# Patient Record
Sex: Male | Born: 1960
Health system: Southern US, Community
[De-identification: ages and names within clinical notes are randomized; demographics above are authoritative.]

## PROBLEM LIST (undated history)

## (undated) DIAGNOSIS — R011 Cardiac murmur, unspecified: Secondary | ICD-10-CM

## (undated) DIAGNOSIS — I10 Essential (primary) hypertension: Secondary | ICD-10-CM

## (undated) DIAGNOSIS — M1990 Unspecified osteoarthritis, unspecified site: Secondary | ICD-10-CM

## (undated) DIAGNOSIS — I4510 Unspecified right bundle-branch block: Secondary | ICD-10-CM

## (undated) DIAGNOSIS — N200 Calculus of kidney: Secondary | ICD-10-CM

## (undated) DIAGNOSIS — I219 Acute myocardial infarction, unspecified: Secondary | ICD-10-CM

## (undated) DIAGNOSIS — Z8719 Personal history of other diseases of the digestive system: Secondary | ICD-10-CM

## (undated) DIAGNOSIS — IMO0002 Reserved for concepts with insufficient information to code with codable children: Secondary | ICD-10-CM

## (undated) DIAGNOSIS — D352 Benign neoplasm of pituitary gland: Secondary | ICD-10-CM

## (undated) DIAGNOSIS — J45909 Unspecified asthma, uncomplicated: Secondary | ICD-10-CM

## (undated) DIAGNOSIS — R41841 Cognitive communication deficit: Secondary | ICD-10-CM

## (undated) DIAGNOSIS — K644 Residual hemorrhoidal skin tags: Secondary | ICD-10-CM

## (undated) DIAGNOSIS — K219 Gastro-esophageal reflux disease without esophagitis: Secondary | ICD-10-CM

## (undated) DIAGNOSIS — G43909 Migraine, unspecified, not intractable, without status migrainosus: Secondary | ICD-10-CM

## (undated) DIAGNOSIS — E785 Hyperlipidemia, unspecified: Secondary | ICD-10-CM

## (undated) DIAGNOSIS — D497 Neoplasm of unspecified behavior of endocrine glands and other parts of nervous system: Secondary | ICD-10-CM

## (undated) DIAGNOSIS — D369 Benign neoplasm, unspecified site: Secondary | ICD-10-CM

## (undated) DIAGNOSIS — T7840XA Allergy, unspecified, initial encounter: Secondary | ICD-10-CM

## (undated) DIAGNOSIS — K449 Diaphragmatic hernia without obstruction or gangrene: Secondary | ICD-10-CM

## (undated) DIAGNOSIS — I2510 Atherosclerotic heart disease of native coronary artery without angina pectoris: Secondary | ICD-10-CM

## (undated) DIAGNOSIS — K529 Noninfective gastroenteritis and colitis, unspecified: Secondary | ICD-10-CM

## (undated) DIAGNOSIS — K5790 Diverticulosis of intestine, part unspecified, without perforation or abscess without bleeding: Secondary | ICD-10-CM

## (undated) HISTORY — DX: Benign neoplasm of pituitary gland: D35.2

## (undated) HISTORY — DX: Acute myocardial infarction, unspecified: I21.9

## (undated) HISTORY — DX: Unspecified right bundle-branch block: I45.10

## (undated) HISTORY — PX: HAMMER TOE SURGERY: SHX385

## (undated) HISTORY — PX: HERNIA REPAIR: SHX51

## (undated) HISTORY — PX: OTHER SURGICAL HISTORY: SHX169

## (undated) HISTORY — DX: Personal history of other diseases of the digestive system: Z87.19

## (undated) HISTORY — DX: Diverticulosis of intestine, part unspecified, without perforation or abscess without bleeding: K57.90

## (undated) HISTORY — DX: Reserved for concepts with insufficient information to code with codable children: IMO0002

## (undated) HISTORY — DX: Gastro-esophageal reflux disease without esophagitis: K21.9

## (undated) HISTORY — DX: Diaphragmatic hernia without obstruction or gangrene: K44.9

## (undated) HISTORY — DX: Atherosclerotic heart disease of native coronary artery without angina pectoris: I25.10

## (undated) HISTORY — DX: Essential (primary) hypertension: I10

## (undated) HISTORY — PX: UPPER GASTROINTESTINAL ENDOSCOPY: SHX188

## (undated) HISTORY — DX: Unspecified asthma, uncomplicated: J45.909

## (undated) HISTORY — DX: Noninfective gastroenteritis and colitis, unspecified: K52.9

## (undated) HISTORY — DX: Migraine, unspecified, not intractable, without status migrainosus: G43.909

## (undated) HISTORY — DX: Calculus of kidney: N20.0

## (undated) HISTORY — DX: Hyperlipidemia, unspecified: E78.5

## (undated) HISTORY — DX: Benign neoplasm, unspecified site: D36.9

## (undated) HISTORY — DX: Cardiac murmur, unspecified: R01.1

## (undated) HISTORY — DX: Unspecified osteoarthritis, unspecified site: M19.90

## (undated) HISTORY — DX: Cognitive communication deficit: R41.841

## (undated) HISTORY — PX: TOOTH EXTRACTION: SUR596

## (undated) HISTORY — PX: SIGMOIDOSCOPY: SUR1295

## (undated) HISTORY — DX: Allergy, unspecified, initial encounter: T78.40XA

## (undated) HISTORY — PX: COLONOSCOPY: SHX174

## (undated) HISTORY — DX: Residual hemorrhoidal skin tags: K64.4

## (undated) HISTORY — DX: Neoplasm of unspecified behavior of endocrine glands and other parts of nervous system: D49.7

---

## 1998-11-05 ENCOUNTER — Emergency Department (HOSPITAL_COMMUNITY): Admission: EM | Admit: 1998-11-05 | Discharge: 1998-11-05 | Payer: Self-pay | Admitting: Emergency Medicine

## 1998-11-05 ENCOUNTER — Encounter: Payer: Self-pay | Admitting: Emergency Medicine

## 1999-11-09 ENCOUNTER — Emergency Department (HOSPITAL_COMMUNITY): Admission: EM | Admit: 1999-11-09 | Discharge: 1999-11-09 | Payer: Self-pay | Admitting: Emergency Medicine

## 2001-07-21 ENCOUNTER — Emergency Department (HOSPITAL_COMMUNITY): Admission: EM | Admit: 2001-07-21 | Discharge: 2001-07-21 | Payer: Self-pay | Admitting: Emergency Medicine

## 2001-07-21 ENCOUNTER — Encounter: Payer: Self-pay | Admitting: Emergency Medicine

## 2001-12-05 ENCOUNTER — Encounter: Payer: Self-pay | Admitting: Urology

## 2001-12-05 ENCOUNTER — Encounter: Admission: RE | Admit: 2001-12-05 | Discharge: 2001-12-05 | Payer: Self-pay | Admitting: Urology

## 2002-07-09 ENCOUNTER — Inpatient Hospital Stay (HOSPITAL_COMMUNITY): Admission: AD | Admit: 2002-07-09 | Discharge: 2002-07-10 | Payer: Self-pay | Admitting: *Deleted

## 2002-07-09 ENCOUNTER — Encounter: Payer: Self-pay | Admitting: *Deleted

## 2003-02-28 ENCOUNTER — Encounter: Payer: Self-pay | Admitting: Family Medicine

## 2003-02-28 ENCOUNTER — Encounter: Admission: RE | Admit: 2003-02-28 | Discharge: 2003-02-28 | Payer: Self-pay | Admitting: Family Medicine

## 2003-12-06 ENCOUNTER — Inpatient Hospital Stay (HOSPITAL_COMMUNITY): Admission: EM | Admit: 2003-12-06 | Discharge: 2003-12-07 | Payer: Self-pay | Admitting: Emergency Medicine

## 2004-06-18 ENCOUNTER — Encounter: Admission: RE | Admit: 2004-06-18 | Discharge: 2004-06-18 | Payer: Self-pay | Admitting: Family Medicine

## 2004-06-25 ENCOUNTER — Encounter: Admission: RE | Admit: 2004-06-25 | Discharge: 2004-06-25 | Payer: Self-pay | Admitting: Family Medicine

## 2004-11-15 ENCOUNTER — Encounter: Admission: RE | Admit: 2004-11-15 | Discharge: 2004-11-15 | Payer: Self-pay | Admitting: Family Medicine

## 2005-01-26 ENCOUNTER — Ambulatory Visit (HOSPITAL_COMMUNITY): Admission: RE | Admit: 2005-01-26 | Discharge: 2005-01-26 | Payer: Self-pay | Admitting: Endocrinology

## 2007-01-13 ENCOUNTER — Encounter: Admission: RE | Admit: 2007-01-13 | Discharge: 2007-01-13 | Payer: Self-pay | Admitting: Endocrinology

## 2007-10-17 ENCOUNTER — Observation Stay (HOSPITAL_COMMUNITY): Admission: EM | Admit: 2007-10-17 | Discharge: 2007-10-18 | Payer: Self-pay | Admitting: Emergency Medicine

## 2011-03-03 ENCOUNTER — Emergency Department (HOSPITAL_COMMUNITY): Payer: BC Managed Care – PPO

## 2011-03-03 ENCOUNTER — Observation Stay (HOSPITAL_COMMUNITY)
Admission: EM | Admit: 2011-03-03 | Discharge: 2011-03-05 | DRG: 813 | Disposition: A | Discharge status: Home / Self Care | Payer: BC Managed Care – PPO | Attending: Family Medicine | Admitting: Family Medicine

## 2011-03-03 DIAGNOSIS — K5289 Other specified noninfective gastroenteritis and colitis: Secondary | ICD-10-CM | POA: Insufficient documentation

## 2011-03-03 DIAGNOSIS — K633 Ulcer of intestine: Secondary | ICD-10-CM | POA: Insufficient documentation

## 2011-03-03 DIAGNOSIS — R109 Unspecified abdominal pain: Secondary | ICD-10-CM | POA: Insufficient documentation

## 2011-03-03 DIAGNOSIS — R197 Diarrhea, unspecified: Secondary | ICD-10-CM

## 2011-03-03 DIAGNOSIS — K2980 Duodenitis without bleeding: Principal | ICD-10-CM | POA: Insufficient documentation

## 2011-03-03 DIAGNOSIS — K922 Gastrointestinal hemorrhage, unspecified: Secondary | ICD-10-CM

## 2011-03-03 DIAGNOSIS — R51 Headache: Secondary | ICD-10-CM

## 2011-03-03 DIAGNOSIS — K449 Diaphragmatic hernia without obstruction or gangrene: Secondary | ICD-10-CM | POA: Insufficient documentation

## 2011-03-03 DIAGNOSIS — G43909 Migraine, unspecified, not intractable, without status migrainosus: Secondary | ICD-10-CM | POA: Insufficient documentation

## 2011-03-03 DIAGNOSIS — K649 Unspecified hemorrhoids: Secondary | ICD-10-CM | POA: Insufficient documentation

## 2011-03-03 DIAGNOSIS — K921 Melena: Secondary | ICD-10-CM | POA: Insufficient documentation

## 2011-03-03 DIAGNOSIS — Z01812 Encounter for preprocedural laboratory examination: Secondary | ICD-10-CM | POA: Insufficient documentation

## 2011-03-03 LAB — TYPE AND SCREEN: Antibody Screen: NEGATIVE

## 2011-03-03 LAB — ABO/RH: ABO/RH(D): A POS

## 2011-03-03 LAB — PROTIME-INR: Prothrombin Time: 12.9 seconds (ref 11.6–15.2)

## 2011-03-03 LAB — COMPREHENSIVE METABOLIC PANEL
Alkaline Phosphatase: 57 U/L (ref 39–117)
CO2: 27 mEq/L (ref 19–32)
Calcium: 9.3 mg/dL (ref 8.4–10.5)
GFR calc Af Amer: >60 mL/min (ref >60)
GFR calc non Af Amer: >60 mL/min (ref >60)
Sodium: 134 mEq/L — ABNORMAL LOW (ref 135–145)
Total Bilirubin: 0.3 mg/dL (ref 0.3–1.2)
Total Protein: 6.6 g/dL (ref 6.0–8.3)

## 2011-03-03 LAB — DIFFERENTIAL
Basophils Relative: 1 % (ref 0–1)
Monocytes Absolute: 1.2 10*3/uL — ABNORMAL HIGH (ref 0.1–1.0)
Monocytes Relative: 16 % — ABNORMAL HIGH (ref 3–12)
Neutro Abs: 4.7 10*3/uL (ref 1.7–7.7)

## 2011-03-03 LAB — CBC
Hemoglobin: 15.6 g/dL (ref 13.0–17.0)
MCH: 32.8 pg (ref 26.0–34.0)
MCHC: 36.3 g/dL — ABNORMAL HIGH (ref 30.0–36.0)

## 2011-03-03 MED ORDER — IOHEXOL 300 MG/ML  SOLN
100.0000 mL | Freq: Once | INTRAMUSCULAR | Status: AC | PRN
  Administered 2011-03-03: 100 mL via INTRAVENOUS

## 2011-03-04 LAB — CBC
MCHC: 34.5 g/dL (ref 30.0–36.0)
Platelets: 200 10*3/uL (ref 150–400)
RDW: 12.9 % (ref 11.5–15.5)
WBC: 6.3 10*3/uL (ref 4.0–10.5)

## 2011-03-04 LAB — URINALYSIS, ROUTINE W REFLEX MICROSCOPIC
Bilirubin Urine: NEGATIVE
Glucose, UA: NEGATIVE mg/dL
Nitrite: NEGATIVE
Specific Gravity, Urine: 1.02 (ref 1.005–1.030)
pH: 7 (ref 5.0–8.0)

## 2011-03-04 LAB — HEMOCCULT GUIAC POC 1CARD (OFFICE)
Fecal Occult Bld: POSITIVE
Fecal Occult Bld: POSITIVE

## 2011-03-04 LAB — COMPREHENSIVE METABOLIC PANEL
ALT: 34 U/L (ref 0–53)
Albumin: 2.9 g/dL — ABNORMAL LOW (ref 3.5–5.2)
Calcium: 9 mg/dL (ref 8.4–10.5)
GFR calc Af Amer: >60 mL/min (ref >60)
Glucose, Bld: 100 mg/dL — ABNORMAL HIGH (ref 70–99)
Sodium: 135 mEq/L (ref 135–145)
Total Protein: 6.2 g/dL (ref 6.0–8.3)

## 2011-03-04 LAB — SEDIMENTATION RATE: Sed Rate: 23 mm/hr — ABNORMAL HIGH (ref 0–16)

## 2011-03-04 LAB — CLOSTRIDIUM DIFFICILE BY PCR: Toxigenic C. Difficile by PCR: NEGATIVE

## 2011-03-05 ENCOUNTER — Other Ambulatory Visit: Payer: Self-pay | Admitting: Gastroenterology

## 2011-03-05 HISTORY — PX: FLEXIBLE SIGMOIDOSCOPY: SHX1649

## 2011-03-05 HISTORY — PX: OTHER SURGICAL HISTORY: SHX169

## 2011-03-05 LAB — BASIC METABOLIC PANEL
Chloride: 101 mEq/L (ref 96–112)
Creatinine, Ser: 0.68 mg/dL (ref 0.4–1.5)
GFR calc Af Amer: >60 mL/min (ref >60)
GFR calc non Af Amer: >60 mL/min (ref >60)
Potassium: 3.7 mEq/L (ref 3.5–5.1)

## 2011-03-05 LAB — CBC
HCT: 41 % (ref 39.0–52.0)
MCH: 30.9 pg (ref 26.0–34.0)
MCHC: 34.1 g/dL (ref 30.0–36.0)
MCV: 90.5 fL (ref 78.0–100.0)
Platelets: 215 10*3/uL (ref 150–400)
RBC: 4.43 MIL/uL (ref 4.22–5.81)
RDW: 12.6 % (ref 11.5–15.5)
WBC: 8.2 10*3/uL (ref 4.0–10.5)

## 2011-03-05 LAB — POCT I-STAT, CHEM 8
BUN: 9 mg/dL (ref 6–23)
Creatinine, Ser: 0.9 mg/dL (ref 0.4–1.5)
Hemoglobin: 15.6 g/dL (ref 13.0–17.0)
Potassium: 3.7 mEq/L (ref 3.5–5.1)
Sodium: 135 mEq/L (ref 135–145)

## 2011-03-07 LAB — STOOL CULTURE

## 2011-03-16 NOTE — H&P (Signed)
NAME:  Michael Pearson, Michael Pearson NO.:  [id number]   MEDICAL RECORD NO.:  [id number]          PATIENT TYPE:  EMS   LOCATION:  MAJO                         FACILITY:  [facility]   PHYSICIAN:  Hollice Espy, M.D.DATE OF BIRTH:  November 13, 1960   DATE OF ADMISSION:  10/17/2007  DATE OF DISCHARGE:                              HISTORY & PHYSICAL   PRIMARY CARE PHYSICIAN:  Joycelyn Rua, M.D.   CHIEF COMPLAINT:  Headache and right arm weakness.   HISTORY OF PRESENT ILLNESS:  Patient is a 50 year old white male with a  past medical history of obesity and hypertension who states he has been  in previously good health, taking his medications.  Blood pressure well  controlled.  Starting yesterday, he had an episode of a headache as well  as some chest pain.  The chest pain was located mid sternal, described  as sharp and stabbing.  He had some associated right arm numbness and  mild weakness.  He said after a few minutes, it went away, so he did not  come in and have it treated.  Today he woke up this morning, had more  chest pain, sharp and stabbing, and continued to have this right arm  numbness and weakness.  The chest pain stabbing went away, but his arm  pain persisted.  He has also noted intermittent headaches.  The  headaches have been described specifically, as he gets them starting on  the right side of his head, traveling to the front.  He can feel them  mostly in the frontal area.  One time he had an episode of blood coming  from his right ear.  He has also, according to his brother and the  patient, whenever he gets headaches, the sclerae of his eyes turn cherry  red.  In addition, he notes no other neurological symptoms, no other  extremity weakness or numbness.  He denies any visual changes at this  time.  No dysphagia.  No palpitations, no shortness of breath, wheeze,  cough, no abdominal pain, no hematuria, dysuria, constipation, or  diarrhea.  No other focal extremity  numbness, weakness, or pain.  Review  of systems otherwise negative.   Patient had a CT scan of his head, EKG, and lab work done, all within  normal limits.   Blood pressure on admission was 174/82 and has remained stable with his  diastolic actually dropping down to 69.   Patient's past medical history includes hypertension.  He had a negative  cardiac cath done two years ago.  He has had a full, extensive headache  workup, and he said he had an exposure, which required hospitalization  to chemical battery acid and has had headaches ever since, and this was  several years ago.   MEDICATIONS:  1. Atenolol/chlorthalidone 1.5 pills p.o. daily.  2. Aspirin p.o. daily.   He has no known drug allergies.   SOCIAL HISTORY:  No tobacco, alcohol, or drug use.   FAMILY HISTORY:  Noncontributory.   PHYSICAL EXAMINATION:  VITALS ON ADMISSION:  Temp 98.1, heart rate 58,  blood pressure 124/82, now down to 111/69, respirations 22, O2 sat 97%  on 2 liters.  GENERAL:  He is alert and oriented in no apparent distress.  HEENT:  Normocephalic and atraumatic.  His mucous membranes are slightly  dry.  He has no carotid bruits.  His cranial nerves II-XII are intact;  however, he has difficulty shrugging his shoulders, which appears to be  more subjective than anything else.  It is more bilateral.  HEART:  Regular rate and rhythm.  S1 and S2.  LUNGS:  Clear to auscultation bilaterally.  ABDOMEN:  Soft, nontender, nondistended.  Positive bowel sounds.  EXTREMITIES:  No clubbing, cyanosis or edema.  NEUROLOGIC/MUSCULOSKELETAL:  He has no evidence of any cerebellar  dysfunction.  His coordination is excellent and symmetric.  He has  bilateral upgoing toes but appears to be the whole foot rather than just  specifically the toes, in terms of Babinski's sign.  His sensation is  fully intact on both upper and lower extremities.  In terms of grip, he  is gripping a 5/5 on the left.  It appears to be a  -4/5 but again, more  subjective than anything else.  When asked to point, in terms of flexion  and extension, again, he has significant weakness on the right-hand  side; however, this is only in exam strength testing.  When I asked him  to point to where his headache is, he is easily able to without any type  of effort, point to using his right hand.  His lower extremities are  fully symmetric, intact, and equal.   LAB WORK:  CT scan of the head and chest x-ray are completely normal.   Sodium 141, potassium 2.9, chloride 102, bicarb 28, BUN 14, creatinine  0.8, glucose 94.  CBC is pending.  UA negative.  Cardiac markers are  normal.   EKG:  Normal.   ASSESSMENT:  1. Left arm weakness.  2. Chest pain.  3. Headache.  4. Hypertension.  5. Hypokalemia.   PLAN:  1. This exam is completely inconsistent with a TIA or CVA, given that      all of his complaints are on the right-hand side.  He has      completely normal blood pressure.  He has no signs of any permanent      dysfunction.  I suspect that he is either having a migraine with      aura or this may be a conversion disorder.  We will plan and treat      with IV fluids, Tylenol for pain control, observation, and      discharge in the morning.  I do not see any benefit from getting a      CT scan, as again, he is having no consistent neurological      dysfunction.  2. Chest pain:  He had a negative cardiac catheterization two years      ago and normal EKG and enzymes.  No reason to suspect he has any      acute disease.  3. Hypertension:  Currently, his blood pressure is excellent on      admission.  Discontinue outpatient medication.  4. Hypokalemia:  Will replace.      Hollice Espy, M.D.  Electronically Signed    SKK/MEDQ  D:  10/17/2007  T:  10/17/2007  Job:  [id number]   cc:   Joycelyn Rua, M.D.

## 2011-03-17 NOTE — Discharge Summary (Signed)
NAME:  Michael Pearson, Michael Pearson NO.:  [id number]  MEDICAL RECORD NO.:  [id number]           PATIENT TYPE:  E  LOCATION:  [facility]                         FACILITY:  [facility]  PHYSICIAN:  Nestor Ramp, MD        DATE OF BIRTH:  [birth year]  DATE OF ADMISSION:  03/03/2011 DATE OF DISCHARGE:  03/03/2011                              DISCHARGE SUMMARY   PRIMARY CARE PROVIDER:  [facility].  REASON FOR HOSPITALIZATION:  Diarrhea, Hemoccult positive stools and evidence of pancolitis on CT of abdomen and pelvis.  DISCHARGE DIAGNOSES: 1. Mild duodenitis. 2. Colitis with scattered ulcerations. 3. Migraine headache.  CONSULTS:  Jordan Hawks. Elnoria Howard, MD, gastroenterologist with [facility].  PROCEDURES: 1. EGD on Mar 05, 2011 showed a mild hiatal hernia and mild duodenitis. 2. Flexible sigmoidoscopy on Mar 05, 2011 showed colitis with scattered     ulcerations and median hemorrhoids. 3. CT of abdomen and pelvis showed pancolitis in the pattern that is     typical of pseudomembranous colitis, although ulcerative colitis     and other infectious colitis may also have a similar appearance.  PERTINENT LABS:  Hemoglobin on admission 15.6, on day #1 14.2 and on day #2 14.0, white blood count on admission 7.3.  Hemoccult positive. Creatinine 0.98 on admission and 0.61 on day #1.  C. diff PCR negative.  BRIEF HOSPITAL COURSE:  This is a 50 year old male with a history of atypical migraines without aura and medication overdose headaches and status post hiatal hernia repair as a child who presents with diarrhea, Hemoccult positive stools, headache, and a CT showing pancolitis. 1. Diarrhea, Hemoccult positive stools were likely secondary to     duodenitis and colitis with ulcerations as shown on EGD and     sigmoidoscopy.  The patient presented with his presenting symptoms,     which he has had for the past several days.  On speaking with his     primary care provider at  [facility], the patient had come     in with similar episode of diarrhea about a month ago that resolved     spontaneously with conservative management.  In March 2011     colonoscopy done by Dr. Elnoria Howard at [facility] was     abnormal for diverticulosis, sessile polyps, and medium     hemorrhoids.  Dr. Elnoria Howard was asked to consult on the patient and he     performed an EGD and flexible sigmoidoscopy on Mar 05, 2011, which     was significant for mild duodenitis and colitis with scattered     ulcerations.  For the duodenitis, the patient was put on high-dose     proton pump inhibitors and was recommended to have H.  pylori     testing as an outpatient.  Regarding the colitis with ulcerations,     biopsies were obtained and the patient was asked to follow up on     these biopsies as an outpatient with Dr. Elnoria Howard.  During his     hospitalization, the patient was  also put on a 5-day course of     prednisone for possible inflammatory colitis.  The patient's     symptoms of diarrhea improved during his hospitalization.  The    patient was initially made n.p.o. and then started on the clear     diet and his diet was advanced as tolerated.  At the time of     discharge, the patient was tolerating a regular diet.  The patient     was initially put on ciprofloxacin and Flagyl due to concern for     bacterial gastroenteritis.  The Flagyl was discontinued when the C.     diff PCR came back negative.  The ciprofloxacin was also     discontinued following the results of the patient's scope studies. 2. Headaches.  The patient was complaining of worsening headache on     admission.  The patient has been worked up extensively for     headaches in the past.  Speaking with his PCP, the patient sees a     physician at [facility] who was diagnosed the     patient with atypical migraines without aura and medication     overdose headaches.  These are unclear what the  patient takes as an     outpatient for his headaches.  The patient is a poor historian and     may have baseline mental retardation and may have high-functioning     mental retardation.  In the hospital, the headaches were alleviated     with p.r.n. Vicodin, which the patient was only requiring about     once daily.  According to his PCP, the patient was initially taking     topiramate and Flexeril for his headaches, but the patient denies     taking anything at home for his headaches besides occasional Advil     and Goody's Powder.  The patient's headache at the time of     discharge was minimal.  MEDICATIONS:  New medications: 1. Acetaminophen 650 mg p.o. b.i.d. p.r.n. headaches. 2. Protonix 40 mg p.o. b.i.d. 3. Prednisone taper to be continued for four more days, two tablets x2     days, one tablet x2 days, and discontinue. 4. Topiramate 100 mg p.o. daily. 5. Multivitamins.  DISCONTINUED MEDICATIONS:  Aspirin 81 mg p.o. daily.  DISCHARGE INSTRUCTIONS:  The patient was asked to follow up with his PCP at [facility] in 2-3 weeks.  The patient was asked to make an appointment.  The patient was also asked to follow up with Dr. Elnoria Howard at [facility] who will call the patient to arrange follow up with him.  The patient was also asked to discontinue his aspirin and discontinued NSAIDs for the treatment of headaches in light of his duodenitis.  The patient was asked to resume a normal diet..  The patient was discharged home in stable medical condition.    ______________________________ Michael Mann, MD   ______________________________ Nestor Ramp, MD    AO/MEDQ  D:  03/06/2011  T:  03/07/2011  Job:  [id number]  Electronically Signed by Michael Mann MD on 03/16/2011 09:06:46 PM Electronically Signed by Michael Levy MD on 03/17/2011 04:36:00 PM

## 2011-03-19 NOTE — H&P (Signed)
NAME:  Michael Pearson, Michael Pearson                             ACCOUNT NO.:  [id number]   MEDICAL RECORD NO.:  [id number]                   PATIENT TYPE:  INP   LOCATION:  6524                                 FACILITY:  [facility]   PHYSICIAN:  Jackie Plum, M.D.             DATE OF BIRTH:  1961/10/05   DATE OF ADMISSION:  12/06/2003  DATE OF DISCHARGE:                                HISTORY & PHYSICAL   CHIEF COMPLAINT:  Severe headaches.   HISTORY OF PRESENT ILLNESS:  The patient is a 50 year old Caucasian  gentleman with a history of hypertension and dyslipidemia, who presented to  the ED last evening with severe headache while watching TV.  Headache was  graded 10/10 and was said to the be the worst of his life.  It was  associated with nausea and vomiting x2.  He has had similar headaches which  have been more recurrent over the last 6 months.  He had apparently been  diagnosed with migraine headaches.  Headaches were mainly in his right  frontal and parietal and temporal areas.  They were said to be sharp and  associated with some form of mild photophobia with flashing of his eyes and  clutching involving his right upper face.  According to the family, per ED  P.A. who saw the patient, the patient's right eye was deviated at the time  of the headache and his gait was very abnormal at the time, he could not  even walk and this was even so at the time of ED evaluation by the P.A.  No  history of fever, chills, muscle aches, sore throat or nasal congestion or  sinus congestion, cough, sputum production or shortness of breath, abdominal  pain, diarrhea, dysuria or frequency of micturition.  He had associated  chest pain for about 15 minutes which was mainly in his sternal area,  nonradiating, sharp, graded about moderate in intensity and was resolved at  time of ED evaluation.   PAST MEDICAL HISTORY:  Past medical history is notable for:  1. Dyslipidemia.  2. Coronary artery disease, status post  cardiac cath by Dr. Meade Maw of     cardiology in September 2003 which noted for luminal irregularities with     worst disease identified as a 30% ostial lesion in the right coronary     artery.  Preserved left ventricular function.  EF of 65%.  3. He also has a history of gastroesophageal reflux disease.  4. Hypertension.  5. Diagnosed with migraine headaches.  6. He has kidney stones.   MEDICATIONS:  Specific medicines are unclear but apparently he takes  Prilosec, an antihypertensive pill and a pill for diabetes as well as other  medication for his headaches.   ALLERGIES:  He has been intolerant to ANESTHESIA previously.   FAMILY HISTORY:  Family history is notable for history of heart disease and  anemia, no history  of cerebral aneurysm or migraines.   SOCIAL HISTORY:  He does not smoke cigarettes nor drink alcohol.   REVIEW OF SYSTEMS:  Significant positives and negative are as in HPI, but  otherwise unremarkable.   PHYSICAL EXAMINATION:  VITAL SIGNS:  BP was 158/68, temperature of 98.0  degrees Fahrenheit, pulse of 78, respiratory rate of 22 and O2 saturation of  97% on oxygen 3 L per minute by nasal cannula.  GENERAL EXAMINATION:  He was not in acute cardiopulmonary distress, in fact,  his headaches have come down from 10/10 to about 3-4/10 at time of my  evaluation.  CNS:  Alert and oriented x3, no acute focal deficits.  Pupils were equal,  round and reactive to light.  Extraocular movements were intact.  Power and  sensory were within normal limits.  His gait was normal on my exam (gait was  felt to be abnormal during initial ED evaluation).  (Initial ED evaluation  with abnormal Romberg test.)  No obvious focal deficits were appreciated by  me.  HEENT:  Normocephalic, atraumatic.  Oropharynx was moist without any  exudation or erythema.  NECK:  Neck was supple, no JVD.  LUNGS:  Lungs were clear to auscultation.  CARDIAC:  Exam revealed a regular rate and rhythm  without any gallops or  murmur.  ABDOMEN:  Abdomen was obese, nontender.  Bowel sounds were present.  SKIN:  Skin was dry and warm without any rashes.   LABORATORY WORK:  Chest x-ray was negative for any acute infiltrates.   CT of the head:  Official result pending, preliminary report was negative  for any acute changes.   Twelve-lead EKG compared to EKG of September 2003 notable for lateral T wave  inversions with right bundle branch block.   Blood work:  WBC of 6.6, CBC was essentially within normal limits.  Sodium  was 136, potassium 3.0, chloride 132.  BMET essentially within normal limits  except for potassium 3.0 and glucose of 121.  CK-MB was within normal  limits.  Troponin I was less than 0.05 x3.  Myoglobin was within normal  limits x3.  CSF of tube 1 was colorless, clear, WBCs of 2 per cubic  millimeter, RBC of 1 per cubic millimeter, segmented neutrophils too few to  count,  glucose of 77, total protein of 67.  Tube 4 was colorless, clear,  WBC of 1, RBC of 0.   IMPRESSION:  1. Complex migraine.  Cannot rule out transient ischemic attack/stroke     involving the posterior circulation.  2. Transient chest pain.   PLAN OF CARE:  The patient will be admitted for observation until pain  controlled and antiemetics.  We will obtain MRI of the brain to rule out any  posterior circulation abnormalities.  The patient may have a complex  migraine or vascular headaches which have improved.  The patient may also  benefit from an outpatient stress test if indicated.  The patient's son is  to bring medications this morning to be clarified per name of medicines as  well as dosages.                                               Jackie Plum, M.D.    GO/MEDQ  D:  12/06/2003  T:  12/06/2003  Job:  [id number]   cc:   Joycelyn Rua, M.D.  [address] [place]  [place] [address]  Fax: [phone]

## 2011-03-19 NOTE — Discharge Summary (Signed)
NAME:  Michael Pearson                             ACCOUNT NO.:  [id number]   MEDICAL RECORD NO.:  [id number]                   PATIENT TYPE:  INP   LOCATION:  6524                                 FACILITY:  [facility]   PHYSICIAN:  Sherin Quarry, MD                   DATE OF BIRTH:  Mar 30, 1961   DATE OF ADMISSION:  12/05/2003  DATE OF DISCHARGE:                                 DISCHARGE SUMMARY   HISTORY:  Michael Pearson is a 50 year old man with a past history of recurrent  headaches and mild hypertension who was initially admitted on February 3,  with complaints of severe headache.  In the emergency room, he underwent  extensive testing which included a CT scan of the brain and lumbar puncture  which was remarkable only for a total protein of 67 which is slightly  elevated.  No cells were seen.  Gram stain was negative and cultures have  been negative.   LABORATORY DATA:  Laboratory studies obtained in the emergency room revealed  potassium of 3.0, but these were otherwise unremarkable.  Because of concern  about the persistence of the headache, it was felt prudent to admit the  patient to the hospital.   PHYSICAL EXAMINATION:  As performed by Dr. Julio Sicks at the time of  admission was remarkable for a normal neurological examination.   Subsequently, the patient underwent an MRI scan of the brain which was  entirely normal.  The headache gradually resolved.  By February 5, he  reported that he was feeling essentially well.  It was therefore felt  reasonable to discharge him.   DISCHARGE DIAGNOSIS:  Presumed migraine headache with extensive negative  workup.   DISCHARGE MEDICATIONS:  The patient was instructed to continue his usual  medicines which consist of:  1. Prevacid 30 mg daily.  2. Lipitor 10 mg daily.  3. Aspirin 81 mg daily.  4. Multivitamins.  5. Hydrochlorothiazide 25 mg daily.  6. In addition, he will take K-Dur 20 mEq daily.   FOLLOWUP:  I advised him to return to see  Dr. Izola Price in followup in 10-14  days.   DISCHARGE CONDITION:  Good.   RECOMMENDATIONS:  I advised him that he should be reassured that the  extensive evaluation that has been performed was not diagnostic, although  the explanation for his headaches was not found with certainty.                                                Sherin Quarry, MD    SY/MEDQ  D:  12/07/2003  T:  12/08/2003  Job:  [id number]   cc:   Joycelyn Rua, M.D.  [address] [place]  [place] [address]  Fax:  [phone] 

## 2011-03-19 NOTE — Cardiovascular Report (Signed)
NAME:  Michael Pearson, Michael Pearson                             ACCOUNT NO.:  [id number]   MEDICAL RECORD NO.:  [id number]                   PATIENT TYPE:  INP   LOCATION:  3714                                 FACILITY:  [facility]   PHYSICIAN:  Helen A. Fraser Din, M.D.              DATE OF BIRTH:  1961/01/23   DATE OF PROCEDURE:  07/10/2002  DATE OF DISCHARGE:                              CARDIAC CATHETERIZATION   INDICATIONS FOR PROCEDURE:  Reproducible chest pain with nausea and vomiting  on stress Cardiolite.  Cardiolite portion revealing decreased uptake in the  inferior basilar region. The rest pictures were not performed.   DESCRIPTION OF PROCEDURE:  After obtaining written informed consent, the  patient was brought to the cardiac catheterization laboratory in the  postabsorptive state. Preoperative sedation was achieved using IV Versed.  The right groin was prepped and draped in the usual sterile fashion. Local  anesthesia was achieved using 1% Xylocaine. A 6 French hemostasis sheath was  placed into the right femoral artery using the modified Seldinger technique.  Selective coronary angiography was performed using JL4, JR4 Judkins  catheter. Multiple views were obtained.  All catheter exchanges were made  over a guide wire.   FINDINGS:  The aortic pressure is 114/73, LV pressure is 114.  No gradient  noted on pullback.  Single plane ventriculogram revealed normal wall motion.  There was no mitral regurgitation noted. The estimated ejection fraction is  65%.   CORONARY ANGIOGRAPHY:  Left main coronary artery:  The left main coronary artery bifurcates into  the left anterior descending and circumflex vessels. There is no disease in  the left main coronary artery or its branches.   Left anterior descending:  The left anterior descending gives rise to a  small diagonal #1, small diagonal #2, a large diagonal #3, goes on to end as  an apical recurrent branch. There is no significant disease in the  left  anterior descending or its branches.   Circumflex vessel:  The circumflex vessel is a large vessel, gives rise to a  small OM-1, a very large trifurcating OM-2 and then goes on to end as an AV  groove vessel. There is no disease in the circumflex or its branches.   Right coronary artery:  The right coronary artery is a large artery. There  is luminal irregularities in the ostial portion. The right coronary artery  gives rise to two RV marginals, a PDA and a PL branch.  There is narrowing  of the mid vessel of the right coronary artery, but no obstructive lesion is  identified.   FINAL IMPRESSION:  1. Luminal irregularities noted only with the worst disease identified as a     30% ostial lesion in the right coronary     artery.  2. Preserved left ventricular function.  Estimated ejection fraction of 65%.  3. Other etiologies of his chest pain  should be considered.                                                   Helen A. Fraser Din, M.D.    HAP/MEDQ  D:  07/10/2002  T:  07/11/2002  Job:  [id number]

## 2011-03-19 NOTE — H&P (Signed)
NAME:  Michael Pearson, Michael Pearson                             ACCOUNT NO.:  [id number]   MEDICAL RECORD NO.:  [id number]                   PATIENT TYPE:  INP   LOCATION:  3714                                 FACILITY:  [facility]   PHYSICIAN:  Helen A. Fraser Din, M.D.              DATE OF BIRTH:  Dec 19, 1960   DATE OF ADMISSION:  07/09/2002  DATE OF DISCHARGE:                                HISTORY & PHYSICAL   PRIMARY CARE Shanetha Bradham:  Dr. Alanson Aly. Lenise Arena at [facility].   IMPRESSION:  (As dictated by Dr. Lemont Fillers. Preston.)  1. Chest discomfort in this 50 year old male with positive risk factors for     coronary artery disease including hypertension, dyslipidemia, obesity and     positive family history for coronary artery disease.  The patient is     referred today for a stress Cardiolite; during exercise, he developed     chest discomfort without significant electrocardiographic changes.  In     recovery, he became hypotensive, diaphoretic.  His Cardiolite images     revealed inferior defect with preserved ejection fraction.  2. Dyslipidemia, on Lipitor, management per primary care.  3. History of gastroesophageal reflux disease, well-controlled on Prilosec.  4. History of hypertension, good control today.  5. History of migraine headaches; CT scan approximately two weeks earlier by     primary care was negative.  6. Obesity.  7. History of nephrolithiasis.   PLAN:  (As dictated by Dr. Hillary Bow.)  1. The patient is to be admitted to telemetry.  We will obtain two sets of     cardiac enzymes to rule out MI or myocardial stress.  2. Aspirin and p.r.n. sublingual nitrates.  3. Baseline EKG and chest x-ray.  4. Institute coronary angiography with possible percutaneous intervention     scheduled for tomorrow morning, Dr. Hillary Bow.  Risks, potential     benefits, potential complications and alternatives to procedure were     discussed in detail.  The patient and his  family indicated their     questions and concerns have been addressed and are agreeable to proceed.   HISTORY OF PRESENT ILLNESS:  The patient is a very pleasant 50 year old  gentleman with history of hypertension, dyslipidemia and positive family  history of CAD, as his dad died of an MI at age 16.  Two months earlier, he  had an episode of syncope bending over while at work.  Since then, he will  have episodic diaphoresis with lightheadedness when he has position changes.  EKG by primary M.D. revealed some baseline abnormality.  He had been  complaining of atypical chest discomfort.  The patient was sent to our  office for a stress Cardiolite.  During treadmill aspect of test, he did  develop anterior chest discomfort without significant EKG changes, recovery  complicated by episode of hypotension and diaphoresis.  Cardiolite images  revealed preserved EF, negative ischemia without inferior defect.  He is now  electively admitted for anticipated cardiac catheterization tomorrow  morning.   PREVIOUS MEDICAL HISTORY:  As detailed above.   PREVIOUS SURGICAL HISTORY:  Surgical correction of bilateral hammertoes.   ALLERGIES:  The patient states he had renal failure in the past associated  with ANESTHESIA.  He is okay with seafood, shellfish and iodinated products.   MEDICATIONS:  1. Multivitamin.  2. Lipitor 5 mg p.o. q.d.  3. Blood pressure pill.  4. Bayer Aspirin 81 mg a day.  5. Prilosec, which he believes is 20 mg a day.   SOCIAL HISTORY AND HABITS:  He is single without children.  He lives with  his brother and mother.  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   FAMILY HISTORY:  Mother is age 26 and has anemia.  Father died at age 66 of  MI.  Brother, age 61, obese, but alive and well.   REVIEW OF SYSTEMS:  As in HPI/previous medical history, otherwise, complains  of lower extremity edema, dependent in  nature, from standing on his job long  periods of time.  Does get easily short-winded.  Complains of episodic  palpitations exacerbated with stress.  Negative dysphagia to food or fluid.  History of GERD, well-controlled with Prilosec.  Negative for constipation,  diarrhea, melena nor bright red blood PR.  Negative history of stomach  ulcers.  Negative dysuria nor hematuria.   PHYSICAL EXAMINATION:  (As dictated by Dr. Hillary Bow.)  VITAL SIGNS:  Blood pressure is 128/88, heart rate 76 and regular.  He is 6  feet 3 inches.  GENERAL:  He is an obese, pleasantly conversant, middle-aged male looking  older than his 41 years.  His brother and mother are in attendance.  HEENT/NECK:  Brisk bilateral carotid upstroke without bruit.  No significant  JVD nor thyromegaly.  CHEST:  Lung sounds clear with equal bilateral excursion.  Negative CVA  tenderness.  CARDIAC:  Regular rate and rhythm without murmur, rub nor gallop.  Normal S1  and S2.  ABDOMEN:  Obese, soft, protuberant.  Normoactive bowel sounds.  Negative  abdominal aortic, renal, nor femoral bruit.  Nontender to applied pressure,  though difficult exam secondary to obesity.  EXTREMITIES:  Difficult to appreciate femoral pulses.  Unable to appreciate  posterior tibial.  Good dorsalis pedis.  Negative pedal edema.  GENITAL AND RECTAL:  Exams deferred.   LABORATORY TESTS AND DATA:  EKG, chest x-ray, CBC, CMET and coagulations are  pending.     Salomon Fick, N.P.                       Lemont Fillers Fraser Din, M.D.    MES/MEDQ  D:  07/09/2002  T:  07/10/2002  Job:  [id number]   cc:   Alanson Aly. Lenise Arena, M.D.

## 2011-03-22 NOTE — Consult Note (Signed)
NAME:  Michael Pearson, Michael Pearson NO.:  [id number]  MEDICAL RECORD NO.:  [id number]           PATIENT TYPE:  E  LOCATION:  [facility]                         FACILITY:  [facility]  PHYSICIAN:  Jordan Hawks. Elnoria Howard, MD    DATE OF BIRTH:  December 10, 1960  DATE OF CONSULTATION:  03/04/2011 DATE OF DISCHARGE:  03/03/2011                                CONSULTATION   REASON FOR CONSULTATION:  Pancolitis.  PRIMARY CARE PROVIDER:  Stan Head. Daub, MD  HISTORY OF PRESENT ILLNESS:  This is a 50 year old gentleman with a past medical history of migraine headaches and heme-positive stool who was admitted to the hospital with complaints of headaches, abdominal pain, bloody diarrhea and dysphagia.  The patient states that his symptoms started this past Saturday.  Initially, he started having issues with headaches and subsequently started having bloody diarrhea.  As a result of his symptoms, he presented to the emergency room for further evaluation and treatment.  CT scan of the abdomen was performed and was noted that he had a pancolitis which could be consistent with a C diff etiology.  C diff PCR was performed and the result was negative for C diff.  As a result of his complaints a GI consultation was requested for further evaluation and treatment.  PAST MEDICAL HISTORY:  As stated above.  PAST SURGICAL HISTORY:  As stated above.  FAMILY HISTORY:  Noncontributory.  SOCIAL HISTORY:  Negative for alcohol, tobacco or illicit drug use.  REVIEW OF SYSTEMS:  As stated above in history of present illness, otherwise negative.  MEDICATIONS: 1. Ciprofloxacin 500 mg p.o. b.i.d. 2. Protonix 80 mg p.o. daily. 3. Prednisone 60 mg p.o. daily. 4. Tylenol 650 mg p.o. b.i.d. p.r.n. 5. Dilaudid 1 mg p.o. q.6 h. p.r.n. 6. Zofran 4 mg IV q.6 h. p.r.n.  ALLERGIES:  No known drug allergies.  PHYSICAL EXAMINATION:  VITAL SIGNS:  Blood pressure is 138/84, heart rate 71, respirations 20, temperature is  97.6. GENERAL:  The patient is in no acute distress, alert and oriented. HEENT:  Normocephalic, atraumatic.  Extraocular muscles intact. NECK:  Supple.  No lymphadenopathy. LUNGS:  Clear to auscultation bilaterally. CARDIOVASCULAR:  Regular rate and rhythm. ABDOMEN:  Obese, soft, nontender, nondistended.  Positive bowel sounds. EXTREMITIES:  No clubbing, cyanosis or edema.  LABORATORY VALUES:  White blood cell count 6.3, hemoglobin 14.2, platelets 200.  ESR is 23.  Sodium 135, potassium 2.4, chloride 100, CO2 26, glucose 100, BUN 9, creatinine 0.6.  Total bilirubin is 0.4, alk phos is 62, AST 25, ALT 34, albumin is at 2.9, hemoglobin A1c is 5.9.  IMPRESSION: 1. Pancolitis. 2. Bloody diarrhea. 3. History of migraine headaches. 4. Dysphagia.  The patient did have a colonoscopy performed by me on December 30, 2009.  It was performed for findings of heme-positive stool.  At that time, he had an excellent prep and a 3-mm sessile polyp was identified in the descending colon as well as some diverticula.  The polyp pathology revealed that was a tubular adenoma.  Subsequently, he has not had any further problems until this time.  With  regards to current findings, a flexible sigmoidoscopy will be acceptable in order to help identifying the situation with his pancolitis issue.  Furthermore, after more discussion with the patient he did complain having some dysphagia problems that has been ongoing for a number of months.  Because of this issue, an EGD will also be performed for further evaluation.  Plan is for EGD and flexible sigmoidoscopy tomorrow and further recommendations pending the findings.     Jordan Hawks Elnoria Howard, MD     PDH/MEDQ  D:  03/04/2011  T:  03/05/2011  Job:  [id number]  cc:   Brett Canales A. Cleta Alberts, M.D.  Electronically Signed by Jeani Hawking MD on 03/22/2011 06:48:46 AM

## 2011-04-14 NOTE — H&P (Signed)
NAME:  Michael Pearson, Michael Pearson NO.:  [id number]  MEDICAL RECORD NO.:  [id number]           PATIENT TYPE:  E  LOCATION:  [facility]                         FACILITY:  [facility]  PHYSICIAN:  Nestor Ramp, MD        DATE OF BIRTH:  Sep 09, 1961  DATE OF ADMISSION:  03/03/2011 DATE OF DISCHARGE:                             HISTORY & PHYSICAL   PRIMARY CARE PROVIDER:  [facility].  CHIEF COMPLAINT:  Headache, abdominal pain, GI bleed.  HISTORY OF PRESENT ILLNESS:  This is 50 year old male with past medical history significant for recurrent migraine-type headache presenting with a 4-day history of diarrhea, recurrent GI bleeding, as well as headache. The patient is noted to have baseline migraine, cluster-type migraines which occur on intermittent base with approximately 1-2 episodes per month.  The patient states that he has had migraine headache flare for approximately 4 days in a row prior to admission.  The patient also states that diarrhea and GI bleeding started at the same time with this migraine flare.  The patient does report subjective fevers and chills as well as intermittent abdominal pain, nausea, and vomiting during the same time.  Emesis is nonbloody, nonbilious.  The patient does take a baby aspirin daily.  He is also taking 1-2 Goody powders over the last 4 days with moderate improvement in abdominal pain and headache.  The patient denies any history of reflux, he is not on a PPI.  The patient has noticed intermittent dark maroon stools since onset of abdominal pain and headache.  The patient also reports 4-5 voluminous bowel movements per day and with patient having up to 7-8 bowel movements over the last 24 hours prior to admission.  No recent sick contacts per patient.  No temporal artery tenderness.  No recent antibiotic use.  No history of diverticular disease per patient.  No history of autoimmune disease, or thyroid problems.  The patient does report  history of colonoscopy approximately 1-2 years ago with possible questionable colonic polyp removal, however, the patient is unsure of what group performed a colonoscopy and what the actual results were.  The patient states he came to the ED secondary to persistent diarrhea and intermittent bloody stools. The patient describes headache and bloody stools, overall no significant weakness, syncope, chest pain, or lethargy per patient.  The patient describes headache as radiating from front to back bilaterally with intermittent eye tearing as well as positive photophobia and nausea.  No aura with headache being relieved with rest and intermittent Good powder use as well as intermittent dark light.  The patient states that this is his usual pattern of headache of headache, duration and intensity has seemingly worsened.  The patient also reports failure of Imitrex used in the past remotely, and headache pain has been reported to be relieved in the ED today with p.r.n. Dilaudid use.  ALLERGIES:  NKDA.  PAST MEDICAL HISTORY: 1. History of migraine headache. 2. Questionable history of hypertension.  MEDICATIONS:  Aspirin.  PAST SURGICAL HISTORY:  The patient reports history of abdominal hernia repair in his early 25s,  unsure of exact time frame and what actually was done.  SOCIAL HISTORY:  The patient currently lives with mother and brother, works intermittently in [employer].  No tobacco, alcohol, or drug use per patient.  FAMILY HISTORY:  Positive for hypertension and diabetes.  REVIEW OF SYSTEMS:  Positive for fevers, chills, headache, nausea, diarrhea, questionable melena.  Negative for chest pain, edema, orthopnea, PND, cough, wheezing, sputum production, dysuria and hesitancy, rash, ecchymoses, arthralgia, swelling, petechiae, bleeding, polyuria, polydipsia.  PHYSICAL EXAMINATION:  VITAL SIGNS:  Temperature 96.8-100.2, pulse 64- 95, respirations 14-18, blood pressure 120 to  134 over 70 to 86, satting 94% on room air. GENERAL:  The patient is in bed in no acute distress. HEENT: Normocephalic, atraumatic.  Extraocular movements intact.  No scleral icterus.  Tympanic membranes clear bilaterally.  Poor overall dentition. NECK:  Large neck girth, supple, full range of motion. CARDIOVASCULAR:  Regular rate and rhythm.  No rubs, gallops, or murmurs are auscultated. LUNGS:  Clear to auscultation bilaterally.  No wheezes, rales, or rhonchi. ABDOMEN:  Obese, decreased bowel sounds, diffusely tense to palpation, tenderness to palpation in lower abdomen,  diffusely no epigastric pain. RECTAL:  Deferred secondary to ongoing diarrhea at the time of exam. EXTREMITIES:  2+ peripheral pulses.  Extremities are cool to touch diffusely. NEUROLOGIC:  Grossly normal.  Cranial nerves II-XII grossly intact. MUSCULOSKELETAL:  Strength 4-5/5 diffusely range of motion within normal limits diffusely.  LABS AND STUDIES: 1. CT of abdomen and pelvis showing pancolitis with questionable     pseudomembranous colitis versus ulcerative colitis with no obvious     signs of bowel obstruction. 2. Head CT with no acute intracranial abnormalities. 3. CBC, white count 7.3, hemoglobin 15.6, hematocrit 43.0, platelet     count of 199. 4. CMET, sodium 134, potassium 3.4, chloride 90, CO2 of 27, BUN 10,     creatinine 0.55, chloride 96, T-bili 0.3, alk phos 57, AST 32, ALT     40, total protein 6.6, albumin 3.3, calcium 9.3, and INR of 0.95.  ASSESSMENT AND PLAN:  This is a 50 year old male with a 4-day history of headache, diarrhea, and bloody stools. 1. Diarrhea/bloody stools.  Overall presentation is in conjunction     with headache flare, subjective fevers and chills, is somewhat is     relatively consistent with a viral gastroenteritis process.     However, given patient does have a noted pancolitis on exam,     pancolitis on CT as well as subjective bloody stools.  The patient     will  be covered for nonviral sources of colitis with the patient     being started on Cipro and Flagyl as well as prednisone 60.  The     patient will also be placed on high-dose PPI and his aspirin will     be held.  We will check stool for C, diff, we  will check stool     cultures, fecal lactoferrin as well as stool ova and parasites.     The patient does report questionable history of colonic polyps in     the past.  We will formally consult gastric GI in the morning, we     will Hemoccult stools.  Overall hemoglobin is stable on     presentation as well as overall hemodynamically stable since     admission. 2. Headache.  This is likely migraine cluster/tension headache flare.     Head CT is negative for any intracranial abnormalities which is  reassuring.  No focal neurological deficits.  We will continue with     p.r.n. Dilaudid use for treatment in the setting of ongoing     diarrhea.  Given improvement with Goody powders in the past, it may     be beneficial for patient to be placed on Toradol for this     headache, however, in the setting of ongoing GI losses and     clinically with some issues of hypoperfusion on exam, we will hold     off on any NSAID uses pending, creatinine remained stable as it has     been on presentation with the adequate hydration. 3. FEN/GI:  The patient was clinically some mildly intravascular with     extremities cool to touch with likely transient  intravascular     fluid shifting in the setting of ongoing diarrhea.  The patient     received normal saline bolus in ED.  We will place patient on     maintenance IV fluids, we will repeat electrolytes p.r.n. and we     will make patient n.p.o. for bowel rest, started patient on normal     saline at 125 an hour, p.r.n. Zofran for nausea.  We will     transition D5 normal saline if GI consult and possible colonoscopy     is extended in-house. 4. Prophylaxis.  Sequential compression device in setting of  possible     gastrointestinal bleeding. 5. Dispo.  Pending for further evaluation.     Doree Albee, MD   ______________________________ Nestor Ramp, MD    SN/MEDQ  D:  03/04/2011  T:  03/04/2011  Job:  [id number]  Electronically Signed by Doree Albee  on 03/21/2011 07:23:09 PM Electronically Signed by Denny Levy MD on 04/14/2011 11:56:57 AM

## 2011-05-17 ENCOUNTER — Emergency Department (HOSPITAL_COMMUNITY): Payer: BC Managed Care – PPO

## 2011-05-17 ENCOUNTER — Inpatient Hospital Stay (HOSPITAL_COMMUNITY)
Admission: EM | Admit: 2011-05-17 | Discharge: 2011-05-20 | DRG: 808 | Disposition: A | Discharge status: Home Health | Payer: BC Managed Care – PPO | Attending: Cardiovascular Disease | Admitting: Cardiovascular Disease

## 2011-05-17 DIAGNOSIS — I214 Non-ST elevation (NSTEMI) myocardial infarction: Principal | ICD-10-CM | POA: Diagnosis present

## 2011-05-17 DIAGNOSIS — Z7902 Long term (current) use of antithrombotics/antiplatelets: Secondary | ICD-10-CM

## 2011-05-17 DIAGNOSIS — E785 Hyperlipidemia, unspecified: Secondary | ICD-10-CM | POA: Diagnosis present

## 2011-05-17 DIAGNOSIS — N200 Calculus of kidney: Secondary | ICD-10-CM | POA: Diagnosis present

## 2011-05-17 DIAGNOSIS — I2582 Chronic total occlusion of coronary artery: Secondary | ICD-10-CM | POA: Diagnosis present

## 2011-05-17 DIAGNOSIS — I10 Essential (primary) hypertension: Secondary | ICD-10-CM | POA: Diagnosis present

## 2011-05-17 DIAGNOSIS — R079 Chest pain, unspecified: Secondary | ICD-10-CM

## 2011-05-17 DIAGNOSIS — Z7982 Long term (current) use of aspirin: Secondary | ICD-10-CM

## 2011-05-17 DIAGNOSIS — I2510 Atherosclerotic heart disease of native coronary artery without angina pectoris: Secondary | ICD-10-CM | POA: Diagnosis present

## 2011-05-17 DIAGNOSIS — I219 Acute myocardial infarction, unspecified: Secondary | ICD-10-CM

## 2011-05-17 DIAGNOSIS — Z79899 Other long term (current) drug therapy: Secondary | ICD-10-CM

## 2011-05-17 HISTORY — DX: Acute myocardial infarction, unspecified: I21.9

## 2011-05-17 LAB — BASIC METABOLIC PANEL
BUN: 10 mg/dL (ref 6–23)
Calcium: 9.4 mg/dL (ref 8.4–10.5)
Creatinine, Ser: 0.92 mg/dL (ref 0.50–1.35)
GFR calc Af Amer: >60 mL/min (ref >60)
GFR calc non Af Amer: >60 mL/min (ref >60)
Glucose, Bld: 128 mg/dL — ABNORMAL HIGH (ref 70–99)
Potassium: 4.3 mEq/L (ref 3.5–5.1)

## 2011-05-17 LAB — CBC
HCT: 41.9 % (ref 39.0–52.0)
HCT: 40.6 % (ref 39.0–52.0)
Hemoglobin: 14.6 g/dL (ref 13.0–17.0)
Hemoglobin: 14 g/dL (ref 13.0–17.0)
MCH: 31.8 pg (ref 26.0–34.0)
MCHC: 34.8 g/dL (ref 30.0–36.0)
MCHC: 34.5 g/dL (ref 30.0–36.0)
RBC: 4.57 MIL/uL (ref 4.22–5.81)
RBC: 4.4 MIL/uL (ref 4.22–5.81)

## 2011-05-17 LAB — DIFFERENTIAL
Basophils Absolute: 0.1 10*3/uL (ref 0.0–0.1)
Lymphocytes Relative: 26 % (ref 12–46)
Monocytes Absolute: 0.5 10*3/uL (ref 0.1–1.0)
Neutro Abs: 3.5 10*3/uL (ref 1.7–7.7)
Neutrophils Relative %: 63 % (ref 43–77)

## 2011-05-17 LAB — CK TOTAL AND CKMB (NOT AT ARMC)
CK, MB: 7.8 ng/mL (ref 0.3–4.0)
Relative Index: 3.9 — ABNORMAL HIGH (ref 0.0–2.5)
Total CK: 202 U/L (ref 7–232)

## 2011-05-17 LAB — APTT: aPTT: 29 seconds (ref 24–37)

## 2011-05-17 LAB — PROTIME-INR
INR: 0.97 (ref 0.00–1.49)
Prothrombin Time: 13.1 seconds (ref 11.6–15.2)

## 2011-05-17 LAB — HEPARIN LEVEL (UNFRACTIONATED): Heparin Unfractionated: 0.53 IU/mL (ref 0.30–0.70)

## 2011-05-18 DIAGNOSIS — I2510 Atherosclerotic heart disease of native coronary artery without angina pectoris: Secondary | ICD-10-CM

## 2011-05-18 LAB — COMPREHENSIVE METABOLIC PANEL
Albumin: 3.5 g/dL (ref 3.5–5.2)
BUN: 11 mg/dL (ref 6–23)
Calcium: 9.2 mg/dL (ref 8.4–10.5)
Creatinine, Ser: 0.65 mg/dL (ref 0.50–1.35)
Total Bilirubin: 0.3 mg/dL (ref 0.3–1.2)
Total Protein: 6.3 g/dL (ref 6.0–8.3)

## 2011-05-18 LAB — LIPID PANEL
Cholesterol: 185 mg/dL (ref 0–200)
HDL: 66 mg/dL (ref >39)
Total CHOL/HDL Ratio: 2.8 RATIO
Triglycerides: 70 mg/dL (ref <150)
VLDL: 14 mg/dL (ref 0–40)

## 2011-05-18 LAB — POCT ACTIVATED CLOTTING TIME: Activated Clotting Time: 512 seconds

## 2011-05-18 LAB — HEPARIN LEVEL (UNFRACTIONATED): Heparin Unfractionated: 0.49 IU/mL (ref 0.30–0.70)

## 2011-05-18 LAB — CARDIAC PANEL(CRET KIN+CKTOT+MB+TROPI)
Relative Index: 5.7 — ABNORMAL HIGH (ref 0.0–2.5)
Total CK: 184 U/L (ref 7–232)
Troponin I: 2 ng/mL (ref <0.30)

## 2011-05-18 LAB — CBC
HCT: 39.6 % (ref 39.0–52.0)
Hemoglobin: 13.8 g/dL (ref 13.0–17.0)
MCH: 32.2 pg (ref 26.0–34.0)
MCHC: 34.8 g/dL (ref 30.0–36.0)
RBC: 4.29 MIL/uL (ref 4.22–5.81)

## 2011-05-19 LAB — CBC
HCT: 41.9 % (ref 39.0–52.0)
Hemoglobin: 14.4 g/dL (ref 13.0–17.0)
MCHC: 34.4 g/dL (ref 30.0–36.0)

## 2011-05-19 LAB — BASIC METABOLIC PANEL
BUN: 11 mg/dL (ref 6–23)
GFR calc non Af Amer: >60 mL/min (ref >60)
Glucose, Bld: 112 mg/dL — ABNORMAL HIGH (ref 70–99)
Potassium: 3.5 mEq/L (ref 3.5–5.1)

## 2011-05-20 DIAGNOSIS — R072 Precordial pain: Secondary | ICD-10-CM

## 2011-05-20 DIAGNOSIS — I214 Non-ST elevation (NSTEMI) myocardial infarction: Secondary | ICD-10-CM

## 2011-05-20 NOTE — H&P (Addendum)
[name] Pearson  ARVIL, UTZ NO.:  [id number]  MEDICAL RECORD NO.:  [id number]  LOCATION:  2921                         FACILITY:  [facility]  PHYSICIAN:  Veverly Fells. Excell Seltzer, MD  DATE OF BIRTH:  03-18-1961  DATE OF ADMISSION:  05/17/2011 DATE OF DISCHARGE:                             HISTORY & PHYSICAL     PRIMARY CARDIOLOGIST:  New.  PRIMARY MEDICAL DOCTOR:  None.  CHIEF COMPLAINT:  Chest pain.  HISTORY OF PRESENT ILLNESS:  [name] is a 50 year old gentleman with a history of nonobstructive CAD by cath in 2003, migraine headaches, and colitis who presents to the [facility] with complaints of chest pain, and ultimately ruled in with the first set of cardiac enzymes with a troponin of 0.56.  He has started noticing chest discomfort approximately 3 days ago, which he describes as a stabbing pain.  He would notice diaphoresis with this, and it would get better with rest. However, he thought it may be related to his hernia.  This morning while walking his dogs, he developed similar chest discomfort with right arm numbness, and felt as though he was going to be sick with nausea and vomiting.  He has also had shortness of breath.  His brother brought him to the ER where enzymes demonstrated normal CKs, but MB of 7.8 and troponin 0.56.  EKG shows right bundle-branch block with T-wave inversion in V2-V4.  The right bundle is old, but the T-wave inversions are new.  He was treated with aspirin, heparin, and nitro paste with pain resolving down to 1/10, and has nearly gone.  PAST MEDICAL HISTORY: 1. Migraine headaches.  The patient states he has been evaluated     multiple times in the past for chronic headache in which he has had     occasional nose bleeds and bloodshot eyes.  This lasted for 4-5     months.     a.     CT of the head was negative in May 2012.     b.     MRI in 2008 demonstrating 4.6 mm area of pituitary gland      consistent with  microadenoma. 2. History of duodenitis and colitis in May 2012 with heme-positive     stools at this time.     a.     The patient endorses only rare streaking of blood now. 3. Nonobstructive coronary artery disease by cath in 2003 with 30%     ostial RCA with EF of 65%. 4. Right bundle-branch block noted on EKG in 2008. 5. Kidney stones.  PAST SURGICAL HISTORY:  Hernia repair in the 20s, tooth extraction, and hammertoe surgery.  OUTPATIENT MEDICATIONS: 1. Aspirin 81 mg daily. 2. Topiramate 100 mg nightly. 3. Prednisone 20 mg daily. 4. Protonix 40 mg b.i.d. 5. Multivitamin 1 tablet daily. 6. Tylenol p.r.n.  ALLERGIES:  No known drug allergies.  SOCIAL HISTORY:  [name] lives with his brother and mother.  His mother is quite sick, so they take turns taking care of her.  He works in the [employer] with a fairly physical job.  He does not have any children.  He denies any tobacco or alcohol use.  FAMILY HISTORY:  Mother is living at age 9 and suffers from residual problems from CVA.  Father died of an MI at age 27.  He has one brother who is overweight and may have coronary artery disease, although the patient is not sure of the specifics.  REVIEW OF SYSTEMS:  No fevers, chills, nausea, or vomiting.  He denies any outright melena or hematemesis, but does endorse rare streaking of stool occasionally  wiping which is infrequent that has not happened recently.  Positive for dyspnea on exertion, shortness of breath, and chest pain as above.  He did have a presyncopal feeling with the chest discomfort earlier, stating that the pain hurts so bad that he slowly has to stop.  No palpitations.  All other systems reviewed and otherwise negative.  LABORATORY DATA:  WBC 5.5, hemoglobin 14.6, hematocrit 41.9, and platelet count 162.  Sodium 140, potassium 4.3, chloride 104, CO2 of 27, glucose 128, BUN 10, and creatinine 0.92.  Coags are within normal limits. CK 202, MB 7.8, troponin  0.56.  EKG:  Right bundle-branch block with associated T-wave inversion in V2- V4.  T-wave inversions are new.  RADIOLOGIC STUDIES:  Chest x-ray showed no active disease.  PHYSICAL EXAMINATION:  VITAL SIGNS:  Temperature 98:1, pulse 73, respirations 18, blood pressure 126/82, and pulse ox 98% on 2 liters. GENERAL:  This is a pleasant middle-aged white male in no acute distress. HEENT::  Normocephalic and atraumatic with extraocular movements intact and clear sclerae.  Nares without discharge. NECK:  Supple without carotid bruit. HEART:  Auscultation of the heart reveals regular rate and rhythm with S1 and S2 without murmurs, rubs, or gallops. LUNGS:  Clear to auscultation bilaterally without wheezes, rales, or rhonchi. ABDOMEN:  Soft, nontender, or nondistended with positive bowel sounds. EXTREMITIES:  Warm, dry without edema.  He has 2+ bounding pedal pulses bilaterally. NEUROLOGICAL:  He is alert and oriented x3.  He responds questions appropriately with a normal affect.  ASSESSMENT/PLAN:  The patient was seen and examined by Dr. Excell Seltzer and myself.  This is a 50 year old gentleman with a history of nonobstructive coronary artery disease by catheterization in 2003, migraine headaches, and colitis approximately 2 months ago with stable H and H who presents to the [facility] with fairly typical anginal symptoms and has subsequently ruled in for an NSTEMI with the first set of cardiac enzymes demonstrating elevated MB and troponin of 0.56.  He has been appropriately started on heparin as well as nitro paste and is nearly pain free.  He will be admitted to the hospital, cycling cardiac enzymes.  We will continue heparin and nitroglycerin.  He will be initiated on low-dose beta-blockade as well as statin therapy and fasting lipids will be checked.  Otherwise, his home medications will be continued.  Timing of catheterization will be discussed with Dr. Excell Seltzer with further  recommendations to follow.     Ronie Spies, P.A.C.   ______________________________ Veverly Fells. Excell Seltzer, MD    DD/MEDQ  D:  05/17/2011  T:  05/18/2011  Job:  [id number]  Electronically Signed by Tonny Bollman MD on 05/20/2011 12:48:04 AM Electronically Signed by Ronie Spies  on 05/22/2011 02:17:14 PM

## 2011-05-20 NOTE — Cardiovascular Report (Signed)
[name] Pearson  CHEIKH, BRAMBLE NO.:  [id number]  MEDICAL RECORD NO.:  [id number]  LOCATION:  2921                         FACILITY:  [facility]  PHYSICIAN:  Arturo Morton. [name], MD, FACCDATE OF BIRTH:  1961/02/28  DATE OF PROCEDURE:  05/18/2011 DATE OF DISCHARGE:                           CARDIAC CATHETERIZATION   INDICATIONS:  This gentleman underwent catheterization in 2003.  He had nonobstructive disease.  He presented with recurrent chest pain with positive enzymes.  He was treated with aspirin and heparin, and his pain resolved.  His enzymes came down slightly with a troponin of 2.8 dropping to 2.0.  CK-MB fell from 14-10.  He was brought to the lab for further evaluation and treatment.  PROCEDURE: 1. Left heart catheterization. 2. Selective coronary arteriography. 3. Selective left ventriculography. 4. Stenting of the left anterior descending artery with a non drug-     eluting platform.  DESCRIPTION OF PROCEDURE:  The patient was brought to the cath lab, prepped and draped in the usual fashion after informed consent.  We used an anterior puncture in the right radial artery and placed a 5-French sheath.  Intra-arterial verapamil 3 mg and 5000 units of intravenous heparin were administered.  Following this, views of the left and right coronary arteries were obtained in multiple angiographic projections. Central aortic and left ventricular pressures were measured with pigtail.  Ventriculography was performed in the RAO projection.  The patient had a total occlusion of the left anterior descending artery with right-to-left collaterals.  His right was not progressed from a previous study of 2003.  The circumflex was without significant focal obstruction.  With this, it was felt that a percutaneous approach would probably be best.  Dr. Clifton James and I reviewed the films in the lab and were in agreement.  Following this, plans were made for percutaneous intervention.  I  had previously spoken with Dr. Excell Seltzer, and we felt that a bare-metal stent would be the best platform.  Specifically, there were concerns about long-term reliability with regard to medication compliance.  More importantly, the patient has history of nosebleeds and rectal bleed so his ability to take prolonged dual antiplatelet therapy was unclear.  Preparations were then made for percutaneous intervention. A 5-sheath was exchanged for a 6-sheath.  Bivalirudin was given according to protocol and Prasugrel was administered.  60 mg orally were given.  We were able to cross easily with a Prowater wire.  There appeared after this to be a very short focal lesion and we predilated with first a 2.25-mm balloon.  A 2.75-mm balloon was then utilized to improve on that expansion.  We measured the lesion carefully with regard to the stent length.  A 3.0 x 28 Vision non drug-eluting platform stent was placed up to 14 atmospheres.  Postdilatation was done in the distal with an 3.25 post dilatation and then a 3.5 noncompliant balloon was used to post dilate the more proximal stent.  There was an excellent final angiographic appearance with some preservation of access to the diagonal.  TIMI flow was III.  Pain was relieved.  There were no major complications.  The catheter was removed and a TR band  placed.  He was taken to the holding area in satisfactory clinical condition.  HEMODYNAMIC DATA: 1. Central aortic pressure 114/78, mean 92. 2. LV pressure 114/36. 3. No gradient or pullback across the aortic valve.  ANGIOGRAPHIC DATA: 1. Ventriculography in the RAO projection reveals anteroapical hypo to     near akinesis.  This may just be stunned, however, there is no     dyskinesis. 2. The left main has mild ostial tapering, but no focal obstruction. 3. The LAD is totally occluded proximally.  Following reopening the     artery, there was a fairly short focal lesion across the septal     area.  The  lesion was dilated with a 3-mm stent taken up to 3.25     and 3.5 more proximally.  The stent ended just prior to the     diagonal.  The diagonal has some ostial narrowing of perhaps 30-     40%.  There is mild distal LAD irregularities but no areas of high-     grade focal obstruction.  The distal LAD wraps the apical tip. 4. The circumflex is a large-caliber vessel providing two marginal     branches and free of critical disease on the initial films.  There     was also evidence of some LAD collateralization. 5. The right coronary artery previously had ostial disease, does not     appear to be progression.  Some of this may be due to ectasia in     the proximal vessel but would be graded at 30-40%.  There is 30-40%     mid segmental disease.  There is a posterior descending and 3     posterolateral branches all of which showed mild luminal     irregularity but no critical stenoses.  CONCLUSIONS: 1. Non-ST-elevation myocardial infarction due to total occlusion of     the left anterior descending artery with left-to-left and right-to-     left collaterals. 2. Successful reperfusion using a non drug-eluting platform. 3. Mild residual disease in the right coronary artery as noted above.  DISPOSITION: 1. A dual antiplatelet therapy for 1 month minimum would be     recommended followed by hopefully 12 months if the patient can     tolerate. 2. Follow up with Dr. Excell Seltzer.     Arturo Morton. [name], MD, [facility]     TDS/MEDQ  D:  05/18/2011  T:  05/18/2011  Job:  [id number]  cc:   CV Laboratory  Electronically Signed by Shawnie Pons MD [facility] on 05/20/2011 07:29:30 AM

## 2011-05-28 ENCOUNTER — Ambulatory Visit: Payer: BC Managed Care – PPO | Admitting: Internal Medicine

## 2011-06-03 ENCOUNTER — Encounter: Payer: BC Managed Care – PPO | Admitting: Physician Assistant

## 2011-06-04 ENCOUNTER — Encounter: Payer: Self-pay | Admitting: Physician Assistant

## 2011-06-07 ENCOUNTER — Encounter: Payer: Self-pay | Admitting: *Deleted

## 2011-06-07 ENCOUNTER — Telehealth: Payer: Self-pay | Admitting: Physician Assistant

## 2011-06-07 ENCOUNTER — Encounter: Payer: Self-pay | Admitting: Physician Assistant

## 2011-06-07 ENCOUNTER — Ambulatory Visit (INDEPENDENT_AMBULATORY_CARE_PROVIDER_SITE_OTHER): Payer: BC Managed Care – PPO | Admitting: Physician Assistant

## 2011-06-07 VITALS — BP 118/81 | HR 63 | Resp 16 | Ht 74.0 in | Wt 237.0 lb

## 2011-06-07 DIAGNOSIS — I25119 Atherosclerotic heart disease of native coronary artery with unspecified angina pectoris: Secondary | ICD-10-CM | POA: Insufficient documentation

## 2011-06-07 DIAGNOSIS — E785 Hyperlipidemia, unspecified: Secondary | ICD-10-CM

## 2011-06-07 DIAGNOSIS — I2510 Atherosclerotic heart disease of native coronary artery without angina pectoris: Secondary | ICD-10-CM | POA: Insufficient documentation

## 2011-06-07 DIAGNOSIS — I214 Non-ST elevation (NSTEMI) myocardial infarction: Secondary | ICD-10-CM

## 2011-06-07 LAB — BASIC METABOLIC PANEL
BUN: 11 mg/dL (ref 6–23)
CO2: 25 mEq/L (ref 19–32)
Chloride: 99 mEq/L (ref 96–112)
Creatinine, Ser: 0.8 mg/dL (ref 0.4–1.5)

## 2011-06-07 NOTE — Telephone Encounter (Signed)
Pt Dropped off Attending Physicians Statement from [employer]Comp sent to [facility] for completion  06/07/11/km.Marland KitchenHealthport will not complete these forms.  06/07/11/KM

## 2011-06-07 NOTE — Assessment & Plan Note (Signed)
Doing well post MI.  Reviewed importance of taking ASA and Effient.  He will be referred to cardiac rehab.  I think he can return to work in 2 weeks at half days, then increase to full days.  He will follow up with Dr. Excell Seltzer in 6-8 weeks.

## 2011-06-07 NOTE — Assessment & Plan Note (Signed)
Check FLP and LFTs in 6-8 weeks.

## 2011-06-07 NOTE — Patient Instructions (Addendum)
Lab today--BMP 414.01  410.72  272.4  You have been referred to Cardiac Rehab at [facility].  Schedule an appointment for fasting lab in 6-8 weeks--Lipid profile/liver profile  414.01  410.72  272.4  Schedule an appointment to see Dr Excell Seltzer in 6-8 weeks.

## 2011-06-07 NOTE — Progress Notes (Signed)
History of Present Illness: Primary Cardiologist: Dr. Tonny Bollman  Michael Pearson is a 50 y.o. male who presents for post hospital follow up.  He has a h/o migraine headaches, colitis and hyperlipidemia.  He presented to [facility] 7/16-7/19 with chest pain, diaphoresis and ruled in for NSTEMI.  Cardiac cath on 7/17: pLAD occluded, Dx 30-40%, pRCA 30-40%, mRCA 30-40%.  Proximal LAD was treated with a BMS.  Echo 7/19:  EF 60-65%, mild LVH.  Labs:  K 3.5, creat 0.68, TC 185, TG 70, HDL 66, LDL 105, ALT 28, A1C 5.7, TSH 0.868.  The patient denies chest pain, shortness of breath, syncope, orthopnea, PND or significant pedal edema.  His right wrist is ok.  He is interested in cardiac rehab.    Past Medical History  Diagnosis Date  . Migraines     Has been evaluated multiple times in past for chronic headache in which pt has had occasional nose bleeds and bloodshot eyes. This lasted for 4-5 months. CT of the head was negative in May 2012.  [name]     MRI in 2008 demonstrating 4.6 mm area of pituitary gland   . Duodenitis     May 2012 with heme positive stools at this time. Endorses only rare streaking of blood now.  . Colitis   . CAD (coronary artery disease)     NSTEMI 7/12:  Cardiac cath on 7/17: pLAD occluded, Dx 30-40%, pRCA 30-40%, mRCA 30-40%.  Proximal LAD was treated with a BMS.  Echo 7/19:  EF 60-65%, mild LVH  . RBBB (right bundle branch block)     Noted on EKG in 2008  . Kidney stones     Current Outpatient Prescriptions  Medication Sig Dispense Refill  . acetaminophen (TYLENOL) 325 MG tablet Take 650 mg by mouth 2 (two) times daily as needed.        Marland Kitchen aspirin 81 MG tablet Take 81 mg by mouth daily.        Marland Kitchen atorvastatin (LIPITOR) 40 MG tablet Take 40 mg by mouth at bedtime.        Marland Kitchen lisinopril (PRINIVIL,ZESTRIL) 2.5 MG tablet Take 2.5 mg by mouth daily.        . metoprolol tartrate (LOPRESSOR) 25 MG tablet Take 12.5 mg by mouth 2 (two) times daily.        . Multiple Vitamin  (MULTIVITAMIN) tablet Take 1 tablet by mouth daily.        . nitroGLYCERIN (NITROSTAT) 0.4 MG SL tablet Place 0.4 mg under the tongue every 5 (five) minutes as needed.        . pantoprazole (PROTONIX) 40 MG tablet Take 40 mg by mouth 2 (two) times daily.        . prasugrel (EFFIENT) 10 MG TABS Take 10 mg by mouth daily.        . ranitidine (ZANTAC) 150 MG capsule Take 150 mg by mouth 2 (two) times daily.        Marland Kitchen topiramate (TOPAMAX) 100 MG tablet Take 100 mg by mouth at bedtime.          Allergies: No Known Allergies  Social Hx:  Non smoker.  Works as a Copy.  Vital Signs: BP 118/81  Pulse 63  Resp 16  Ht 6\' 2"  (1.88 m)  Wt 237 lb (107.502 kg)  BMI 30.43 kg/m2  PHYSICAL EXAM: Well nourished, well developed, in no acute distress HEENT: normal Neck: no JVD Vascular:  No carotid bruits Cardiac:  normal S1,  S2; RRR; no murmur Lungs:  clear to auscultation bilaterally, no wheezing, rhonchi or rales Abd: soft, nontender, no hepatomegaly Ext: no edema;  Right wrist without hematoma or bruit Skin: warm and dry Neuro:  CNs 2-12 intact, no focal abnormalities noted  EKG:  NSR, HR 63, RBBB, no ischemic changes.  ASSESSMENT AND PLAN:

## 2011-06-07 NOTE — Assessment & Plan Note (Signed)
Continue ASA and Effient and statin.  ACE added as well during admission.  Check BMET to follow up.

## 2011-06-15 NOTE — Discharge Summary (Signed)
[name] Kitchen  Michael Pearson, Michael Pearson NO.:  [id number]  MEDICAL RECORD NO.:  [id number]  LOCATION:  2921                         FACILITY:  [facility]  PHYSICIAN:  Veverly Fells. Excell Seltzer, MD  DATE OF BIRTH:  12-May-1961  DATE OF ADMISSION:  05/17/2011 DATE OF DISCHARGE:  05/20/2011                              DISCHARGE SUMMARY   PRIMARY CARDIOLOGIST:  Veverly Fells. Excell Seltzer, MD  He does not have primary care provider.  DISCHARGE DIAGNOSIS:  Non-ST-segment elevation myocardial infarction.  SECONDARY DIAGNOSES: 1. Coronary artery disease, status post successful percutaneous     coronary intervention and bare-metal stenting of the LAD this     admission. 2. Hyperlipidemia. 3. History of duodenitis and colitis in May 2012 with short course of     steroids at that time. 4. History of microadenoma of the pituitary gland. 5. History migraine headaches. 6. Nephrolithiasis.  HISTORY OF PRESENT ILLNESS:  A 50 year old male with the above problem list who was in his usual state of health until approximately 3 weeks prior to admission when he began to experience intermittent stabbing chest discomfort associated with diaphoresis and better with rest.  On the morning of admission while walking his dog, he had recurrent chest discomfort with associated right arm numbness as well as nausea and vomiting with dyspnea.  Taken to the ED by his brother and was found to have an elevation of CK-MB to 7.8 with troponin-I of 0.56.  ECG showed right bundle-branch block with Q-wave inversion in V2-V4.  T-wave inversions were new.  He was admitted for further evaluation and management of non-ST-elevation MI.  HOSPITAL COURSE:  The patient eventually peaked his CK at 238, MB 14.2, and troponin-I at 2.86.  The patient was placed on aspirin, beta- blocker, and statin therapy, and underwent diagnostic catheterization on May 18, 2011, revealing a total occlusion of proximal LAD with otherwise nonobstructive  disease, normal LV function with anteroapical hypokinesis to akinesis.  The LAD was successfully stented with placement of a 3.0 x 20-mm Vision bare metal stent.  The patient tolerated this procedure well and postprocedure was monitored in the Coronary Step-down Area.  He has had no recurrent chest pain.  He has been ambulating with cardiac rehab without symptoms or limitations.  He did undergo 2-D echocardiogram this afternoon which showed an EF of 60- 65% and mild LVH.  He will be discharged home today in good condition.  DISCHARGE LABS:  Hemoglobin 14.4, hematocrit 41.9, WBC 8.8, platelets 182.  Sodium 137, potassium 3.5, chloride 105, CO2 of 23, BUN 11, creatinine 0.68, glucose 112, total bilirubin 0.3, alkaline phosphatase 55, AST 30, ALT 28, total protein 6.3, albumin 3.5, calcium 9.6. Hemoglobin A1c 5.7.  CK 184, MB 10.4, troponin-I 2.0.  Total cholesterol 185, triglycerides 70, HDL 66, LDL 105.  TSH 0.868.  MRSA screen was negative.  DISPOSITION:  The patient will be discharged home today in good condition.  FOLLOWUP PLANS AND APPOINTMENTS:  We have arranged a followup with Tereso Newcomer, PA, on June 03, 2011, at 12 p.m.  He is advised to obtain a primary care provider.  DISCHARGE MEDICATIONS: 1. Lipitor 40 mg nightly. 2. Lisinopril  2.5 mg daily. 3. Metoprolol 25 mg half tablet b.i.d. 4. Nitroglycerin 0.4 mg sublingual p.r.n. chest pain. 5. Prasugrel 10 mg daily. 6. Aspirin 81 daily. 7. Acetaminophen 325 mg 2 tablets b.i.d. p.r.n. 8. Multivitamin daily. 9. Protonix 4 mg b.i.d. 10.Topiramate 100 mg nightly.  OUTSTANDING LABS AND STUDIES:  Follow up basic metabolic panel on June 03, 2011.  Follow up lipids and LFTs in 6-8 weeks given new statin therapy.  Duration of discharge encounter, 45 minutes including physician time.     [name], ANP   ______________________________ Veverly Fells. Excell Seltzer, MD    CB/MEDQ  D:  05/20/2011  T:  05/21/2011  Job:   [id number]  Electronically Signed by [name] ANP on 05/24/2011 10:38:38 AM Electronically Signed by Tonny Bollman MD on 06/15/2011 05:21:05 AM

## 2011-06-17 ENCOUNTER — Ambulatory Visit (INDEPENDENT_AMBULATORY_CARE_PROVIDER_SITE_OTHER): Payer: BC Managed Care – PPO | Admitting: Family Medicine

## 2011-06-17 ENCOUNTER — Encounter: Payer: Self-pay | Admitting: Family Medicine

## 2011-06-17 DIAGNOSIS — J4520 Mild intermittent asthma, uncomplicated: Secondary | ICD-10-CM | POA: Insufficient documentation

## 2011-06-17 DIAGNOSIS — E785 Hyperlipidemia, unspecified: Secondary | ICD-10-CM

## 2011-06-17 DIAGNOSIS — R0609 Other forms of dyspnea: Secondary | ICD-10-CM

## 2011-06-17 DIAGNOSIS — Z9109 Other allergy status, other than to drugs and biological substances: Secondary | ICD-10-CM

## 2011-06-17 DIAGNOSIS — J309 Allergic rhinitis, unspecified: Secondary | ICD-10-CM

## 2011-06-17 DIAGNOSIS — G43909 Migraine, unspecified, not intractable, without status migrainosus: Secondary | ICD-10-CM

## 2011-06-17 MED ORDER — BECLOMETHASONE DIPROPIONATE 40 MCG/ACT IN AERS: 2.0000 | INHALATION_SPRAY | Freq: Two times a day (BID) | RESPIRATORY_TRACT | Status: DC

## 2011-06-17 MED ORDER — MOMETASONE FUROATE 50 MCG/ACT NA SUSP: 2.0000 | Freq: Every day | NASAL | Status: DC

## 2011-06-17 MED ORDER — FEXOFENADINE HCL 180 MG PO TABS: 180.0000 mg | ORAL_TABLET | Freq: Every day | ORAL | Status: DC

## 2011-06-17 NOTE — Progress Notes (Signed)
  Subjective:    Patient ID: Michael Pearson, male    DOB: [birth year], 50 y.o.   MRN: [id number]  HPI Pt here to establish.  He had MI last month but long before that he has complained about DOE especially with humid weather.  No previous treatment.  + hx environmental allergies.   Review of Systems As above    Objective:   Physical Exam  Constitutional: He is oriented to person, place, and time. He appears well-developed and well-nourished.  Neck: Normal range of motion. Neck supple.  Cardiovascular: Normal rate, regular rhythm and normal heart sounds.   No murmur heard. Pulmonary/Chest: Effort normal and breath sounds normal. No respiratory distress. He has no wheezes. He has no rales. He exhibits no tenderness.  Musculoskeletal: Normal range of motion. He exhibits no edema and no tenderness.  Neurological: He is alert and oriented to person, place, and time.  Skin: Skin is warm and dry.  Psychiatric: He has a normal mood and affect. His behavior is normal. Thought content normal.          Assessment & Plan:

## 2011-06-17 NOTE — Assessment & Plan Note (Signed)
?   Hx asthma---  qvar 40 ---2 puffs bid proair 2 puffs qid prn Allegra qd  rto 2 weeks or sooner prn

## 2011-06-17 NOTE — Patient Instructions (Signed)
Allergies, Generic Allergies may happen from anything your body is sensitive to. This may be food, medicines, pollens, chemicals, and nearly anything around you in everyday life that produces allergens. An allergen is anything that causes an allergy producing substance. Heredity is often a factor in causing these problems. This means you may have some of the same allergies as your parents. Food allergies happen in all age groups. Food allergies are some of the most severe and life threatening. Some common food allergies are cow's milk, seafood, eggs, nuts, wheat, and soybeans. SYMPTOMS  Swelling around the mouth.   An itchy red rash or hives.   Vomiting or diarrhea.   Difficulty breathing.  SEVERE ALLERGIC REACTIONS ARE LIFE-THREATENING.  This reaction is called anaphylaxis. It can cause the mouth and throat to swell and cause difficulty with breathing and swallowing. In severe reactions only a trace amount of food (for example, peanut oil in a salad) may cause death within seconds. Seasonal allergies occur in all age groups. These are seasonal because they usually occur during the same season every year. They may be a reaction to molds, grass pollens, or tree pollens. Other causes of problems are house dust mite allergens, pet dander, and mold spores. The symptoms often consist of nasal congestion, a runny itchy nose associated with sneezing, and tearing itchy eyes. There is often an associated itching of the mouth and ears. The problems happen when you come in contact with pollens and other allergens. Allergens are the particles in the air that the body reacts to with an allergic reaction. This causes you to release allergic antibodies. Through a chain of events, these eventually cause you to release histamine into the blood stream. Although it is meant to be protective to the body, it is this release that causes your discomfort. This is why you were given anti-histamines to feel better. If you are  unable to pinpoint the offending allergen, it may be determined by skin or blood testing. Allergies cannot be cured but can be controlled with medicine. Hay fever is a collection of all or some of the seasonal allergy problems. It may often be treated with simple over-the-counter medicine such as diphenhydramine. Take medicine as directed. Do not drink alcohol or drive while taking this medicine. Check with your caregiver or package insert for child dosages. If these medicines are not effective, there are many new medicines your caregiver can prescribe. Stronger medicine such as nasal spray, eye drops, and corticosteroids may be used if the first things you try do not work well. Other treatments such as immunotherapy or desensitizing injections can be used if all else fails. Follow up with your caregiver if problems continue. These seasonal allergies are usually not life threatening. They are generally more of a nuisance that can often be handled using medicine. HOME CARE INSTRUCTIONS  If unsure what causes a reaction, keep a diary of foods eaten and symptoms that follow. Avoid foods that cause reactions.   If hives or rash are present:   Take medicine as directed.   You may use an over-the-counter antihistamine (diphenhydramine) for hives and itching as needed.   Apply cold compresses (cloths) to the skin or take baths in cool water. Avoid hot baths or showers. Heat will make a rash and itching worse.   If you are severely allergic:   Following a treatment for a severe reaction, hospitalization is often required for closer follow-up.   Wear a medic-alert bracelet or necklace stating the allergy.     You and your family must learn how to give adrenaline or use an anaphylaxis kit.   If you have had a severe reaction, always carry your anaphylaxis kit or EpiPen with you. Use this medicine as directed by your caregiver if a severe reaction is occurring. Failure to do so could have a fatal outcome.   SEE YOUR CAREGIVER IF:  You suspect a food allergy. Symptoms generally happen within 30 minutes of eating a food.   Your symptoms have not gone away within 2 days or are getting worse.   You develop new symptoms.   You want to retest yourself or your child with a food or drink you think causes an allergic reaction. Never do this if an anaphylactic reaction to that food or drink has happened before. Only do this under the care of a caregiver.  SEEK IMMEDIATE MEDICAL CARE IF:  You have difficulty breathing, are wheezing, or have a tight feeling in your chest or throat.   You have a swollen mouth, or you have hives, swelling, or itching all over your body.   You have had a severe reaction that has responded to your anaphylaxis kit or an EpiPen. These reactions may return when the medicine has worn off. These reactions should be considered life threatening.  MAKE SURE YOU:   Understand these instructions.   Will watch your condition.   Will get help right away if you are not doing well or get worse.  Document Released: 01/11/2003 Document Re-Released: 11/09/2009 [employer] Patient Information 2011 [employer]. 

## 2011-07-01 ENCOUNTER — Ambulatory Visit (INDEPENDENT_AMBULATORY_CARE_PROVIDER_SITE_OTHER): Payer: BC Managed Care – PPO | Admitting: Family Medicine

## 2011-07-01 ENCOUNTER — Encounter: Payer: Self-pay | Admitting: Family Medicine

## 2011-07-01 DIAGNOSIS — R0989 Other specified symptoms and signs involving the circulatory and respiratory systems: Secondary | ICD-10-CM

## 2011-07-01 DIAGNOSIS — G43909 Migraine, unspecified, not intractable, without status migrainosus: Secondary | ICD-10-CM

## 2011-07-01 DIAGNOSIS — J302 Other seasonal allergic rhinitis: Secondary | ICD-10-CM

## 2011-07-01 DIAGNOSIS — J309 Allergic rhinitis, unspecified: Secondary | ICD-10-CM

## 2011-07-01 MED ORDER — TOPIRAMATE 100 MG PO TABS: 100.0000 mg | ORAL_TABLET | Freq: Every day | ORAL | Status: DC

## 2011-07-01 NOTE — Progress Notes (Signed)
  Subjective:    Patient ID: Michael Pearson, male    DOB: [birth year], 50 y.o.   MRN: [id number]  HPI Pt here f/u migraines and doe.  Pt states he had 1 headache 2 days ago that was bad but he was at work and it eventually subsided.  Pt still c/o doe---inhalers and nasal spray does not help.  Pt feels like he is always congested.     Review of Systems    as above Objective:   Physical Exam  Constitutional: He is oriented to person, place, and time. He appears well-developed and well-nourished.  HENT:  Right Ear: External ear normal.  Left Ear: External ear normal.       + nasal congestion  Neck: Neck supple.  Cardiovascular: Normal rate and regular rhythm.   Pulmonary/Chest: Effort normal and breath sounds normal. No respiratory distress. He has no wheezes. He has no rales. He exhibits no tenderness.  Neurological: He is alert and oriented to person, place, and time.  Psychiatric: He has a normal mood and affect. His behavior is normal.          Assessment & Plan:  Allergies and DOE---- con't meds and inhalers---add antihistamine and refer to allergy/ pulm                                     Migraines--improved--- but if no con't improvement after allergist consider f/u headache center--pt has been there in past

## 2011-07-01 NOTE — Patient Instructions (Signed)
Allergic Rhinitis Allergic rhinitis is when the mucous membranes in the nose respond to allergens. Allergens are particles in the air that cause your body to have an allergic reaction. This causes you to release allergic antibodies. Through a chain of events, these eventually cause you to release histamine into the blood stream (hence the use of antihistamines). Although meant to be protective to the body, it is this release that causes your discomfort, such as frequent sneezing, congestion and an itchy runny nose.  CAUSES The pollen allergens may come from grasses, trees, and weeds. This is seasonal allergic rhinitis, or "hay fever." Other allergens cause year-round allergic rhinitis (perennial allergic rhinitis) such as house dust mite allergen, pet dander and mold spores.  SYMPTOMS  Nasal stuffiness (congestion).   Runny, itchy nose with sneezing and tearing of the eyes.   There is often an itching of the mouth, eyes and ears.  It cannot be cured, but it can be controlled with medications. DIAGNOSIS If you are unable to determine the offending allergen, skin or blood testing may find it. TREATMENT  Avoid the allergen.   Medications and allergy shots (immunotherapy) can help.   Hay fever may often be treated with antihistamines in pill or nasal spray forms. Antihistamines block the effects of histamine. There are over-the-counter medicines that may help with nasal congestion and swelling around the eyes. Check with your caregiver before taking or giving this medicine.  If the treatment above does not work, there are many new medications your caregiver can prescribe. Stronger medications may be used if initial measures are ineffective. Desensitizing injections can be used if medications and avoidance fails. Desensitization is when a patient is given ongoing shots until the body becomes less sensitive to the allergen. Make sure you follow up with your caregiver if problems continue. SEEK  MEDICAL CARE IF:   You develop fever (more than 100.5F (38.1 C).   You develop a cough that does not stop easily (persistent).   You have shortness of breath.   You start wheezing.   Symptoms interfere with normal daily activities.  Document Released: 07/13/2001 Document Re-Released: 11/09/2009 [employer] Patient Information 2011 [employer]. 

## 2011-07-16 ENCOUNTER — Ambulatory Visit (INDEPENDENT_AMBULATORY_CARE_PROVIDER_SITE_OTHER): Payer: BC Managed Care – PPO | Admitting: Family Medicine

## 2011-07-16 ENCOUNTER — Encounter: Payer: Self-pay | Admitting: Family Medicine

## 2011-07-16 VITALS — BP 110/78 | HR 61 | Temp 97.6°F | Wt 234.0 lb

## 2011-07-16 DIAGNOSIS — J40 Bronchitis, not specified as acute or chronic: Secondary | ICD-10-CM

## 2011-07-16 DIAGNOSIS — J302 Other seasonal allergic rhinitis: Secondary | ICD-10-CM

## 2011-07-16 MED ORDER — CETIRIZINE HCL 10 MG PO CAPS: ORAL_CAPSULE | ORAL | Status: DC

## 2011-07-16 MED ORDER — AZITHROMYCIN 250 MG PO TABS: ORAL_TABLET | ORAL | Status: DC

## 2011-07-16 NOTE — Patient Instructions (Signed)
Bronchitis Bronchitis is the body's way of reacting to injury and/or infection (inflammation) of the bronchi. Bronchi are the air tubes that extend from the windpipe into the lungs. If the inflammation becomes severe, it may cause shortness of breath.  CAUSES Inflammation may be caused by:  A virus.   Germs (bacteria).   Dust.   Allergens.   Pollutants and many other irritants.  The cells lining the bronchial tree are covered with tiny hairs (cilia). These constantly beat upward, away from the lungs, toward the mouth. This keeps the lungs free of pollutants. When these cells become too irritated and are unable to do their job, mucus begins to develop. This causes the characteristic cough of bronchitis. The cough clears the lungs when the cilia are unable to do their job. Without either of these protective mechanisms, the mucus would settle in the lungs. Then you would develop pneumonia. Smoking is a common cause of bronchitis and can contribute to pneumonia. Stopping this habit is the single most important thing you can do to help yourself. TREATMENT  Your caregiver may prescribe an antibiotic if the cough is caused by bacteria. Also, medicines that open up your airways make it easier to breathe. Your caregiver may also recommend or prescribe an expectorant. It will loosen the mucus to be coughed up. Only take over-the-counter or prescription medicines for pain, discomfort, or fever as directed by your caregiver.   Removing whatever causes the problem (smoking, for example) is critical to preventing the problem from getting worse.   Cough suppressants may be prescribed for relief of cough symptoms.   Inhaled medicines may be prescribed to help with symptoms now and to help prevent problems from returning.   For those with recurrent (chronic) bronchitis, there may be a need for steroid medicines.  SEEK IMMEDIATE MEDICAL CARE IF:  During treatment, you develop more pus-like mucus  (purulent sputum).   You or your child has an oral temperature above 100.4, not controlled by medicine.   Your baby is older than 3 months with a rectal temperature of 102 F (38.9 C) or higher.   Your baby is 3 months old or younger with a rectal temperature of 100.4 F (38 C) or higher.   You become progressively more ill.   You have increased difficulty breathing, wheezing, or shortness of breath.  It is necessary to seek immediate medical care if you are elderly or sick from any other disease. MAKE SURE YOU:  Understand these instructions.   Will watch your condition.   Will get help right away if you are not doing well or get worse.  Document Released: 10/18/2005 Document Re-Released: 01/12/2010 [employer] Patient Information 2011 [employer]. 

## 2011-07-16 NOTE — Progress Notes (Signed)
  Subjective:     Michael Pearson is a 50 y.o. male here for evaluation of a cough. Onset of symptoms was 2 days ago. Symptoms have been gradually worsening since that time. The cough is productive and is aggravated by exercise and reclining position. Associated symptoms include: shortness of breath, sputum production and wheezing. Patient does not have a history of asthma. Patient does have a history of environmental allergens. Patient has not traveled recently. Patient does not have a history of smoking. Patient has not had a previous chest x-ray. Patient has not had a PPD done.  The following portions of the patient's history were reviewed and updated as appropriate: allergies, current medications, past family history, past medical history, past social history, past surgical history and problem list.  Review of Systems Pertinent items are noted in HPI.    Objective:    Oxygen saturation 97% on room air BP 110/78  Pulse 61  Temp(Src) 97.6 F (36.4 C) (Oral)  Wt 234 lb (106.142 kg)  SpO2 97% General appearance: alert, cooperative, appears stated age and no distress Eyes: conjunctivae/corneas clear. PERRL, EOM's intact. Fundi benign. Ears: normal TM's and external ear canals both ears Nose: Nares normal. Septum midline. Mucosa normal. No drainage or sinus tenderness. Throat: lips, mucosa, and tongue normal; teeth and gums normal Neck: no adenopathy, no carotid bruit, no JVD, supple, symmetrical, trachea midline and thyroid not enlarged, symmetric, no tenderness/mass/nodules Lungs: rhonchi bilaterally Heart: S1, S2 normal    Assessment:    Acute Bronchitis    Plan:    Antibiotics per medication orders. Avoid exposure to tobacco smoke and fumes. Call if shortness of breath worsens, blood in sputum, change in character of cough, development of fever or chills, inability to maintain nutrition and hydration. Avoid exposure to tobacco smoke and fumes. f/u pulm

## 2011-07-19 ENCOUNTER — Other Ambulatory Visit: Payer: Self-pay | Admitting: Family Medicine

## 2011-07-22 ENCOUNTER — Ambulatory Visit (INDEPENDENT_AMBULATORY_CARE_PROVIDER_SITE_OTHER): Payer: BC Managed Care – PPO | Admitting: Emergency Medicine

## 2011-07-22 ENCOUNTER — Encounter: Payer: Self-pay | Admitting: Emergency Medicine

## 2011-07-22 DIAGNOSIS — R05 Cough: Secondary | ICD-10-CM

## 2011-07-22 DIAGNOSIS — R0609 Other forms of dyspnea: Secondary | ICD-10-CM

## 2011-07-22 NOTE — Progress Notes (Deleted)
50 yo man, never smoker, hx of HTN, CAD s/p MI, allergies. Referred by Dr Laury Axon for DOE.

## 2011-07-22 NOTE — Assessment & Plan Note (Signed)
?   Contribution of his CAD - the SOB has improved since he had PTCI in July. Still bothers him, however. He did have remote inhalational injury, no evidence abnormality on CXR 7/12.  - full PFT to assess for pulm contribution to his dyspnea

## 2011-07-22 NOTE — Assessment & Plan Note (Signed)
?   Contribution of allergies (on zyrtec). May have worsened since July when started on ACE-I.  - continue zyrtec - if still coughing in Oct when he sees Dr Excell Seltzer, ? Consider changing ACE-I to an ARB.

## 2011-07-22 NOTE — Progress Notes (Signed)
Subjective:    Patient ID: Michael Pearson, male    DOB: [birth year], 50 y.o.   MRN: [id number]  HPI 50 yo never smoker, hx HTN, CAD s/p MI and PTCI 7/12, allergies. Referred for DOE by Dr Laury Axon.  He has been dealing with exertional SOB for years, associated with migraines HA and sinus congestion, fullness. He relates this to an exposure to battery acid about 9 yrs ago; he poured city water (chlorinated) onto battery acid, likely had a HCl inhalational injury. He has SOB with walking, worse w heat. For the last few weeks has had a cough, initially productive mucous, but then dry. He started lisinopril after the MI and cough worsened.    Review of Systems  Constitutional: Negative.  Negative for fever, activity change, appetite change and fatigue.  HENT: Positive for congestion and sneezing. Negative for rhinorrhea, postnasal drip and sinus pressure.   Eyes: Negative.   Respiratory: Positive for cough (baseline, in the am - prod clear/white) and shortness of breath. Negative for chest tightness, wheezing and stridor.   Cardiovascular: Negative.  Negative for chest pain.  Gastrointestinal: Negative.        Heartburn at night  Genitourinary: Negative.   Musculoskeletal: Positive for arthralgias. Negative for back pain.  Skin: Negative.   Neurological: Positive for headaches.  Hematological: Bruises/bleeds easily.  Psychiatric/Behavioral: Negative.    Past Medical History  Diagnosis Date  . Migraines     Has been evaluated multiple times in past for chronic headache in which pt has had occasional nose bleeds and bloodshot eyes. This lasted for 4-5 months. CT of the head was negative in May 2012.  Michael Pearson     MRI in 2008 demonstrating 4.6 mm area of pituitary gland   . Duodenitis     May 2012 with heme positive stools at this time. Endorses only rare streaking of blood now.  . Colitis   . CAD (coronary artery disease)     NSTEMI 7/12:  Cardiac cath on 7/17: pLAD occluded, Dx 30-40%,  pRCA 30-40%, mRCA 30-40%.  Proximal LAD was treated with a BMS.  Echo 7/19:  EF 60-65%, mild LVH  . RBBB (right bundle branch block)     Noted on EKG in 2008  . Kidney stones   . Hyperlipidemia   . Hypertension   . Myocardial infarction 05/17/2011     Family History  Problem Relation Age of Onset  . Stroke Mother   . Heart attack Father 31    MI  . Heart disease Father 1    MI  . Obesity Brother   . Coronary artery disease Brother     Possible, pt not sure of specifics  . Hypertension Brother   . Skin cancer       History   Social History  . Marital Status: Single    Spouse Name: N/A    Number of Children: N/A  . Years of Education: N/A   Occupational History  . [employer]-- cleans 3 buildings     Fairly physical job  .     Social History Main Topics  . Smoking status: Never Smoker   . Smokeless tobacco: Not on file  . Alcohol Use: No  . Drug Use: No  . Sexually Active: Not Currently   Other Topics Concern  . Not on file   Social History Narrative   Lives with brother and motherMother is quite sick, he and his brother take turns taking care of herNo children  No Known Allergies   Outpatient Prescriptions Prior to Visit  Medication Sig Dispense Refill  . acetaminophen (TYLENOL) 325 MG tablet Take 650 mg by mouth 2 (two) times daily as needed.        Marland Kitchen aspirin 81 MG tablet Take 81 mg by mouth daily.        Marland Kitchen atorvastatin (LIPITOR) 40 MG tablet Take 40 mg by mouth at bedtime.        . beclomethasone (QVAR) 40 MCG/ACT inhaler Inhale 2 puffs into the lungs 2 (two) times daily.  1 Inhaler  12  . Cetirizine HCl (ZYRTEC ALLERGY) 10 MG CAPS 1 po qd  30 capsule    . lisinopril (PRINIVIL,ZESTRIL) 2.5 MG tablet Take 2.5 mg by mouth daily.        . metoprolol tartrate (LOPRESSOR) 25 MG tablet 25 mg. 1/2 tab every morning and 1 tab every evening      . mometasone (NASONEX) 50 MCG/ACT nasal spray Place 2 sprays into the nose daily.  17 g  2  . Multiple Vitamin  (MULTIVITAMIN) tablet Take 1 tablet by mouth daily.        . nitroGLYCERIN (NITROSTAT) 0.4 MG SL tablet Place 0.4 mg under the tongue every 5 (five) minutes as needed.        . pantoprazole (PROTONIX) 40 MG tablet       . prasugrel (EFFIENT) 10 MG TABS Take 10 mg by mouth daily.        . pravastatin (PRAVACHOL) 40 MG tablet       . ranitidine (ZANTAC) 150 MG capsule Take 150 mg by mouth 2 (two) times daily.        Marland Kitchen azithromycin (ZITHROMAX Z-PAK) 250 MG tablet As directed  6 each  0  . topiramate (TOPAMAX) 100 MG tablet Take 1 tablet (100 mg total) by mouth at bedtime.  30 tablet  2       Objective:   Physical Exam  Gen: Pleasant, well-nourished, in no distress,  normal affect  ENT: No lesions,  mouth clear,  oropharynx clear, no postnasal drip  Neck: No JVD, no TMG, no carotid bruits  Lungs: No use of accessory muscles, no dullness to percussion, clear without rales or rhonchi  Cardiovascular: RRR, heart sounds normal, no murmur or gallops, no peripheral edema  Musculoskeletal: No deformities, no cyanosis or clubbing  Neuro: alert, non focal  Skin: Warm, no lesions or rashes     Assessment & Plan:

## 2011-07-22 NOTE — Patient Instructions (Signed)
We will set up full pulmonary function testing at the time of your next office visit Continue your zyrtec daily Follow with Dr Delton Coombes next available with PFT

## 2011-08-04 ENCOUNTER — Encounter: Payer: Self-pay | Admitting: Cardiovascular Disease

## 2011-08-04 ENCOUNTER — Other Ambulatory Visit: Payer: BC Managed Care – PPO | Admitting: *Deleted

## 2011-08-04 ENCOUNTER — Ambulatory Visit (INDEPENDENT_AMBULATORY_CARE_PROVIDER_SITE_OTHER): Payer: BC Managed Care – PPO | Admitting: Cardiovascular Disease

## 2011-08-04 DIAGNOSIS — I2510 Atherosclerotic heart disease of native coronary artery without angina pectoris: Secondary | ICD-10-CM

## 2011-08-04 DIAGNOSIS — I214 Non-ST elevation (NSTEMI) myocardial infarction: Secondary | ICD-10-CM

## 2011-08-04 DIAGNOSIS — R05 Cough: Secondary | ICD-10-CM

## 2011-08-04 DIAGNOSIS — E785 Hyperlipidemia, unspecified: Secondary | ICD-10-CM

## 2011-08-04 DIAGNOSIS — I10 Essential (primary) hypertension: Secondary | ICD-10-CM

## 2011-08-04 DIAGNOSIS — E7800 Pure hypercholesterolemia, unspecified: Secondary | ICD-10-CM

## 2011-08-04 DIAGNOSIS — I252 Old myocardial infarction: Secondary | ICD-10-CM

## 2011-08-04 LAB — LIPID PANEL
Cholesterol: 114 mg/dL (ref 0–200)
HDL: 51 mg/dL (ref >39.00)
Triglycerides: 50 mg/dL (ref 0.0–149.0)
VLDL: 10 mg/dL (ref 0.0–40.0)

## 2011-08-04 LAB — HEPATIC FUNCTION PANEL
ALT: 29 U/L (ref 0–53)
AST: 27 U/L (ref 0–37)
Total Bilirubin: 0.9 mg/dL (ref 0.3–1.2)
Total Protein: 7.3 g/dL (ref 6.0–8.3)

## 2011-08-04 MED ORDER — LOSARTAN POTASSIUM 25 MG PO TABS: 25.0000 mg | ORAL_TABLET | Freq: Every day | ORAL | Status: DC

## 2011-08-04 NOTE — Progress Notes (Signed)
HPI:  This is a 50 year old gentleman presenting for followup evaluation. The patient underwent PCI of the LAD in July 2012. A bare metal stent platform was utilized. He presented essentially with a non-ST elevation infarction but was found to have subtotal occlusion of the LAD. His initial left ventricular ejection fraction was estimated at about 40% by ventriculography, but echocardiography just prior to hospital discharge showed normalization of LV function with an LVEF estimated at 60-65%.  The patient complains of dyspnea and fatigue with exertion. His symptoms are worse since his MI. He also complains of occasional chest pressure related to exertion. He feels tired much of the time. He complains of a nonproductive cough. He denies orthopnea, PND, or edema. He denies fever or chills.  Outpatient Encounter Prescriptions as of 08/04/2011  Medication Sig Dispense Refill  . acetaminophen (TYLENOL) 325 MG tablet Take 650 mg by mouth 2 (two) times daily as needed.        Marland Kitchen aspirin 81 MG tablet Take 81 mg by mouth daily.        Marland Kitchen atorvastatin (LIPITOR) 40 MG tablet Take 40 mg by mouth at bedtime.        . beclomethasone (QVAR) 40 MCG/ACT inhaler Inhale 2 puffs into the lungs 2 (two) times daily.  1 Inhaler  12  . Cetirizine HCl (ZYRTEC ALLERGY) 10 MG CAPS 1 po qd  30 capsule    . lisinopril (PRINIVIL,ZESTRIL) 2.5 MG tablet Take 2.5 mg by mouth daily.        . metoprolol tartrate (LOPRESSOR) 25 MG tablet 25 mg. 1/2 tab every morning and 1 tab every evening      . mometasone (NASONEX) 50 MCG/ACT nasal spray Place 2 sprays into the nose daily.  17 g  2  . Multiple Vitamin (MULTIVITAMIN) tablet Take 1 tablet by mouth daily.        . nitroGLYCERIN (NITROSTAT) 0.4 MG SL tablet Place 0.4 mg under the tongue every 5 (five) minutes as needed.        . pantoprazole (PROTONIX) 40 MG tablet       . prasugrel (EFFIENT) 10 MG TABS Take 10 mg by mouth daily.        . pravastatin (PRAVACHOL) 40 MG tablet       .  ranitidine (ZANTAC) 150 MG capsule Take 150 mg by mouth 2 (two) times daily.          No Known Allergies  Past Medical History  Diagnosis Date  . Migraines     Has been evaluated multiple times in past for chronic headache in which pt has had occasional nose bleeds and bloodshot eyes. This lasted for 4-5 months. CT of the head was negative in May 2012.  [name]     MRI in 2008 demonstrating 4.6 mm area of pituitary gland   . Duodenitis     May 2012 with heme positive stools at this time. Endorses only rare streaking of blood now.  . Colitis   . CAD (coronary artery disease)     NSTEMI 7/12:  Cardiac cath on 7/17: pLAD occluded, Dx 30-40%, pRCA 30-40%, mRCA 30-40%.  Proximal LAD was treated with a BMS.  Echo 7/19:  EF 60-65%, mild LVH  . RBBB (right bundle branch block)     Noted on EKG in 2008  . Kidney stones   . Hyperlipidemia   . Hypertension   . Myocardial infarction 05/17/2011    ROS: Negative except as per HPI  BP 117/79  Pulse 68  Ht 6\' 2"  (1.88 m)  Wt 227 lb 12.8 oz (103.329 kg)  BMI 29.25 kg/m2  PHYSICAL EXAM: Pt is alert and oriented, NAD HEENT: normal Neck: JVP - normal, carotids 2+= without bruits Lungs: CTA bilaterally CV: RRR without murmur or gallop Abd: soft, NT, Positive BS, no hepatomegaly Ext: no C/C/E, distal pulses intact and equal Skin: warm/dry no rash  ASSESSMENT AND PLAN:

## 2011-08-04 NOTE — Assessment & Plan Note (Signed)
Unclear etiology but the patient was started on lisinopril during his hospital stay. Will discontinue this and start him on losartan 25 mg daily.

## 2011-08-04 NOTE — Patient Instructions (Signed)
Your physician has requested that you have a lexiscan myoview. For further information please visit [email or web address] Please follow instruction sheet, as given.  Please stop your Lisinopril and start Losartan 25 mg a day. Continue all other medications as listed.  Follow up with Dr Excell Seltzer in 3 months.

## 2011-08-04 NOTE — Progress Notes (Signed)
Addended by: Alma Friendly on: 08/04/2011 09:57 AM   Modules accepted: Orders

## 2011-08-04 NOTE — Assessment & Plan Note (Signed)
The patient has symptoms of exercise intolerance, dyspnea, and chest pain with both typical and atypical features. He underwent recent PCI utilizing a bare-metal stent in the LAD. I think he needs a followup nuclear stress scan to rule out obstructive CAD and myocardial ischemia as an etiology of his symptoms. He otherwise will continue on dual antiplatelet therapy with aspirin and effient. I will see him back in followup in 3 months.

## 2011-08-04 NOTE — Assessment & Plan Note (Signed)
The patient is on atorvastatin 40 mg daily. He will have followup lipids drawn today. In review of his medication list, I see pravastatin on the list as well. We need to make sure that this is not an active medication and we will contact him regarding this.

## 2011-08-05 ENCOUNTER — Encounter: Payer: Self-pay | Admitting: *Deleted

## 2011-08-06 LAB — URINALYSIS, ROUTINE W REFLEX MICROSCOPIC
Bilirubin Urine: NEGATIVE
Glucose, UA: NEGATIVE
Hgb urine dipstick: NEGATIVE
Specific Gravity, Urine: 1.024

## 2011-08-06 LAB — POCT CARDIAC MARKERS: Myoglobin, poc: 79

## 2011-08-06 LAB — BASIC METABOLIC PANEL
CO2: 28
Calcium: 9.3
Creatinine, Ser: 0.79
GFR calc Af Amer: >60
GFR calc non Af Amer: >60
Glucose, Bld: 94

## 2011-08-06 LAB — CBC
Platelets: 196
RDW: 13.6

## 2011-08-06 LAB — DIFFERENTIAL
Basophils Absolute: 0.1
Eosinophils Relative: 4
Lymphocytes Relative: 36
Neutro Abs: 3.3

## 2011-08-12 ENCOUNTER — Other Ambulatory Visit: Payer: Self-pay | Admitting: Family Medicine

## 2011-08-18 ENCOUNTER — Ambulatory Visit (HOSPITAL_COMMUNITY): Payer: BC Managed Care – PPO | Attending: Cardiovascular Disease | Admitting: Radiology

## 2011-08-18 DIAGNOSIS — I10 Essential (primary) hypertension: Secondary | ICD-10-CM | POA: Insufficient documentation

## 2011-08-18 DIAGNOSIS — I252 Old myocardial infarction: Secondary | ICD-10-CM

## 2011-08-18 DIAGNOSIS — I2510 Atherosclerotic heart disease of native coronary artery without angina pectoris: Secondary | ICD-10-CM

## 2011-08-18 DIAGNOSIS — R0609 Other forms of dyspnea: Secondary | ICD-10-CM

## 2011-08-18 DIAGNOSIS — R079 Chest pain, unspecified: Secondary | ICD-10-CM

## 2011-08-18 MED ORDER — REGADENOSON 0.4 MG/5ML IV SOLN
0.4000 mg | Freq: Once | INTRAVENOUS | Status: AC
  Administered 2011-08-18: 0.4 mg via INTRAVENOUS

## 2011-08-18 MED ORDER — TECHNETIUM TC 99M TETROFOSMIN IV KIT
11.0000 | PACK | Freq: Once | INTRAVENOUS | Status: AC | PRN
  Administered 2011-08-18: 11 via INTRAVENOUS

## 2011-08-18 MED ORDER — TECHNETIUM TC 99M TETROFOSMIN IV KIT
33.0000 | PACK | Freq: Once | INTRAVENOUS | Status: AC | PRN
  Administered 2011-08-18: 33 via INTRAVENOUS

## 2011-08-18 NOTE — Progress Notes (Signed)
[facility] SITE 3 NUCLEAR MED [address] [place] [address] [phone]  Cardiology Nuclear Med Study  Michael Pearson is a 50 y.o. male [id number] [birth year]   Nuclear Med Background Indication for Stress Test:  Evaluation for Ischemia and Stent Patency History: 7/12 Echo- EF 60-65%,7/12  Heart Catheterization occl. LAD,7/12 Myocardial Infarction and 7/12 Stent- LAD Cardiac Risk Factors: Hypertension, Lipids and RBBB  Symptoms:  Chest Tightness (last date of chest discomfort 2-3 weeks), DOE, Fatigue, Fatigue with Exertion and Rapid HR   Nuclear Pre-Procedure Caffeine/Decaff Intake:  None NPO After: 8:00pm   Lungs:  Clear IV 0.9% NS with Angio Cath:  20g  IV Site: R Antecubital  IV Started by:  Stanton Kidney, EMT-P  Chest Size (in):  44 Cup Size: n/a  Height: 6\' 2"  (1.88 m)  Weight:  222 lb (100.699 kg)  BMI:  Body mass index is 28.50 kg/(m^2). Tech Comments:  Metoprolol held per patient.    Nuclear Med Study 1 or 2 day study: 1 day  Stress Test Type:  Treadmill/Lexiscan  Reading MD: Marca Ancona, MD  Order Authorizing Provider:  Dr. Dayle Points  Resting Radionuclide: Technetium 35m Tetrofosmin  Resting Radionuclide Dose: 11.0 mCi   Stress Radionuclide:  Technetium 1m Tetrofosmin  Stress Radionuclide Dose: 33.0 mCi           Stress Protocol Rest HR: 49 Stress HR: 117  Rest BP: 122/82 Stress BP: 122/64  Exercise Time (min): n/a METS: n/a   Predicted Max HR: 170 bpm % Max HR: 68.82 bpm Rate Pressure Product: 86578   Dose of Adenosine (mg):  n/a Dose of Lexiscan: 0.4 mg  Dose of Atropine (mg): n/a Dose of Dobutamine: n/a mcg/kg/min (at max HR)  Stress Test Technologist: Bonnita Levan, RN  Nuclear Technologist:  Domenic Polite, CNMT     Rest Procedure:  Myocardial perfusion imaging was performed at rest 45 minutes following the intravenous administration of Technetium 85m Tetrofosmin. Rest ECG: NSR-RBBB  Stress Procedure:  The patient received IV  Lexiscan 0.4 mg over 15-seconds with concurrent low level exercise and then Technetium 69m Tetrofosmin was injected at 30-seconds while the patient continued walking one more minute.  There were no significant changes with Lexiscan.  Quantitative spect images were obtained after a 45-minute delay. Stress ECG: No significant change from baseline ECG  QPS Raw Data Images:  Normal; no motion artifact; normal heart/lung ratio. Stress Images:  Mild inferior perfusion defect.  Rest Images:  Mild inferior perfusion defect.  Subtraction (SDS):  Mild fixed inferior perfusion defect.  Transient Ischemic Dilatation (Normal <1.22):  1.0 Lung/Heart Ratio (Normal <0.45):  0.29  Quantitative Gated Spect Images QGS EDV:  117 ml QGS ESV:  49 ml QGS cine images:  NL LV Function; NL Wall Motion QGS EF: 58%  Impression Exercise Capacity:  Lexiscan with no exercise. BP Response:  Normal blood pressure response. Clinical Symptoms:  Chest tightness.  ECG Impression:  RBBB, no change with infusion.  Comparison with Prior Nuclear Study: No images to compare  Overall Impression:  Mild, fixed inferior perfusion defect likely represents diaphragmatic attenuation.  There is no evidence for ischemia or infarction.  EF is 58% with normal wall motion.    Rada Zegers [employer]

## 2011-08-31 ENCOUNTER — Ambulatory Visit (INDEPENDENT_AMBULATORY_CARE_PROVIDER_SITE_OTHER): Payer: BC Managed Care – PPO | Admitting: Emergency Medicine

## 2011-08-31 ENCOUNTER — Encounter: Payer: Self-pay | Admitting: Emergency Medicine

## 2011-08-31 DIAGNOSIS — R0609 Other forms of dyspnea: Secondary | ICD-10-CM

## 2011-08-31 DIAGNOSIS — R05 Cough: Secondary | ICD-10-CM

## 2011-08-31 DIAGNOSIS — R0989 Other specified symptoms and signs involving the circulatory and respiratory systems: Secondary | ICD-10-CM

## 2011-08-31 LAB — PULMONARY FUNCTION TEST

## 2011-08-31 MED ORDER — ALBUTEROL SULFATE HFA 108 (90 BASE) MCG/ACT IN AERS: 2.0000 | INHALATION_SPRAY | RESPIRATORY_TRACT | Status: DC | PRN

## 2011-08-31 NOTE — Progress Notes (Signed)
PFT done today. 

## 2011-08-31 NOTE — Assessment & Plan Note (Signed)
-   agree with change from ACE-I to ARB - allergy regimen as he is taking

## 2011-08-31 NOTE — Assessment & Plan Note (Signed)
PFT support mild asthma based on BD response.  - continue QVAR for now, add SABA prn

## 2011-08-31 NOTE — Progress Notes (Signed)
  Subjective:    Patient ID: Michael Pearson, male    DOB: [birth year], 50 y.o.   MRN: [id number]  HPI 50 yo never smoker, hx HTN, CAD s/p MI and PTCI 7/12, allergies. Referred for DOE by Dr Laury Axon.  He has been dealing with exertional SOB for years, associated with migraines HA and sinus congestion, fullness. He relates this to an exposure to battery acid about 9 yrs ago; he poured city water (chlorinated) onto battery acid, likely had a HCl inhalational injury. He has SOB with walking, worse w heat. For the last few weeks has had a cough, initially productive mucous, but then dry. He started lisinopril after the MI and cough worsened.   ROV 08/31/11 -- f/u for dyspnea. He has CAD s/p PTCI. His breathing improved some after the PTCI, but still bothersome so we did PFT. 10/3 Dr Excell Seltzer changed ACE-I to to ARB - cough some better. He is taking QVAR bid. PFT today show possible mild AFL based on BD response. He doesn't have a rescue inhaler. He is on nasonex and zyrtec for PND and allergies.    PULMONARY FUNCTON TEST 08/31/2011  FVC 5.3  FEV1 4.32  FEV1/FVC 81.5  FVC  % Predicted 97  FEV % Predicted 109  FeF 25-75 4.28  FeF 25-75 % Predicted 3.74       Objective:   Physical Exam  Gen: Pleasant, well-nourished, in no distress,  normal affect  ENT: No lesions,  mouth clear,  oropharynx clear, no postnasal drip  Neck: No JVD, no TMG, no carotid bruits  Lungs: No use of accessory muscles, no dullness to percussion, clear without rales or rhonchi  Cardiovascular: RRR, heart sounds normal, no murmur or gallops, no peripheral edema  Musculoskeletal: No deformities, no cyanosis or clubbing  Neuro: alert, non focal  Skin: Warm, no lesions or rashes     Assessment & Plan:  DOE (dyspnea on exertion) PFT support mild asthma based on BD response.  - continue QVAR for now, add SABA prn   Cough - agree with change from ACE-I to ARB - allergy regimen as he is taking

## 2011-08-31 NOTE — Patient Instructions (Signed)
Please continue your QVAR twice a day Start using Ventolin 2 puffs if needed for shortness of breath Continue your nasonex spray Follow with Dr Delton Coombes in 1 year or sooner if you have any problems.

## 2011-09-29 ENCOUNTER — Other Ambulatory Visit: Payer: Self-pay | Admitting: Family Medicine

## 2011-12-10 ENCOUNTER — Other Ambulatory Visit: Payer: Self-pay | Admitting: Cardiovascular Disease

## 2012-03-02 ENCOUNTER — Other Ambulatory Visit: Payer: Self-pay | Admitting: Cardiovascular Disease

## 2012-03-02 ENCOUNTER — Telehealth: Payer: Self-pay

## 2012-03-02 NOTE — Telephone Encounter (Signed)
Called Michael Pearson about making appointment with Dr Excell Seltzer About make for 05-10-2012 At [phone]

## 2012-03-02 NOTE — Telephone Encounter (Signed)
Made an appointment With Dr Excell Seltzer.

## 2012-03-02 NOTE — Telephone Encounter (Signed)
..   Requested Prescriptions   Pending Prescriptions Disp Refills  . losartan (COZAAR) 25 MG tablet [Pharmacy Med Name: LOSARTAN POTASSIUM 25 MG TAB] 30 tablet 2    Sig: TAKE 1 TABLET (25 MG TOTAL) BY MOUTH DAILY.  Patient needs to make an appointment

## 2012-03-10 ENCOUNTER — Ambulatory Visit: Payer: BC Managed Care – PPO

## 2012-03-10 ENCOUNTER — Ambulatory Visit (INDEPENDENT_AMBULATORY_CARE_PROVIDER_SITE_OTHER): Payer: BC Managed Care – PPO | Admitting: Family Medicine

## 2012-03-10 VITALS — BP 136/88 | HR 86 | Temp 97.8°F | Resp 18 | Ht 73.0 in | Wt 231.2 lb

## 2012-03-10 DIAGNOSIS — T148XXA Other injury of unspecified body region, initial encounter: Secondary | ICD-10-CM

## 2012-03-10 DIAGNOSIS — M545 Low back pain, unspecified: Secondary | ICD-10-CM

## 2012-03-10 MED ORDER — METHOCARBAMOL 500 MG PO TABS: 500.0000 mg | ORAL_TABLET | Freq: Three times a day (TID) | ORAL | Status: AC | PRN

## 2012-03-10 MED ORDER — METHYLPREDNISOLONE 4 MG PO KIT: PACK | ORAL | Status: AC

## 2012-03-10 MED ORDER — TRAMADOL HCL 50 MG PO TABS: 50.0000 mg | ORAL_TABLET | Freq: Four times a day (QID) | ORAL | Status: AC | PRN
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
## 2012-03-11 ENCOUNTER — Encounter: Payer: Self-pay | Admitting: Family Medicine

## 2012-03-22 ENCOUNTER — Other Ambulatory Visit: Payer: Self-pay | Admitting: Physician Assistant

## 2012-03-25 ENCOUNTER — Other Ambulatory Visit: Payer: Self-pay | Admitting: Physician Assistant

## 2012-03-30 ENCOUNTER — Other Ambulatory Visit: Payer: Self-pay | Admitting: Physician Assistant

## 2012-05-10 ENCOUNTER — Ambulatory Visit (HOSPITAL_COMMUNITY)
Admission: RE | Admit: 2012-05-10 | Discharge: 2012-05-10 | Disposition: A | Discharge status: Home / Self Care | Payer: BC Managed Care – PPO | Source: Ambulatory Visit | Attending: Cardiovascular Disease | Admitting: Cardiovascular Disease

## 2012-05-10 ENCOUNTER — Encounter: Payer: Self-pay | Admitting: Cardiovascular Disease

## 2012-05-10 ENCOUNTER — Ambulatory Visit (INDEPENDENT_AMBULATORY_CARE_PROVIDER_SITE_OTHER): Payer: BC Managed Care – PPO | Admitting: Cardiovascular Disease

## 2012-05-10 VITALS — BP 120/77 | HR 64 | Ht 73.0 in | Wt 232.0 lb

## 2012-05-10 DIAGNOSIS — S99922A Unspecified injury of left foot, initial encounter: Secondary | ICD-10-CM

## 2012-05-10 DIAGNOSIS — S8990XA Unspecified injury of unspecified lower leg, initial encounter: Secondary | ICD-10-CM | POA: Insufficient documentation

## 2012-05-10 DIAGNOSIS — S99919A Unspecified injury of unspecified ankle, initial encounter: Secondary | ICD-10-CM

## 2012-05-10 DIAGNOSIS — S99929A Unspecified injury of unspecified foot, initial encounter: Secondary | ICD-10-CM | POA: Insufficient documentation

## 2012-05-10 DIAGNOSIS — W208XXA Other cause of strike by thrown, projected or falling object, initial encounter: Secondary | ICD-10-CM | POA: Insufficient documentation

## 2012-05-10 DIAGNOSIS — I2510 Atherosclerotic heart disease of native coronary artery without angina pectoris: Secondary | ICD-10-CM

## 2012-05-10 NOTE — Progress Notes (Signed)
HPI:  51 year old gentleman presenting for followup evaluation. This gentleman has coronary artery disease and underwent PCI of the LAD in 2012 after presenting with a non-ST elevation infarction. He had no other high-grade coronary disease noted. His left ventricular function was normal. When he was seen back in followup he complained of chest pain and a followup nuclear scan was done. This demonstrated no area of ischemia and he has continued with medical therapy. The patient has had no recurrent chest pain, shortness of breath, edema, palpitations, lightheadedness, or syncope. He reports compliance with his medications. He remains on dual antiplatelet therapy. He dropped a pallet on his left foot today and is complaining of pain in that foot. He has no other complaints at the present time.  Outpatient Encounter Prescriptions as of 05/10/2012  Medication Sig Dispense Refill  . acetaminophen (TYLENOL) 325 MG tablet Take 650 mg by mouth 2 (two) times daily as needed.        Marland Kitchen albuterol (PROVENTIL HFA;VENTOLIN HFA) 108 (90 BASE) MCG/ACT inhaler Inhale 2 puffs into the lungs every 4 (four) hours as needed for wheezing or shortness of breath.  1 Inhaler  11  . aspirin 81 MG tablet Take 81 mg by mouth daily.        Marland Kitchen atorvastatin (LIPITOR) 40 MG tablet TAKE 1 TABLET BY MOUTH DAILY AT BEDTIME  30 tablet  6  . beclomethasone (QVAR) 40 MCG/ACT inhaler Inhale 2 puffs into the lungs 2 (two) times daily.  1 Inhaler  12  . Cetirizine HCl (ZYRTEC ALLERGY) 10 MG CAPS 1 po qd  30 capsule    . losartan (COZAAR) 25 MG tablet TAKE 1 TABLET (25 MG TOTAL) BY MOUTH DAILY.  30 tablet  2  . metoprolol tartrate (LOPRESSOR) 25 MG tablet TAKE 1/2 TABLET BY MOUTH 2 TIMES DAILY  30 tablet  6  . mometasone (NASONEX) 50 MCG/ACT nasal spray Place 2 sprays into the nose daily.  17 g  2  . Multiple Vitamin (MULTIVITAMIN) tablet Take 1 tablet by mouth daily.        . nitroGLYCERIN (NITROSTAT) 0.4 MG SL tablet Place 0.4 mg under the  tongue every 5 (five) minutes as needed.        . pantoprazole (PROTONIX) 40 MG tablet       . ranitidine (ZANTAC) 150 MG tablet TAKE 1 TABLET TWICE A DAY. PLEASE FOLLOW UP  60 tablet  3  . DISCONTD: EFFIENT 10 MG TABS TAKE 1 TABLET BY MOUTH DAILY  30 tablet  6  . DISCONTD: ranitidine (ZANTAC) 150 MG capsule Take 150 mg by mouth 2 (two) times daily.        Marland Kitchen DISCONTD: topiramate (TOPAMAX) 100 MG tablet TAKE 1 TABLET (100 MG TOTAL) BY MOUTH AT BEDTIME.  30 tablet  2    No Known Allergies  Past Medical History  Diagnosis Date  . Migraines     Has been evaluated multiple times in past for chronic headache in which pt has had occasional nose bleeds and bloodshot eyes. This lasted for 4-5 months. CT of the head was negative in May 2012.  [name]     MRI in 2008 demonstrating 4.6 mm area of pituitary gland   . Duodenitis     May 2012 with heme positive stools at this time. Endorses only rare streaking of blood now.  . Colitis   . CAD (coronary artery disease)     NSTEMI 7/12:  Cardiac cath on 7/17: pLAD occluded,  Dx 30-40%, pRCA 30-40%, mRCA 30-40%.  Proximal LAD was treated with a BMS.  Echo 7/19:  EF 60-65%, mild LVH  . RBBB (right bundle branch block)     Noted on EKG in 2008  . Kidney stones   . Hyperlipidemia   . Hypertension   . Myocardial infarction 05/17/2011    ROS: Negative except as per HPI  BP 120/77  Pulse 64  Ht 6\' 1"  (1.854 m)  Wt 105.235 kg (232 lb)  BMI 30.61 kg/m2  PHYSICAL EXAM: Pt is alert and oriented, NAD HEENT: normal Neck: JVP - normal, carotids 2+= without bruits Lungs: CTA bilaterally CV: RRR without murmur or gallop Abd: soft, NT, Positive BS, no hepatomegaly Ext: no C/C/E, distal pulses intact and equal, there is tenderness and ecchymoses over the distal aspect on the dorsum of the left foot Skin: warm/dry no rash  EKG:  Normal sinus rhythm 61 beats per minute, right bundle branch block.  ASSESSMENT AND PLAN: 1. CAD status post PCI. The  patient remained stable without anginal symptoms. He should remain on antiplatelet therapy with aspirin 81 mg daily. He is out to 12 months from his PCI procedure he was treated with a bare-metal stent platform. I asked him to discontinue his effient at this point. He will remain on his other medications without changes.  2. Hypertension. The patient's blood pressure is well controlled on a combination of losartan and metoprolol.  3. Dyslipidemia. The patient is on Lipitor 40 mg daily. Lipids were last checked in October 2012 and he had a cholesterol of 114, triglycerides 50, HDL 51, and LDL 53.  4. Left foot contusion with ecchymoses. Will order a foot x-ray to make sure the patient does not have a fracture. If he has continued pain I asked him to followup with his primary care physician or with an orthopedist.  Tonny Bollman 05/10/2012 6:28 PM

## 2012-05-10 NOTE — Patient Instructions (Addendum)
Please have x-ray of foot.  Your physician has recommended you make the following change in your medication: STOP Effient  Your physician wants you to follow-up in: 1 YEAR with Dr Excell Seltzer.  You will receive a reminder letter in the mail two months in advance. If you don't receive a letter, please call our office to schedule the follow-up appointment.

## 2012-05-15 ENCOUNTER — Telehealth: Payer: Self-pay

## 2012-05-15 ENCOUNTER — Ambulatory Visit (INDEPENDENT_AMBULATORY_CARE_PROVIDER_SITE_OTHER): Payer: BC Managed Care – PPO | Admitting: Family Medicine

## 2012-05-15 ENCOUNTER — Encounter: Payer: Self-pay | Admitting: Family Medicine

## 2012-05-15 VITALS — BP 120/72 | HR 60 | Temp 97.6°F | Ht 73.0 in | Wt 232.0 lb

## 2012-05-15 DIAGNOSIS — IMO0002 Reserved for concepts with insufficient information to code with codable children: Secondary | ICD-10-CM

## 2012-05-15 DIAGNOSIS — S99929A Unspecified injury of unspecified foot, initial encounter: Secondary | ICD-10-CM

## 2012-05-15 DIAGNOSIS — T148XXA Other injury of unspecified body region, initial encounter: Secondary | ICD-10-CM

## 2012-05-15 DIAGNOSIS — S99919A Unspecified injury of unspecified ankle, initial encounter: Secondary | ICD-10-CM

## 2012-05-15 DIAGNOSIS — Z23 Encounter for immunization: Secondary | ICD-10-CM

## 2012-05-15 MED ORDER — NONFORMULARY OR COMPOUNDED ITEM: Status: DC

## 2012-05-15 NOTE — Telephone Encounter (Signed)
Patient walked in and reported his left toes being green and unable to move the toes, he stated he had something fall on his foot x's 5 days ago, he has had a x-ray but the foot is getting worst and he was told to see his PCP. He is concerned about the green. I worked the patient in today with Dr.Lowne.    KP
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
## 2012-05-15 NOTE — Assessment & Plan Note (Signed)
Post op shoe Ace Repeat xray end of week if no better Consider ortho

## 2012-05-15 NOTE — Patient Instructions (Signed)
Contusion (Bruise) of Foot Injury to the foot causes bruises (contusions). Contusions are caused by bleeding from small blood vessels that allow blood to leak out into the muscles, cord-like structures that attach muscle to bone (tendons), and/or other soft tissue.  CAUSES  Contusions of the foot are common. Bruises are frequently seen from:  Contact sports injuries.   The use of medications that thin the blood (anti-coagulants).   Aspirin and non-steroidal anti-inflammatory agents that decrease the clotting ability.   People with vitamin deficiencies.  SYMPTOMS  Signs of foot injury include pain and swelling. At first there may be discoloration from blood under the skin. This will appear blue to purple in color. As the bruise ages, the color turns yellow. Swelling may limit the movement of the toes.  Complications from foot injury may include:  Collections of blood leading to disability if calcium deposits form. These can later limit movement in the foot.   Infection of the foot if there are breaks in the skin.   Rupture of the tendons that may need surgical repair.  DIAGNOSIS  Diagnosing foot injuries can be made by observation. If problems continue, X-rays may be needed to make sure there are no broken bones (fractures). Continuing problems may require physical therapy.  HOME CARE INSTRUCTIONS   Apply ice to the injury for 15 to 20 minutes, 3 to 4 times per day. Put the ice in a plastic bag and place a towel between the bag of ice and your skin.   An elastic wrap (like an Ace bandage) may be used to keep swelling down.   Keep foot elevated to reduce swelling and discomfort.   Try to avoid standing or walking while the foot is painful. Do not resume use until instructed by your caregiver. Then begin use gradually. If pain develops, decrease use and continue the above measures. Gradually increase activities that do not cause discomfort until you slowly have normal use.   Only take  over-the-counter or prescription medicines for pain, discomfort, or fever as directed by your caregiver. Use only if your caregiver has not given medications that would interfere.   Begin daily rehabilitation exercises when supportive wrapping is no longer needed.   Use ice massage for 10 minutes before and after workouts. Fill a large styrofoam cup with water and freeze. Tear a small amount of foam from the top so ice protrudes. Massage ice firmly over the injured area in a circle about the size of a softball.   Always eat a well balanced diet.   Follow all instructions for follow up with your caregiver, any orthopedic referrals, physical therapy and rehabilitation. Any delay in obtaining necessary care could result in delayed healing, and temporary or permanent disability.  SEEK IMMEDIATE MEDICAL CARE IF:   Your pain and swelling increase, or pain is uncontrolled with medications.   You have loss of feeling in your foot, or your foot turns cold or blue.   An oral temperature above 102 F (38.9 C) develops, not controlled by medication.   Your foot becomes warm to touch, or you have more pain with movement of your toes.   You have a foot contusion that does not improve in 1 or 2 days.   Skin is broken and signs of infection occur (drainage, increasing pain, fever, headache, muscle aches, dizziness or a general ill feeling).   You develop new, unexplained symptoms, or an increase of the symptoms that brought you to your caregiver.  MAKE SURE YOU:     Understand these instructions.   Will watch your condition.   Will get help right away if you are not doing well or get worse.  Document Released: 08/09/2006 Document Revised: 10/07/2011 Document Reviewed: 09/21/2011 [employer] Patient Information 2012 [employer]. 

## 2012-05-18 ENCOUNTER — Encounter: Payer: Self-pay | Admitting: Family Medicine

## 2012-05-18 ENCOUNTER — Ambulatory Visit (INDEPENDENT_AMBULATORY_CARE_PROVIDER_SITE_OTHER): Payer: BC Managed Care – PPO | Admitting: Family Medicine

## 2012-05-18 VITALS — BP 114/70 | HR 72 | Temp 97.9°F | Wt 230.6 lb

## 2012-05-18 DIAGNOSIS — M79609 Pain in unspecified limb: Secondary | ICD-10-CM

## 2012-05-18 DIAGNOSIS — M79672 Pain in left foot: Secondary | ICD-10-CM

## 2012-05-18 NOTE — Progress Notes (Signed)
  Subjective:    Michael Pearson is a 51 y.o. male who presents with left foot pain. Onset of the symptoms was several days ago. Precipitating event: board fell on foot-- see last ov. Current symptoms include: bruising, inability to bear weight and swelling. Aggravating factors: any weight bearing. Symptoms have not improved at all. Patient has had no prior foot problems. Evaluation to date: plain films: normal. Treatment to date: avoidance of offending activity, brace which is not very effective, ice and OTC analgesics which are not very effective.  The following portions of the patient's history were reviewed and updated as appropriate: allergies, current medications, past family history, past medical history, past social history, past surgical history and problem list.  Review of Systems Pertinent items are noted in HPI.    Objective:    BP 114/70  Pulse 72  Temp 97.9 F (36.6 C) (Oral)  Wt 230 lb 9.6 oz (104.599 kg)  SpO2 95% Right foot:  normal exam, no swelling, tenderness, instability; ligaments intact, full range of motion of all ankle/foot joints  Left foot:  soft tissue swelling noted over the top of foot and ankle, tenderness of the 1st, 2nd, 3rd, 4th and 5th metatarsal head and ecchymoses and swelling of the 1st, 2nd, 3rd, 4th and 5th toe   Imaging: X-ray of the left foot: no fracture, dislocation, swelling or degenerative changes noted    Assessment:    Non-specific foot pain    Plan:    Natural history and expected course discussed. Questions answered. Agricultural engineer distributed. Rest, ice, compression, and elevation (RICE) therapy. OTC analgesics as needed. Orthopedics referral.

## 2012-05-18 NOTE — Patient Instructions (Addendum)
Contusion (Bruise) of Foot Injury to the foot causes bruises (contusions). Contusions are caused by bleeding from small blood vessels that allow blood to leak out into the muscles, cord-like structures that attach muscle to bone (tendons), and/or other soft tissue.  CAUSES  Contusions of the foot are common. Bruises are frequently seen from:  Contact sports injuries.   The use of medications that thin the blood (anti-coagulants).   Aspirin and non-steroidal anti-inflammatory agents that decrease the clotting ability.   People with vitamin deficiencies.  SYMPTOMS  Signs of foot injury include pain and swelling. At first there may be discoloration from blood under the skin. This will appear blue to purple in color. As the bruise ages, the color turns yellow. Swelling may limit the movement of the toes.  Complications from foot injury may include:  Collections of blood leading to disability if calcium deposits form. These can later limit movement in the foot.   Infection of the foot if there are breaks in the skin.   Rupture of the tendons that may need surgical repair.  DIAGNOSIS  Diagnosing foot injuries can be made by observation. If problems continue, X-rays may be needed to make sure there are no broken bones (fractures). Continuing problems may require physical therapy.  HOME CARE INSTRUCTIONS   Apply ice to the injury for 15 to 20 minutes, 3 to 4 times per day. Put the ice in a plastic bag and place a towel between the bag of ice and your skin.   An elastic wrap (like an Ace bandage) may be used to keep swelling down.   Keep foot elevated to reduce swelling and discomfort.   Try to avoid standing or walking while the foot is painful. Do not resume use until instructed by your caregiver. Then begin use gradually. If pain develops, decrease use and continue the above measures. Gradually increase activities that do not cause discomfort until you slowly have normal use.   Only take  over-the-counter or prescription medicines for pain, discomfort, or fever as directed by your caregiver. Use only if your caregiver has not given medications that would interfere.   Begin daily rehabilitation exercises when supportive wrapping is no longer needed.   Use ice massage for 10 minutes before and after workouts. Fill a large styrofoam cup with water and freeze. Tear a small amount of foam from the top so ice protrudes. Massage ice firmly over the injured area in a circle about the size of a softball.   Always eat a well balanced diet.   Follow all instructions for follow up with your caregiver, any orthopedic referrals, physical therapy and rehabilitation. Any delay in obtaining necessary care could result in delayed healing, and temporary or permanent disability.  SEEK IMMEDIATE MEDICAL CARE IF:   Your pain and swelling increase, or pain is uncontrolled with medications.   You have loss of feeling in your foot, or your foot turns cold or blue.   An oral temperature above 102 F (38.9 C) develops, not controlled by medication.   Your foot becomes warm to touch, or you have more pain with movement of your toes.   You have a foot contusion that does not improve in 1 or 2 days.   Skin is broken and signs of infection occur (drainage, increasing pain, fever, headache, muscle aches, dizziness or a general ill feeling).   You develop new, unexplained symptoms, or an increase of the symptoms that brought you to your caregiver.  MAKE SURE YOU:     Understand these instructions.   Will watch your condition.   Will get help right away if you are not doing well or get worse.  Document Released: 08/09/2006 Document Revised: 10/07/2011 Document Reviewed: 09/21/2011 [employer] Patient Information 2012 [employer]. 

## 2012-05-29 ENCOUNTER — Other Ambulatory Visit: Payer: Self-pay | Admitting: Cardiovascular Disease

## 2012-07-20 ENCOUNTER — Other Ambulatory Visit: Payer: Self-pay | Admitting: Cardiovascular Disease

## 2012-08-23 ENCOUNTER — Other Ambulatory Visit: Payer: Self-pay | Admitting: Cardiovascular Disease

## 2012-09-01 ENCOUNTER — Ambulatory Visit (INDEPENDENT_AMBULATORY_CARE_PROVIDER_SITE_OTHER): Payer: BC Managed Care – PPO | Admitting: Emergency Medicine

## 2012-09-01 ENCOUNTER — Encounter: Payer: Self-pay | Admitting: Emergency Medicine

## 2012-09-01 VITALS — BP 120/70 | HR 60 | Temp 98.4°F | Ht 75.0 in | Wt 240.4 lb

## 2012-09-01 DIAGNOSIS — R059 Cough, unspecified: Secondary | ICD-10-CM

## 2012-09-01 DIAGNOSIS — J4520 Mild intermittent asthma, uncomplicated: Secondary | ICD-10-CM

## 2012-09-01 DIAGNOSIS — J45909 Unspecified asthma, uncomplicated: Secondary | ICD-10-CM

## 2012-09-01 DIAGNOSIS — R05 Cough: Secondary | ICD-10-CM

## 2012-09-01 NOTE — Assessment & Plan Note (Signed)
continue qvar + SABA prn

## 2012-09-01 NOTE — Progress Notes (Signed)
  Subjective:    Patient ID: Michael Pearson, male    DOB: Aug 27, 1961, 51 y.o.   MRN: [id number]  HPI 51 yo never smoker, hx HTN, CAD s/p MI and PTCI 7/12, allergies. Referred for DOE by Dr Laury Axon.  He has been dealing with exertional SOB for years, associated with migraines HA and sinus congestion, fullness. He relates this to an exposure to battery acid about 9 yrs ago; he poured city water (chlorinated) onto battery acid, likely had a HCl inhalational injury. He has SOB with walking, worse w heat. For the last few weeks has had a cough, initially productive mucous, but then dry. He started lisinopril after the MI and cough worsened.   ROV 08/31/11 -- f/u for dyspnea. He has CAD s/p PTCI. His breathing improved some after the PTCI, but still bothersome so we did PFT. 10/3 Dr Excell Seltzer changed ACE-I to to ARB - cough some better. He is taking QVAR bid. PFT today show possible mild AFL based on BD response. He doesn't have a rescue inhaler. He is on nasonex and zyrtec for PND and allergies.    PULMONARY FUNCTON TEST 08/31/2011  FVC 5.3  FEV1 4.32  FEV1/FVC 81.5  FVC  % Predicted 97  FEV % Predicted 109  FeF 25-75 4.28  FeF 25-75 % Predicted 3.74    ROV 09/01/12 -- follows up for mild intermittent asthma, cough, allergies. We continued QVAR, zyrtec, nasonex.  Has done well for the last year, no real exacerbations. He is having a lot of allergy sx and HA. Having clear nasal gtt. He rarely uses SABA, but he did use it yesterday.     Objective:   Physical Exam Filed Vitals:   09/01/12 0920  BP: 120/70  Pulse: 60  Temp: 98.4 F (36.9 C)    Gen: Pleasant, well-nourished, in no distress,  normal affect  ENT: No lesions,  mouth clear,  oropharynx clear, no postnasal drip  Neck: No JVD, no TMG, no carotid bruits  Lungs: No use of accessory muscles, no dullness to percussion, clear without rales or rhonchi  Cardiovascular: RRR, heart sounds normal, no murmur or gallops, no peripheral  edema  Musculoskeletal: No deformities, no cyanosis or clubbing  Neuro: alert, non focal  Skin: Warm, no lesions or rashes     Assessment & Plan:  Mild intermittent asthma continue qvar + SABA prn  Cough Driven by allergies, now off ACE-I - continue nasonex, zyrtec - add chlorpheniramine or brompheniramine

## 2012-09-01 NOTE — Patient Instructions (Addendum)
Please continue your QVAR 2 puff twice a day Use albuterol 2 puffs as needed for shortness of breath Continue your zyrtec and nasonex as you are using them Try using decongestants that contain either chlorpheniramine or brompheniramine Follow with Dr Delton Coombes in 12 months or sooner if you have any problems

## 2012-09-01 NOTE — Assessment & Plan Note (Signed)
Driven by allergies, now off ACE-I - continue nasonex, zyrtec - add chlorpheniramine or brompheniramine

## 2012-11-03 ENCOUNTER — Ambulatory Visit (INDEPENDENT_AMBULATORY_CARE_PROVIDER_SITE_OTHER): Payer: Self-pay | Admitting: Family Medicine

## 2012-11-03 ENCOUNTER — Encounter: Payer: Self-pay | Admitting: Family Medicine

## 2012-11-03 VITALS — BP 120/78 | HR 69 | Temp 98.6°F | Ht 74.0 in | Wt 240.2 lb

## 2012-11-03 DIAGNOSIS — E785 Hyperlipidemia, unspecified: Secondary | ICD-10-CM

## 2012-11-03 DIAGNOSIS — Z0000 Encounter for general adult medical examination without abnormal findings: Secondary | ICD-10-CM

## 2012-11-03 DIAGNOSIS — I2510 Atherosclerotic heart disease of native coronary artery without angina pectoris: Secondary | ICD-10-CM

## 2012-11-03 DIAGNOSIS — I10 Essential (primary) hypertension: Secondary | ICD-10-CM

## 2012-11-03 DIAGNOSIS — I214 Non-ST elevation (NSTEMI) myocardial infarction: Secondary | ICD-10-CM

## 2012-11-03 DIAGNOSIS — J309 Allergic rhinitis, unspecified: Secondary | ICD-10-CM | POA: Insufficient documentation

## 2012-11-03 DIAGNOSIS — R195 Other fecal abnormalities: Secondary | ICD-10-CM

## 2012-11-03 DIAGNOSIS — J4520 Mild intermittent asthma, uncomplicated: Secondary | ICD-10-CM

## 2012-11-03 DIAGNOSIS — Z9109 Other allergy status, other than to drugs and biological substances: Secondary | ICD-10-CM

## 2012-11-03 DIAGNOSIS — J45909 Unspecified asthma, uncomplicated: Secondary | ICD-10-CM

## 2012-11-03 LAB — CBC WITH DIFFERENTIAL/PLATELET
Basophils Absolute: 0.1 10*3/uL (ref 0.0–0.1)
Eosinophils Absolute: 0.4 10*3/uL (ref 0.0–0.7)
Eosinophils Relative: 7.3 % — ABNORMAL HIGH (ref 0.0–5.0)
MCHC: 33.8 g/dL (ref 30.0–36.0)
MCV: 95.3 fl (ref 78.0–100.0)
Monocytes Absolute: 0.4 10*3/uL (ref 0.1–1.0)
Neutrophils Relative %: 52.2 % (ref 43.0–77.0)
Platelets: 203 10*3/uL (ref 150.0–400.0)
RDW: 13.6 % (ref 11.5–14.6)
WBC: 5.4 10*3/uL (ref 4.5–10.5)

## 2012-11-03 LAB — LIPID PANEL
HDL: 46.2 mg/dL (ref >39.00)
LDL Cholesterol: 54 mg/dL (ref 0–99)
Total CHOL/HDL Ratio: 2
Triglycerides: 53 mg/dL (ref 0.0–149.0)
VLDL: 10.6 mg/dL (ref 0.0–40.0)

## 2012-11-03 LAB — POCT URINALYSIS DIPSTICK
Blood, UA: NEGATIVE
Nitrite, UA: NEGATIVE
Spec Grav, UA: <=1.005
Urobilinogen, UA: 0.2
pH, UA: 5

## 2012-11-03 LAB — HEPATIC FUNCTION PANEL
Bilirubin, Direct: 0.2 mg/dL (ref 0.0–0.3)
Total Bilirubin: 1.1 mg/dL (ref 0.3–1.2)

## 2012-11-03 LAB — BASIC METABOLIC PANEL
BUN: 13 mg/dL (ref 6–23)
Chloride: 103 mEq/L (ref 96–112)
Creatinine, Ser: 0.8 mg/dL (ref 0.4–1.5)

## 2012-11-03 MED ORDER — ATORVASTATIN CALCIUM 40 MG PO TABS: 40.0000 mg | ORAL_TABLET | Freq: Every day | ORAL | Status: DC

## 2012-11-03 MED ORDER — PREDNISONE 10 MG PO TABS: ORAL_TABLET | ORAL | Status: DC

## 2012-11-03 MED ORDER — LOSARTAN POTASSIUM 25 MG PO TABS: 25.0000 mg | ORAL_TABLET | Freq: Every day | ORAL | Status: DC

## 2012-11-03 MED ORDER — RANITIDINE HCL 150 MG PO TABS: 150.0000 mg | ORAL_TABLET | Freq: Two times a day (BID) | ORAL | Status: DC

## 2012-11-03 NOTE — Assessment & Plan Note (Signed)
Per pul

## 2012-11-03 NOTE — Assessment & Plan Note (Signed)
No chest pain F/u cardiology

## 2012-11-03 NOTE — Assessment & Plan Note (Signed)
con't nasonex con't zyrtec Pt states he is also taking other med pulm recommended pred taper-

## 2012-11-03 NOTE — Assessment & Plan Note (Signed)
Con' meds---pt lost Ins rx sent to [facility] and pt will discuss sliding scale with business office

## 2012-11-03 NOTE — Assessment & Plan Note (Signed)
Check labs con't meds 

## 2012-11-03 NOTE — Progress Notes (Signed)
Subjective:    Patient ID: Michael Pearson, male    DOB: 1961-04-15, 52 y.o.   MRN: [id number]  HPI Pt here for cpe and labs.     Review of Systems Review of Systems  Constitutional: Negative for activity change, appetite change and fatigue.  HENT: Negative for hearing loss, congestion, tinnitus and ear discharge.  dentist--12 m ago Eyes: Negative for visual disturbance (see optho q1y -- vision corrected to 20/20 with glasses).  Respiratory: Negative for cough, chest tightness and shortness of breath.   Cardiovascular: Negative for chest pain, palpitations and leg swelling.  Gastrointestinal: Negative for abdominal pain, diarrhea, constipation and abdominal distention.  Genitourinary: Negative for urgency, frequency, decreased urine volume and difficulty urinating.  Musculoskeletal: Negative for back pain, arthralgias and gait problem.  Skin: Negative for color change, pallor and rash.  Neurological: Negative for dizziness, light-headedness, numbness and headaches.  Hematological: Negative for adenopathy. Does not bruise/bleed easily.  Psychiatric/Behavioral: Negative for suicidal ideas, confusion, sleep disturbance, self-injury, dysphoric mood, decreased concentration and agitation.     Past Medical History  Diagnosis Date  . Migraines     Has been evaluated multiple times in past for chronic headache in which pt has had occasional nose bleeds and bloodshot eyes. This lasted for 4-5 months. CT of the head was negative in May 2012.  [name]     MRI in 2008 demonstrating 4.6 mm area of pituitary gland   . Duodenitis     May 2012 with heme positive stools at this time. Endorses only rare streaking of blood now.  . Colitis   . CAD (coronary artery disease)     NSTEMI 7/12:  Cardiac cath on 7/17: pLAD occluded, Dx 30-40%, pRCA 30-40%, mRCA 30-40%.  Proximal LAD was treated with a BMS.  Echo 7/19:  EF 60-65%, mild LVH  . RBBB (right bundle branch block)     Noted on EKG in 2008  .  Kidney stones   . Hyperlipidemia   . Hypertension   . Myocardial infarction 05/17/2011  . Allergy   . Asthma    Family History  Problem Relation Age of Onset  . Stroke Mother   . Heart attack Father 36    MI  . Heart disease Father 31    MI  . Obesity Brother   . Coronary artery disease Brother     Possible, pt not sure of specifics  . Hypertension Brother   . Skin cancer     Current Outpatient Prescriptions on File Prior to Visit  Medication Sig Dispense Refill  . acetaminophen (TYLENOL) 325 MG tablet Take 650 mg by mouth 2 (two) times daily as needed.        Marland Kitchen albuterol (PROVENTIL HFA;VENTOLIN HFA) 108 (90 BASE) MCG/ACT inhaler Inhale 2 puffs into the lungs every 4 (four) hours as needed for wheezing or shortness of breath.  1 Inhaler  11  . aspirin 81 MG tablet Take 81 mg by mouth daily.        Marland Kitchen atorvastatin (LIPITOR) 40 MG tablet Take 1 tablet (40 mg total) by mouth daily.  30 tablet  9  . beclomethasone (QVAR) 40 MCG/ACT inhaler Inhale 2 puffs into the lungs 2 (two) times daily.  1 Inhaler  12  . Cetirizine HCl (ZYRTEC ALLERGY) 10 MG CAPS 1 po qd  30 capsule    . losartan (COZAAR) 25 MG tablet Take 1 tablet (25 mg total) by mouth daily.  90 tablet  3  . metoprolol  tartrate (LOPRESSOR) 25 MG tablet TAKE 1/2 TABLET BY MOUTH 2 TIMES DAILY  30 tablet  9  . mometasone (NASONEX) 50 MCG/ACT nasal spray Place 2 sprays into the nose daily.  17 g  2  . Multiple Vitamin (MULTIVITAMIN) tablet Take 1 tablet by mouth daily.        . nitroGLYCERIN (NITROSTAT) 0.4 MG SL tablet Place 0.4 mg under the tongue every 5 (five) minutes as needed.         History   Social History  . Marital Status: Single    Spouse Name: N/A    Number of Children: N/A  . Years of Education: N/A   Occupational History  . [employer]-- cleans 3 buildings     Fairly physical job  .     Social History Main Topics  . Smoking status: Never Smoker   . Smokeless tobacco: Never Used  . Alcohol Use: No  .  Drug Use: No  . Sexually Active: Not Currently   Other Topics Concern  . Not on file   Social History Narrative   Lives with brother and motherMother is quite sick, he and his brother take turns taking care of herNo children       Objective:   Physical Exam BP 120/78  Pulse 69  Temp 98.6 F (37 C) (Oral)  Ht 6\' 2"  (1.88 m)  Wt 240 lb 3.2 oz (108.954 kg)  BMI 30.84 kg/m2  SpO2 95% General appearance: alert, cooperative, appears stated age and no distress Head: Normocephalic, without obvious abnormality, atraumatic Eyes: conjunctivae/corneas clear. PERRL, EOM's intact. Fundi benign. Ears: normal TM's and external ear canals both ears Nose: clear discharge, moderate congestion, turbinates red, swollen, sinus tenderness bilateral Throat: lips, mucosa, and tongue normal; teeth and gums normal Neck: no adenopathy, no carotid bruit, no JVD, supple, symmetrical, trachea midline and thyroid not enlarged, symmetric, no tenderness/mass/nodules Back: symmetric, no curvature. ROM normal. No CVA tenderness. Lungs: clear to auscultation bilaterally Chest wall: no tenderness Heart: regular rate and rhythm, S1, S2 normal, no murmur, click, rub or gallop Abdomen: soft, non-tender; bowel sounds normal; no masses,  no organomegaly Male genitalia: normal Rectal: normal tone, normal prostate, no masses or tenderness--heme + brown stool Extremities: extremities normal, atraumatic, no cyanosis or edema Pulses: 2+ and symmetric Skin: Skin color, texture, turgor normal. No rashes or lesions Lymph nodes: Cervical, supraclavicular, and axillary nodes normal. Neurologic: Alert and oriented X 3, normal strength and tone. Normal symmetric reflexes. Normal coordination and gait Psych--no depression, no anxiety        Assessment & Plan:  Cpe,   ghm utd            Check fasting labs            See avs

## 2012-11-03 NOTE — Assessment & Plan Note (Signed)
Pt with hx heme + stools per patient Had flex sig with Dr Renaldo Reel in 2012----  Hemorrhoids, colitis Refer to GI

## 2012-11-03 NOTE — Patient Instructions (Addendum)
Preventive Care for Adults, Male A healthy lifestyle and preventive care can promote health and wellness. Preventive health guidelines for men include the following key practices:  A routine yearly physical is a good way to check with your caregiver about your health and preventative screening. It is a chance to share any concerns and updates on your health, and to receive a thorough exam.  Visit your dentist for a routine exam and preventative care every 6 months. Brush your teeth twice a day and floss once a day. Good oral hygiene prevents tooth decay and gum disease.  The frequency of eye exams is based on your age, health, family medical history, use of contact lenses, and other factors. Follow your caregiver's recommendations for frequency of eye exams.  Eat a healthy diet. Foods like vegetables, fruits, whole grains, low-fat dairy products, and lean protein foods contain the nutrients you need without too many calories. Decrease your intake of foods high in solid fats, added sugars, and salt. Eat the right amount of calories for you.Get information about a proper diet from your caregiver, if necessary.  Regular physical exercise is one of the most important things you can do for your health. Most adults should get at least 150 minutes of moderate-intensity exercise (any activity that increases your heart rate and causes you to sweat) each week. In addition, most adults need muscle-strengthening exercises on 2 or more days a week.  Maintain a healthy weight. The body mass index (BMI) is a screening tool to identify possible weight problems. It provides an estimate of body fat based on height and weight. Your caregiver can help determine your BMI, and can help you achieve or maintain a healthy weight.For adults 20 years and older:  A BMI below 18.5 is considered underweight.  A BMI of 18.5 to 24.9 is normal.  A BMI of 25 to 29.9 is considered overweight.  A BMI of 30 and above is  considered obese.  Maintain normal blood lipids and cholesterol levels by exercising and minimizing your intake of saturated fat. Eat a balanced diet with plenty of fruit and vegetables. Blood tests for lipids and cholesterol should begin at age 20 and be repeated every 5 years. If your lipid or cholesterol levels are high, you are over 50, or you are a high risk for heart disease, you may need your cholesterol levels checked more frequently.Ongoing high lipid and cholesterol levels should be treated with medicines if diet and exercise are not effective.  If you smoke, find out from your caregiver how to quit. If you do not use tobacco, do not start.  If you choose to drink alcohol, do not exceed 2 drinks per day. One drink is considered to be 12 ounces (355 mL) of beer, 5 ounces (148 mL) of wine, or 1.5 ounces (44 mL) of liquor.  Avoid use of street drugs. Do not share needles with anyone. Ask for help if you need support or instructions about stopping the use of drugs.  High blood pressure causes heart disease and increases the risk of stroke. Your blood pressure should be checked at least every 1 to 2 years. Ongoing high blood pressure should be treated with medicines, if weight loss and exercise are not effective.  If you are 45 to 52 years old, ask your caregiver if you should take aspirin to prevent heart disease.  Diabetes screening involves taking a blood sample to check your fasting blood sugar level. This should be done once every 3 years,   after age 45, if you are within normal weight and without risk factors for diabetes. Testing should be considered at a younger age or be carried out more frequently if you are overweight and have at least 1 risk factor for diabetes.  Colorectal cancer can be detected and often prevented. Most routine colorectal cancer screening begins at the age of 50 and continues through age 75. However, your caregiver may recommend screening at an earlier age if you  have risk factors for colon cancer. On a yearly basis, your caregiver may provide home test kits to check for hidden blood in the stool. Use of a small camera at the end of a tube, to directly examine the colon (sigmoidoscopy or colonoscopy), can detect the earliest forms of colorectal cancer. Talk to your caregiver about this at age 50, when routine screening begins. Direct examination of the colon should be repeated every 5 to 10 years through age 75, unless early forms of pre-cancerous polyps or small growths are found.  Hepatitis C blood testing is recommended for all people born from 1945 through 1965 and any individual with known risks for hepatitis C.  Practice safe sex. Use condoms and avoid high-risk sexual practices to reduce the spread of sexually transmitted infections (STIs). STIs include gonorrhea, chlamydia, syphilis, trichomonas, herpes, HPV, and human immunodeficiency virus (HIV). Herpes, HIV, and HPV are viral illnesses that have no cure. They can result in disability, cancer, and death.  A one-time screening for abdominal aortic aneurysm (AAA) and surgical repair of large AAAs by sound wave imaging (ultrasonography) is recommended for ages 65 to 75 years who are current or former smokers.  Healthy men should no longer receive prostate-specific antigen (PSA) blood tests as part of routine cancer screening. Consult with your caregiver about prostate cancer screening.  Testicular cancer screening is not recommended for adult males who have no symptoms. Screening includes self-exam, caregiver exam, and other screening tests. Consult with your caregiver about any symptoms you have or any concerns you have about testicular cancer.  Use sunscreen with skin protection factor (SPF) of 30 or more. Apply sunscreen liberally and repeatedly throughout the day. You should seek shade when your shadow is shorter than you. Protect yourself by wearing long sleeves, pants, a wide-brimmed hat, and  sunglasses year round, whenever you are outdoors.  Once a month, do a whole body skin exam, using a mirror to look at the skin on your back. Notify your caregiver of new moles, moles that have irregular borders, moles that are larger than a pencil eraser, or moles that have changed in shape or color.  Stay current with required immunizations.  Influenza. You need a dose every fall (or winter). The composition of the flu vaccine changes each year, so being vaccinated once is not enough.  Pneumococcal polysaccharide. You need 1 to 2 doses if you smoke cigarettes or if you have certain chronic medical conditions. You need 1 dose at age 65 (or older) if you have never been vaccinated.  Tetanus, diphtheria, pertussis (Tdap, Td). Get 1 dose of Tdap vaccine if you are younger than age 65 years, are over 65 and have contact with an infant, are a healthcare worker, or simply want to be protected from whooping cough. After that, you need a Td booster dose every 10 years. Consult your caregiver if you have not had at least 3 tetanus and diphtheria-containing shots sometime in your life or have a deep or dirty wound.  HPV. This vaccine is recommended   for males 13 through 52 years of age. This vaccine may be given to men 22 through 52 years of age who have not completed the 3 dose series. It is recommended for men through age 26 who have sex with men or whose immune system is weakened because of HIV infection, other illness, or medications. The vaccine is given in 3 doses over 6 months.  Measles, mumps, rubella (MMR). You need at least 1 dose of MMR if you were born in 1957 or later. You may also need a 2nd dose.  Meningococcal. If you are age 19 to 21 years and a first-year college student living in a residence hall, or have one of several medical conditions, you need to get vaccinated against meningococcal disease. You may also need additional booster doses.  Zoster (shingles). If you are age 60 years or  older, you should get this vaccine.  Varicella (chickenpox). If you have never had chickenpox or you were vaccinated but received only 1 dose, talk to your caregiver to find out if you need this vaccine.  Hepatitis A. You need this vaccine if you have a specific risk factor for hepatitis A virus infection, or you simply wish to be protected from this disease. The vaccine is usually given as 2 doses, 6 to 18 months apart.  Hepatitis B. You need this vaccine if you have a specific risk factor for hepatitis B virus infection or you simply wish to be protected from this disease. The vaccine is given in 3 doses, usually over 6 months. Preventative Service / Frequency Ages 19 to 39  Blood pressure check.** / Every 1 to 2 years.  Lipid and cholesterol check.** / Every 5 years beginning at age 20.  Hepatitis C blood test.** / For any individual with known risks for hepatitis C.  Skin self-exam. / Monthly.  Influenza immunization.** / Every year.  Pneumococcal polysaccharide immunization.** / 1 to 2 doses if you smoke cigarettes or if you have certain chronic medical conditions.  Tetanus, diphtheria, pertussis (Tdap,Td) immunization. / A one-time dose of Tdap vaccine. After that, you need a Td booster dose every 10 years.  HPV immunization. / 3 doses over 6 months, if 26 and younger.  Measles, mumps, rubella (MMR) immunization. / You need at least 1 dose of MMR if you were born in 1957 or later. You may also need a 2nd dose.  Meningococcal immunization. / 1 dose if you are age 19 to 21 years and a first-year college student living in a residence hall, or have one of several medical conditions, you need to get vaccinated against meningococcal disease. You may also need additional booster doses.  Varicella immunization.** / Consult your caregiver.  Hepatitis A immunization.** / Consult your caregiver. 2 doses, 6 to 18 months apart.  Hepatitis B immunization.** / Consult your caregiver. 3 doses  usually over 6 months. Ages 40 to 64  Blood pressure check.** / Every 1 to 2 years.  Lipid and cholesterol check.** / Every 5 years beginning at age 20.  Fecal occult blood test (FOBT) of stool. / Every year beginning at age 50 and continuing until age 75. You may not have to do this test if you get colonoscopy every 10 years.  Flexible sigmoidoscopy** or colonoscopy.** / Every 5 years for a flexible sigmoidoscopy or every 10 years for a colonoscopy beginning at age 50 and continuing until age 75.  Hepatitis C blood test.** / For all people born from 1945 through 1965 and any   individual with known risks for hepatitis C.  Skin self-exam. / Monthly.  Influenza immunization.** / Every year.  Pneumococcal polysaccharide immunization.** / 1 to 2 doses if you smoke cigarettes or if you have certain chronic medical conditions.  Tetanus, diphtheria, pertussis (Tdap/Td) immunization.** / A one-time dose of Tdap vaccine. After that, you need a Td booster dose every 10 years.  Measles, mumps, rubella (MMR) immunization. / You need at least 1 dose of MMR if you were born in 1957 or later. You may also need a 2nd dose.  Varicella immunization.**/ Consult your caregiver.  Meningococcal immunization.** / Consult your caregiver.  Hepatitis A immunization.** / Consult your caregiver. 2 doses, 6 to 18 months apart.  Hepatitis B immunization.** / Consult your caregiver. 3 doses, usually over 6 months. Ages 65 and over  Blood pressure check.** / Every 1 to 2 years.  Lipid and cholesterol check.**/ Every 5 years beginning at age 20.  Fecal occult blood test (FOBT) of stool. / Every year beginning at age 50 and continuing until age 75. You may not have to do this test if you get colonoscopy every 10 years.  Flexible sigmoidoscopy** or colonoscopy.** / Every 5 years for a flexible sigmoidoscopy or every 10 years for a colonoscopy beginning at age 50 and continuing until age 75.  Hepatitis C blood  test.** / For all people born from 1945 through 1965 and any individual with known risks for hepatitis C.  Abdominal aortic aneurysm (AAA) screening.** / A one-time screening for ages 65 to 75 years who are current or former smokers.  Skin self-exam. / Monthly.  Influenza immunization.** / Every year.  Pneumococcal polysaccharide immunization.** / 1 dose at age 65 (or older) if you have never been vaccinated.  Tetanus, diphtheria, pertussis (Tdap, Td) immunization. / A one-time dose of Tdap vaccine if you are over 65 and have contact with an infant, are a healthcare worker, or simply want to be protected from whooping cough. After that, you need a Td booster dose every 10 years.  Varicella immunization. ** / Consult your caregiver.  Meningococcal immunization.** / Consult your caregiver.  Hepatitis A immunization. ** / Consult your caregiver. 2 doses, 6 to 18 months apart.  Hepatitis B immunization.** / Check with your caregiver. 3 doses, usually over 6 months. **Family history and personal history of risk and conditions may change your caregiver's recommendations. Document Released: 12/14/2001 Document Revised: 01/10/2012 Document Reviewed: 03/15/2011 [employer] Patient Information 2013 [employer].  

## 2012-11-07 ENCOUNTER — Encounter: Payer: Self-pay | Admitting: Internal Medicine

## 2012-11-16 ENCOUNTER — Encounter: Payer: Self-pay | Admitting: Internal Medicine

## 2012-11-21 ENCOUNTER — Encounter: Payer: Self-pay | Admitting: Internal Medicine

## 2012-11-21 ENCOUNTER — Ambulatory Visit (INDEPENDENT_AMBULATORY_CARE_PROVIDER_SITE_OTHER): Payer: Worker's Compensation | Admitting: Internal Medicine

## 2012-11-21 VITALS — BP 122/80 | HR 64 | Ht 74.0 in | Wt 239.2 lb

## 2012-11-21 DIAGNOSIS — Z1211 Encounter for screening for malignant neoplasm of colon: Secondary | ICD-10-CM

## 2012-11-21 DIAGNOSIS — Z1212 Encounter for screening for malignant neoplasm of rectum: Secondary | ICD-10-CM

## 2012-11-21 DIAGNOSIS — R195 Other fecal abnormalities: Secondary | ICD-10-CM

## 2012-11-21 DIAGNOSIS — K219 Gastro-esophageal reflux disease without esophagitis: Secondary | ICD-10-CM

## 2012-11-21 MED ORDER — PEG-KCL-NACL-NASULF-NA ASC-C 100 G PO SOLR: 1.0000 | Freq: Once | ORAL | Status: DC

## 2012-11-21 MED ORDER — ESOMEPRAZOLE MAGNESIUM 40 MG PO PACK: 40.0000 mg | PACK | Freq: Every day | ORAL | Status: DC

## 2012-11-21 NOTE — Progress Notes (Signed)
Patient ID: Michael Pearson, male   DOB: 01/14/1961, 52 y.o.   MRN: [id number]  SUBJECTIVE: HPI Michael Pearson is a 52 year old male with a past medical history of CAD, pituitary microadenoma, migraines, hypertension, hyperlipidemia, GERD who seen in consultation at the request of Dr. Laury Axon for evaluation of heme + stool.  He is alone today. He states that he doesn't think he has ever had a screening colonoscopy, recalls a flexible sigmoidoscopy from several years ago. He does report occasional bright red blood on the toilet tissue. He denies frank hematochezia or melena. He occasionally reports lower abdominal discomfort, but this does not always relate a bowel movement. He reports he is eating well. He does have occasional heartburn, worse at night. No dysphagia or odynophagia. He was previously on PPI but cannot afford this medication. He does have ranitidine at home which seems to help as well. No nausea or vomiting. No weight loss. Good appetite.  EGD, date 03/05/2011, Dr. Jeani Hawking -- mild hiatal hernia, mild duodenitis. Flexible sigmoidoscopy, date 03/05/2011, Dr. Jeani Hawking -- colitis with scattered ulceration extending from rectum to 30 cm. Medium internal hemorrhoids. Biopsy. Pathology = erosion with active inflammation. No granulomas. Differential infectious versus ischemic.   Review of Systems  As per history of present illness, otherwise negative   Past Medical History  Diagnosis Date  . Migraines     Has been evaluated multiple times in past for chronic headache in which pt has had occasional nose bleeds and bloodshot eyes. This lasted for 4-5 months. CT of the head was negative in May 2012.  [name]     MRI in 2008 demonstrating 4.6 mm area of pituitary gland   . Duodenitis     May 2012 with heme positive stools at this time. Endorses only rare streaking of blood now.  . Colitis   . CAD (coronary artery disease)     NSTEMI 7/12:  Cardiac cath on 7/17: pLAD occluded, Dx 30-40%,  pRCA 30-40%, mRCA 30-40%.  Proximal LAD was treated with a BMS.  Echo 7/19:  EF 60-65%, mild LVH  . RBBB (right bundle branch block)     Noted on EKG in 2008  . Kidney stones   . Hyperlipidemia   . Hypertension   . Myocardial infarction 05/17/2011  . Allergy   . Asthma   . Hiatal hernia   . Duodenitis   . Ulcer   . Hx of hemorrhoids     Current Outpatient Prescriptions  Medication Sig Dispense Refill  . acetaminophen (TYLENOL) 325 MG tablet Take 650 mg by mouth 2 (two) times daily as needed.        Marland Kitchen albuterol (PROVENTIL HFA;VENTOLIN HFA) 108 (90 BASE) MCG/ACT inhaler Inhale 2 puffs into the lungs every 4 (four) hours as needed for wheezing or shortness of breath.  1 Inhaler  11  . aspirin 81 MG tablet Take 81 mg by mouth daily.        Marland Kitchen atorvastatin (LIPITOR) 40 MG tablet Take 1 tablet (40 mg total) by mouth daily.  30 tablet  9  . beclomethasone (QVAR) 40 MCG/ACT inhaler Inhale 2 puffs into the lungs 2 (two) times daily.  1 Inhaler  12  . losartan (COZAAR) 25 MG tablet Take 1 tablet (25 mg total) by mouth daily.  90 tablet  3  . metoprolol tartrate (LOPRESSOR) 25 MG tablet TAKE 1/2 TABLET BY MOUTH 2 TIMES DAILY  30 tablet  9  . mometasone (NASONEX) 50 MCG/ACT nasal spray  Place 2 sprays into the nose daily.  17 g  2  . Multiple Vitamin (MULTIVITAMIN) tablet Take 1 tablet by mouth daily.        . nitroGLYCERIN (NITROSTAT) 0.4 MG SL tablet Place 0.4 mg under the tongue every 5 (five) minutes as needed.        . ranitidine (ZANTAC) 150 MG tablet Take 1 tablet (150 mg total) by mouth 2 (two) times daily.  60 tablet  5  . esomeprazole (NEXIUM) 40 MG packet Take 40 mg by mouth daily before breakfast.  30 each  12  . peg 3350 powder (MOVIPREP) 100 G SOLR Take 1 kit (100 g total) by mouth once.  1 kit  0    No Known Allergies  Family History  Problem Relation Age of Onset  . Stroke Mother   . Heart attack Father 30    MI  . Colon polyps Maternal Aunt   . Obesity Brother   . Coronary  artery disease Brother     Possible, pt not sure of specifics  . Hypertension Brother   . Skin cancer Father   . Colon polyps Paternal Grandmother   . Colon cancer Neg Hx     History  Substance Use Topics  . Smoking status: Never Smoker   . Smokeless tobacco: Never Used  . Alcohol Use: No    OBJECTIVE: BP 122/80  Pulse 64  Ht 6\' 2"  (1.88 m)  Wt 239 lb 3.2 oz (108.5 kg)  BMI 30.71 kg/m2 Constitutional: Well-developed and well-nourished. No distress. HEENT: Normocephalic and atraumatic. Oropharynx is clear and moist. No oropharyngeal exudate. Conjunctivae are normal. No scleral icterus. Cardiovascular: Normal rate, regular rhythm and intact distal pulses. No M/R/G Pulmonary/chest: Effort normal and breath sounds normal. No wheezing, rales or rhonchi. Abdominal: Soft, nontender, nondistended. Bowel sounds active throughout. There are no masses palpable. No hepatosplenomegaly. Extremities: no clubbing, cyanosis, or edema Neurological: Alert and oriented to person place and time. Skin: Skin is warm and dry. No rashes noted. Psychiatric: Normal mood and affect. Behavior is normal.  Labs and Imaging -- CBC    Component Value Date/Time   WBC 5.4 11/03/2012 0909   RBC 4.57 11/03/2012 0909   HGB 14.7 11/03/2012 0909   HCT 43.6 11/03/2012 0909   PLT 203.0 11/03/2012 0909   MCV 95.3 11/03/2012 0909   MCH 31.6 05/19/2011 0340   MCHC 33.8 11/03/2012 0909   RDW 13.6 11/03/2012 0909   LYMPHSABS 1.7 11/03/2012 0909   MONOABS 0.4 11/03/2012 0909   EOSABS 0.4 11/03/2012 0909   BASOSABS 0.1 11/03/2012 0909    CMP     Component Value Date/Time   NA 140 11/03/2012 0909   K 3.8 11/03/2012 0909   CL 103 11/03/2012 0909   CO2 30 11/03/2012 0909   GLUCOSE 92 11/03/2012 0909   BUN 13 11/03/2012 0909   CREATININE 0.8 11/03/2012 0909   CALCIUM 9.9 11/03/2012 0909   PROT 7.0 11/03/2012 0909   ALBUMIN 4.3 11/03/2012 0909   AST 26 11/03/2012 0909   ALT 31 11/03/2012 0909   ALKPHOS 55 11/03/2012 0909   BILITOT 1.1 11/03/2012 0909    GFRNONAA >60 05/19/2011 0340   GFRAA >60 05/19/2011 0340    ASSESSMENT AND PLAN: 52 year old male with a past medical history of CAD, pituitary microadenoma, migraines, hypertension, hyperlipidemia, GERD who seen in consultation at the request of Dr. Laury Axon for evaluation of heme + stool  1.  Heme + stool/hx of colitis (likely infectious) --  I recommended full colonoscopy for colorectal cancer screening and given his recent heme positive stool found by his PCP.  It sounds like he has some anorectal bleeding, possibly from hemorrhoids, but based on his age and prior findings a flexible sigmoidoscopy, I feel that colonoscopy is warranted. He is without insurance at this point, and requests that this be scheduled for March 2014, at which point he feels he will have insurance. We will give him a day for March 2014. Further recommendations thereafter. We discussed the test including the risks and benefits and he is agreeable to proceed.  2.  GERD -- no alarm symptoms at present. He is given samples of Nexium 40 mg daily and instructed to take this 30 minutes to one hour for breakfast. He can come back for samples until he can afford the outpatient prescription. He can use ranitidine twice a day in place of Nexium if this is more affordable.

## 2012-11-21 NOTE — Patient Instructions (Addendum)
You have been scheduled for a colonoscopy with propofol. Please follow written instructions given to you at your visit today.  Please pick up your prep kit at the pharmacy within the next 1-3 days. If you use inhalers (even only as needed) or a CPAP machine, please bring them with you on the day of your procedure.  You have been given samples of Nexium please take 1 capsule daily 30 minutes prior to your first meal of the day.

## 2013-01-12 ENCOUNTER — Telehealth: Payer: Self-pay | Admitting: Cardiology

## 2013-01-12 NOTE — Telephone Encounter (Signed)
Walk in pt Form " Pt Needs Letter about Heart attack" gave to  Lauren 01/12/13/KM

## 2013-01-24 ENCOUNTER — Encounter: Payer: Self-pay | Admitting: Internal Medicine

## 2013-02-03 ENCOUNTER — Encounter (HOSPITAL_COMMUNITY): Payer: Self-pay | Admitting: *Deleted

## 2013-02-03 ENCOUNTER — Inpatient Hospital Stay (HOSPITAL_COMMUNITY)
Admission: EM | Admit: 2013-02-03 | Discharge: 2013-02-05 | DRG: 392 | Disposition: A | Discharge status: Home / Self Care | Payer: MEDICAID | Attending: Internal Medicine | Admitting: Internal Medicine

## 2013-02-03 ENCOUNTER — Emergency Department (HOSPITAL_COMMUNITY): Payer: Self-pay

## 2013-02-03 DIAGNOSIS — G43909 Migraine, unspecified, not intractable, without status migrainosus: Secondary | ICD-10-CM

## 2013-02-03 DIAGNOSIS — D72829 Elevated white blood cell count, unspecified: Secondary | ICD-10-CM | POA: Diagnosis present

## 2013-02-03 DIAGNOSIS — S99929A Unspecified injury of unspecified foot, initial encounter: Secondary | ICD-10-CM

## 2013-02-03 DIAGNOSIS — R05 Cough: Secondary | ICD-10-CM

## 2013-02-03 DIAGNOSIS — K5792 Diverticulitis of intestine, part unspecified, without perforation or abscess without bleeding: Secondary | ICD-10-CM | POA: Diagnosis present

## 2013-02-03 DIAGNOSIS — J4520 Mild intermittent asthma, uncomplicated: Secondary | ICD-10-CM

## 2013-02-03 DIAGNOSIS — J309 Allergic rhinitis, unspecified: Secondary | ICD-10-CM

## 2013-02-03 DIAGNOSIS — Z79899 Other long term (current) drug therapy: Secondary | ICD-10-CM

## 2013-02-03 DIAGNOSIS — K5732 Diverticulitis of large intestine without perforation or abscess without bleeding: Principal | ICD-10-CM

## 2013-02-03 DIAGNOSIS — Z87442 Personal history of urinary calculi: Secondary | ICD-10-CM

## 2013-02-03 DIAGNOSIS — R195 Other fecal abnormalities: Secondary | ICD-10-CM

## 2013-02-03 DIAGNOSIS — I10 Essential (primary) hypertension: Secondary | ICD-10-CM | POA: Diagnosis present

## 2013-02-03 DIAGNOSIS — I252 Old myocardial infarction: Secondary | ICD-10-CM

## 2013-02-03 DIAGNOSIS — I214 Non-ST elevation (NSTEMI) myocardial infarction: Secondary | ICD-10-CM

## 2013-02-03 DIAGNOSIS — E785 Hyperlipidemia, unspecified: Secondary | ICD-10-CM | POA: Diagnosis present

## 2013-02-03 DIAGNOSIS — I2510 Atherosclerotic heart disease of native coronary artery without angina pectoris: Secondary | ICD-10-CM

## 2013-02-03 DIAGNOSIS — J45909 Unspecified asthma, uncomplicated: Secondary | ICD-10-CM | POA: Diagnosis present

## 2013-02-03 LAB — URINALYSIS, MICROSCOPIC ONLY
Glucose, UA: NEGATIVE mg/dL
Leukocytes, UA: NEGATIVE
Nitrite: NEGATIVE
Protein, ur: NEGATIVE mg/dL
Urobilinogen, UA: 0.2 mg/dL (ref 0.0–1.0)

## 2013-02-03 LAB — CBC
MCH: 31.5 pg (ref 26.0–34.0)
MCHC: 34.3 g/dL (ref 30.0–36.0)
MCV: 91.9 fL (ref 78.0–100.0)
Platelets: 217 10*3/uL (ref 150–400)
RDW: 12.6 % (ref 11.5–15.5)
WBC: 10.7 10*3/uL — ABNORMAL HIGH (ref 4.0–10.5)

## 2013-02-03 LAB — COMPREHENSIVE METABOLIC PANEL
AST: 18 U/L (ref 0–37)
Albumin: 3.4 g/dL — ABNORMAL LOW (ref 3.5–5.2)
Calcium: 9.9 mg/dL (ref 8.4–10.5)
Creatinine, Ser: 0.73 mg/dL (ref 0.50–1.35)

## 2013-02-03 MED ORDER — FAMOTIDINE 20 MG PO TABS
20.0000 mg | ORAL_TABLET | Freq: Two times a day (BID) | ORAL | Status: DC
  Administered 2013-02-03 – 2013-02-05 (×4): 20 mg via ORAL
  Filled 2013-02-03 (×5): qty 1

## 2013-02-03 MED ORDER — ALBUTEROL SULFATE HFA 108 (90 BASE) MCG/ACT IN AERS
2.0000 | INHALATION_SPRAY | RESPIRATORY_TRACT | Status: DC | PRN
  Filled 2013-02-03: qty 6.7

## 2013-02-03 MED ORDER — ESOMEPRAZOLE MAGNESIUM 40 MG PO PACK: 40.0000 mg | PACK | Freq: Every day | ORAL | Status: DC

## 2013-02-03 MED ORDER — PANTOPRAZOLE SODIUM 40 MG PO PACK
40.0000 mg | PACK | Freq: Every day | ORAL | Status: DC
  Administered 2013-02-03 – 2013-02-05 (×3): 40 mg via ORAL
  Filled 2013-02-03 (×3): qty 20

## 2013-02-03 MED ORDER — HYDROMORPHONE HCL PF 1 MG/ML IJ SOLN: 0.5000 mg | INTRAMUSCULAR | Status: DC | PRN

## 2013-02-03 MED ORDER — ONDANSETRON HCL 4 MG/2ML IJ SOLN: 4.0000 mg | Freq: Four times a day (QID) | INTRAMUSCULAR | Status: DC | PRN

## 2013-02-03 MED ORDER — METOPROLOL TARTRATE 12.5 MG HALF TABLET
12.5000 mg | ORAL_TABLET | Freq: Two times a day (BID) | ORAL | Status: DC
  Administered 2013-02-03 – 2013-02-05 (×4): 12.5 mg via ORAL
  Filled 2013-02-03 (×5): qty 1

## 2013-02-03 MED ORDER — METRONIDAZOLE IN NACL 5-0.79 MG/ML-% IV SOLN: 500.0000 mg | Freq: Once | INTRAVENOUS | Status: DC

## 2013-02-03 MED ORDER — FLUTICASONE PROPIONATE 50 MCG/ACT NA SUSP
1.0000 | Freq: Every day | NASAL | Status: DC
  Filled 2013-02-03: qty 16

## 2013-02-03 MED ORDER — CIPROFLOXACIN IN D5W 400 MG/200ML IV SOLN
400.0000 mg | Freq: Two times a day (BID) | INTRAVENOUS | Status: DC
  Administered 2013-02-04 – 2013-02-05 (×4): 400 mg via INTRAVENOUS
  Filled 2013-02-03 (×4): qty 200

## 2013-02-03 MED ORDER — ONDANSETRON HCL 4 MG/2ML IJ SOLN
4.0000 mg | Freq: Once | INTRAMUSCULAR | Status: AC
  Administered 2013-02-03: 4 mg via INTRAVENOUS
  Filled 2013-02-03: qty 2

## 2013-02-03 MED ORDER — NITROGLYCERIN 0.4 MG SL SUBL: 0.4000 mg | SUBLINGUAL_TABLET | SUBLINGUAL | Status: DC | PRN

## 2013-02-03 MED ORDER — IOHEXOL 300 MG/ML  SOLN
100.0000 mL | Freq: Once | INTRAMUSCULAR | Status: AC | PRN
  Administered 2013-02-03: 100 mL via INTRAVENOUS

## 2013-02-03 MED ORDER — SODIUM CHLORIDE 0.9 % IV BOLUS (SEPSIS)
1000.0000 mL | Freq: Once | INTRAVENOUS | Status: AC
  Administered 2013-02-03: 1000 mL via INTRAVENOUS

## 2013-02-03 MED ORDER — ONDANSETRON HCL 4 MG PO TABS: 4.0000 mg | ORAL_TABLET | Freq: Four times a day (QID) | ORAL | Status: DC | PRN

## 2013-02-03 MED ORDER — LOSARTAN POTASSIUM 25 MG PO TABS
25.0000 mg | ORAL_TABLET | Freq: Every day | ORAL | Status: DC
  Administered 2013-02-04 – 2013-02-05 (×2): 25 mg via ORAL
  Filled 2013-02-03 (×2): qty 1

## 2013-02-03 MED ORDER — ASPIRIN EC 81 MG PO TBEC
81.0000 mg | DELAYED_RELEASE_TABLET | Freq: Every day | ORAL | Status: DC
  Administered 2013-02-03 – 2013-02-05 (×3): 81 mg via ORAL
  Filled 2013-02-03 (×3): qty 1

## 2013-02-03 MED ORDER — FLUTICASONE PROPIONATE HFA 44 MCG/ACT IN AERO
1.0000 | INHALATION_SPRAY | Freq: Two times a day (BID) | RESPIRATORY_TRACT | Status: DC
  Filled 2013-02-03 (×2): qty 10.6

## 2013-02-03 MED ORDER — ENOXAPARIN SODIUM 60 MG/0.6ML ~~LOC~~ SOLN
50.0000 mg | SUBCUTANEOUS | Status: DC
  Administered 2013-02-03 – 2013-02-04 (×2): 50 mg via SUBCUTANEOUS
  Filled 2013-02-03 (×3): qty 0.6

## 2013-02-03 MED ORDER — MORPHINE SULFATE 4 MG/ML IJ SOLN
4.0000 mg | Freq: Once | INTRAMUSCULAR | Status: AC
  Administered 2013-02-03: 4 mg via INTRAVENOUS
  Filled 2013-02-03: qty 1

## 2013-02-03 MED ORDER — METRONIDAZOLE IN NACL 5-0.79 MG/ML-% IV SOLN
500.0000 mg | Freq: Three times a day (TID) | INTRAVENOUS | Status: DC
  Administered 2013-02-03 – 2013-02-05 (×6): 500 mg via INTRAVENOUS
  Filled 2013-02-03 (×8): qty 100

## 2013-02-03 MED ORDER — HYDROCODONE-ACETAMINOPHEN 5-325 MG PO TABS: 1.0000 | ORAL_TABLET | ORAL | Status: DC | PRN

## 2013-02-03 MED ORDER — IOHEXOL 300 MG/ML  SOLN
50.0000 mL | Freq: Once | INTRAMUSCULAR | Status: AC | PRN
  Administered 2013-02-03: 50 mL via ORAL

## 2013-02-03 MED ORDER — ASPIRIN 81 MG PO TABS: 81.0000 mg | ORAL_TABLET | Freq: Every day | ORAL | Status: DC

## 2013-02-03 MED ORDER — SODIUM CHLORIDE 0.9 % IV SOLN: INTRAVENOUS | Status: AC

## 2013-02-03 MED ORDER — CIPROFLOXACIN IN D5W 400 MG/200ML IV SOLN
400.0000 mg | Freq: Once | INTRAVENOUS | Status: AC
  Administered 2013-02-03: 400 mg via INTRAVENOUS
  Filled 2013-02-03: qty 200

## 2013-02-03 NOTE — ED Notes (Signed)
Pain and nausea med given to RN pt moved to room 8

## 2013-02-03 NOTE — ED Provider Notes (Signed)
History     CSN: [id number]  Arrival date & time 02/03/13  0927   First MD Initiated Contact with Patient 02/03/13 [phone]      Chief Complaint  Patient presents with  . Abdominal Pain  . Urinary Frequency    (Consider location/radiation/quality/duration/timing/severity/associated sxs/prior treatment) Patient is a 52 y.o. male presenting with abdominal pain and frequency. The history is provided by the patient.  Abdominal Pain Associated symptoms: no chest pain, no chills, no fever and no shortness of breath   Urinary Frequency Associated symptoms include abdominal pain. Pertinent negatives include no chest pain, no headaches and no shortness of breath.  pt c/o left lower abd pain for past week. Constant. Dull. Non radiating. No specific exacerbating or alleviating factors. Denies prior hx same pain. Had single sl loose stool today. No diarrhea. Nausea. Had single episode emesis, clear, not bloody. No dysuria or hematuria. Remote hx kidney stones, state felt different. No current back or flank pain. No fever or chills. No recent new meds or abx use. Denies hx diverticulitis, but diverticula noted on prior endoscopy.     Past Medical History  Diagnosis Date  . Migraines     Has been evaluated multiple times in past for chronic headache in which pt has had occasional nose bleeds and bloodshot eyes. This lasted for 4-5 months. CT of the head was negative in May 2012.  [name]     MRI in 2008 demonstrating 4.6 mm area of pituitary gland   . Duodenitis     May 2012 with heme positive stools at this time. Endorses only rare streaking of blood now.  . Colitis   . CAD (coronary artery disease)     NSTEMI 7/12:  Cardiac cath on 7/17: pLAD occluded, Dx 30-40%, pRCA 30-40%, mRCA 30-40%.  Proximal LAD was treated with a BMS.  Echo 7/19:  EF 60-65%, mild LVH  . RBBB (right bundle branch block)     Noted on EKG in 2008  . Kidney stones   . Hyperlipidemia   . Hypertension   . Myocardial  infarction 05/17/2011  . Allergy   . Asthma   . Hiatal hernia   . Duodenitis   . Ulcer   . Hx of hemorrhoids     Past Surgical History  Procedure Laterality Date  . Hernia repair      In 20's  . Tooth extraction    . Hammer toe surgery    . Egd  03/05/2011    Dr. Elnoria Howard  . Flexible sigmoidoscopy  03/05/2011    Dr. Elnoria Howard    Family History  Problem Relation Age of Onset  . Stroke Mother   . Heart attack Father 34    MI  . Colon polyps Maternal Aunt   . Obesity Brother   . Coronary artery disease Brother     Possible, pt not sure of specifics  . Hypertension Brother   . Skin cancer Father   . Colon polyps Paternal Grandmother   . Colon cancer Neg Hx     History  Substance Use Topics  . Smoking status: Never Smoker   . Smokeless tobacco: Never Used  . Alcohol Use: No      Review of Systems  Constitutional: Negative for fever and chills.  HENT: Negative for neck pain.   Eyes: Negative for redness.  Respiratory: Negative for shortness of breath.   Cardiovascular: Negative for chest pain.  Gastrointestinal: Positive for abdominal pain.  Genitourinary: Negative for flank pain.  Musculoskeletal: Negative for back pain.  Skin: Negative for rash.  Neurological: Negative for headaches.  Hematological: Does not bruise/bleed easily.  Psychiatric/Behavioral: Negative for confusion.    Allergies  Review of patient's allergies indicates no known allergies.  Home Medications   Current Outpatient Rx  Name  Route  Sig  Dispense  Refill  . acetaminophen (TYLENOL) 325 MG tablet   Oral   Take 650 mg by mouth 2 (two) times daily as needed.           Marland Kitchen aspirin 81 MG tablet   Oral   Take 81 mg by mouth daily.           Marland Kitchen atorvastatin (LIPITOR) 40 MG tablet   Oral   Take 1 tablet (40 mg total) by mouth daily.   30 tablet   9   . esomeprazole (NEXIUM) 40 MG packet   Oral   Take 40 mg by mouth daily before breakfast.   30 each   12     Samples given to patient     Lot#                   ...   . losartan (COZAAR) 25 MG tablet   Oral   Take 1 tablet (25 mg total) by mouth daily.   90 tablet   3   . metoprolol tartrate (LOPRESSOR) 25 MG tablet      TAKE 1/2 TABLET BY MOUTH 2 TIMES DAILY   30 tablet   9   . Multiple Vitamin (MULTIVITAMIN) tablet   Oral   Take 1 tablet by mouth daily.           . nitroGLYCERIN (NITROSTAT) 0.4 MG SL tablet   Sublingual   Place 0.4 mg under the tongue every 5 (five) minutes as needed.           . ranitidine (ZANTAC) 150 MG tablet   Oral   Take 1 tablet (150 mg total) by mouth 2 (two) times daily.   60 tablet   5   . EXPIRED: albuterol (PROVENTIL HFA;VENTOLIN HFA) 108 (90 BASE) MCG/ACT inhaler   Inhalation   Inhale 2 puffs into the lungs every 4 (four) hours as needed for wheezing or shortness of breath.   1 Inhaler   11   . EXPIRED: beclomethasone (QVAR) 40 MCG/ACT inhaler   Inhalation   Inhale 2 puffs into the lungs 2 (two) times daily.   1 Inhaler   12   . EXPIRED: mometasone (NASONEX) 50 MCG/ACT nasal spray   Nasal   Place 2 sprays into the nose daily.   17 g   2   . peg 3350 powder (MOVIPREP) 100 G SOLR   Oral   Take 1 kit (100 g total) by mouth once.   1 kit   0     BP 130/78  Pulse 84  Temp(Src) 98.2 F (36.8 C) (Oral)  Resp 16  Ht 6\' 2"  (1.88 m)  Wt 240 lb (108.863 kg)  BMI 30.8 kg/m2  SpO2 95%  Physical Exam  Nursing note and vitals reviewed. Constitutional: He is oriented to person, place, and time. He appears well-developed and well-nourished. No distress.  HENT:  Mouth/Throat: Oropharynx is clear and moist.  Eyes: Pupils are equal, round, and reactive to light.  Neck: Neck supple. No tracheal deviation present.  Cardiovascular: Normal rate, regular rhythm, normal heart sounds and intact distal pulses.   Pulmonary/Chest: Effort normal and breath sounds normal. No  accessory muscle usage. No respiratory distress.  Abdominal: Soft. Bowel sounds are normal. He  exhibits no distension and no mass. There is tenderness. There is no rebound and no guarding.  LLQ tenderness  Genitourinary:  No cva tenderness  Musculoskeletal: Normal range of motion.  Neurological: He is alert and oriented to person, place, and time.  Skin: Skin is warm and dry.  Psychiatric: He has a normal mood and affect.    ED Course  Procedures (including critical care time)  Results for orders placed during the hospital encounter of 02/03/13  URINALYSIS, MICROSCOPIC ONLY      Result Value Range   Color, Urine AMBER (*) YELLOW   APPearance CLEAR  CLEAR   Specific Gravity, Urine 1.031 (*) 1.005 - 1.030   pH 6.5  5.0 - 8.0   Glucose, UA NEGATIVE  NEGATIVE mg/dL   Hgb urine dipstick NEGATIVE  NEGATIVE   Bilirubin Urine NEGATIVE  NEGATIVE   Ketones, ur NEGATIVE  NEGATIVE mg/dL   Protein, ur NEGATIVE  NEGATIVE mg/dL   Urobilinogen, UA 0.2  0.0 - 1.0 mg/dL   Nitrite NEGATIVE  NEGATIVE   Leukocytes, UA NEGATIVE  NEGATIVE   WBC, UA 0-2  <3 WBC/hpf   RBC / HPF 0-2  <3 RBC/hpf   Squamous Epithelial / LPF RARE  RARE  CBC      Result Value Range   WBC 10.7 (*) 4.0 - 10.5 K/uL   RBC 4.44  4.22 - 5.81 MIL/uL   Hemoglobin 14.0  13.0 - 17.0 g/dL   HCT 78.4  69.6 - 29.5 %   MCV 91.9  78.0 - 100.0 fL   MCH 31.5  26.0 - 34.0 pg   MCHC 34.3  30.0 - 36.0 g/dL   RDW 28.4  13.2 - 44.0 %   Platelets 217  150 - 400 K/uL  COMPREHENSIVE METABOLIC PANEL      Result Value Range   Sodium 133 (*) 135 - 145 mEq/L   Potassium 3.8  3.5 - 5.1 mEq/L   Chloride 95 (*) 96 - 112 mEq/L   CO2 30  19 - 32 mEq/L   Glucose, Bld 111 (*) 70 - 99 mg/dL   BUN 9  6 - 23 mg/dL   Creatinine, Ser 1.02  0.50 - 1.35 mg/dL   Calcium 9.9  8.4 - 72.5 mg/dL   Total Protein 7.1  6.0 - 8.3 g/dL   Albumin 3.4 (*) 3.5 - 5.2 g/dL   AST 18  0 - 37 U/L   ALT 27  0 - 53 U/L   Alkaline Phosphatase 70  39 - 117 U/L   Total Bilirubin 0.6  0.3 - 1.2 mg/dL   GFR calc non Af Amer >90  >90 mL/min   GFR calc Af Amer >90   >90 mL/min   Ct Abdomen Pelvis W Contrast  02/03/2013  *RADIOLOGY REPORT*  Clinical Data:  left lower quadrant pain.  Rule out diverticulitis  CT ABDOMEN AND PELVIS WITH CONTRAST  Technique:  Multidetector CT imaging of the abdomen and pelvis was performed following the standard protocol during bolus administration of intravenous contrast.  Contrast: OMNIPAQUE IOHEXOL 300 MG/ML  SOLN  Comparison: 03/03/2011  Findings: Lung bases are clear.  No pericardial or pleural effusion.  Mild diffuse low attenuation throughout the liver noted.  No focal liver abnormalities identified.  The gallbladder appears within normal limits.  No biliary dilatation.  Normal appearance of the pancreas.  The spleen appears normal.  The  adrenal glands are both unremarkable.  The kidneys appear normal.  The urinary bladder is unremarkable.  Prostate gland and seminal vesicles are negative.  Normal caliber of the abdominal aorta.  There are no enlarged upper abdominal lymph nodes.  No pelvic or inguinal adenopathy.  The stomach appears normal.  The proximal small bowel loops are mildly increased in caliber measuring up to 3.7 cm.  Mid and distal small bowel are normal in caliber.  The proximal colon is unremarkable.  There is abnormal wall thickening and inflammatory change involving the sigmoid colon compatible with acute diverticulitis.  Perforated diverticula is identified on image 57 of series 2.  The extra colonic gas appears localized to the left lower quadrant within the pericolonic fat.  No focal fluid collections identified.  A small amount of free fluid is noted within the dependent portion of the pelvis.  Review of the visualized bony structures is significant for degenerative disc disease at L5-S1.  There is an anterolisthesis of L5 on S1 which measures 10 mm, image 68/series 6.  IMPRESSION:  1.  Acute sigmoid diverticulitis. 2.  Perforated diverticula is identified within the left lower quadrant with a focal area of  localized extra colonic gas. 3.  No evidence for abscess formation or bowel obstruction. 4.  Hepatic steatosis.   Original Report Authenticated By: Signa Kell, M.D.        MDM  Iv ns. Labs. Morphine iv. zofran iv.  Reviewed nursing notes and prior charts for additional history.   Recheck pain improved meds.  Ct c/w diverticulitis.   cipro and flagyl iv.  Recheck no change in abd exam from prior.  Med service called to admit.           Suzi Roots, MD 02/03/13 1210

## 2013-02-03 NOTE — ED Notes (Signed)
Pt reports onset of LLQ abd pain that started last Sunday. Reports urinary frequency, states he urinates "every 20 minutes". Reports constipation since last night. Hx of kidney stones. Pain worse with exertion, n/v present. Denies hematuria, states he saw two "little specs" in urine yesterday, thinks it may have been a stone. Pain 9/10

## 2013-02-03 NOTE — H&P (Signed)
[facility] History and Physical  Michael Pearson UVO:[id number] DOB: 1961/01/15 DOA: 02/03/2013  Referring physician: ED physician PCP: Loreen Freud, DO   Chief Complaint: Abdominal pain  HPI:  Pt is 52 yo male who presents to [facility] ED with main concern of progressively worsening left lower quadrant pain that initially started several days prior to admission, dull and occasionally sharp, constant in nature, 7/10 in severity, located in the left lower quadrant, non radiating, associated with single episode of non bloody vomiting and non bloody diarrhea, no specific aggravating or alleviating factors, no similar events in the past. Pt denies fevers, chills, chest pain or shortness of breath.   In ED, CT abdomen indicative of sigmoid diverticulitis, pt started on Ciprofloxacin and Flagyl in ED. [facility] asked to admit for further evaluation.  Assessment and Plan:  Principal Problem:   Acute diverticulitis - agree with continuing Ciprofloxacin and flagyl - will provide supportive care, IVF, analgesia and antiemetics as needed - may advance diet if pt able to tolerate  Active Problems:   HTN (hypertension) - reasonable control on admission - continue Metoprolol and Cozaar   Hyperlipidemia - continue statin   Code Status: Full Family Communication: Pt at bedside Disposition Plan: Admit to medical floor   Review of Systems:  Constitutional: Negative for fever, chills and malaise/fatigue. Negative for diaphoresis.  HENT: Negative for hearing loss, ear pain, nosebleeds, congestion, sore throat, neck pain, tinnitus and ear discharge.   Eyes: Negative for blurred vision, double vision, photophobia, pain, discharge and redness.  Respiratory: Negative for cough, hemoptysis, sputum production, shortness of breath, wheezing and stridor.   Cardiovascular: Negative for chest pain, palpitations, orthopnea, claudication and leg swelling.  Gastrointestinal: Positive for nausea, vomiting and abdominal  pain. Negative for heartburn, constipation, blood in stool and melena.  Genitourinary: Negative for dysuria, urgency, frequency, hematuria and flank pain.  Musculoskeletal: Negative for myalgias, back pain, joint pain and falls.  Skin: Negative for itching and rash.  Neurological: Negative for dizziness and weakness. Negative for tingling, tremors, sensory change, speech change, focal weakness, loss of consciousness and headaches.  Endo/Heme/Allergies: Negative for environmental allergies and polydipsia. Does not bruise/bleed easily.  Psychiatric/Behavioral: Negative for suicidal ideas. The patient is not nervous/anxious.      Past Medical History  Diagnosis Date  . Migraines     Has been evaluated multiple times in past for chronic headache in which pt has had occasional nose bleeds and bloodshot eyes. This lasted for 4-5 months. CT of the head was negative in May 2012.  [name]     MRI in 2008 demonstrating 4.6 mm area of pituitary gland   . Duodenitis     May 2012 with heme positive stools at this time. Endorses only rare streaking of blood now.  . Colitis   . CAD (coronary artery disease)     NSTEMI 7/12:  Cardiac cath on 7/17: pLAD occluded, Dx 30-40%, pRCA 30-40%, mRCA 30-40%.  Proximal LAD was treated with a BMS.  Echo 7/19:  EF 60-65%, mild LVH  . RBBB (right bundle branch block)     Noted on EKG in 2008  . Kidney stones   . Hyperlipidemia   . Hypertension   . Myocardial infarction 05/17/2011  . Allergy   . Asthma   . Hiatal hernia   . Duodenitis   . Ulcer   . Hx of hemorrhoids     Past Surgical History  Procedure Laterality Date  . Hernia repair  In 20's  . Tooth extraction    . Hammer toe surgery    . Egd  03/05/2011    Dr. Elnoria Howard  . Flexible sigmoidoscopy  03/05/2011    Dr. Elnoria Howard    Social History:  reports that he has never smoked. He has never used smokeless tobacco. He reports that he does not drink alcohol or use illicit drugs.  No Known  Allergies  Family History  Problem Relation Age of Onset  . Stroke Mother   . Heart attack Father 74    MI  . Colon polyps Maternal Aunt   . Obesity Brother   . Coronary artery disease Brother     Possible, pt not sure of specifics  . Hypertension Brother   . Skin cancer Father   . Colon polyps Paternal Grandmother   . Colon cancer Neg Hx     Prior to Admission medications   Medication Sig Start Date End Date Taking? Authorizing Provider  acetaminophen (TYLENOL) 325 MG tablet Take 650 mg by mouth 2 (two) times daily as needed.     Yes Historical Provider, MD  aspirin 81 MG tablet Take 81 mg by mouth daily.     Yes Historical Provider, MD  atorvastatin (LIPITOR) 40 MG tablet Take 1 tablet (40 mg total) by mouth daily. 11/03/12  Yes Lelon Perla, DO  esomeprazole (NEXIUM) 40 MG packet Take 40 mg by mouth daily before breakfast. 11/21/12  Yes Beverley Fiedler, MD  losartan (COZAAR) 25 MG tablet Take 1 tablet (25 mg total) by mouth daily. 11/03/12  Yes Lelon Perla, DO  metoprolol tartrate (LOPRESSOR) 25 MG tablet TAKE 1/2 TABLET BY MOUTH 2 TIMES DAILY 07/20/12  Yes Tonny Bollman, MD  Multiple Vitamin (MULTIVITAMIN) tablet Take 1 tablet by mouth daily.     Yes Historical Provider, MD  nitroGLYCERIN (NITROSTAT) 0.4 MG SL tablet Place 0.4 mg under the tongue every 5 (five) minutes as needed.     Yes Historical Provider, MD  ranitidine (ZANTAC) 150 MG tablet Take 1 tablet (150 mg total) by mouth 2 (two) times daily. 11/03/12  Yes Yvonne R Lowne, DO  albuterol (PROVENTIL HFA;VENTOLIN HFA) 108 (90 BASE) MCG/ACT inhaler Inhale 2 puffs into the lungs every 4 (four) hours as needed for wheezing or shortness of breath. 08/31/11 12/03/12  Leslye Peer, MD  beclomethasone (QVAR) 40 MCG/ACT inhaler Inhale 2 puffs into the lungs 2 (two) times daily. 06/17/11 12/03/12  Grayling Congress Lowne, DO  mometasone (NASONEX) 50 MCG/ACT nasal spray Place 2 sprays into the nose daily. 06/17/11 12/03/12  Lelon Perla, DO  peg 3350  powder (MOVIPREP) 100 G SOLR Take 1 kit (100 g total) by mouth once. 11/21/12   Beverley Fiedler, MD    Physical Exam: Filed Vitals:   02/03/13 0940 02/03/13 0944 02/03/13 1126  BP: 130/78  135/85  Pulse: 84  73  Temp:  98.2 F (36.8 C) 98.6 F (37 C)  TempSrc:  Oral Axillary  Resp: 16  20  Height: 6\' 2"  (1.88 m)    Weight: 108.863 kg (240 lb)    SpO2: 95%  97%    Physical Exam  Constitutional: Appears well-developed and well-nourished. No distress.  HENT: Normocephalic. External right and left ear normal. Oropharynx is clear and moist.  Eyes: Conjunctivae and EOM are normal. PERRLA, no scleral icterus.  Neck: Normal ROM. Neck supple. No JVD. No tracheal deviation. No thyromegaly.  CVS: RRR, S1/S2 +, no murmurs, no gallops, no carotid  bruit.  Pulmonary: Effort and breath sounds normal, no stridor, rhonchi, wheezes, rales.  Abdominal: Soft. BS +,  no distension, tenderness in left lower quadrant with voluntary guarding Musculoskeletal: Normal range of motion. No edema and no tenderness.  Lymphadenopathy: No lymphadenopathy noted, cervical, inguinal. Neuro: Alert. Normal reflexes, muscle tone coordination. No cranial nerve deficit. Skin: Skin is warm and dry. No rash noted. Not diaphoretic. No erythema. No pallor.  Psychiatric: Normal mood and affect. Behavior, judgment, thought content normal.   Labs on Admission:  Basic Metabolic Panel:  Recent Labs Lab 02/03/13 1030  NA 133*  K 3.8  CL 95*  CO2 30  GLUCOSE 111*  BUN 9  CREATININE 0.73  CALCIUM 9.9   Liver Function Tests:  Recent Labs Lab 02/03/13 1030  AST 18  ALT 27  ALKPHOS 70  BILITOT 0.6  PROT 7.1  ALBUMIN 3.4*   CBC:  Recent Labs Lab 02/03/13 1030  WBC 10.7*  HGB 14.0  HCT 40.8  MCV 91.9  PLT 217   Cardiac Enzymes: No results found for this basename: CKTOTAL, CKMB, CKMBINDEX, TROPONINI,  in the last 168 hours BNP: No components found with this basename: POCBNP,  CBG: No results found for  this basename: GLUCAP,  in the last 168 hours  Radiological Exams on Admission: Ct Abdomen Pelvis W Contrast  02/03/2013  *RADIOLOGY REPORT*  Clinical Data:  left lower quadrant pain.  Rule out diverticulitis  CT ABDOMEN AND PELVIS WITH CONTRAST  Technique:  Multidetector CT imaging of the abdomen and pelvis was performed following the standard protocol during bolus administration of intravenous contrast.  Contrast: OMNIPAQUE IOHEXOL 300 MG/ML  SOLN  Comparison: 03/03/2011  Findings: Lung bases are clear.  No pericardial or pleural effusion.  Mild diffuse low attenuation throughout the liver noted.  No focal liver abnormalities identified.  The gallbladder appears within normal limits.  No biliary dilatation.  Normal appearance of the pancreas.  The spleen appears normal.  The adrenal glands are both unremarkable.  The kidneys appear normal.  The urinary bladder is unremarkable.  Prostate gland and seminal vesicles are negative.  Normal caliber of the abdominal aorta.  There are no enlarged upper abdominal lymph nodes.  No pelvic or inguinal adenopathy.  The stomach appears normal.  The proximal small bowel loops are mildly increased in caliber measuring up to 3.7 cm.  Mid and distal small bowel are normal in caliber.  The proximal colon is unremarkable.  There is abnormal wall thickening and inflammatory change involving the sigmoid colon compatible with acute diverticulitis.  Perforated diverticula is identified on image 57 of series 2.  The extra colonic gas appears localized to the left lower quadrant within the pericolonic fat.  No focal fluid collections identified.  A small amount of free fluid is noted within the dependent portion of the pelvis.  Review of the visualized bony structures is significant for degenerative disc disease at L5-S1.  There is an anterolisthesis of L5 on S1 which measures 10 mm, image 68/series 6.  IMPRESSION:  1.  Acute sigmoid diverticulitis. 2.  Perforated diverticula is  identified within the left lower quadrant with a focal area of localized extra colonic gas. 3.  No evidence for abscess formation or bowel obstruction. 4.  Hepatic steatosis.   Original Report Authenticated By: Signa Kell, M.D.     EKG: Normal sinus rhythm, no ST/T wave changes  Debbora Presto, MD  [facility] Pager [phone]  If 7PM-7AM, please contact night-coverage [email or web address] Password  [email or web address] 02/03/2013, 12:39 PM

## 2013-02-03 NOTE — ED Notes (Signed)
Attempted to call report.  Nurse unavailable. 

## 2013-02-04 LAB — CBC
HCT: 38.6 % — ABNORMAL LOW (ref 39.0–52.0)
Hemoglobin: 12.9 g/dL — ABNORMAL LOW (ref 13.0–17.0)
MCV: 93.2 fL (ref 78.0–100.0)
RBC: 4.14 MIL/uL — ABNORMAL LOW (ref 4.22–5.81)
RDW: 12.7 % (ref 11.5–15.5)
WBC: 8.3 10*3/uL (ref 4.0–10.5)

## 2013-02-04 LAB — BASIC METABOLIC PANEL
CO2: 30 mEq/L (ref 19–32)
Chloride: 98 mEq/L (ref 96–112)
GFR calc Af Amer: >90 mL/min (ref >90)

## 2013-02-04 NOTE — Progress Notes (Signed)
Patient ID: Michael Pearson, male   DOB: 1961-01-15, 52 y.o.   MRN: [id number]  [facility] PROGRESS NOTE  CHRYSTOPHER STANGL MWN:[id number] DOB: [birth year] DOA: 02/03/2013 PCP: Loreen Freud, DO  HPI:  Pt is 52 yo male who presents to [facility] ED with main concern of progressively worsening left lower quadrant pain that initially started several days prior to admission, dull and occasionally sharp, constant in nature, 7/10 in severity, located in the left lower quadrant, non radiating, associated with single episode of non bloody vomiting and non bloody diarrhea, no specific aggravating or alleviating factors, no similar events in the past. Pt denies fevers, chills, chest pain or shortness of breath.   In ED, CT abdomen indicative of sigmoid diverticulitis, pt started on Ciprofloxacin and Flagyl in ED. [facility] asked to admit for further evaluation.   Assessment and Plan:  Principal Problem:  Acute diverticulitis  - continuing Ciprofloxacin and flagyl day #2, leukocytosis resolved and pt afebrile over 24 hours  - supportive care with analgesia and antiemetics as needed  - may advance diet if pt able to tolerate  Active Problems:  HTN (hypertension)  - reasonable control in patient - continue Metoprolol and Cozaar  Hyperlipidemia  - stable clinical issue   Consultants:  None  Procedures/Studies: Ct Abdomen Pelvis W Contrast  02/03/2013  *RADIOLOGY REPORT*  Clinical Data:  left lower quadrant pain.  Rule out diverticulitis  CT ABDOMEN AND PELVIS WITH CONTRAST  Technique:  Multidetector CT imaging of the abdomen and pelvis was performed following the standard protocol during bolus administration of intravenous contrast.  Contrast: OMNIPAQUE IOHEXOL 300 MG/ML  SOLN  Comparison: 03/03/2011  Findings: Lung bases are clear.  No pericardial or pleural effusion.  Mild diffuse low attenuation throughout the liver noted.  No focal liver abnormalities identified.  The gallbladder appears within normal limits.   No biliary dilatation.  Normal appearance of the pancreas.  The spleen appears normal.  The adrenal glands are both unremarkable.  The kidneys appear normal.  The urinary bladder is unremarkable.  Prostate gland and seminal vesicles are negative.  Normal caliber of the abdominal aorta.  There are no enlarged upper abdominal lymph nodes.  No pelvic or inguinal adenopathy.  The stomach appears normal.  The proximal small bowel loops are mildly increased in caliber measuring up to 3.7 cm.  Mid and distal small bowel are normal in caliber.  The proximal colon is unremarkable.  There is abnormal wall thickening and inflammatory change involving the sigmoid colon compatible with acute diverticulitis.  Perforated diverticula is identified on image 57 of series 2.  The extra colonic gas appears localized to the left lower quadrant within the pericolonic fat.  No focal fluid collections identified.  A small amount of free fluid is noted within the dependent portion of the pelvis.  Review of the visualized bony structures is significant for degenerative disc disease at L5-S1.  There is an anterolisthesis of L5 on S1 which measures 10 mm, image 68/series 6.  IMPRESSION:  1.  Acute sigmoid diverticulitis. 2.  Perforated diverticula is identified within the left lower quadrant with a focal area of localized extra colonic gas. 3.  No evidence for abscess formation or bowel obstruction. 4.  Hepatic steatosis.   Original Report Authenticated By: Signa Kell, M.D.     Antibiotics:  Cipro 04/05 -->  Flagyl 04/05 -->  Code Status: Full Family Communication: Pt at bedside Disposition Plan: Home when medically stable  HPI/Subjective: No events overnight.  Objective: Filed Vitals:   02/03/13 1459 02/03/13 1545 02/03/13 2122 02/04/13 0542  BP: 130/77  124/68 110/74  Pulse: 66  68 60  Temp: 97.7 F (36.5 C)  98.2 F (36.8 C) 99.4 F (37.4 C)  TempSrc: Oral  Oral Oral  Resp: 18  18 18   Height:  6\' 2"  (1.88 m)     Weight:  107.23 kg (236 lb 6.4 oz)    SpO2: 98%  100% 95%    Intake/Output Summary (Last 24 hours) at 02/04/13 1457 Last data filed at 02/03/13 2100  Gross per 24 hour  Intake    885 ml  Output      0 ml  Net    885 ml    Exam:   General:  Pt is alert, follows commands appropriately, not in acute distress  Cardiovascular: Regular rate and rhythm, S1/S2, no murmurs, no rubs, no gallops  Respiratory: Clear to auscultation bilaterally, no wheezing, no crackles, no rhonchi  Abdomen: Soft, non tender, non distended, bowel sounds present, no guarding  Extremities: No edema, pulses DP and PT palpable bilaterally  Neuro: Grossly nonfocal  Data Reviewed: Basic Metabolic Panel:  Recent Labs Lab 02/03/13 1030 02/04/13 0400  NA 133* 133*  K 3.8 3.7  CL 95* 98  CO2 30 30  GLUCOSE 111* 95  BUN 9 7  CREATININE 0.73 0.82  CALCIUM 9.9 9.2   Liver Function Tests:  Recent Labs Lab 02/03/13 1030  AST 18  ALT 27  ALKPHOS 70  BILITOT 0.6  PROT 7.1  ALBUMIN 3.4*   CBC:  Recent Labs Lab 02/03/13 1030 02/04/13 0400  WBC 10.7* 8.3  HGB 14.0 12.9*  HCT 40.8 38.6*  MCV 91.9 93.2  PLT 217 211   Scheduled Meds: . aspirin EC  81 mg Oral Daily  . ciprofloxacin  400 mg Intravenous Q12H  . enoxaparin (LOVENOX) injection  50 mg Subcutaneous Q24H  . famotidine  20 mg Oral BID  . fluticasone  1 spray Each Nare Daily  . fluticasone  1 puff Inhalation BID  . losartan  25 mg Oral Daily  . metoprolol tartrate  12.5 mg Oral BID  . metronidazole  500 mg Intravenous Q8H  . pantoprazole sodium  40 mg Oral Daily   Continuous Infusions:    Debbora Presto, MD  [facility] Pager [phone]  If 7PM-7AM, please contact night-coverage [email or web address] Password [email or web address] 02/04/2013, 2:57 PM   LOS: 1 day

## 2013-02-05 MED ORDER — HYDROCODONE-ACETAMINOPHEN 5-325 MG PO TABS: 1.0000 | ORAL_TABLET | ORAL | Status: DC | PRN

## 2013-02-05 MED ORDER — METRONIDAZOLE 500 MG PO TABS: 500.0000 mg | ORAL_TABLET | Freq: Three times a day (TID) | ORAL | Status: DC

## 2013-02-05 MED ORDER — CIPROFLOXACIN HCL 500 MG PO TABS: 500.0000 mg | ORAL_TABLET | Freq: Two times a day (BID) | ORAL | Status: DC

## 2013-02-05 NOTE — Progress Notes (Signed)
Patient discharged to home, all discharge medications and instructions reviewed and questions answered.  Patient to be assisted to vehicle by wheelchair.  

## 2013-02-05 NOTE — Discharge Summary (Signed)
Physician Discharge Summary  Michael Pearson ZOX:[id number] DOB: [birth year] DOA: 02/03/2013  PCP: Loreen Freud, DO  Admit date: 02/03/2013 Discharge date: 02/05/2013  Recommendations for Outpatient Follow-up:  1. Pt will need to follow up with PCP in 2-3 weeks post discharge 2. Please obtain BMP to evaluate electrolytes and kidney function 3. Please also check CBC to evaluate Hg and Hct levels 4. Please note that pt was discharged on Ciprofloxacin and Flagyl and will need to complete 12 more day of therapy   Discharge Diagnoses: Acute diverticulitis  Principal Problem:   Acute diverticulitis Active Problems:   HTN (hypertension)   Hyperlipidemia    Discharge Condition: Stable  Diet recommendation: Heart healthy diet discussed in details   HPI:  Pt is 52 yo male who presents to [facility] ED with main concern of progressively worsening left lower quadrant pain that initially started several days prior to admission, dull and occasionally sharp, constant in nature, 7/10 in severity, located in the left lower quadrant, non radiating, associated with single episode of non bloody vomiting and non bloody diarrhea, no specific aggravating or alleviating factors, no similar events in the past. Pt denies fevers, chills, chest pain or shortness of breath.   In ED, CT abdomen indicative of sigmoid diverticulitis, pt started on Ciprofloxacin and Flagyl in ED. [facility] asked to admit for further evaluation.   Assessment and Plan:  Principal Problem:  Acute diverticulitis  - continuing Ciprofloxacin and flagyl day #3, leukocytosis resolved and pt afebrile over 24 hours  - supportive care with analgesia and antiemetics as needed provided and pt tolerating well  - diet advanced and pt tolerating well  Active Problems:  HTN (hypertension)  - reasonable control in patient  - continue Metoprolol and Cozaar  Hyperlipidemia  - stable clinical issue  Consultants:  None Procedures/Studies:  Ct Abdomen Pelvis W  Contrast 02/03/2013 *RADIOLOGY REPORT* Clinical Data: left lower quadrant pain. Rule out diverticulitis CT ABDOMEN AND PELVIS WITH CONTRAST Technique: Multidetector CT imaging of the abdomen and pelvis was performed following the standard protocol during bolus administration of intravenous contrast. Contrast: OMNIPAQUE IOHEXOL 300 MG/ML SOLN Comparison: 03/03/2011 Findings: Lung bases are clear. No pericardial or pleural effusion. Mild diffuse low attenuation throughout the liver noted. No focal liver abnormalities identified. The gallbladder appears within normal limits. No biliary dilatation. Normal appearance of the pancreas. The spleen appears normal. The adrenal glands are both unremarkable. The kidneys appear normal. The urinary bladder is unremarkable. Prostate gland and seminal vesicles are negative. Normal caliber of the abdominal aorta. There are no enlarged upper abdominal lymph nodes. No pelvic or inguinal adenopathy. The stomach appears normal. The proximal small bowel loops are mildly increased in caliber measuring up to 3.7 cm. Mid and distal small bowel are normal in caliber. The proximal colon is unremarkable. There is abnormal wall thickening and inflammatory change involving the sigmoid colon compatible with acute diverticulitis. Perforated diverticula is identified on image 57 of series 2. The extra colonic gas appears localized to the left lower quadrant within the pericolonic fat. No focal fluid collections identified. A small amount of free fluid is noted within the dependent portion of the pelvis. Review of the visualized bony structures is significant for degenerative disc disease at L5-S1. There is an anterolisthesis of L5 on S1 which measures 10 mm, image 68/series 6. IMPRESSION: 1. Acute sigmoid diverticulitis. 2. Perforated diverticula is identified within the left lower quadrant with a focal area of localized extra colonic gas. 3. No evidence  for abscess formation or bowel  obstruction. 4. Hepatic steatosis. Original Report Authenticated By: Signa Kell, M.D.   Antibiotics:  Cipro 04/05 -->  Flagyl 04/05 -->  Code Status: Full   Discharge Exam: Filed Vitals:   02/05/13 0651  BP: 109/81  Pulse: 69  Temp: 97.6 F (36.4 C)  Resp: 18   Filed Vitals:   02/04/13 0542 02/04/13 1530 02/04/13 2145 02/05/13 0651  BP: 110/74 107/64 111/63 109/81  Pulse: 60 66 66 69  Temp: 99.4 F (37.4 C) 97.8 F (36.6 C) 97.6 F (36.4 C) 97.6 F (36.4 C)  TempSrc: Oral Oral Oral Oral  Resp: 18 18 18 18   Height:      Weight:      SpO2: 95% 98% 98% 94%    General: Pt is alert, follows commands appropriately, not in acute distress Cardiovascular: Regular rate and rhythm, S1/S2 +, no murmurs, no rubs, no gallops Respiratory: Clear to auscultation bilaterally, no wheezing, no crackles, no rhonchi Abdominal: Soft, non tender, non distended, bowel sounds +, no guarding Extremities: no edema, no cyanosis, pulses palpable bilaterally DP and PT Neuro: Grossly nonfocal  Discharge Instructions  Discharge Orders   Future Orders Complete By Expires     Diet - low sodium heart healthy  As directed     Increase activity slowly  As directed         Medication List    TAKE these medications       acetaminophen 325 MG tablet  Commonly known as:  TYLENOL  Take 650 mg by mouth 2 (two) times daily as needed.     albuterol 108 (90 BASE) MCG/ACT inhaler  Commonly known as:  PROVENTIL HFA;VENTOLIN HFA  Inhale 2 puffs into the lungs every 4 (four) hours as needed for wheezing or shortness of breath.     aspirin 81 MG tablet  Take 81 mg by mouth daily.     atorvastatin 40 MG tablet  Commonly known as:  LIPITOR  Take 1 tablet (40 mg total) by mouth daily.     beclomethasone 40 MCG/ACT inhaler  Commonly known as:  QVAR  Inhale 2 puffs into the lungs 2 (two) times daily.     ciprofloxacin 500 MG tablet  Commonly known as:  CIPRO  Take 1 tablet (500 mg total) by mouth  2 (two) times daily.     esomeprazole 40 MG packet  Commonly known as:  NEXIUM  Take 40 mg by mouth daily before breakfast.     HYDROcodone-acetaminophen 5-325 MG per tablet  Commonly known as:  NORCO/VICODIN  Take 1-2 tablets by mouth every 4 (four) hours as needed.     losartan 25 MG tablet  Commonly known as:  COZAAR  Take 1 tablet (25 mg total) by mouth daily.     metoprolol tartrate 25 MG tablet  Commonly known as:  LOPRESSOR  TAKE 1/2 TABLET BY MOUTH 2 TIMES DAILY     metroNIDAZOLE 500 MG tablet  Commonly known as:  FLAGYL  Take 1 tablet (500 mg total) by mouth 3 (three) times daily.     mometasone 50 MCG/ACT nasal spray  Commonly known as:  NASONEX  Place 2 sprays into the nose daily.     multivitamin tablet  Take 1 tablet by mouth daily.     nitroGLYCERIN 0.4 MG SL tablet  Commonly known as:  NITROSTAT  Place 0.4 mg under the tongue every 5 (five) minutes as needed.     peg 3350 powder 100  G Solr  Commonly known as:  MOVIPREP  Take 1 kit (100 g total) by mouth once.     ranitidine 150 MG tablet  Commonly known as:  ZANTAC  Take 1 tablet (150 mg total) by mouth 2 (two) times daily.           Follow-up Information   Follow up with Loreen Freud, DO In 2 weeks.   Contact information:   [address] [facility] [address] [place] [address] [phone]        The results of significant diagnostics from this hospitalization (including imaging, microbiology, ancillary and laboratory) are listed below for reference.     Microbiology: No results found for this or any previous visit (from the past 240 hour(s)).   Labs: Basic Metabolic Panel:  Recent Labs Lab 02/03/13 1030 02/04/13 0400  NA 133* 133*  K 3.8 3.7  CL 95* 98  CO2 30 30  GLUCOSE 111* 95  BUN 9 7  CREATININE 0.73 0.82  CALCIUM 9.9 9.2   Liver Function Tests:  Recent Labs Lab 02/03/13 1030  AST 18  ALT 27  ALKPHOS 70  BILITOT 0.6  PROT 7.1  ALBUMIN 3.4*    No results found for this basename: LIPASE, AMYLASE,  in the last 168 hours No results found for this basename: AMMONIA,  in the last 168 hours CBC:  Recent Labs Lab 02/03/13 1030 02/04/13 0400  WBC 10.7* 8.3  HGB 14.0 12.9*  HCT 40.8 38.6*  MCV 91.9 93.2  PLT 217 211   SIGNED: Time coordinating discharge: Over 30 minutes  Debbora Presto, MD  [facility] 02/05/2013, 11:47 AM Pager [phone]  If 7PM-7AM, please contact night-coverage [email or web address] Password [email or web address]

## 2013-02-05 NOTE — Care Management (Signed)
CM spoke with patient at bedside concerning medicaid potential payor status. Pt states recently applied for medicare. Approval pending. Pt states currently living at home with brother whom both are full time care takers for their mother. Financial Advisor has spoken with pt concerning payor status. Cm provided patient with [facility] generic drug list for MD to referr to & other community resources. Pt uses [facility] on [address]  PCP: Loreen Freud, DO    Roxy Manns Miche Loughridge,RN,BSN [phone]

## 2013-02-16 ENCOUNTER — Ambulatory Visit (INDEPENDENT_AMBULATORY_CARE_PROVIDER_SITE_OTHER)
Admission: RE | Admit: 2013-02-16 | Discharge: 2013-02-16 | Disposition: A | Discharge status: Home / Self Care | Payer: Worker's Compensation | Source: Ambulatory Visit | Attending: Family Medicine | Admitting: Family Medicine

## 2013-02-16 ENCOUNTER — Ambulatory Visit (INDEPENDENT_AMBULATORY_CARE_PROVIDER_SITE_OTHER): Payer: Self-pay | Admitting: Family Medicine

## 2013-02-16 ENCOUNTER — Encounter: Payer: Self-pay | Admitting: Family Medicine

## 2013-02-16 VITALS — BP 110/80 | HR 77 | Temp 98.0°F | Resp 16 | Wt 237.2 lb

## 2013-02-16 DIAGNOSIS — K5732 Diverticulitis of large intestine without perforation or abscess without bleeding: Secondary | ICD-10-CM

## 2013-02-16 DIAGNOSIS — N390 Urinary tract infection, site not specified: Secondary | ICD-10-CM

## 2013-02-16 LAB — POCT URINALYSIS DIPSTICK
Ketones, UA: NEGATIVE
Protein, UA: NEGATIVE
Spec Grav, UA: >=1.03
pH, UA: 6

## 2013-02-16 LAB — CBC WITH DIFFERENTIAL/PLATELET
Eosinophils Absolute: 0.2 10*3/uL (ref 0.0–0.7)
Eosinophils Relative: 2 % (ref 0.0–5.0)
HCT: 44 % (ref 39.0–52.0)
Lymphs Abs: 1.6 10*3/uL (ref 0.7–4.0)
MCHC: 33.1 g/dL (ref 30.0–36.0)
MCV: 94.8 fl (ref 78.0–100.0)
Monocytes Absolute: 0.8 10*3/uL (ref 0.1–1.0)
Platelets: 242 10*3/uL (ref 150.0–400.0)
WBC: 8.1 10*3/uL (ref 4.5–10.5)

## 2013-02-16 LAB — HEPATIC FUNCTION PANEL
ALT: 29 U/L (ref 0–53)
AST: 22 U/L (ref 0–37)
Albumin: 4 g/dL (ref 3.5–5.2)

## 2013-02-16 LAB — BASIC METABOLIC PANEL
BUN: 12 mg/dL (ref 6–23)
CO2: 24 mEq/L (ref 19–32)
Chloride: 103 mEq/L (ref 96–112)
Glucose, Bld: 98 mg/dL (ref 70–99)
Potassium: 3.8 mEq/L (ref 3.5–5.1)

## 2013-02-16 MED ORDER — CIPROFLOXACIN HCL 500 MG PO TABS: 500.0000 mg | ORAL_TABLET | Freq: Two times a day (BID) | ORAL | Status: DC

## 2013-02-16 MED ORDER — IOHEXOL 300 MG/ML  SOLN
100.0000 mL | Freq: Once | INTRAMUSCULAR | Status: AC | PRN
  Administered 2013-02-16: 100 mL via INTRAVENOUS

## 2013-02-16 MED ORDER — METRONIDAZOLE 500 MG PO TABS: 500.0000 mg | ORAL_TABLET | Freq: Three times a day (TID) | ORAL | Status: DC

## 2013-02-16 NOTE — Progress Notes (Signed)
  Subjective:      Michael Pearson is a 52 y.o. male who presents for evaluation of abdominal pain. Onset was several days ago. Symptoms have been improving but worsened over the last few days again.  Pt d/c from hospital with diverticulitis 4/7.  He has  The pain is described as sharp, and pt states it feels like there is a cinder block in his abd.. Pain is located in the LLQ without radiation.  Aggravating factors: activity.  Alleviating factors: none. Associated symptoms: none. The patient denies constipation, diarrhea, melena, nausea and vomiting.  The patient's history has been marked as reviewed and updated as appropriate.  Review of Systems Pertinent items are noted in HPI.     Objective:    BP 110/80  Pulse 77  Temp(Src) 98 F (36.7 C) (Oral)  Resp 16  Wt 237 lb 4 oz (107.616 kg)  BMI 30.45 kg/m2  SpO2 97% General appearance: alert, cooperative, appears stated age and moderate distress Abdomen: abnormal findings:  mild tenderness in the LLQ and soft, no rebound, guarding    Assessment:    Abdominal pain, likely secondary to diverticulitis .    Plan:    The diagnosis was discussed with the patient and evaluation and treatment plans outlined. See orders for lab and imaging studies. ct abd  Check labs Extend abx

## 2013-02-16 NOTE — Patient Instructions (Addendum)
Diverticulitis °A diverticulum is a small pouch or sac on the colon. Diverticulosis is the presence of these diverticula on the colon. Diverticulitis is the irritation (inflammation) or infection of diverticula. °CAUSES  °The colon and its diverticula contain bacteria. If food particles block the tiny opening to a diverticulum, the bacteria inside can grow and cause an increase in pressure. This leads to infection and inflammation and is called diverticulitis. °SYMPTOMS  °· Abdominal pain and tenderness. Usually, the pain is located on the left side of your abdomen. However, it could be located elsewhere. °· Fever. °· Bloating. °· Feeling sick to your stomach (nausea). °· Throwing up (vomiting). °· Abnormal stools. °DIAGNOSIS  °Your caregiver will take a history and perform a physical exam. Since many things can cause abdominal pain, other tests may be necessary. Tests may include: °· Blood tests. °· Urine tests. °· X-ray of the abdomen. °· CT scan of the abdomen. °Sometimes, surgery is needed to determine if diverticulitis or other conditions are causing your symptoms. °TREATMENT  °Most of the time, you can be treated without surgery. Treatment includes: °· Resting the bowels by only having liquids for a few days. As you improve, you will need to eat a low-fiber diet. °· Intravenous (IV) fluids if you are losing body fluids (dehydrated). °· Antibiotic medicines that treat infections may be given. °· Pain and nausea medicine, if needed. °· Surgery if the inflamed diverticulum has burst. °HOME CARE INSTRUCTIONS  °· Try a clear liquid diet (broth, tea, or water for as long as directed by your caregiver). You may then gradually begin a low-fiber diet as tolerated. A low-fiber diet is a diet with less than 10 grams of fiber. Choose the foods below to reduce fiber in the diet: °· White breads, cereals, rice, and pasta. °· Cooked fruits and vegetables or soft fresh fruits and vegetables without the skin. °· Ground or  well-cooked tender beef, ham, veal, lamb, pork, or poultry. °· Eggs and seafood. °· After your diverticulitis symptoms have improved, your caregiver may put you on a high-fiber diet. A high-fiber diet includes 14 grams of fiber for every 1000 calories consumed. For a standard 2000 calorie diet, you would need 28 grams of fiber. Follow these diet guidelines to help you increase the fiber in your diet. It is important to slowly increase the amount fiber in your diet to avoid gas, constipation, and bloating. °· Choose whole-grain breads, cereals, pasta, and brown rice. °· Choose fresh fruits and vegetables with the skin on. Do not overcook vegetables because the more vegetables are cooked, the more fiber is lost. °· Choose more nuts, seeds, legumes, dried peas, beans, and lentils. °· Look for food products that have greater than 3 grams of fiber per serving on the Nutrition Facts label. °· Take all medicine as directed by your caregiver. °· If your caregiver has given you a follow-up appointment, it is very important that you go. Not going could result in lasting (chronic) or permanent injury, pain, and disability. If there is any problem keeping the appointment, call to reschedule. °SEEK MEDICAL CARE IF:  °· Your pain does not improve. °· You have a hard time advancing your diet beyond clear liquids. °· Your bowel movements do not return to normal. °SEEK IMMEDIATE MEDICAL CARE IF:  °· Your pain becomes worse. °· You have an oral temperature above 102° F (38.9° C), not controlled by medicine. °· You have repeated vomiting. °· You have bloody or black, tarry stools. °· Symptoms   that brought you to your caregiver become worse or are not getting better. °MAKE SURE YOU:  °· Understand these instructions. °· Will watch your condition. °· Will get help right away if you are not doing well or get worse. °Document Released: 07/28/2005 Document Revised: 01/10/2012 Document Reviewed: 11/23/2010 °[employer]® Patient Information  ©2013 [employer]. ° °

## 2013-02-18 LAB — URINE CULTURE: Colony Count: NO GROWTH

## 2013-02-19 ENCOUNTER — Telehealth: Payer: Self-pay | Admitting: Family Medicine

## 2013-02-19 DIAGNOSIS — K5732 Diverticulitis of large intestine without perforation or abscess without bleeding: Secondary | ICD-10-CM

## 2013-02-19 MED ORDER — CIPROFLOXACIN HCL 500 MG PO TABS: 500.0000 mg | ORAL_TABLET | Freq: Two times a day (BID) | ORAL | Status: DC

## 2013-02-19 NOTE — Telephone Encounter (Signed)
Patient states that [facility] is saying that they never received cipro prescription. Instead of [facility], patient would like rx sent to [facility] on [address].

## 2013-02-19 NOTE — Telephone Encounter (Signed)
Pt called back again today in regards to this. Pt states that the pharmacy never received this rx. Called in to [facility] on [address] Again today.

## 2013-02-22 ENCOUNTER — Encounter: Payer: Self-pay | Admitting: Internal Medicine

## 2013-02-26 ENCOUNTER — Encounter: Payer: Self-pay | Admitting: Internal Medicine

## 2013-02-26 ENCOUNTER — Ambulatory Visit (INDEPENDENT_AMBULATORY_CARE_PROVIDER_SITE_OTHER): Payer: Self-pay | Admitting: Internal Medicine

## 2013-02-26 VITALS — BP 104/78 | HR 72 | Ht 74.0 in | Wt 237.8 lb

## 2013-02-26 DIAGNOSIS — Z598 Other problems related to housing and economic circumstances: Secondary | ICD-10-CM

## 2013-02-26 DIAGNOSIS — K651 Peritoneal abscess: Secondary | ICD-10-CM

## 2013-02-26 DIAGNOSIS — R195 Other fecal abnormalities: Secondary | ICD-10-CM

## 2013-02-26 DIAGNOSIS — K5732 Diverticulitis of large intestine without perforation or abscess without bleeding: Secondary | ICD-10-CM

## 2013-02-26 DIAGNOSIS — K5792 Diverticulitis of intestine, part unspecified, without perforation or abscess without bleeding: Secondary | ICD-10-CM

## 2013-02-26 MED ORDER — PEG-KCL-NACL-NASULF-NA ASC-C 100 G PO SOLR: 1.0000 | Freq: Once | ORAL | Status: DC

## 2013-02-26 MED ORDER — CIPROFLOXACIN HCL 500 MG PO TABS: 500.0000 mg | ORAL_TABLET | Freq: Two times a day (BID) | ORAL | Status: DC

## 2013-02-26 MED ORDER — METRONIDAZOLE 500 MG PO TABS: 500.0000 mg | ORAL_TABLET | Freq: Three times a day (TID) | ORAL | Status: DC

## 2013-02-26 NOTE — Progress Notes (Signed)
Patient ID: Michael Pearson, male   DOB: 05-14-1961, 52 y.o.   MRN: [id number] HPI: Michael Pearson is a 52 year old male with a past medical history of CAD, pituitary microadenoma, migraines, hypertension, hyperlipidemia, GERD who was last seen in January 2014 to evaluate heme positive stool history of colitis presenting today after recent diverticulitis requiring hospitalization. Colonoscopy was recommended, but he chose to defer this to April of this year for financial reasons.  Before this test was ever scheduled, he developed complicated diverticulitis requiring hospitalization in early April of 2014. He reportedly had a microperforation which did not require surgical or radiologic intervention, and he was placed on IV antibiotics for several days and discharged on oral antibiotics. He has several days left of oral ciprofloxacin but reports he has completed metronidazole. When he was admitted to the hospital he reports he appear left lower quadrant abdominal pain, which has her medically improved. He reports he is now about 90% better, but still has mild intermittent left lower quadrant discomfort. His bowel habits have remained regular which for him is 3-4 times daily. He has not seen any overt bleeding. He denies fevers. He continues to report trouble financially but he has applied for Medicaid.  EGD, date 03/05/2011, Dr. Jeani Hawking -- mild hiatal hernia, mild duodenitis.  Flexible sigmoidoscopy, date 03/05/2011, Dr. Jeani Hawking -- colitis with scattered ulceration extending from rectum to 30 cm. Medium internal hemorrhoids. Biopsy. Pathology = erosion with active inflammation. No granulomas. Differential infectious versus ischemic.    Past Medical History  Diagnosis Date  . Migraines     Has been evaluated multiple times in past for chronic headache in which pt has had occasional nose bleeds and bloodshot eyes. This lasted for 4-5 months. CT of the head was negative in May 2012.  Michael Pearson     MRI in  2008 demonstrating 4.6 mm area of pituitary gland   . Duodenitis     May 2012 with heme positive stools at this time. Endorses only rare streaking of blood now.  . Colitis   . CAD (coronary artery disease)     NSTEMI 7/12:  Cardiac cath on 7/17: pLAD occluded, Dx 30-40%, pRCA 30-40%, mRCA 30-40%.  Proximal LAD was treated with a BMS.  Echo 7/19:  EF 60-65%, mild LVH  . RBBB (right bundle branch block)     Noted on EKG in 2008  . Kidney stones   . Hyperlipidemia   . Hypertension   . Myocardial infarction 05/17/2011  . Allergy   . Asthma   . Hiatal hernia   . Duodenitis   . Ulcer   . Hx of hemorrhoids     Past Surgical History  Procedure Laterality Date  . Hernia repair      In 20's  . Tooth extraction    . Hammer toe surgery    . Egd  03/05/2011    Dr. Elnoria Howard  . Flexible sigmoidoscopy  03/05/2011    Dr. Elnoria Howard    Current Outpatient Prescriptions  Medication Sig Dispense Refill  . acetaminophen (TYLENOL) 325 MG tablet Take 650 mg by mouth 2 (two) times daily as needed.        Marland Kitchen aspirin 81 MG tablet Take 81 mg by mouth daily.        Marland Kitchen atorvastatin (LIPITOR) 40 MG tablet Take 1 tablet (40 mg total) by mouth daily.  30 tablet  9  . ciprofloxacin (CIPRO) 500 MG tablet Take 1 tablet (500 mg total) by mouth 2 (  two) times daily.  20 tablet  0  . esomeprazole (NEXIUM) 40 MG packet Take 40 mg by mouth daily before breakfast.  30 each  12  . HYDROcodone-acetaminophen (NORCO/VICODIN) 5-325 MG per tablet Take 1-2 tablets by mouth every 4 (four) hours as needed.  65 tablet  0  . losartan (COZAAR) 25 MG tablet Take 1 tablet (25 mg total) by mouth daily.  90 tablet  3  . metoprolol tartrate (LOPRESSOR) 25 MG tablet TAKE 1/2 TABLET BY MOUTH 2 TIMES DAILY  30 tablet  9  . metroNIDAZOLE (FLAGYL) 500 MG tablet Take 1 tablet (500 mg total) by mouth 3 (three) times daily.  30 tablet  0  . Multiple Vitamin (MULTIVITAMIN) tablet Take 1 tablet by mouth daily.        . nitroGLYCERIN (NITROSTAT) 0.4 MG SL  tablet Place 0.4 mg under the tongue every 5 (five) minutes as needed.        . ranitidine (ZANTAC) 150 MG tablet Take 1 tablet (150 mg total) by mouth 2 (two) times daily.  60 tablet  5  . albuterol (PROVENTIL HFA;VENTOLIN HFA) 108 (90 BASE) MCG/ACT inhaler Inhale 2 puffs into the lungs every 4 (four) hours as needed for wheezing or shortness of breath.  1 Inhaler  11  . beclomethasone (QVAR) 40 MCG/ACT inhaler Inhale 2 puffs into the lungs 2 (two) times daily.  1 Inhaler  12  . mometasone (NASONEX) 50 MCG/ACT nasal spray Place 2 sprays into the nose daily.  17 g  2  . peg 3350 powder (MOVIPREP) 100 G SOLR Take 1 kit (100 g total) by mouth once.  1 kit  0   No current facility-administered medications for this visit.    No Known Allergies  Family History  Problem Relation Age of Onset  . Stroke Mother   . Heart attack Father 84    MI  . Skin cancer Father   . Colon polyps Maternal Aunt   . Obesity Brother   . Coronary artery disease Brother     Possible, pt not sure of specifics  . Hypertension Brother   . Colon polyps Paternal Grandmother   . Colon cancer Neg Hx     History  Substance Use Topics  . Smoking status: Never Smoker   . Smokeless tobacco: Never Used  . Alcohol Use: No    ROS: As per history of present illness, otherwise negative  BP 104/78  Pulse 72  Ht 6\' 2"  (1.88 m)  Wt 237 lb 12.8 oz (107.865 kg)  BMI 30.52 kg/m2 Constitutional: Well-developed and well-nourished. No distress. HEENT: Normocephalic and atraumatic. Oropharynx is clear and moist. No oropharyngeal exudate. Conjunctivae are normal.  No scleral icterus. Cardiovascular: Normal rate, regular rhythm and intact distal pulses. No M/R/G Pulmonary/chest: Effort normal and breath sounds normal. No wheezing, rales or rhonchi. Abdominal: Soft, mild left lower quadrant tenderness without rebound or guarding, nondistended. Bowel sounds active throughout. No hepatosplenomegaly. Extremities: no clubbing,  cyanosis, or edema. Neurological: Alert and oriented to person place and time. Skin: Skin is warm and dry. No rashes noted. Psychiatric: Normal mood and affect. Behavior is normal.  RELEVANT LABS AND IMAGING: CBC    Component Value Date/Time   WBC 8.1 02/16/2013 1041   RBC 4.65 02/16/2013 1041   HGB 14.6 02/16/2013 1041   HCT 44.0 02/16/2013 1041   PLT 242.0 02/16/2013 1041   MCV 94.8 02/16/2013 1041   MCH 31.2 02/04/2013 0400   MCHC 33.1 02/16/2013 1041  RDW 13.9 02/16/2013 1041   LYMPHSABS 1.6 02/16/2013 1041   MONOABS 0.8 02/16/2013 1041   EOSABS 0.2 02/16/2013 1041   BASOSABS 0.1 02/16/2013 1041    CMP     Component Value Date/Time   NA 137 02/16/2013 1041   K 3.8 02/16/2013 1041   CL 103 02/16/2013 1041   CO2 24 02/16/2013 1041   GLUCOSE 98 02/16/2013 1041   BUN 12 02/16/2013 1041   CREATININE 0.9 02/16/2013 1041   CALCIUM 9.4 02/16/2013 1041   PROT 7.3 02/16/2013 1041   ALBUMIN 4.0 02/16/2013 1041   AST 22 02/16/2013 1041   ALT 29 02/16/2013 1041   ALKPHOS 51 02/16/2013 1041   BILITOT 0.6 02/16/2013 1041   GFRNONAA >90 02/04/2013 0400   GFRAA >90 02/04/2013 0400   CT ABDOMEN AND PELVIS WITH CONTRAST --  02/03/2013   Technique:  Multidetector CT imaging of the abdomen and pelvis was performed following the standard protocol during bolus administration of intravenous contrast.   Contrast: OMNIPAQUE IOHEXOL 300 MG/ML  SOLN   Comparison: 03/03/2011   Findings: Lung bases are clear.  No pericardial or pleural effusion.   Mild diffuse low attenuation throughout the liver noted.  No focal liver abnormalities identified.  The gallbladder appears within normal limits.  No biliary dilatation.  Normal appearance of the pancreas.  The spleen appears normal.   The adrenal glands are both unremarkable.  The kidneys appear normal.  The urinary bladder is unremarkable.  Prostate gland and seminal vesicles are negative.   Normal caliber of the abdominal aorta.  There are no enlarged  upper abdominal lymph nodes.  No pelvic or inguinal adenopathy.   The stomach appears normal.  The proximal small bowel loops are mildly increased in caliber measuring up to 3.7 cm.  Mid and distal small bowel are normal in caliber.  The proximal colon is unremarkable.  There is abnormal wall thickening and inflammatory change involving the sigmoid colon compatible with acute diverticulitis.  Perforated diverticula is identified on image 57 of series 2.  The extra colonic gas appears localized to the left lower quadrant within the pericolonic fat.  No focal fluid collections identified.  A small amount of free fluid is noted within the dependent portion of the pelvis.   Review of the visualized bony structures is significant for degenerative disc disease at L5-S1.  There is an anterolisthesis of L5 on S1 which measures 10 mm, image 68/series 6.   IMPRESSION:   1.  Acute sigmoid diverticulitis. 2.  Perforated diverticula is identified within the left lower quadrant with a focal area of localized extra colonic gas. 3.  No evidence for abscess formation or bowel obstruction. 4.  Hepatic steatosis.   CT ABDOMEN AND PELVIS WITH CONTRAST -- 02/19/2013   Technique:  Multidetector CT imaging of the abdomen and pelvis was performed following the standard protocol during bolus administration of intravenous contrast.   Contrast: OMNIPAQUE IOHEXOL 300 MG/ML  SOLN 02/03/2013   Comparison: 02/03/2013   Findings: Proximal sigmoid diverticulitis shows improvement since previous study.  There is a tiny diverticular abscess measuring 1.6 cm, however previously seen extraluminal gas collections are no longer visualized.   No other inflammatory process or abnormal fluid collections are seen.  Appendix is normal appearance.  No evidence of bowel obstruction.   The liver, gallbladder, pancreas, spleen, adrenal glands, and kidneys are normal appearance.  No evidence of hydronephrosis.   No soft tissue masses or lymphadenopathy identified.   IMPRESSION:  Improving proximal sigmoid diverticulitis since prior study. Decreased size of tiny diverticular abscess, now measuring 1.6 cm.    ASSESSMENT/PLAN: 52 year old male with a past medical history of CAD, pituitary microadenoma, migraines, hypertension, hyperlipidemia, GERD who was last seen in January 2014 to evaluate heme positive stool history of colitis presenting today after recent diverticulitis requiring hospitalization.  1.  Complicated diverticulitis/history of heme positive stool -- the patient is recovering from acute complicated diverticulitis and recent imaging (second CT this month) showed a small residual 1.6 cm abscess along the sigmoid colon. Overall the inflammation had improved. I would like to extend his course of antibiotics given the small but persistent abscess. I will prescribe ciprofloxacin 500 mg twice daily and metronidazole 500 mg 3 times daily for an additional 10 days. This recent episode, along with his history of heme positive stool, only reinforces my recommendation for colonoscopy. Colonoscopy was discussed again today, and while he is to concern about affording it, is agreeable to proceed. We will schedule his colonoscopy today in roughly 2-3 weeks to allow further time for complete resolution of diverticulitis. Once his colonoscopy is completed, I feel that he should be referred to [facility] surgery given his history of complicated diverticulitis. He is given paperwork today to seek medical assistance given his limited resources through [facility]  Further recommendations after colonoscopy

## 2013-02-26 NOTE — Patient Instructions (Addendum)
You have been scheduled for a colonoscopy with propofol. Please follow written instructions given to you at your visit today.  Please pick up your prep kit at the pharmacy within the next 1-3 days. If you use inhalers (even only as needed), please bring them with you on the day of your procedure. Your physician has requested that you go to www.startemmi.com and enter the access code given to you at your visit today. This web site gives a general overview about your procedure. However, you should still follow specific instructions given to you by our office regarding your preparation for the procedure.  We have sent the following medications to your pharmacy for you to pick up at your convenience: Continue taking your antibiotics for 10 more days, Moviprep  .You will be contacted about your referral to [facility]

## 2013-02-28 ENCOUNTER — Telehealth: Payer: Self-pay | Admitting: Gastroenterology

## 2013-02-28 ENCOUNTER — Telehealth: Payer: Self-pay | Admitting: Internal Medicine

## 2013-02-28 DIAGNOSIS — K5732 Diverticulitis of large intestine without perforation or abscess without bleeding: Secondary | ICD-10-CM

## 2013-02-28 MED ORDER — CIPROFLOXACIN HCL 500 MG PO TABS: 500.0000 mg | ORAL_TABLET | Freq: Two times a day (BID) | ORAL | Status: DC

## 2013-02-28 MED ORDER — METRONIDAZOLE 500 MG PO TABS: 500.0000 mg | ORAL_TABLET | Freq: Three times a day (TID) | ORAL | Status: DC

## 2013-02-28 NOTE — Telephone Encounter (Signed)
Referred pt to [facility] for consult regarding history of complicated diverticulitis.

## 2013-02-28 NOTE — Telephone Encounter (Signed)
Spoke to pt. He said she he went to the pharmacy all that was there was the moviprep, I told him I will resend in the cipro and flagyl.

## 2013-03-01 ENCOUNTER — Other Ambulatory Visit: Payer: Self-pay | Admitting: Gastroenterology

## 2013-03-09 ENCOUNTER — Ambulatory Visit (INDEPENDENT_AMBULATORY_CARE_PROVIDER_SITE_OTHER): Payer: Self-pay | Admitting: Surgery

## 2013-03-15 ENCOUNTER — Telehealth: Payer: Self-pay | Admitting: Family Medicine

## 2013-03-15 NOTE — Telephone Encounter (Signed)
Received a copy of Disability Determination Service examination report from Dr. Loraine Leriche L. Fields on 03/15/2013  asw  4 pages sent to Dr. Laury Axon by [facility] on 03/14/2013  asw

## 2013-03-23 ENCOUNTER — Other Ambulatory Visit: Payer: Self-pay | Admitting: Cardiovascular Disease

## 2013-03-27 ENCOUNTER — Telehealth: Payer: Self-pay | Admitting: Internal Medicine

## 2013-03-27 ENCOUNTER — Other Ambulatory Visit: Payer: Self-pay | Admitting: Cardiovascular Disease

## 2013-03-27 NOTE — Telephone Encounter (Signed)
no

## 2013-03-28 ENCOUNTER — Encounter: Payer: Self-pay | Admitting: Internal Medicine

## 2013-03-30 ENCOUNTER — Ambulatory Visit (INDEPENDENT_AMBULATORY_CARE_PROVIDER_SITE_OTHER): Payer: Self-pay | Admitting: Surgery

## 2013-03-30 ENCOUNTER — Encounter (INDEPENDENT_AMBULATORY_CARE_PROVIDER_SITE_OTHER): Payer: Self-pay | Admitting: Surgery

## 2013-03-30 VITALS — BP 132/76 | HR 62 | Temp 97.5°F | Resp 16 | Ht 74.0 in | Wt 239.6 lb

## 2013-03-30 DIAGNOSIS — Z8719 Personal history of other diseases of the digestive system: Secondary | ICD-10-CM

## 2013-03-30 NOTE — Patient Instructions (Signed)
Will send for cardiology and pulmonary assessment and have you return after colonoscopy to further discuss surgical treatment of diverticulitis.   Open Colon Resection Colon resection is surgery to take out part or all of the large intestine (colon). It is also called a colectomy.  LET YOUR CAREGIVER KNOW ABOUT:  Any allergies.  All medicines you are taking, including:  Herbs, eyedrops, over-the-counter medicines and creams.  Blood thinners (anticoagulants), aspirin, or other drugs that could affect blood clotting.  Use of steroids (by mouth or as creams).  Previous problems with anesthetics, including local anesthetics.  Possibility of pregnancy, if this applies.  Any history of blood clots.  Any history of bleeding or other blood problems.  Previous surgery.  Smoking history.  Any recent symptoms of colds or infections.  Other health problems. RISKS AND COMPLICATIONS  There are always risks for surgery with medicine that makes you sleep (general anesthetic). They include breathing and heart problems. However, this risk is low for people who have no other health problems. Other complications from colon resection may include:  An infection developing in the area where the surgery was done.  Problems with the incisions including:  Bleeding from an incision.  The wound reopening.  Tissues from inside the abdomen bulging through the incision (hernia).  Bleeding inside the abdomen.  Reopening of the colon where it was stitched or stapled together. This is a serious complication. Another procedure may be needed to fix the problem.  Damage to other organs in the abdomen.  A blood clot forming in a vein and traveling to the lungs.  Future blockage of the colon. BEFORE THE PROCEDURE  A medical evaluation will be done. This may include:  A physical exam.  Blood tests.  A test to check the heart's rhythm (electrocardiogram).  X-rays, such as magnetic resonance  imaging (MRI). This can take pictures of the colon. An MRI uses a magnet, radio waves, and a computer to create a picture of your colon.  Talking with the person who will be in charge of the medicine during the procedure. An open colon resection requires general anesthesia. Ask what you can expect.  Two weeks before the surgery, stop using aspirin and nonsteroidal anti-inflammatory drugs (NSAIDs) for pain relief. This includes prescription drugs and over-the-counter drugs. Also stop taking vitamin E.  If you take blood thinners, ask your caregiver when you should stop taking them.  Do not eat or drink anything for 8 to 12 hours before the surgery. Ask your caregiver if it is okay to take any needed medicines with a sip of water.  Ask your caregiver if you need to arrive early before the procedure.  On the day of your surgery, your caregiver will need to know the last time you had anything to eat or drink. This includes water, gum, and candy.  Make arrangements in advance for someone to drive you home. PROCEDURE Colon resection can take 1 to 4 hours.  Small monitors will be put on your body. They are used to check your heart, blood pressure, and oxygen level.  You will be given an intravenous line (IV). A needle will be inserted in your arm. Medicine will be able to flow directly into your body through this needle.  You might be given a medicine to help you relax (sedative).  You will be given a general anesthetic.  A tube may be put in through your nose. It is called a nasogastric tube. It is used to remove stomach  juices after surgery until the intestines start working again.  Once you are asleep, the surgeon will make an incision in the abdomen about 6 to 12 inches long.  Clamps are put on both ends of the diseased part of the colon.  The part of the intestine between the clamps is removed.  If possible, the ends of the healthy colon that remain will be stitched or stapled  together.  Sometimes, a colostomy is needed. For a colostomy:  An opening (stoma) to the outside is made through the abdomen.  The end of the colon is brought through the opening. It is stitched to the skin.  A bag is attached to the opening. Waste will drain into this bag. The bag is removable.  The colostomy can be temporary or permanent. Ask your surgeon what to expect.  The incision from the colon resection will be closed with stitches or staples. AFTER THE PROCEDURE  You will stay in a recovery area until the anesthesia has worn off. Your blood pressure and pulse will be checked every so often. Then you will be taken to a hospital room.  You will continue to get fluids through the IV for awhile. The IV will be taken out when the colon starts working again.  You will gradually go back to a normal diet.  Some pain is normal after a colon resection. Ask for pain medicine if the pain becomes too much.  You will be urged to get up and start walking after 1 or 2 days, at the most.  If you had a colostomy, your caregiver will explain how it works and what you will need to do.  Most people spend 3 to 7 days in the hospital after this surgery. Ask your caregiver what to expect. Document Released: 08/15/2009 Document Revised: 01/10/2012 Document Reviewed: 03/05/2011 [facility] Patient Information 2014 [place], [place].

## 2013-03-30 NOTE — Progress Notes (Signed)
Patient ID: Michael Pearson, male   DOB: Jun 01, 1961, 52 y.o.   MRN: [id number]  Chief Complaint  Patient presents with  . New Evaluation    eval diverticulitis    HPI Michael Pearson is a 52 y.o. male.  Patient sent at the request of Dr.Pyrtle 4 history of diverticulitis. He has had 2 attacks of left for quadrant pain last in April 2014 required admission to the hospital for treatment of sigmoid diverticulitis diagnosed by CT scan. In a small abscess that was not percutaneously drained and he improved on antibiotics. He thinks he has had 2 attacks in the past. Currently, denies abdominal pain, nausea, vomiting, diarrhea. He has a history of a previous colonoscopy in 2012 showing ulceration from the rectum up to the sigmoid colon it was felt to be inflammatory versus ischemic. HPI  Past Medical History  Diagnosis Date  . Migraines     Has been evaluated multiple times in past for chronic headache in which pt has had occasional nose bleeds and bloodshot eyes. This lasted for 4-5 months. CT of the head was negative in May 2012.  [name]     MRI in 2008 demonstrating 4.6 mm area of pituitary gland   . Duodenitis     May 2012 with heme positive stools at this time. Endorses only rare streaking of blood now.  . Colitis   . CAD (coronary artery disease)     NSTEMI 7/12:  Cardiac cath on 7/17: pLAD occluded, Dx 30-40%, pRCA 30-40%, mRCA 30-40%.  Proximal LAD was treated with a BMS.  Echo 7/19:  EF 60-65%, mild LVH  . RBBB (right bundle branch block)     Noted on EKG in 2008  . Kidney stones   . Hyperlipidemia   . Hypertension   . Myocardial infarction 05/17/2011  . Allergy   . Asthma   . Hiatal hernia   . Duodenitis   . Ulcer   . Hx of hemorrhoids   . GERD (gastroesophageal reflux disease)     Past Surgical History  Procedure Laterality Date  . Hernia repair      In 20's  . Tooth extraction    . Hammer toe surgery    . Egd  03/05/2011    Dr. Elnoria Howard  . Flexible sigmoidoscopy  03/05/2011    Dr. Elnoria Howard    Family History  Problem Relation Age of Onset  . Stroke Mother   . Heart attack Father 47    MI  . Skin cancer Father   . Colon polyps Maternal Aunt   . Obesity Brother   . Coronary artery disease Brother     Possible, pt not sure of specifics  . Hypertension Brother   . Colon polyps Paternal Grandmother   . Colon cancer Neg Hx     Social History History  Substance Use Topics  . Smoking status: Never Smoker   . Smokeless tobacco: Never Used  . Alcohol Use: No    No Known Allergies  Current Outpatient Prescriptions  Medication Sig Dispense Refill  . aspirin 81 MG tablet Take 81 mg by mouth daily.        Marland Kitchen atorvastatin (LIPITOR) 40 MG tablet Take 1 tablet (40 mg total) by mouth daily.  30 tablet  9  . esomeprazole (NEXIUM) 40 MG packet Take 40 mg by mouth daily before breakfast.  30 each  12  . losartan (COZAAR) 25 MG tablet TAKE 1 TABLET (25 MG TOTAL) BY MOUTH DAILY.  30 tablet  2  . metoprolol tartrate (LOPRESSOR) 25 MG tablet TAKE 1/2 TABLET BY MOUTH 2 TIMES DAILY  30 tablet  9  . Multiple Vitamin (MULTIVITAMIN) tablet Take 1 tablet by mouth daily.        . nitroGLYCERIN (NITROSTAT) 0.4 MG SL tablet Place 0.4 mg under the tongue every 5 (five) minutes as needed.        . peg 3350 powder (MOVIPREP) 100 G SOLR Take 1 kit (100 g total) by mouth once.  1 kit  0  . ranitidine (ZANTAC) 150 MG tablet Take 1 tablet (150 mg total) by mouth 2 (two) times daily.  60 tablet  5  . acetaminophen (TYLENOL) 325 MG tablet Take 650 mg by mouth 2 (two) times daily as needed.        Marland Kitchen albuterol (PROVENTIL HFA;VENTOLIN HFA) 108 (90 BASE) MCG/ACT inhaler Inhale 2 puffs into the lungs every 4 (four) hours as needed for wheezing or shortness of breath.  1 Inhaler  11  . beclomethasone (QVAR) 40 MCG/ACT inhaler Inhale 2 puffs into the lungs 2 (two) times daily.  1 Inhaler  12  . mometasone (NASONEX) 50 MCG/ACT nasal spray Place 2 sprays into the nose daily.  17 g  2   No current  facility-administered medications for this visit.    Review of Systems Review of Systems  Constitutional: Negative for chills.  HENT: Positive for postnasal drip.   Eyes: Negative.   Respiratory: Positive for shortness of breath.   Cardiovascular: Negative for chest pain and leg swelling.  Gastrointestinal: Positive for abdominal pain and abdominal distention.  Endocrine:       Pituitary problems  Genitourinary: Negative.   Allergic/Immunologic: Positive for environmental allergies.  Neurological: Positive for headaches.  Hematological: Negative.     Blood pressure 132/76, pulse 62, temperature 97.5 F (36.4 C), temperature source Temporal, resp. rate 16, height 6\' 2"  (1.88 m), weight 239 lb 9.6 oz (108.682 kg).  Physical Exam Physical Exam  Constitutional: He is oriented to person, place, and time. He appears well-developed and well-nourished.  HENT:  Head: Normocephalic and atraumatic.  Eyes: EOM are normal. Pupils are equal, round, and reactive to light.  Neck: Normal range of motion. Neck supple.  Cardiovascular: Normal rate and regular rhythm.   No murmur heard. Pulmonary/Chest: Effort normal and breath sounds normal. He has no wheezes.  Abdominal: Soft. He exhibits no distension and no mass. There is no tenderness. There is no rebound.  Musculoskeletal: Normal range of motion.  Neurological: He is alert and oriented to person, place, and time.  Skin: Skin is warm and dry.  Psychiatric: He has a normal mood and affect. His behavior is normal. Judgment and thought content normal.    Data Reviewed Clinical Data: Abdominal pain. Nausea and vomiting. Sigmoid  diverticulitis.  CT ABDOMEN AND PELVIS WITH CONTRAST  Technique: Multidetector CT imaging of the abdomen and pelvis was  performed following the standard protocol during bolus  administration of intravenous contrast.  Contrast: OMNIPAQUE IOHEXOL 300 MG/ML SOLN 02/03/2013  Comparison: 02/03/2013  Findings:  Proximal sigmoid diverticulitis shows improvement since  previous study. There is a tiny diverticular abscess measuring 1.6  cm, however previously seen extraluminal gas collections are no  longer visualized.  No other inflammatory process or abnormal fluid collections are  seen. Appendix is normal appearance. No evidence of bowel  obstruction.  The liver, gallbladder, pancreas, spleen, adrenal glands, and  kidneys are normal appearance. No evidence of hydronephrosis.  No  soft tissue masses or lymphadenopathy identified.  IMPRESSION:  Improving proximal sigmoid diverticulitis since prior study.  Decreased size of tiny diverticular abscess, now measuring 1.6 cm.  Original Report Authenticated By: Myles Rosenthal, M.D.    Assessment    Diverticulitis history of 2 attacks this year, to buy small abscess currently asymptomatic Patient Active Problem List   Diagnosis Date Noted  . History of diverticulitis of colon 03/30/2013  . Acute diverticulitis 02/03/2013  . HTN (hypertension) 02/03/2013  . Chronic allergic rhinitis 11/03/2012  . Heme positive stool 11/03/2012  . Foot injury 05/15/2012  . Cough 07/22/2011  . Migraines 06/17/2011  . Mild intermittent asthma 06/17/2011  . Non-ST elevation myocardial infarction (NSTEMI) 06/07/2011  . Coronary atherosclerosis of native coronary artery 06/07/2011  . Hyperlipidemia 06/07/2011      Plan    Await colonoscopy next week. Will need cardiac and pulmonary input about risk of sigmoid colectomy. Return to clinic after the above is done to further discuss surgical treatment of diverticular disease, risks, benefits and other possibilities of non-operative management.       Michael Pearson A. 03/30/2013, 9:37 AM

## 2013-04-05 ENCOUNTER — Ambulatory Visit (AMBULATORY_SURGERY_CENTER): Payer: Self-pay | Admitting: Internal Medicine

## 2013-04-05 ENCOUNTER — Encounter: Payer: Self-pay | Admitting: Internal Medicine

## 2013-04-05 VITALS — BP 133/93 | HR 70 | Temp 98.7°F | Resp 16 | Ht 74.0 in | Wt 237.0 lb

## 2013-04-05 DIAGNOSIS — Z8719 Personal history of other diseases of the digestive system: Secondary | ICD-10-CM

## 2013-04-05 DIAGNOSIS — K633 Ulcer of intestine: Secondary | ICD-10-CM

## 2013-04-05 DIAGNOSIS — R195 Other fecal abnormalities: Secondary | ICD-10-CM

## 2013-04-05 DIAGNOSIS — K5732 Diverticulitis of large intestine without perforation or abscess without bleeding: Secondary | ICD-10-CM

## 2013-04-05 MED ORDER — SODIUM CHLORIDE 0.9 % IV SOLN: 500.0000 mL | INTRAVENOUS | Status: DC

## 2013-04-05 NOTE — Progress Notes (Signed)
Called to room to assist during endoscopic procedure.  Patient ID and intended procedure confirmed with present staff. Received instructions for my participation in the procedure from the performing physician.  

## 2013-04-05 NOTE — Progress Notes (Signed)
Patient did not have preoperative order for IV antibiotic SSI prophylaxis. (G8918)  Patient did not experience any of the following events: a burn prior to discharge; a fall within the facility; wrong site/side/patient/procedure/implant event; or a hospital transfer or hospital admission upon discharge from the facility. (G8907)  

## 2013-04-05 NOTE — Op Note (Signed)
[facility] [address] [place], [address]   COLONOSCOPY PROCEDURE REPORT  PATIENT: Michael Pearson, Michael Pearson  MR#: [id number] BIRTHDATE: 09/11/1961 , 52  yrs. old GENDER: Male ENDOSCOPIST: Beverley Fiedler, MD PROCEDURE DATE:  04/05/2013 PROCEDURE:   Colonoscopy with biopsy ASA CLASS:   Class III INDICATIONS:heme-positive stool and follow up for previously diagnosed complicated diverticulitis. MEDICATIONS: MAC sedation, administered by CRNA and propofol (Diprivan) 200mg  IV  DESCRIPTION OF PROCEDURE:   After the risks benefits and alternatives of the procedure were thoroughly explained, informed consent was obtained.  A digital rectal exam revealed no rectal mass.   The LB [id number]  endoscope was introduced through the anus and advanced to the cecum, which was identified by both the appendix and ileocecal valve. No adverse events experienced. The quality of the prep was Moviprep fair  The instrument was then slowly withdrawn as the colon was fully examined.      COLON FINDINGS: There was moderate diverticulosis noted in the sigmoid colon with associated muscular hypertrophy and inflammatory changes characterized by erythema and petechia in patchy distribution.  This involved an approximate 10 cm segment in the sigmoid. Cold forceps were used to obtain biopsies throughout this erythematous segment.  The colon mucosa was otherwise normal. Retroflexed views revealed internal hemorrhoids. The time to cecum=4 minutes 58 seconds.  Withdrawal time=14 minutes 40 seconds. The scope was withdrawn and the procedure completed. COMPLICATIONS: There were no complications.  ENDOSCOPIC IMPRESSION: 1.   There was moderate diverticulosis noted in the sigmoid colon with associated erythema; biopsies obtained 2.   The colon mucosa was otherwise normal in the cecum, ascending colon, transverse colon, descending colon and rectum  RECOMMENDATIONS: 1.  Await pathology results 2.   High fiber diet 3.  Referral to [facility] given history of complicated and recurrent sigmoid diverticulitis 4.  Repeat colonoscopy in 10 years.   eSigned:  Beverley Fiedler, MD 04/05/2013 9:33 AM   cc: The Patient

## 2013-04-05 NOTE — Patient Instructions (Addendum)

## 2013-04-06 ENCOUNTER — Telehealth: Payer: Self-pay | Admitting: *Deleted

## 2013-04-06 NOTE — Telephone Encounter (Signed)
  Follow up Call-  Call back number 04/05/2013  Post procedure Call Back phone  # [phone] cell  Permission to leave phone message No     Patient questions:  Do you have a fever, pain , or abdominal swelling? no Pain Score  0 *  Have you tolerated food without any problems? yes  Have you been able to return to your normal activities? yes  Do you have any questions about your discharge instructions: Diet   no Medications  no Follow up visit  no  Do you have questions or concerns about your Care? no  Actions: * If pain score is 4 or above: No action needed, pain <4.

## 2013-04-06 NOTE — Telephone Encounter (Signed)
Dr Rhea Belton recommended pt see a surgeon for complicated diverticulitis. Pt states he has seen Dr Luisa Hart and will f/u again on 04/30/13 after a cardiac eval.

## 2013-04-11 ENCOUNTER — Encounter: Payer: Self-pay | Admitting: Internal Medicine

## 2013-04-17 ENCOUNTER — Encounter: Payer: Self-pay | Admitting: Physician Assistant

## 2013-04-17 ENCOUNTER — Encounter: Payer: Self-pay | Admitting: Emergency Medicine

## 2013-04-17 ENCOUNTER — Ambulatory Visit (INDEPENDENT_AMBULATORY_CARE_PROVIDER_SITE_OTHER): Payer: Self-pay | Admitting: Emergency Medicine

## 2013-04-17 ENCOUNTER — Ambulatory Visit (INDEPENDENT_AMBULATORY_CARE_PROVIDER_SITE_OTHER): Payer: Self-pay | Admitting: Physician Assistant

## 2013-04-17 VITALS — BP 110/68 | HR 62 | Temp 97.7°F | Ht 76.0 in | Wt 241.6 lb

## 2013-04-17 VITALS — BP 118/78 | HR 45 | Ht 74.0 in | Wt 239.0 lb

## 2013-04-17 DIAGNOSIS — I498 Other specified cardiac arrhythmias: Secondary | ICD-10-CM

## 2013-04-17 DIAGNOSIS — R001 Bradycardia, unspecified: Secondary | ICD-10-CM

## 2013-04-17 DIAGNOSIS — J4520 Mild intermittent asthma, uncomplicated: Secondary | ICD-10-CM

## 2013-04-17 DIAGNOSIS — I10 Essential (primary) hypertension: Secondary | ICD-10-CM

## 2013-04-17 DIAGNOSIS — E7800 Pure hypercholesterolemia, unspecified: Secondary | ICD-10-CM

## 2013-04-17 DIAGNOSIS — I2510 Atherosclerotic heart disease of native coronary artery without angina pectoris: Secondary | ICD-10-CM

## 2013-04-17 DIAGNOSIS — R079 Chest pain, unspecified: Secondary | ICD-10-CM

## 2013-04-17 DIAGNOSIS — J45909 Unspecified asthma, uncomplicated: Secondary | ICD-10-CM

## 2013-04-17 DIAGNOSIS — J309 Allergic rhinitis, unspecified: Secondary | ICD-10-CM

## 2013-04-17 DIAGNOSIS — Z01810 Encounter for preprocedural cardiovascular examination: Secondary | ICD-10-CM

## 2013-04-17 MED ORDER — METOPROLOL TARTRATE 25 MG PO TABS: 12.5000 mg | ORAL_TABLET | Freq: Every day | ORAL | Status: DC

## 2013-04-17 NOTE — Assessment & Plan Note (Signed)
-   continue zyrtec, nasonex - add chlorpheniramine or brompheniramine prn

## 2013-04-17 NOTE — Patient Instructions (Addendum)
PLEASE SCHEDULE EXERCISE MYOVIEW, DX SURG CLEARANCE, 786.50,   PLEASE FOLLOW UP WITH DR. Excell Seltzer IN 2 MONTHS  DECREASE LOPRESSOR TO 12.5 MG DAILY (1/2 TAB OF 25 MG )

## 2013-04-17 NOTE — Progress Notes (Addendum)
[address] [place], [place]  [address] Phone: [phone] Fax:  [phone]  Date:  04/17/2013   ID:  Michael Pearson, DOB Apr 05, 1961, MRN [id number]  PCP:  Loreen Freud, DO  Cardiologist:  Dr. Tonny Bollman     History of Present Illness: Michael Pearson is a 52 y.o. male who returns for surgical clearance.  He is being considered for sigmoid colectomy due to recurrent diverticulitis.  The surgeon is Dr. Harriette Bouillon.  He has a hx of CAD, s/p BMS to the LAD in the setting of a non-STEMI 05/2011, pituitary microadenoma, migraine headaches, HL, HTN. LHC 05/2011: Proximal LAD occluded (treated with PCI), ostial Dx 30-40%, proximal RCA 30-40%, mid RCA 30-40%. Myoview 10/12: EF 58%, diaphragmatic attenuation, no ischemia or scar. Last seen by Dr. Excell Seltzer 05/2012.  His biggest complaint is that of frequent headaches.  They are quite severe, unilateral and associated with excessive tearing. He seems to be describing cluster headaches. He does note chest discomfort associated with severe headaches. He describes this as sharp. It is substernal. He denies any radiating symptoms. It may last 30-40 minutes. It usually resolves when his headache resolves. He does note associated dyspnea, nausea and diaphoresis. He does note some dyspnea with exertion. He describes NYHA class II symptoms. He denies orthopnea or PND. He has mild pedal edema. He denies syncope. He denies near-syncope.  Labs (1/14):   LDL 54, TSH 0.98 Labs (4/14):   K 3.8, Cr 0.9, ALT 29, Hgb 14.6  Wt Readings from Last 3 Encounters:  04/17/13 239 lb (108.41 kg)  04/05/13 237 lb (107.502 kg)  03/30/13 239 lb 9.6 oz (108.682 kg)     Past Medical History  Diagnosis Date  . Migraines     Has been evaluated multiple times in past for chronic headache in which pt has had occasional nose bleeds and bloodshot eyes. This lasted for 4-5 months. CT of the head was negative in May 2012.  [name]     MRI in 2008  demonstrating 4.6 mm area of pituitary gland   . Duodenitis     May 2012 with heme positive stools at this time. Endorses only rare streaking of blood now.  . Colitis   . CAD (coronary artery disease)     NSTEMI 7/12:  Cardiac cath on 7/17: pLAD occluded, Dx 30-40%, pRCA 30-40%, mRCA 30-40%.  Proximal LAD was treated with a BMS.  Echo 7/19:  EF 60-65%, mild LVH  . RBBB (right bundle branch block)     Noted on EKG in 2008  . Kidney stones   . Hyperlipidemia   . Hypertension   . Myocardial infarction 05/17/2011  . Allergy   . Asthma   . Hiatal hernia   . Duodenitis   . Ulcer   . Hx of hemorrhoids   . GERD (gastroesophageal reflux disease)   . Arthritis   . Heart murmur     as a child per the pt    Current Outpatient Prescriptions  Medication Sig Dispense Refill  . acetaminophen (TYLENOL) 325 MG tablet Take 650 mg by mouth 2 (two) times daily as needed.        Marland Kitchen aspirin 81 MG tablet Take 81 mg by mouth daily.        Marland Kitchen atorvastatin (LIPITOR) 40 MG tablet Take 1 tablet (40 mg total) by mouth daily.  30 tablet  9  . esomeprazole (NEXIUM) 40 MG packet Take 40 mg by mouth daily  before breakfast.  30 each  12  . losartan (COZAAR) 25 MG tablet TAKE 1 TABLET (25 MG TOTAL) BY MOUTH DAILY.  30 tablet  2  . metoprolol tartrate (LOPRESSOR) 25 MG tablet TAKE 1/2 TABLET BY MOUTH 2 TIMES DAILY  30 tablet  9  . Multiple Vitamin (MULTIVITAMIN) tablet Take 1 tablet by mouth daily.        . nitroGLYCERIN (NITROSTAT) 0.4 MG SL tablet Place 0.4 mg under the tongue every 5 (five) minutes as needed.        . ranitidine (ZANTAC) 150 MG tablet Take 1 tablet (150 mg total) by mouth 2 (two) times daily.  60 tablet  5  . albuterol (PROVENTIL HFA;VENTOLIN HFA) 108 (90 BASE) MCG/ACT inhaler Inhale 2 puffs into the lungs every 4 (four) hours as needed for wheezing or shortness of breath.      . beclomethasone (QVAR) 40 MCG/ACT inhaler Inhale 2 puffs into the lungs 2 (two) times daily.  1 Inhaler  12  . mometasone  (NASONEX) 50 MCG/ACT nasal spray Place 2 sprays into the nose daily.  17 g  2   No current facility-administered medications for this visit.    Allergies:   No Known Allergies  Social History:  The patient  reports that he has never smoked. He has never used smokeless tobacco. He reports that he does not drink alcohol or use illicit drugs.   ROS:  Please see the history of present illness.   He has noted recent bright red blood per rectum. He recently underwent colonoscopy.   All other systems reviewed and negative.   PHYSICAL EXAM: VS:  BP 118/78  Pulse 45  Ht 6\' 2"  (1.88 m)  Wt 239 lb (108.41 kg)  BMI 30.67 kg/m2 Well nourished, well developed, in no acute distress HEENT: normal Neck: no JVD Vascular: No carotid bruits bilaterally Cardiac:  normal S1, S2; RRR; no murmur Lungs:  clear to auscultation bilaterally, no wheezing, rhonchi or rales Abd: soft, nontender, no hepatomegaly Ext: no edema Skin: warm and dry Neuro:  CNs 2-12 intact, no focal abnormalities noted  EKG:  Sinus bradycardia, HR 45, RBBB, no significant change when compared to prior tracings     ASSESSMENT AND PLAN:  1. Surgical Clearance:  Patient has had recent episodes of chest pain. It has been 2 years since his last assessment for ischemia. He will require further testing to include stress testing prior to clearing him for surgery. 2. Chest Pain:  Somewhat atypical. He has noted only for the last 1-2 months. He really denies exertional component. It has been 2 years since his last assessment for ischemia.  Schedule ETT-Myoview. 3. CAD: Continue aspirin, statin. Arrange Myoview as noted. 4. Bradycardia: He is asymptomatic. However, his heart rate is much slower than it has been in the past. I will decrease his Lopressor to once daily. If his heart rate continues to remain low, consider discontinuing beta blocker.  As noted, ETT-Myoview will be arranged. Hopefully, we can assess chronotropic competence with his  exercise treadmill test 5. Hyperlipidemia: Continue statin. 6. Hypertension: Controlled. 7. Headaches: I have encouraged him to follow up with his PCP for further evaluation and management. Question if he has cluster headaches. 8. Disposition: Follow up with Dr. Excell Seltzer in 2 months.  Signed, Tereso Newcomer, PA-C  04/17/2013 11:52 AM    Addendum 04/26/2013 1:52 PM: ETT-Myoview 6/14:  Inf thinning, no ischemia, EF 64%, normal study Patient may proceed with surgery at acceptable cardiovascular risk.  Signed, Tereso Newcomer, PA-C   04/26/2013 1:52 PM

## 2013-04-17 NOTE — Progress Notes (Signed)
  Subjective:    Patient ID: Michael Pearson, male    DOB: Jun 27, 1961, 52 y.o.   MRN: [id number]  HPI 52 yo never smoker, hx HTN, CAD s/p MI and PTCI 7/12, allergies. Referred for DOE by Dr Laury Axon.  He has been dealing with exertional SOB for years, associated with migraines HA and sinus congestion, fullness. He relates this to an exposure to battery acid about 9 yrs ago; he poured city water (chlorinated) onto battery acid, likely had a HCl inhalational injury. He has SOB with walking, worse w heat. For the last few weeks has had a cough, initially productive mucous, but then dry. He started lisinopril after the MI and cough worsened.   ROV 08/31/11 -- f/u for dyspnea. He has CAD s/p PTCI. His breathing improved some after the PTCI, but still bothersome so we did PFT. 10/3 Dr Excell Seltzer changed ACE-I to to ARB - cough some better. He is taking QVAR bid. PFT today show possible mild AFL based on BD response. He doesn't have a rescue inhaler. He is on nasonex and zyrtec for PND and allergies.    PULMONARY FUNCTON TEST 08/31/2011  FVC 5.3  FEV1 4.32  FEV1/FVC 81.5  FVC  % Predicted 97  FEV % Predicted 109  FeF 25-75 4.28  FeF 25-75 % Predicted 3.74    ROV 09/01/12 -- follows up for mild intermittent asthma, cough, allergies. We continued QVAR, zyrtec, nasonex.  Has done well for the last year, no real exacerbations. He is having a lot of allergy sx and HA. Having clear nasal gtt. He rarely uses SABA, but he did use it yesterday.   ROV 04/17/13 -- follows up for mild intermittent asthma, cough, allergies. Also w hx CAD and NSTEMI 2012. He has been doing well until last few days until he developed congestion, HA, sinus fullness. He has not had any flares of asthma, uses albuterol rarely, but has used it last few days for congestion. He is about to undergo abd sgy for diverticular abscess with Dr Luisa Hart. He is to undergo cardiac stress testing on 6/25. He is using zyrtec and nasonex.     Objective:   Physical Exam Filed Vitals:   04/17/13 1522  BP: 110/68  Pulse: 62  Temp: 97.7 F (36.5 C)    Gen: Pleasant, well-nourished, in no distress,  normal affect  ENT: No lesions,  mouth clear,  oropharynx clear, no postnasal drip, no evidence eardrum perforation, some B fluid present, no evidence otitis media.   Neck: No JVD, no TMG, no carotid bruits  Lungs: No use of accessory muscles, clear without rales or rhonchi  Cardiovascular: RRR, heart sounds normal, no murmur or gallops, no peripheral edema  Musculoskeletal: No deformities, no cyanosis or clubbing  Neuro: alert, non focal  Skin: Warm, no lesions or rashes     Assessment & Plan:  Mild intermittent asthma Stable. FEV1 in 2012 was 4.32L. He can undergo abdominal surgery from pulmonary standpoint.  - continue QVAR + SABA prn - rov 6 mon  Chronic allergic rhinitis - continue zyrtec, nasonex - add chlorpheniramine or brompheniramine prn

## 2013-04-17 NOTE — Patient Instructions (Addendum)
Please continue your QVAR and albuterol as you are taking them Continue zyrtec and nasonex spray Start an over-the-counter decongestant containing either chlorpheniramine or brompheniramine as directed for your congestion There are no pulmonary barriers to having your colon surgery performed. You do need to have cardiac stress test as planned. We will send this information to Dr Cornett's office.  Follow with Dr Delton Coombes in 6 months or sooner if you have any problems

## 2013-04-17 NOTE — Assessment & Plan Note (Addendum)
Stable. FEV1 in 2012 was 4.32L. He can undergo abdominal surgery from pulmonary standpoint.  - continue QVAR + SABA prn - rov 6 mon

## 2013-04-25 ENCOUNTER — Ambulatory Visit (HOSPITAL_COMMUNITY): Payer: Self-pay | Attending: Cardiovascular Disease | Admitting: Radiology

## 2013-04-25 VITALS — BP 118/82 | Ht 75.0 in | Wt 233.0 lb

## 2013-04-25 DIAGNOSIS — R110 Nausea: Secondary | ICD-10-CM | POA: Insufficient documentation

## 2013-04-25 DIAGNOSIS — Z01810 Encounter for preprocedural cardiovascular examination: Secondary | ICD-10-CM

## 2013-04-25 DIAGNOSIS — R079 Chest pain, unspecified: Secondary | ICD-10-CM | POA: Insufficient documentation

## 2013-04-25 DIAGNOSIS — R0989 Other specified symptoms and signs involving the circulatory and respiratory systems: Secondary | ICD-10-CM | POA: Insufficient documentation

## 2013-04-25 DIAGNOSIS — I2510 Atherosclerotic heart disease of native coronary artery without angina pectoris: Secondary | ICD-10-CM

## 2013-04-25 DIAGNOSIS — I10 Essential (primary) hypertension: Secondary | ICD-10-CM | POA: Insufficient documentation

## 2013-04-25 DIAGNOSIS — I252 Old myocardial infarction: Secondary | ICD-10-CM | POA: Insufficient documentation

## 2013-04-25 DIAGNOSIS — I517 Cardiomegaly: Secondary | ICD-10-CM | POA: Insufficient documentation

## 2013-04-25 DIAGNOSIS — R0609 Other forms of dyspnea: Secondary | ICD-10-CM | POA: Insufficient documentation

## 2013-04-25 DIAGNOSIS — R0602 Shortness of breath: Secondary | ICD-10-CM

## 2013-04-25 DIAGNOSIS — R42 Dizziness and giddiness: Secondary | ICD-10-CM | POA: Insufficient documentation

## 2013-04-25 DIAGNOSIS — I4510 Unspecified right bundle-branch block: Secondary | ICD-10-CM | POA: Insufficient documentation

## 2013-04-25 DIAGNOSIS — J45909 Unspecified asthma, uncomplicated: Secondary | ICD-10-CM | POA: Insufficient documentation

## 2013-04-25 MED ORDER — TECHNETIUM TC 99M SESTAMIBI GENERIC - CARDIOLITE
30.0000 | Freq: Once | INTRAVENOUS | Status: AC | PRN
  Administered 2013-04-25: 30 via INTRAVENOUS

## 2013-04-25 MED ORDER — TECHNETIUM TC 99M SESTAMIBI GENERIC - CARDIOLITE
10.0000 | Freq: Once | INTRAVENOUS | Status: AC | PRN
  Administered 2013-04-25: 10 via INTRAVENOUS

## 2013-04-25 NOTE — Progress Notes (Signed)
[facility] 3 NUCLEAR MED [address] [place], [place] [address] [phone]    Cardiology Nuclear Med Study  Michael Pearson is a 52 y.o. male     MRN : [id number]     DOB: 11-May-1961  Procedure Date: 04/25/2013  Nuclear Med Background Indication for Stress Test:  Evaluation for Ischemia, Stent Patency, and Pending Surgical Clearance for GI Surgery By Dr. Harriette Bouillon History: Asthma; 7/12 ECHO EF:60-65%,mild LVH;7/12 Hearth Catheterization,prox LAD occluded>stent residual nonobs.dz:7/12 Myocardial Infaction NSTEMI:08/14/11 Myocardial Perfusion study EF=57% no ischemia or scar, diaph/attenuat: 7/12 Stents LAD Cardiac Risk Factors: Hypertension, Lipids and RBBB  Symptoms:  Chest Pain, Dizziness, DOE, Nausea and SOB  ( states most complaints associated with his headache)( chest pain comes/goes with laying lown and turning)   Nuclear Pre-Procedure Caffeine/Decaff Intake:  None > 12 hrs NPO After: 7:30pm   Lungs:  clear O2 Sat: 98% on room air. IV 0.9% NS with Angio Cath:  22g  IV Site: R Antecubital x 1, tolerated well IV Started by:  Irean Hong, RN  Chest Size (in):  46 Cup Size: n/a  Height: 6\' 3"  (1.905 m)  Weight:  233 lb (105.688 kg)  BMI:  Body mass index is 29.12 kg/(m^2). Tech Comments:  Held Lopressor x 24 hrs    Nuclear Med Study 1 or 2 day study: 1 day  Stress Test Type:  Stress  Reading MD: Charlton Haws, MD  Order Authorizing Provider:  Tonny Bollman, MD, and Tereso Newcomer, [facility]  Resting Radionuclide: Technetium 70m Sestamibi  Resting Radionuclide Dose: 11.0 mCi   Stress Radionuclide:  Technetium 47m Sestamibi  Stress Radionuclide Dose: 33.0 mCi           Stress Protocol Rest HR: 95 Stress HR: 157  Rest BP: 118/82 Stress BP: 156/79  Exercise Time (min): 6:00 METS: 7.00   Predicted Max HR: 168 bpm % Max HR: 93.45 bpm Rate Pressure Product: 46962   Dose of Adenosine (mg):  n/a Dose of Lexiscan: n/a mg  Dose of Atropine (mg): n/a Dose of  Dobutamine: n/a mcg/kg/min (at max HR)  Stress Test Technologist: Frederick Peers, EMT-P  Nuclear Technologist:  Domenic Polite, CNMT     Rest Procedure:  Myocardial perfusion imaging was performed at rest 45 minutes following the intravenous administration of Technetium 90m Sestamibi. Rest ECG: NSR voltage criteria for LVH  Stress Procedure:  The patient exercised on the treadmill utilizing the Bruce Protocol for 6:00 minutes. The patient stopped due to SOB and denied any chest pain.  Technetium 33m Sestamibi was injected at peak exercise and myocardial perfusion imaging was performed after a brief delay. Stress ECG: No significant change from baseline ECG  QPS Raw Data Images:  Patient motion noted. Stress Images:  Normal homogeneous uptake in all areas of the myocardium. Rest Images:  Normal homogeneous uptake in all areas of the myocardium. Subtraction (SDS):  SDS 3 scatterred Transient Ischemic Dilatation (Normal <1.22):  0.85 Lung/Heart Ratio (Normal <0.45):  0.27  Quantitative Gated Spect Images QGS EDV:  114 ml QGS ESV:  41 ml  Impression Exercise Capacity:  Fair exercise capacity. BP Response:  Normal blood pressure response. Clinical Symptoms:  There is dyspnea. ECG Impression:  No significant ST segment change suggestive of ischemia. Comparison with Prior Nuclear Study: No images to compare  Overall Impression:  Normal stress nuclear study. Thinning of the inferior wall not thought to be significant  LV Ejection Fraction: 64%.  LV Wall Motion:  NL LV  Function; NL Wall Motion  [employer]

## 2013-04-26 ENCOUNTER — Telehealth: Payer: Self-pay | Admitting: *Deleted

## 2013-04-26 ENCOUNTER — Encounter: Payer: Self-pay | Admitting: Physician Assistant

## 2013-04-26 NOTE — Telephone Encounter (Signed)
pt notified about myoview results and is cleared to have surgery. I will fax myoview results to surgeon Dr. Harriette Bouillon, pt aid ok and thank you

## 2013-04-26 NOTE — Telephone Encounter (Signed)
Message copied by Tarri Fuller on Thu Apr 26, 2013  5:33 PM ------      Message from: [place], [place] T      Created: Thu Apr 26, 2013  1:51 PM       Low risk stress test      Continue current therapy      He may proceed with his surgery from cardiac perspective at acceptable risk.      Please fax to his surgeon.      Tereso Newcomer, PA-C        04/26/2013 1:50 PM ------

## 2013-04-30 ENCOUNTER — Encounter (INDEPENDENT_AMBULATORY_CARE_PROVIDER_SITE_OTHER): Payer: Self-pay | Admitting: Surgery

## 2013-04-30 ENCOUNTER — Ambulatory Visit (INDEPENDENT_AMBULATORY_CARE_PROVIDER_SITE_OTHER): Payer: Self-pay | Admitting: Surgery

## 2013-04-30 VITALS — BP 122/68 | HR 60 | Temp 97.2°F | Resp 16 | Ht 75.0 in | Wt 234.0 lb

## 2013-04-30 DIAGNOSIS — K5732 Diverticulitis of large intestine without perforation or abscess without bleeding: Secondary | ICD-10-CM

## 2013-04-30 NOTE — Progress Notes (Signed)
Patient ID: Michael Pearson, male   DOB: Nov 06, 1960, 52 y.o.   MRN: [id number]  Chief Complaint  Patient presents with  . Follow-up    colon Sx    HPI Michael Pearson is a 52 y.o. male.  Patient sent at the request of Dr.Pyrtle for  history of diverticulitis. He has had 2 attacks of left for quadrant pain last in April 2014 required admission to the hospital for treatment of sigmoid diverticulitis diagnosed by CT scan. In a small abscess that was not percutaneously drained and he improved on antibiotics. He thinks he has had 2 attacks in the past. Currently, denies abdominal pain, nausea, vomiting, diarrhea. He has a history of a previous colonoscopy in 2012 showing ulceration from the rectum up to the sigmoid colon it was felt to be inflammatory versus ischemic. HPI  Past Medical History  Diagnosis Date  . Migraines     Has been evaluated multiple times in past for chronic headache in which pt has had occasional nose bleeds and bloodshot eyes. This lasted for 4-5 months. CT of the head was negative in May 2012.  [name]     MRI in 2008 demonstrating 4.6 mm area of pituitary gland   . Duodenitis     May 2012 with heme positive stools at this time. Endorses only rare streaking of blood now.  . Colitis   . CAD (coronary artery disease)     NSTEMI 7/12:  Cardiac cath on 7/17: pLAD occluded, Dx 30-40%, pRCA 30-40%, mRCA 30-40%.  Proximal LAD was treated with a BMS.  Echo 7/19:  EF 60-65%, mild LVH.    ETT-Myoview 6/14:  Inf thinning, no ischemia, EF 64%, normal study  . RBBB (right bundle branch block)     Noted on EKG in 2008  . Kidney stones   . Hyperlipidemia   . Hypertension   . Myocardial infarction 05/17/2011  . Allergy   . Asthma   . Hiatal hernia   . Duodenitis   . Ulcer   . Hx of hemorrhoids   . GERD (gastroesophageal reflux disease)   . Arthritis   . Heart murmur     as a child per the pt    Past Surgical History  Procedure Laterality Date  . Hernia repair      In 20's   . Tooth extraction    . Hammer toe surgery    . Egd  03/05/2011    Dr. Elnoria Howard  . Flexible sigmoidoscopy  03/05/2011    Dr. Elnoria Howard  . Heart stent      Family History  Problem Relation Age of Onset  . Stroke Mother   . Heart attack Father 51    MI  . Skin cancer Father   . Colon polyps Maternal Aunt   . Colon cancer Maternal Aunt   . Obesity Brother   . Coronary artery disease Brother     Possible, pt not sure of specifics  . Hypertension Brother   . Colon polyps Paternal Grandmother     Social History History  Substance Use Topics  . Smoking status: Never Smoker   . Smokeless tobacco: Never Used  . Alcohol Use: No    No Known Allergies  Current Outpatient Prescriptions  Medication Sig Dispense Refill  . acetaminophen (TYLENOL) 325 MG tablet Take 650 mg by mouth 2 (two) times daily as needed.        Marland Kitchen aspirin 81 MG tablet Take 81 mg by mouth daily.        Marland Kitchen  atorvastatin (LIPITOR) 40 MG tablet Take 1 tablet (40 mg total) by mouth daily.  30 tablet  9  . esomeprazole (NEXIUM) 40 MG packet Take 40 mg by mouth daily before breakfast.  30 each  12  . losartan (COZAAR) 25 MG tablet TAKE 1 TABLET (25 MG TOTAL) BY MOUTH DAILY.  30 tablet  2  . metoprolol tartrate (LOPRESSOR) 25 MG tablet Take 25 mg by mouth daily.      . Multiple Vitamin (MULTIVITAMIN) tablet Take 1 tablet by mouth daily.        . nitroGLYCERIN (NITROSTAT) 0.4 MG SL tablet Place 0.4 mg under the tongue every 5 (five) minutes as needed.        . ranitidine (ZANTAC) 150 MG tablet Take 1 tablet (150 mg total) by mouth 2 (two) times daily.  60 tablet  5  . albuterol (PROVENTIL HFA;VENTOLIN HFA) 108 (90 BASE) MCG/ACT inhaler Inhale 2 puffs into the lungs every 4 (four) hours as needed for wheezing or shortness of breath.      . beclomethasone (QVAR) 40 MCG/ACT inhaler Inhale 2 puffs into the lungs 2 (two) times daily.  1 Inhaler  12  . mometasone (NASONEX) 50 MCG/ACT nasal spray Place 2 sprays into the nose daily.  17 g  2    No current facility-administered medications for this visit.    Review of Systems Review of Systems  Constitutional: Negative for chills.  HENT: Positive for postnasal drip.   Eyes: Negative.   Respiratory: Positive for shortness of breath.   Cardiovascular: Negative for chest pain and leg swelling.  Gastrointestinal: Positive for abdominal pain and abdominal distention.  Endocrine:       Pituitary problems  Genitourinary: Negative.   Allergic/Immunologic: Positive for environmental allergies.  Neurological: Positive for headaches.  Hematological: Negative.     Blood pressure 122/68, pulse 60, temperature 97.2 F (36.2 C), temperature source Oral, resp. rate 16, height 6\' 3"  (1.905 m), weight 234 lb (106.142 kg).  Physical Exam Physical Exam  Constitutional: He is oriented to person, place, and time. He appears well-developed and well-nourished.  HENT:  Head: Normocephalic and atraumatic.  Eyes: EOM are normal. Pupils are equal, round, and reactive to light.  Neck: Normal range of motion. Neck supple.  Cardiovascular: Normal rate and regular rhythm.   No murmur heard. Pulmonary/Chest: Effort normal and breath sounds normal. He has no wheezes.  Abdominal: Soft. He exhibits no distension and no mass. There is no tenderness. There is no rebound.  Musculoskeletal: Normal range of motion.  Neurological: He is alert and oriented to person, place, and time.  Skin: Skin is warm and dry.  Psychiatric: He has a normal mood and affect. His behavior is normal. Judgment and thought content normal.    Data Reviewed Clinical Data: Abdominal pain. Nausea and vomiting. Sigmoid  diverticulitis.  CT ABDOMEN AND PELVIS WITH CONTRAST  Technique: Multidetector CT imaging of the abdomen and pelvis was  performed following the standard protocol during bolus  administration of intravenous contrast.  Contrast: OMNIPAQUE IOHEXOL 300 MG/ML SOLN 02/03/2013  Comparison: 02/03/2013   Findings: Proximal sigmoid diverticulitis shows improvement since  previous study. There is a tiny diverticular abscess measuring 1.6  cm, however previously seen extraluminal gas collections are no  longer visualized.  No other inflammatory process or abnormal fluid collections are  seen. Appendix is normal appearance. No evidence of bowel  obstruction.  The liver, gallbladder, pancreas, spleen, adrenal glands, and  kidneys are normal appearance. No  evidence of hydronephrosis. No  soft tissue masses or lymphadenopathy identified.  IMPRESSION:  Improving proximal sigmoid diverticulitis since prior study.  Decreased size of tiny diverticular abscess, now measuring 1.6 cm.  Original Report Authenticated By: Myles Rosenthal, M.D.    Assessment    Diverticulitis history of 2 attacks this year, to buy small abscess currently asymptomatic Patient Active Problem List   Diagnosis Date Noted  . History of diverticulitis of colon 03/30/2013  . Acute diverticulitis 02/03/2013  . HTN (hypertension) 02/03/2013  . Chronic allergic rhinitis 11/03/2012  . Heme positive stool 11/03/2012  . Foot injury 05/15/2012  . Cough 07/22/2011  . Migraines 06/17/2011  . Mild intermittent asthma 06/17/2011  . Non-ST elevation myocardial infarction (NSTEMI) 06/07/2011  . Coronary atherosclerosis of native coronary artery 06/07/2011  . Hyperlipidemia 06/07/2011      Plan    Colonoscopy otherwise normal except for inflammation of the sigmoid colon and diverticuli. He needs a barium enema to define his anatomy prior to surgery. Given his repeated attacks of diverticulitis I feel he is a good candidate for laparoscopic sigmoid colectomy. Cardiac and pulmonary clearance had been obtained.   : The patient presents do to sigmoid colon diverticulitis. . Both operative and nonoperative treatments discussed. Risk of bleeding, infection, anastomotic leak, possible colostomy, possible intra-abdominal sepsis leading to  death, possible organ failure, possible injury to intra-abdominal organs, possible injury to ureter bladder kidneys and pelvic blood vessels, possible injury to nerves, possible reoperation, possible open procedure with possible hernia or wound infection, DVT, cardiovascular event discussed possible complications. The patient agrees.     Ripley Bogosian A. 04/30/2013, 3:27 PM

## 2013-04-30 NOTE — Patient Instructions (Signed)
Removal of the Colon (Colectomy) Care After AFTER THE PROCEDURE After surgery, you will be taken to the recovery area where a nurse will watch you and check your progress. After the recovery area you will go to your hospital room. Your surgeon will determine when it is alright for you to take fluids and foods. You will be given pain medications to keep you comfortable. HOME CARE INSTRUCTIONS  Once home, an ice pack applied to the operative site may help with discomfort and keep swelling down.  Change dressings as directed.  Only take over-the-counter or prescription medicines for pain, discomfort, or fever as directed by your caregiver.  You may continue normal diet and activities as directed.  There should be no heavy lifting (more than 10 pounds), strenuous activities or contact sports for three weeks, or as directed.  Keep the wound dry and clean. The wound may be washed gently with soap and water. Gently blot or dab dry following cleansing, without rubbing. Do not take baths, use swimming pools, or use hot tubs for ten days, or as instructed by your caregivers.  If you have a colostomy, care for it as you have been shown. SEEK MEDICAL CARE IF:   There is redness, swelling, or increasing pain in the wound area.  Pus is coming from the wound.  An unexplained oral temperature above 102 F (38.9 C) develops.  You notice a foul smell coming from the wound or dressing.  There is a breaking open of a wound (edges not staying together) after the sutures have been removed.  There is increasing abdominal pain. SEEK IMMEDIATE MEDICAL CARE IF:   A rash develops.  There is difficulty breathing, or development of a reaction or side effects to medications given. Document Released: 05/18/2004 Document Revised: 01/10/2012 Document Reviewed: 11/21/2007 [facility] Patient Information 2014 [place], [place]. Laparoscopic Colon Resection Laparoscopic colon resection is a relatively new procedure  and is not performed in all centers. It may be done to remove a piece of the colon (large intestine) that may be sore and reddened (inflamed). It may be done to remove a portion of bowel that is blocked. The intestine may be blocked because of colon cancer. It is sometimes used to treat diseases of the bowel in which there are multiple small outgrowths from the bowel wall (polyps), which may predispose a person to cancer. LET YOUR CAREGIVER KNOW ABOUT:  Allergies.  Medications taken including herbs, eye drops, over the counter medications, and creams.  Use of steroids (by mouth or creams).  Previous problems with anesthetics or novocaine.  Possibility of pregnancy, if this applies.  History of blood clots (thrombophlebitis).  History of bleeding or blood problems.  Previous surgery.  Other health problems. RISKS AND COMPLICATIONS Some problems, which occur following this procedure, include:  Infection: A germ starts growing in the wound. This can usually be treated with medicine that kills germs (antibiotics).  Bleeding following surgery may be a complication of almost all surgeries. Your surgeon takes every precaution to keep this from happening.  Damage to other organs may occur. If damage to other organs or excessive bleeding should occur it may be necessary to convert the laparoscopic procedure into an open abdominal (belly) procedure. This means the surgery is performed by opening the abdomen and performing the surgery under direct vision. Scarring from previous surgeries or disease may also be a cause to change this procedure to an open abdominal operation.  Sometimes a leak can occur in the line where the  bowel was sewn together after the portion of bowel was removed.  It is possible for the bowel to become obstructed in the area where it was sewn together. When this happens, it is sometimes necessary to operate again to repair this. This may be accomplished using the  laparoscope or opening the abdomen and operating in the usual manner without the laparoscope. BEFORE THE PROCEDURE You should be present 2 hours prior to your procedure or as instructed.  PROCEDURE  Laparoscopic means a laparoscope (a small pencil sized telescope) is used. You are made to sleep with medicine (anesthetized). Your surgeon inflates your belly (abdomen) with a needle like device (trocar and cannula). The inflation is done with a harmless gas (carbon dioxide). This makes your organs easier to see. The laparoscope is inserted into your abdomen through a small slit (incision) that allows your surgeon to see into the abdomen. Other small instruments, such as probes and operating instruments, are inserted into the abdomen through other small openings (ports). These ports allow the surgeon to perform the operation. Often surgeons attach a video camera to the laparoscope to enlarge the view. During the procedure the portion of bowel to be removed is taken out through one of the ports. A port may have to be enlarged if the bowel is too large to be removed. In this case a small incision will be made and some times the bowel is reconnected (anastamosis) outside the abdomen. After the procedure, the gas is released, and your incisions are closed with stitches (sutures). Because these incisions are small (usually less than one-half inch), there is usually minimal discomfort following the procedure. AFTER THE PROCEDURE The recovery time, if there are no problems, is shortened compared to regular surgery. You will rest in a recovery room until you are stable and doing well. Following this, barring other problems you will be allowed to return to your room. Recovery times vary depending on what is found at surgery, the age of the patient, general health, etc. SEEK IMMEDIATE MEDICAL CARE IF:   There is redness, swelling, or increasing pain in the wound area.  Pus is coming from the wound.  An unexplained  oral temperature above 102 F (38.9 C) develops or as directed.  You notice a foul smell coming from the wound or dressing.  There is a breaking open of a wound (edges not staying together) after sutures have been removed.  You develop increasing abdominal pain. Document Released: 01/08/2003 Document Revised: 01/10/2012 Document Reviewed: 11/17/2007 [facility] Patient Information 2014 [place], [place].

## 2013-05-08 ENCOUNTER — Telehealth (INDEPENDENT_AMBULATORY_CARE_PROVIDER_SITE_OTHER): Payer: Self-pay | Admitting: General Surgery

## 2013-05-08 NOTE — Telephone Encounter (Signed)
Per  MR Spinney he cancelled pre op test due to not having the money to have  DG colon preformed

## 2013-05-09 ENCOUNTER — Other Ambulatory Visit: Payer: Self-pay

## 2013-05-18 ENCOUNTER — Ambulatory Visit: Payer: Self-pay | Admitting: Cardiovascular Disease

## 2013-07-04 ENCOUNTER — Ambulatory Visit: Payer: Self-pay | Admitting: Cardiovascular Disease

## 2013-11-30 ENCOUNTER — Other Ambulatory Visit: Payer: Self-pay | Admitting: Cardiovascular Disease

## 2014-01-02 ENCOUNTER — Other Ambulatory Visit: Payer: Self-pay | Admitting: Cardiovascular Disease

## 2014-02-04 ENCOUNTER — Other Ambulatory Visit: Payer: Self-pay | Admitting: Cardiovascular Disease

## 2014-02-15 ENCOUNTER — Ambulatory Visit (INDEPENDENT_AMBULATORY_CARE_PROVIDER_SITE_OTHER): Payer: Self-pay | Admitting: Cardiovascular Disease

## 2014-02-15 ENCOUNTER — Encounter: Payer: Self-pay | Admitting: Cardiovascular Disease

## 2014-02-15 VITALS — BP 131/81 | HR 73 | Ht 75.0 in | Wt 230.0 lb

## 2014-02-15 DIAGNOSIS — E7800 Pure hypercholesterolemia, unspecified: Secondary | ICD-10-CM

## 2014-02-15 DIAGNOSIS — I2510 Atherosclerotic heart disease of native coronary artery without angina pectoris: Secondary | ICD-10-CM

## 2014-02-15 NOTE — Progress Notes (Signed)
HPI:  53 year old gentleman presenting for followup evaluation.  This gentleman has coronary artery disease and underwent PCI of the LAD in 2012 after presenting with a non-ST elevation infarction. He had no other high-grade coronary disease noted. His left ventricular function was normal.  His last nuclear study in 2014 demonstrating no ischemia. The patient has had no substernal chest pain or pressure. He has had a great deal of problems with allergies and he is wearing a mask today. He describes spells where he has an environmental allergen that sets off marked coughing, shortness of breath, and severe hypertension. He develops a severe headache along the right side of his head. He has been followed in the pulmonary clinic.  Lipids were drawn January 2014: Lipid Panel     Component Value Date/Time   CHOL 111 11/03/2012 0909   TRIG 53.0 11/03/2012 0909   HDL 46.20 11/03/2012 0909   CHOLHDL 2 11/03/2012 0909   VLDL 10.6 11/03/2012 0909   LDLCALC 54 11/03/2012 0909     Outpatient Encounter Prescriptions as of 02/15/2014  Medication Sig  . acetaminophen (TYLENOL) 325 MG tablet Take 650 mg by mouth 2 (two) times daily as needed.    Marland Kitchen albuterol (PROVENTIL HFA;VENTOLIN HFA) 108 (90 BASE) MCG/ACT inhaler Inhale 2 puffs into the lungs every 4 (four) hours as needed for wheezing or shortness of breath.  Marland Kitchen aspirin 81 MG tablet Take 81 mg by mouth daily.    Marland Kitchen atorvastatin (LIPITOR) 40 MG tablet Take 1 tablet (40 mg total) by mouth daily.  . beclomethasone (QVAR) 40 MCG/ACT inhaler Inhale 2 puffs into the lungs 2 (two) times daily.  Marland Kitchen esomeprazole (NEXIUM) 40 MG packet Take 40 mg by mouth daily before breakfast.  . losartan (COZAAR) 25 MG tablet TAKE 1 TABLET BY [facility] ONCE A DAY **NEEDS APPOINTMENT**  . metoprolol tartrate (LOPRESSOR) 25 MG tablet Take 25 mg by mouth daily.  . mometasone (NASONEX) 50 MCG/ACT nasal spray Place 2 sprays into the nose daily.  . Multiple Vitamin (MULTIVITAMIN) tablet Take 1  tablet by mouth daily.    . nitroGLYCERIN (NITROSTAT) 0.4 MG SL tablet Place 0.4 mg under the tongue every 5 (five) minutes as needed.    . ranitidine (ZANTAC) 150 MG tablet Take 1 tablet (150 mg total) by mouth 2 (two) times daily.    No Known Allergies  Past Medical History  Diagnosis Date  . Migraines     Has been evaluated multiple times in past for chronic headache in which pt has had occasional nose bleeds and bloodshot eyes. This lasted for 4-5 months. CT of the head was negative in May 2012.  Nanine Means     MRI in 2008 demonstrating 4.6 mm area of pituitary gland   . Duodenitis     May 2012 with heme positive stools at this time. Endorses only rare streaking of blood now.  . Colitis   . CAD (coronary artery disease)     NSTEMI 7/12:  Cardiac cath on 7/17: pLAD occluded, Dx 30-40%, pRCA 30-40%, mRCA 30-40%.  Proximal LAD was treated with a BMS.  Echo 7/19:  EF 60-65%, mild LVH.    ETT-Myoview 6/14:  Inf thinning, no ischemia, EF 64%, normal study  . RBBB (right bundle branch block)     Noted on EKG in 2008  . Kidney stones   . Hyperlipidemia   . Hypertension   . Myocardial infarction 05/17/2011  . Allergy   . Asthma   . Hiatal  hernia   . Duodenitis   . Ulcer   . Hx of hemorrhoids   . GERD (gastroesophageal reflux disease)   . Arthritis   . Heart murmur     as a child per the pt    ROS: Negative except as per HPI  BP 131/81  Pulse 73  Ht 6\' 3"  (1.905 m)  Wt 230 lb (104.327 kg)  BMI 28.75 kg/m2  PHYSICAL EXAM: Pt is alert and oriented, NAD HEENT: normal Neck: JVP - normal, carotids 2+= without bruits Lungs: CTA bilaterally CV: RRR without murmur or gallop Abd: soft, NT, Positive BS, no hepatomegaly Ext: no C/C/E, distal pulses intact and equal Skin: warm/dry no rash  Myoview scan 04/26/2013: QPS  Raw Data Images: Patient motion noted.  Stress Images: Normal homogeneous uptake in all areas of the myocardium.  Rest Images: Normal homogeneous uptake in  all areas of the myocardium.  Subtraction (SDS): SDS 3 scatterred  Transient Ischemic Dilatation (Normal <1.22): 0.85  Lung/Heart Ratio (Normal <0.45): 0.27  Quantitative Gated Spect Images  QGS EDV: 114 ml  QGS ESV: 41 ml  Impression  Exercise Capacity: Fair exercise capacity.  BP Response: Normal blood pressure response.  Clinical Symptoms: There is dyspnea.  ECG Impression: No significant ST segment change suggestive of ischemia.  Comparison with Prior Nuclear Study: No images to compare  Overall Impression: Normal stress nuclear study. Thinning of the inferior wall not thought to be significant  LV Ejection Fraction: 64%. LV Wall Motion: NL LV Function; NL Wall Motion  [facility] [place]    ASSESSMENT AND PLAN: 1. Coronary artery disease, native vessel. The patient is stable without symptoms of angina. He had a normal stress Myoview scan last year. He will continue on his current medicines which include aspirin and a statin drug.  2. Hypertension. Sounds like he is having some labile blood pressure swings. However, his blood pressure is well controlled today. Elevated blood pressures seem to be related to severe allergies. Advised to followup with his primary physician or pulmonologist.  3. Hyperlipidemia. Continue atorvastatin. Repeat lipids and LFTs.  For followup I will see him back in one year  Sherren Mocha 02/15/2014 3:56 PM

## 2014-02-15 NOTE — Patient Instructions (Addendum)
Your physician wants you to follow-up in: 1 YEAR with Dr Burt Knack.  You will receive a reminder letter in the mail two months in advance. If you don't receive a letter, please call our office to schedule the follow-up appointment.  Your physician recommends that you continue on your current medications as directed. Please refer to the Current Medication list given to you today.  Your physician recommends that you return for a FASTING LIPID and LIVER profile--nothing to eat or drink after midnight, lab opens at 7:30 am

## 2014-02-19 ENCOUNTER — Other Ambulatory Visit (INDEPENDENT_AMBULATORY_CARE_PROVIDER_SITE_OTHER): Payer: Self-pay

## 2014-02-19 DIAGNOSIS — E7800 Pure hypercholesterolemia, unspecified: Secondary | ICD-10-CM

## 2014-02-19 DIAGNOSIS — I2510 Atherosclerotic heart disease of native coronary artery without angina pectoris: Secondary | ICD-10-CM

## 2014-02-19 LAB — HEPATIC FUNCTION PANEL
ALT: 28 U/L (ref 0–53)
AST: 26 U/L (ref 0–37)
Albumin: 4 g/dL (ref 3.5–5.2)
Alkaline Phosphatase: 44 U/L (ref 39–117)
BILIRUBIN DIRECT: 0.1 mg/dL (ref 0.0–0.3)
Total Bilirubin: 0.9 mg/dL (ref 0.3–1.2)
Total Protein: 6.7 g/dL (ref 6.0–8.3)

## 2014-02-19 LAB — LIPID PANEL
CHOL/HDL RATIO: 3
CHOLESTEROL: 132 mg/dL (ref 0–200)
HDL: 51.4 mg/dL (ref >39.00)
LDL CALC: 71 mg/dL (ref 0–99)
Triglycerides: 49 mg/dL (ref 0.0–149.0)
VLDL: 9.8 mg/dL (ref 0.0–40.0)

## 2014-02-22 ENCOUNTER — Encounter: Payer: Self-pay | Admitting: Cardiovascular Disease

## 2014-02-22 NOTE — Telephone Encounter (Signed)
This encounter was created in error - please disregard.

## 2014-02-22 NOTE — Telephone Encounter (Signed)
New message ° ° ° ° ° °Calling to get test results °

## 2014-03-05 ENCOUNTER — Other Ambulatory Visit: Payer: Self-pay | Admitting: Cardiovascular Disease

## 2014-05-06 ENCOUNTER — Other Ambulatory Visit: Payer: Self-pay | Admitting: Cardiovascular Disease

## 2014-05-21 ENCOUNTER — Telehealth: Payer: Self-pay | Admitting: Cardiovascular Disease

## 2014-05-21 NOTE — Telephone Encounter (Signed)
Walk in pt Form " Pt Dropped Off Michael Pearson" Letter gave to Lauren 7.21.15/km

## 2014-06-02 ENCOUNTER — Encounter: Payer: Self-pay | Admitting: Cardiovascular Disease

## 2014-06-02 NOTE — Progress Notes (Signed)
I have received a form from patient's attorney requesting a letter summarizing cardiac reasons for disability. My last note was reviewed and patient had no cardiac symptoms, normal stress test noted. I do not think he is appropriate for disability from cardiac perspective, therefore letter not done.  Sherren Mocha MD 06/02/2014 2:07 PM

## 2014-06-03 ENCOUNTER — Telehealth: Payer: Self-pay | Admitting: Cardiovascular Disease

## 2014-06-03 NOTE — Telephone Encounter (Signed)
Left message for patient to call back and ask for triage.   Sherren Mocha, MD at 06/02/2014 2:05 PM     Status: Sign at close encounter        I have received a form from patient's attorney requesting a letter summarizing cardiac reasons for disability. My last note was reviewed and patient had no cardiac symptoms, normal stress test noted. I do not think he is appropriate for disability from cardiac perspective, therefore letter not done.  Sherren Mocha MD  06/02/2014  2:07 PM

## 2014-06-03 NOTE — Telephone Encounter (Signed)
New message     Have we faxed the papers back to his attorney?  He was checking on the status.

## 2014-06-04 NOTE — Telephone Encounter (Signed)
Left message on machine for pt to contact the office.   

## 2014-06-06 NOTE — Telephone Encounter (Signed)
The pt came into the office today to pick up letter.  I made our greeter aware that she needs to let the pt know that Dr Burt Knack cannot dictate a letter for cardiac disability.

## 2014-09-02 ENCOUNTER — Other Ambulatory Visit: Payer: Self-pay | Admitting: Cardiovascular Disease

## 2014-09-06 ENCOUNTER — Telehealth: Payer: Self-pay | Admitting: Cardiovascular Disease

## 2014-09-06 NOTE — Telephone Encounter (Signed)
Walk in pt Form " Questions" gave to [facility]

## 2014-09-17 ENCOUNTER — Ambulatory Visit (INDEPENDENT_AMBULATORY_CARE_PROVIDER_SITE_OTHER): Payer: Self-pay | Admitting: Family Medicine

## 2014-09-17 ENCOUNTER — Encounter: Payer: Self-pay | Admitting: Family Medicine

## 2014-09-17 VITALS — BP 136/80 | HR 63 | Temp 97.8°F | Wt 220.8 lb

## 2014-09-17 DIAGNOSIS — Z23 Encounter for immunization: Secondary | ICD-10-CM

## 2014-09-17 DIAGNOSIS — E785 Hyperlipidemia, unspecified: Secondary | ICD-10-CM

## 2014-09-17 DIAGNOSIS — R739 Hyperglycemia, unspecified: Secondary | ICD-10-CM

## 2014-09-17 DIAGNOSIS — I10 Essential (primary) hypertension: Secondary | ICD-10-CM

## 2014-09-17 LAB — POCT URINALYSIS DIPSTICK
BILIRUBIN UA: NEGATIVE
GLUCOSE UA: NEGATIVE
Ketones, UA: NEGATIVE
LEUKOCYTES UA: NEGATIVE
NITRITE UA: NEGATIVE
Protein, UA: NEGATIVE
RBC UA: NEGATIVE
Spec Grav, UA: >=1.03
Urobilinogen, UA: 0.2
pH, UA: 6

## 2014-09-17 LAB — GLUCOSE, POCT (MANUAL RESULT ENTRY): POC Glucose: 80 mg/dl (ref 70–99)

## 2014-09-17 MED ORDER — LOSARTAN POTASSIUM 50 MG PO TABS: 50.0000 mg | ORAL_TABLET | Freq: Every day | ORAL | Status: DC

## 2014-09-17 NOTE — Patient Instructions (Signed)

## 2014-09-17 NOTE — Progress Notes (Signed)
Pre visit review using our clinic review tool, if applicable. No additional management support is needed unless otherwise documented below in the visit note. 

## 2014-09-17 NOTE — Progress Notes (Signed)
   Subjective:    Patient ID: Michael Pearson, male    DOB: 05-Dec-1960, 53 y.o.   MRN: [id number]  HPI Pt here f/u cholesterol and bp.  He states his bp has been running high.  He has been having headaches as well.  No vision changes.  No cp, no sob.   Review of Systems    as above Objective:   Physical Exam BP 136/80 mmHg  Pulse 63  Temp(Src) 97.8 F (36.6 C) (Oral)  Wt 220 lb 12.8 oz (100.154 kg)  SpO2 96% General appearance: alert, cooperative, appears stated age and no distress Neck: no adenopathy, no carotid bruit, no JVD, supple, symmetrical, trachea midline and thyroid not enlarged, symmetric, no tenderness/mass/nodules Lungs: clear to auscultation bilaterally Heart: regular rate and rhythm, S1, S2 normal, no murmur, click, rub or gallop Extremities: extremities normal, atraumatic, no cyanosis or edema      Assessment & Plan:  1. Essential hypertension Inc to 50 mg  - losartan (COZAAR) 50 MG tablet; Take 1 tablet (50 mg total) by mouth daily.  Dispense: 30 tablet; Refill: 2  2. Hyperlipidemia Check labs , con't meds - Basic metabolic panel - Hepatic function panel - Lipid panel  3. Hyperglycemia Check labs - Basic metabolic panel - Hemoglobin A1c - Microalbumin / creatinine urine ratio - POCT urinalysis dipstick - POCT Glucose (CBG)  4. Immunization due   - Pneumococcal conjugate vaccine 13-valent - Flu Vaccine QUAD 36+ mos PF IM (Fluarix Quad PF)

## 2014-09-18 LAB — LIPID PANEL
CHOL/HDL RATIO: 2
Cholesterol: 140 mg/dL (ref 0–200)
HDL: 57 mg/dL (ref >39.00)
LDL Cholesterol: 70 mg/dL (ref 0–99)
NONHDL: 83
Triglycerides: 65 mg/dL (ref 0.0–149.0)
VLDL: 13 mg/dL (ref 0.0–40.0)

## 2014-09-18 LAB — HEPATIC FUNCTION PANEL
ALK PHOS: 52 U/L (ref 39–117)
ALT: 31 U/L (ref 0–53)
AST: 28 U/L (ref 0–37)
Albumin: 4.8 g/dL (ref 3.5–5.2)
BILIRUBIN DIRECT: 0.1 mg/dL (ref 0.0–0.3)
Total Bilirubin: 0.8 mg/dL (ref 0.2–1.2)
Total Protein: 7.5 g/dL (ref 6.0–8.3)

## 2014-09-18 LAB — BASIC METABOLIC PANEL WITH GFR
BUN: 11 mg/dL (ref 6–23)
CO2: 29 meq/L (ref 19–32)
Calcium: 10 mg/dL (ref 8.4–10.5)
Chloride: 104 meq/L (ref 96–112)
Creatinine, Ser: 0.8 mg/dL (ref 0.4–1.5)
GFR: 108.75 mL/min
Glucose, Bld: 92 mg/dL (ref 70–99)
Potassium: 4.4 meq/L (ref 3.5–5.1)
Sodium: 142 meq/L (ref 135–145)

## 2014-09-18 LAB — MICROALBUMIN / CREATININE URINE RATIO
Creatinine,U: 161.5 mg/dL
Microalb Creat Ratio: 0.7 mg/g (ref 0.0–30.0)
Microalb, Ur: 1.1 mg/dL (ref 0.0–1.9)

## 2014-09-18 LAB — HEMOGLOBIN A1C: Hgb A1c MFr Bld: 5.8 % (ref 4.6–6.5)

## 2014-10-01 ENCOUNTER — Encounter: Payer: Self-pay | Admitting: Family Medicine

## 2014-10-01 ENCOUNTER — Ambulatory Visit (INDEPENDENT_AMBULATORY_CARE_PROVIDER_SITE_OTHER): Payer: Self-pay | Admitting: Family Medicine

## 2014-10-01 VITALS — BP 124/78 | HR 76 | Temp 97.9°F | Wt 221.6 lb

## 2014-10-01 DIAGNOSIS — R102 Pelvic and perineal pain: Secondary | ICD-10-CM

## 2014-10-01 DIAGNOSIS — Z87442 Personal history of urinary calculi: Secondary | ICD-10-CM

## 2014-10-01 DIAGNOSIS — M25559 Pain in unspecified hip: Secondary | ICD-10-CM

## 2014-10-01 DIAGNOSIS — I10 Essential (primary) hypertension: Secondary | ICD-10-CM

## 2014-10-01 LAB — POCT URINALYSIS DIPSTICK
Bilirubin, UA: NEGATIVE
Blood, UA: NEGATIVE
GLUCOSE UA: NEGATIVE
Ketones, UA: NEGATIVE
LEUKOCYTES UA: NEGATIVE
Nitrite, UA: NEGATIVE
PROTEIN UA: NEGATIVE
Spec Grav, UA: 1.02
UROBILINOGEN UA: 4
pH, UA: 7.5

## 2014-10-01 NOTE — Patient Instructions (Signed)

## 2014-10-01 NOTE — Progress Notes (Signed)
Pre visit review using our clinic review tool, if applicable. No additional management support is needed unless otherwise documented below in the visit note. 

## 2014-10-02 ENCOUNTER — Ambulatory Visit (HOSPITAL_BASED_OUTPATIENT_CLINIC_OR_DEPARTMENT_OTHER)
Admission: RE | Admit: 2014-10-02 | Discharge: 2014-10-02 | Disposition: A | Discharge status: Home / Self Care | Payer: Self-pay | Source: Ambulatory Visit | Attending: Family Medicine | Admitting: Family Medicine

## 2014-10-02 DIAGNOSIS — Z87442 Personal history of urinary calculi: Secondary | ICD-10-CM

## 2014-10-02 DIAGNOSIS — R3919 Other difficulties with micturition: Secondary | ICD-10-CM | POA: Insufficient documentation

## 2014-10-02 DIAGNOSIS — R102 Pelvic and perineal pain: Secondary | ICD-10-CM

## 2014-10-02 DIAGNOSIS — M5136 Other intervertebral disc degeneration, lumbar region: Secondary | ICD-10-CM | POA: Insufficient documentation

## 2014-10-02 DIAGNOSIS — K5732 Diverticulitis of large intestine without perforation or abscess without bleeding: Secondary | ICD-10-CM | POA: Insufficient documentation

## 2014-10-02 DIAGNOSIS — R110 Nausea: Secondary | ICD-10-CM | POA: Insufficient documentation

## 2014-10-02 NOTE — Progress Notes (Signed)
Subjective:    Patient here for follow-up of elevated blood pressure.  He is not exercising and is adherent to a low-salt diet.  Blood pressure is well controlled at home. Cardiac symptoms: none. Patient denies: chest pain, chest pressure/discomfort, claudication, dyspnea, exertional chest pressure/discomfort, fatigue, irregular heart beat, lower extremity edema, near-syncope, orthopnea, palpitations, paroxysmal nocturnal dyspnea, syncope and tachypnea. Cardiovascular risk factors: dyslipidemia, hypertension, male gender, obesity (BMI >= 30 kg/m2) and sedentary lifestyle. Use of agents associated with hypertension: none. History of target organ damage: none. The patient is also c/o pelvic pain that is similar to when he had a kidney stone.  No fever, chills.   The following portions of the patient's history were reviewed and updated as appropriate:  He  has a past medical history of Migraines; Microadenoma; Duodenitis; Colitis; CAD (coronary artery disease); RBBB (right bundle branch block); Kidney stones; Hyperlipidemia; Hypertension; Myocardial infarction (05/17/2011); Allergy; Asthma; Hiatal hernia; Duodenitis; Ulcer; hemorrhoids; GERD (gastroesophageal reflux disease); Arthritis; and Heart murmur. He  does not have any pertinent problems on file. He  has past surgical history that includes Hernia repair; Tooth extraction; Hammer toe surgery; egd (03/05/2011); Flexible sigmoidoscopy (03/05/2011); and Heart stent. His family history includes Colon cancer in his maternal aunt; Colon polyps in his maternal aunt and paternal grandmother; Coronary artery disease in his brother; Heart attack (age of onset: 2) in his father; Hypertension in his brother; Obesity in his brother; Skin cancer in his father; Stroke in his mother. He  reports that he has never smoked. He has never used smokeless tobacco. He reports that he does not drink alcohol or use illicit drugs. He has a current medication list which includes the  following prescription(s): acetaminophen, albuterol, aspirin, atorvastatin, beclomethasone, esomeprazole, losartan, metoprolol tartrate, mometasone, multivitamin, nitroglycerin, and ranitidine. Current Outpatient Prescriptions on File Prior to Visit  Medication Sig Dispense Refill  . acetaminophen (TYLENOL) 325 MG tablet Take 650 mg by mouth 2 (two) times daily as needed.      Marland Kitchen albuterol (PROVENTIL HFA;VENTOLIN HFA) 108 (90 BASE) MCG/ACT inhaler Inhale 2 puffs into the lungs every 4 (four) hours as needed for wheezing or shortness of breath.    Marland Kitchen aspirin 81 MG tablet Take 81 mg by mouth daily.      Marland Kitchen atorvastatin (LIPITOR) 40 MG tablet Take 1 tablet (40 mg total) by mouth daily. 30 tablet 9  . beclomethasone (QVAR) 40 MCG/ACT inhaler Inhale 2 puffs into the lungs 2 (two) times daily. 1 Inhaler 12  . esomeprazole (NEXIUM) 40 MG packet Take 40 mg by mouth daily before breakfast. 30 each 12  . losartan (COZAAR) 50 MG tablet Take 1 tablet (50 mg total) by mouth daily. 30 tablet 2  . metoprolol tartrate (LOPRESSOR) 25 MG tablet TAKE 1/2 TABLET BY MOUTH TWICE A DAY 30 tablet 5  . mometasone (NASONEX) 50 MCG/ACT nasal spray Place 2 sprays into the nose daily. 17 g 2  . Multiple Vitamin (MULTIVITAMIN) tablet Take 1 tablet by mouth daily.      . nitroGLYCERIN (NITROSTAT) 0.4 MG SL tablet Place 0.4 mg under the tongue every 5 (five) minutes as needed.      . ranitidine (ZANTAC) 150 MG tablet Take 1 tablet (150 mg total) by mouth 2 (two) times daily. 60 tablet 5   No current facility-administered medications on file prior to visit.   He has No Known Allergies..  Review of Systems Pertinent items are noted in HPI.     Objective:    BP  124/78 mmHg  Pulse 76  Temp(Src) 97.9 F (36.6 C) (Oral)  Wt 221 lb 9.6 oz (100.517 kg)  SpO2 98% General appearance: alert, cooperative, appears stated age and no distress Lungs: clear to auscultation bilaterally Heart: S1, S2 normal Abdomen: abnormal findings:   moderate tenderness in the lower abdomen Extremities: extremities normal, atraumatic, no cyanosis or edema    Assessment:    Hypertension, normal blood pressure . Evidence of target organ damage: none.    Plan:    Medication: no change. Dietary sodium restriction. Regular aerobic exercise. Follow up: 6 months and as needed.    1. Pain in joint, pelvic region and thigh, unspecified laterality   2. Pelvic pain in male Pt states if feels liks when he had a kidney stone - POCT Urinalysis Dipstick - CT RENAL STONE STUDY; Future  3. History of kidney stones  - CT RENAL STONE STUDY; Future  4. Essential hypertension stable

## 2014-10-03 ENCOUNTER — Telehealth: Payer: Self-pay | Admitting: Family Medicine

## 2014-10-03 NOTE — Telephone Encounter (Signed)
Caller name:Giannetti Asahel Relation to DT:HYHO Call back number:[phone] Pharmacy:  Reason for call: pt returning your call regarding his test

## 2014-10-03 NOTE — Telephone Encounter (Signed)
MSG left to call the office      KP 

## 2014-10-03 NOTE — Telephone Encounter (Signed)
Notes Recorded by Rosalita Chessman, DO on 10/02/2014 at 3:33 PM No stones-- how is patient feeling  Spoke with patient and he stated it is not as bad as it was before but he is still in some pain, he is also constipated so he is taking something to help him go to the bathroom and drinking a lot of fluids.     KP

## 2014-10-03 NOTE — Telephone Encounter (Signed)
Pt can take miralax otc---- if he has already tried otc---try kristalose 10 g  Po qd  # 30 If he gets not results from this-- or pain worsens ---rto

## 2014-10-04 NOTE — Telephone Encounter (Signed)
Msg left to call the office     KP 

## 2014-10-09 NOTE — Telephone Encounter (Signed)
Discussed with patient and he said he has gotten a little better. No complaints at this time but he has agreed to follow up as needed.      KP

## 2014-12-28 ENCOUNTER — Other Ambulatory Visit: Payer: Self-pay | Admitting: Family Medicine

## 2015-03-27 ENCOUNTER — Other Ambulatory Visit: Payer: Self-pay | Admitting: Family Medicine

## 2015-05-03 ENCOUNTER — Other Ambulatory Visit: Payer: Self-pay | Admitting: Family Medicine

## 2015-05-31 ENCOUNTER — Other Ambulatory Visit: Payer: Self-pay | Admitting: Cardiovascular Disease

## 2015-08-06 ENCOUNTER — Other Ambulatory Visit: Payer: Self-pay | Admitting: Family Medicine

## 2015-08-06 NOTE — Telephone Encounter (Signed)
14 day supply Losartan sent to pharmacy.  Pt last seen by PCP in 10/2014 and advised 6 month follow up. Pt is past due.  Please call pt to arrange appt with Dr Etter Sjogren ASAP.  Thanks!

## 2015-08-08 NOTE — Telephone Encounter (Signed)
LM for pt to call and schedule appt ASAP

## 2015-08-14 ENCOUNTER — Ambulatory Visit (INDEPENDENT_AMBULATORY_CARE_PROVIDER_SITE_OTHER): Payer: Self-pay | Admitting: Family Medicine

## 2015-08-14 ENCOUNTER — Encounter: Payer: Self-pay | Admitting: Family Medicine

## 2015-08-14 VITALS — BP 120/78 | HR 57 | Temp 98.0°F | Ht 75.0 in | Wt 227.4 lb

## 2015-08-14 DIAGNOSIS — G4452 New daily persistent headache (NDPH): Secondary | ICD-10-CM

## 2015-08-14 DIAGNOSIS — I10 Essential (primary) hypertension: Secondary | ICD-10-CM

## 2015-08-14 DIAGNOSIS — R0789 Other chest pain: Secondary | ICD-10-CM

## 2015-08-14 DIAGNOSIS — Z23 Encounter for immunization: Secondary | ICD-10-CM

## 2015-08-14 DIAGNOSIS — R51 Headache: Secondary | ICD-10-CM

## 2015-08-14 DIAGNOSIS — R519 Headache, unspecified: Secondary | ICD-10-CM | POA: Insufficient documentation

## 2015-08-14 DIAGNOSIS — B070 Plantar wart: Secondary | ICD-10-CM

## 2015-08-14 DIAGNOSIS — G4489 Other headache syndrome: Secondary | ICD-10-CM

## 2015-08-14 LAB — CBC WITH DIFFERENTIAL/PLATELET
Basophils Absolute: 0 10*3/uL (ref 0.0–0.1)
Basophils Relative: 0.4 % (ref 0.0–3.0)
EOS PCT: 1.3 % (ref 0.0–5.0)
Eosinophils Absolute: 0.1 10*3/uL (ref 0.0–0.7)
HEMATOCRIT: 42.4 % (ref 39.0–52.0)
HEMOGLOBIN: 14.2 g/dL (ref 13.0–17.0)
LYMPHS PCT: 21.1 % (ref 12.0–46.0)
Lymphs Abs: 1.9 10*3/uL (ref 0.7–4.0)
MCHC: 33.6 g/dL (ref 30.0–36.0)
MCV: 94.9 fl (ref 78.0–100.0)
MONO ABS: 0.8 10*3/uL (ref 0.1–1.0)
Monocytes Relative: 9 % (ref 3.0–12.0)
Neutro Abs: 6.1 10*3/uL (ref 1.4–7.7)
Neutrophils Relative %: 68.2 % (ref 43.0–77.0)
PLATELETS: 224 10*3/uL (ref 150.0–400.0)
RBC: 4.46 Mil/uL (ref 4.22–5.81)
RDW: 13.7 % (ref 11.5–15.5)
WBC: 8.9 10*3/uL (ref 4.0–10.5)

## 2015-08-14 LAB — COMPREHENSIVE METABOLIC PANEL
ALBUMIN: 4.1 g/dL (ref 3.5–5.2)
ALT: 21 U/L (ref 0–53)
AST: 18 U/L (ref 0–37)
Alkaline Phosphatase: 50 U/L (ref 39–117)
BUN: 10 mg/dL (ref 6–23)
CHLORIDE: 100 meq/L (ref 96–112)
CO2: 32 mEq/L (ref 19–32)
CREATININE: 0.8 mg/dL (ref 0.40–1.50)
Calcium: 9.8 mg/dL (ref 8.4–10.5)
GFR: 106.82 mL/min (ref >60.00)
Glucose, Bld: 99 mg/dL (ref 70–99)
Potassium: 3.9 mEq/L (ref 3.5–5.1)
SODIUM: 139 meq/L (ref 135–145)
Total Bilirubin: 0.6 mg/dL (ref 0.2–1.2)
Total Protein: 7.3 g/dL (ref 6.0–8.3)

## 2015-08-14 LAB — LIPID PANEL
Cholesterol: 168 mg/dL (ref 0–200)
HDL: 59.6 mg/dL (ref >39.00)
LDL CALC: 94 mg/dL (ref 0–99)
NonHDL: 108.46
Total CHOL/HDL Ratio: 3
Triglycerides: 70 mg/dL (ref 0.0–149.0)
VLDL: 14 mg/dL (ref 0.0–40.0)

## 2015-08-14 LAB — POCT URINALYSIS DIPSTICK
BILIRUBIN UA: NEGATIVE
Blood, UA: NEGATIVE
GLUCOSE UA: NEGATIVE
Leukocytes, UA: NEGATIVE
Nitrite, UA: NEGATIVE
Protein, UA: NEGATIVE
SPEC GRAV UA: 1.02
UROBILINOGEN UA: 1
pH, UA: 6.5

## 2015-08-14 NOTE — Patient Instructions (Signed)
Hypertension Hypertension, commonly called high blood pressure, is when the force of blood pumping through your arteries is too strong. Your arteries are the blood vessels that carry blood from your heart throughout your body. A blood pressure reading consists of a higher number over a lower number, such as 110/72. The higher number (systolic) is the pressure inside your arteries when your heart pumps. The lower number (diastolic) is the pressure inside your arteries when your heart relaxes. Ideally you want your blood pressure below 120/80. Hypertension forces your heart to work harder to pump blood. Your arteries may become narrow or stiff. Having untreated or uncontrolled hypertension can cause heart attack, stroke, kidney disease, and other problems. RISK FACTORS Some risk factors for high blood pressure are controllable. Others are not.  Risk factors you cannot control include:   Race. You may be at higher risk if you are African American.  Age. Risk increases with age.  Gender. Men are at higher risk than women before age 45 years. After age 65, women are at higher risk than men. Risk factors you can control include:  Not getting enough exercise or physical activity.  Being overweight.  Getting too much fat, sugar, calories, or salt in your diet.  Drinking too much alcohol. SIGNS AND SYMPTOMS Hypertension does not usually cause signs or symptoms. Extremely high blood pressure (hypertensive crisis) may cause headache, anxiety, shortness of breath, and nosebleed. DIAGNOSIS To check if you have hypertension, your health care provider will measure your blood pressure while you are seated, with your arm held at the level of your heart. It should be measured at least twice using the same arm. Certain conditions can cause a difference in blood pressure between your right and left arms. A blood pressure reading that is higher than normal on one occasion does not mean that you need treatment. If  it is not clear whether you have high blood pressure, you may be asked to return on a different day to have your blood pressure checked again. Or, you may be asked to monitor your blood pressure at home for 1 or more weeks. TREATMENT Treating high blood pressure includes making lifestyle changes and possibly taking medicine. Living a healthy lifestyle can help lower high blood pressure. You may need to change some of your habits. Lifestyle changes may include:  Following the DASH diet. This diet is high in fruits, vegetables, and whole grains. It is low in salt, red meat, and added sugars.  Keep your sodium intake below 2,300 mg per day.  Getting at least 30-45 minutes of aerobic exercise at least 4 times per week.  Losing weight if necessary.  Not smoking.  Limiting alcoholic beverages.  Learning ways to reduce stress. Your health care provider may prescribe medicine if lifestyle changes are not enough to get your blood pressure under control, and if one of the following is true:  You are 18-59 years of age and your systolic blood pressure is above 140.  You are 60 years of age or older, and your systolic blood pressure is above 150.  Your diastolic blood pressure is above 90.  You have diabetes, and your systolic blood pressure is over 140 or your diastolic blood pressure is over 90.  You have kidney disease and your blood pressure is above 140/90.  You have heart disease and your blood pressure is above 140/90. Your personal target blood pressure may vary depending on your medical conditions, your age, and other factors. HOME CARE INSTRUCTIONS    Have your blood pressure rechecked as directed by your health care provider.   Take medicines only as directed by your health care provider. Follow the directions carefully. Blood pressure medicines must be taken as prescribed. The medicine does not work as well when you skip doses. Skipping doses also puts you at risk for  problems.  Do not smoke.   Monitor your blood pressure at home as directed by your health care provider. SEEK MEDICAL CARE IF:   You think you are having a reaction to medicines taken.  You have recurrent headaches or feel dizzy.  You have swelling in your ankles.  You have trouble with your vision. SEEK IMMEDIATE MEDICAL CARE IF:  You develop a severe headache or confusion.  You have unusual weakness, numbness, or feel faint.  You have severe chest or abdominal pain.  You vomit repeatedly.  You have trouble breathing. MAKE SURE YOU:   Understand these instructions.  Will watch your condition.  Will get help right away if you are not doing well or get worse.   This information is not intended to replace advice given to you by your health care provider. Make sure you discuss any questions you have with your health care provider.   Document Released: 10/18/2005 Document Revised: 03/04/2015 Document Reviewed: 08/10/2013 Elsevier Interactive Patient Education 2016 [employer]  

## 2015-08-14 NOTE — Progress Notes (Signed)
Patient ID: Michael Pearson, male    DOB: [birth year]  Age: 54 y.o. MRN: [id number]    Subjective:  Subjective HPI Michael Pearson presents for c/o elevated bp and headaches.  Pt has no insurance so he has not seen any specialists / dr in a while-- because he can not afford co pay.  Headaches are waking him up at night.  He c/o of occasional chest pain.   no sob.   Pt also c/o painful spot on his R foot that has been there for a while.      Review of Systems  Constitutional: Negative.   HENT: Negative for congestion, ear pain, hearing loss, nosebleeds, postnasal drip, rhinorrhea, sinus pressure, sneezing and tinnitus.   Eyes: Negative for photophobia, discharge, itching and visual disturbance.  Respiratory: Negative.   Cardiovascular: Negative.   Gastrointestinal: Negative for abdominal pain, constipation, blood in stool, abdominal distention and anal bleeding.  Endocrine: Negative.   Genitourinary: Negative.   Musculoskeletal: Negative.   Skin: Negative.   Allergic/Immunologic: Negative.   Neurological: Negative for dizziness, weakness, light-headedness, numbness and headaches.  Psychiatric/Behavioral: Negative for suicidal ideas, confusion, sleep disturbance, dysphoric mood, decreased concentration and agitation. The patient is not nervous/anxious.     History Past Medical History  Diagnosis Date  . Migraines     Has been evaluated multiple times in past for chronic headache in which pt has had occasional nose bleeds and bloodshot eyes. This lasted for 4-5 months. CT of the head was negative in May 2012.  Michael Means ([place])     MRI in 2008 demonstrating 4.6 mm area of pituitary gland   . Duodenitis     May 2012 with heme positive stools at this time. Endorses only rare streaking of blood now.  . Colitis   . CAD (coronary artery disease)     NSTEMI 7/12:  Cardiac cath on 7/17: pLAD occluded, Dx 30-40%, pRCA 30-40%, mRCA 30-40%.  Proximal LAD was treated with a BMS.  Echo 7/19:  EF  60-65%, mild LVH.    ETT-Myoview 6/14:  Inf thinning, no ischemia, EF 64%, normal study  . RBBB (right bundle branch block)     Noted on EKG in 2008  . Kidney stones   . Hyperlipidemia   . Hypertension   . Myocardial infarction ([place]) 05/17/2011  . Allergy   . Asthma   . Hiatal hernia   . Duodenitis   . Ulcer   . Hx of hemorrhoids   . GERD (gastroesophageal reflux disease)   . Arthritis   . Heart murmur     as a child per the pt    He has past surgical history that includes Hernia repair; Tooth extraction; Hammer toe surgery; egd (03/05/2011); Flexible sigmoidoscopy (03/05/2011); and Heart stent.   His family history includes Colon cancer in his maternal aunt; Colon polyps in his maternal aunt and paternal grandmother; Coronary artery disease in his brother; Heart attack (age of onset: 82) in his father; Hypertension in his brother; Obesity in his brother; Skin cancer in his father; Stroke in his mother.He reports that he has never smoked. He has never used smokeless tobacco. He reports that he does not drink alcohol or use illicit drugs.  Current Outpatient Prescriptions on File Prior to Visit  Medication Sig Dispense Refill  . acetaminophen (TYLENOL) 325 MG tablet Take 650 mg by mouth 2 (two) times daily as needed.      Marland Kitchen albuterol (PROVENTIL HFA;VENTOLIN HFA) 108 (90 BASE) MCG/ACT inhaler  Inhale 2 puffs into the lungs every 4 (four) hours as needed for wheezing or shortness of breath.    Marland Kitchen aspirin 81 MG tablet Take 81 mg by mouth daily.      Marland Kitchen atorvastatin (LIPITOR) 40 MG tablet Take 1 tablet (40 mg total) by mouth daily. 30 tablet 9  . beclomethasone (QVAR) 40 MCG/ACT inhaler Inhale 2 puffs into the lungs 2 (two) times daily. 1 Inhaler 12  . esomeprazole (NEXIUM) 40 MG packet Take 40 mg by mouth daily before breakfast. 30 each 12  . losartan (COZAAR) 50 MG tablet TAKE 1 TABLET BY MOUTH DAILY. OFFICE VISIT DUE NOW 14 tablet 0  . metoprolol tartrate (LOPRESSOR) 25 MG tablet TAKE 1/2  TABLET BY MOUTH TWICE A DAY 30 tablet 1  . mometasone (NASONEX) 50 MCG/ACT nasal spray Place 2 sprays into the nose daily. 17 g 2  . Multiple Vitamin (MULTIVITAMIN) tablet Take 1 tablet by mouth daily.      . nitroGLYCERIN (NITROSTAT) 0.4 MG SL tablet Place 0.4 mg under the tongue every 5 (five) minutes as needed.      . ranitidine (ZANTAC) 150 MG tablet Take 1 tablet (150 mg total) by mouth 2 (two) times daily. 60 tablet 5   No current facility-administered medications on file prior to visit.     Objective:  Objective Physical Exam  Constitutional: He is oriented to person, place, and time. Vital signs are normal. He appears well-developed and well-nourished. He is sleeping.  HENT:  Head: Normocephalic and atraumatic.  Mouth/Throat: Oropharynx is clear and moist.  Eyes: EOM are normal. Pupils are equal, round, and reactive to light.  Neck: Normal range of motion. Neck supple. No thyromegaly present.  Cardiovascular: Normal rate and regular rhythm.   No murmur heard. Pulmonary/Chest: Effort normal and breath sounds normal. No respiratory distress. He has no wheezes. He has no rales. He exhibits no tenderness.  Musculoskeletal: He exhibits no edema or tenderness.  Neurological: He is alert and oriented to person, place, and time.  Skin: Skin is warm and dry.  Psychiatric: He has a normal mood and affect. His behavior is normal. Judgment and thought content normal.   BP 120/78 mmHg  Pulse 57  Temp(Src) 98 F (36.7 C) (Oral)  Ht _0  (1.905 m)  Wt 227 lb 6.4 oz (103.148 kg)  BMI 28.42 kg/m2  SpO2 98% Wt Readings from Last 3 Encounters:  08/14/15 227 lb 6.4 oz (103.148 kg)  10/01/14 221 lb 9.6 oz (100.517 kg)  09/17/14 220 lb 12.8 oz (100.154 kg)     Lab Results  Component Value Date   WBC 8.9 08/14/2015   HGB 14.2 08/14/2015   HCT 42.4 08/14/2015   PLT 224.0 08/14/2015   GLUCOSE 99 08/14/2015   CHOL 168 08/14/2015   TRIG 70.0 08/14/2015   HDL 59.60 08/14/2015    LDLCALC 94 08/14/2015   ALT 21 08/14/2015   AST 18 08/14/2015   NA 139 08/14/2015   K 3.9 08/14/2015   CL 100 08/14/2015   CREATININE 0.80 08/14/2015   BUN 10 08/14/2015   CO2 32 08/14/2015   TSH 0.98 11/03/2012   PSA 0.26 11/03/2012   INR 0.97 05/17/2011   HGBA1C 5.8 09/17/2014   MICROALBUR 1.1 09/17/2014    Ct Renal Stone Study  10/02/2014  CLINICAL DATA:  Mid to lower abdominal pain since Saturday night. Nausea and difficulty urinating. EXAM: CT ABDOMEN AND PELVIS WITHOUT CONTRAST TECHNIQUE: Multidetector CT imaging of the abdomen and pelvis  was performed following the standard protocol without IV contrast. COMPARISON:  CT scan 02/16/2013 FINDINGS: Lower chest: The lung bases are clear of acute process. No pleural effusion or pulmonary lesions. The heart is normal in size. No pericardial effusion. The distal esophagus and aorta are unremarkable. Hepatobiliary: Normal. Pancreas: Normal. Spleen: Normal. Adrenals/Urinary Tract: The adrenal glands and kidneys are normal. No renal or obstructing ureteral calculi or bladder calculi. The bladder, prostate gland and seminal vesicles are unremarkable. Stomach/Bowel: The stomach, duodenum and small bowel are unremarkable. The terminal ileum is normal. The appendix is normal. There is wall thickening and inflammation involving the mid sigmoid colon suggesting acute diverticulitis. No complicating features such as abscess or free air. Vascular/Lymphatic: No mesenteric or retroperitoneal mass or adenopathy. Small scattered lymph nodes are noted. The aorta is normal in caliber. Pelvis: No pelvic mass, adenopathy or free pelvic fluid collections. No inguinal mass or adenopathy. Musculoskeletal: Degenerative anterolisthesis of L5 with advanced degenerative disc disease and facet disease. No acute bony findings. IMPRESSION: 1. Acute uncomplicated sigmoid diverticulitis. 2. No renal or obstructing ureteral calculi or bladder calculi. Electronically Signed   By:  Kalman Jewels M.D.   On: 10/02/2014 15:27     Assessment & Plan:  Plan I am having Michael Pearson maintain his nitroGLYCERIN, aspirin, acetaminophen, multivitamin, beclomethasone, mometasone, atorvastatin, ranitidine, esomeprazole, albuterol, metoprolol tartrate, and losartan.  No orders of the defined types were placed in this encounter.    Problem List Items Addressed This Visit    HTN (hypertension)    Stable Normal today Pt to bring in his cuff so we can check it      Headache    bp running high per pt when this happens--- ? Sleep apnea bp normal in office Check labs Consider sleep study-- pt not able to afford specialty copay      Relevant Orders   Comp Met (CMET) (Completed)   CBC with Differential/Platelet (Completed)   Lipid panel (Completed)   POCT urinalysis dipstick (Completed)    Other Visit Diagnoses    Other headache syndrome    -  Primary    Relevant Orders    Comp Met (CMET) (Completed)    CBC with Differential/Platelet (Completed)    Lipid panel (Completed)    POCT urinalysis dipstick (Completed)    Other chest pain        Relevant Orders    EKG 12-Lead (Completed)    Plantar wart, right foot        Relevant Orders    Ambulatory referral to Podiatry    Need for immunization against influenza        Relevant Orders    Flu Vaccine QUAD 36+ mos IM (Fluarix) (Completed)       Follow-up: Return in about 3 months (around 11/14/2015), or if symptoms worsen or fail to improve, for annual exam, fasting.  Michael Koyanagi, DO

## 2015-08-14 NOTE — Progress Notes (Signed)
Pre visit review using our clinic review tool, if applicable. No additional management support is needed unless otherwise documented below in the visit note. 

## 2015-08-14 NOTE — Assessment & Plan Note (Signed)
bp running high per pt when this happens--- ? Sleep apnea bp normal in office Check labs Consider sleep study-- pt not able to afford specialty copay

## 2015-08-14 NOTE — Assessment & Plan Note (Signed)
Stable Normal today Pt to bring in his cuff so we can check it

## 2015-08-20 ENCOUNTER — Ambulatory Visit: Payer: Self-pay | Admitting: Podiatry

## 2015-08-23 ENCOUNTER — Other Ambulatory Visit: Payer: Self-pay | Admitting: Family Medicine

## 2015-09-22 ENCOUNTER — Other Ambulatory Visit: Payer: Self-pay | Admitting: Cardiovascular Disease

## 2015-11-20 ENCOUNTER — Encounter: Payer: Self-pay | Admitting: Family Medicine

## 2015-11-20 ENCOUNTER — Ambulatory Visit (INDEPENDENT_AMBULATORY_CARE_PROVIDER_SITE_OTHER): Payer: Self-pay | Admitting: Family Medicine

## 2015-11-20 VITALS — BP 118/80 | HR 68 | Temp 98.0°F | Ht 75.0 in | Wt 228.8 lb

## 2015-11-20 DIAGNOSIS — R195 Other fecal abnormalities: Secondary | ICD-10-CM

## 2015-11-20 DIAGNOSIS — K219 Gastro-esophageal reflux disease without esophagitis: Secondary | ICD-10-CM

## 2015-11-20 DIAGNOSIS — Z1211 Encounter for screening for malignant neoplasm of colon: Secondary | ICD-10-CM

## 2015-11-20 DIAGNOSIS — J4520 Mild intermittent asthma, uncomplicated: Secondary | ICD-10-CM

## 2015-11-20 DIAGNOSIS — E785 Hyperlipidemia, unspecified: Secondary | ICD-10-CM

## 2015-11-20 DIAGNOSIS — I214 Non-ST elevation (NSTEMI) myocardial infarction: Secondary | ICD-10-CM

## 2015-11-20 DIAGNOSIS — I10 Essential (primary) hypertension: Secondary | ICD-10-CM

## 2015-11-20 DIAGNOSIS — R112 Nausea with vomiting, unspecified: Secondary | ICD-10-CM

## 2015-11-20 DIAGNOSIS — Z1159 Encounter for screening for other viral diseases: Secondary | ICD-10-CM

## 2015-11-20 DIAGNOSIS — Z1212 Encounter for screening for malignant neoplasm of rectum: Secondary | ICD-10-CM

## 2015-11-20 MED ORDER — ESOMEPRAZOLE MAGNESIUM 40 MG PO PACK: 40.0000 mg | PACK | Freq: Every day | ORAL | Status: DC

## 2015-11-20 NOTE — Progress Notes (Signed)
Patient ID: Michael Pearson, male    DOB: [birth year]  Age: 55 y.o. MRN: [id number]    Subjective:  Subjective HPI Michael Pearson presents for cpe and labs.  C/o of periodic N/V--- he is taking the zantac but not the nexium.  No fever , no diarrhea.  He has also not had f/u with cardiology.  He said he thought they were going to call him.  No chest pain, sob.    Review of Systems  Constitutional: Negative for diaphoresis, appetite change, fatigue and unexpected weight change.  Eyes: Negative for pain, redness and visual disturbance.  Respiratory: Negative for cough, chest tightness, shortness of breath and wheezing.   Cardiovascular: Negative for chest pain, palpitations and leg swelling.  Endocrine: Negative for cold intolerance, heat intolerance, polydipsia, polyphagia and polyuria.  Genitourinary: Negative for dysuria, frequency and difficulty urinating.  Neurological: Negative for dizziness, light-headedness, numbness and headaches.    History Past Medical History  Diagnosis Date  . Migraines     Has been evaluated multiple times in past for chronic headache in which pt has had occasional nose bleeds and bloodshot eyes. This lasted for 4-5 months. CT of the head was negative in May 2012.  Nanine Means ([place])     MRI in 2008 demonstrating 4.6 mm area of pituitary gland   . Duodenitis     May 2012 with heme positive stools at this time. Endorses only rare streaking of blood now.  . Colitis   . CAD (coronary artery disease)     NSTEMI 7/12:  Cardiac cath on 7/17: pLAD occluded, Dx 30-40%, pRCA 30-40%, mRCA 30-40%.  Proximal LAD was treated with a BMS.  Echo 7/19:  EF 60-65%, mild LVH.    ETT-Myoview 6/14:  Inf thinning, no ischemia, EF 64%, normal study  . RBBB (right bundle branch block)     Noted on EKG in 2008  . Kidney stones   . Hyperlipidemia   . Hypertension   . Myocardial infarction ([place]) 05/17/2011  . Allergy   . Asthma   . Hiatal hernia   . Duodenitis   . Ulcer   . Hx of  hemorrhoids   . GERD (gastroesophageal reflux disease)   . Arthritis   . Heart murmur     as a child per the pt    He has past surgical history that includes Hernia repair; Tooth extraction; Hammer toe surgery; egd (03/05/2011); Flexible sigmoidoscopy (03/05/2011); and Heart stent.   His family history includes Colon cancer in his maternal aunt; Colon polyps in his maternal aunt and paternal grandmother; Coronary artery disease in his brother; Heart attack (age of onset: 61) in his father; Hypertension in his brother; Obesity in his brother; Skin cancer in his father; Stroke in his mother.He reports that he has never smoked. He has never used smokeless tobacco. He reports that he does not drink alcohol or use illicit drugs.  Current Outpatient Prescriptions on File Prior to Visit  Medication Sig Dispense Refill  . acetaminophen (TYLENOL) 325 MG tablet Take 650 mg by mouth 2 (two) times daily as needed.      Marland Kitchen albuterol (PROVENTIL HFA;VENTOLIN HFA) 108 (90 BASE) MCG/ACT inhaler Inhale 2 puffs into the lungs every 4 (four) hours as needed for wheezing or shortness of breath.    Marland Kitchen aspirin 81 MG tablet Take 81 mg by mouth daily.      Marland Kitchen atorvastatin (LIPITOR) 40 MG tablet Take 1 tablet (40 mg total) by mouth daily. Brush  tablet 9  . beclomethasone (QVAR) 40 MCG/ACT inhaler Inhale 2 puffs into the lungs 2 (two) times daily. 1 Inhaler 12  . losartan (COZAAR) 50 MG tablet Take 1 tablet (50 mg total) by mouth daily. 30 tablet 5  . metoprolol tartrate (LOPRESSOR) 25 MG tablet TAKE 1/2 TABLET BY MOUTH TWICE A DAY 30 tablet 0  . mometasone (NASONEX) 50 MCG/ACT nasal spray Place 2 sprays into the nose daily. 17 g 2  . Multiple Vitamin (MULTIVITAMIN) tablet Take 1 tablet by mouth daily.      . nitroGLYCERIN (NITROSTAT) 0.4 MG SL tablet Place 0.4 mg under the tongue every 5 (five) minutes as needed.      . ranitidine (ZANTAC) 150 MG tablet Take 1 tablet (150 mg total) by mouth 2 (two) times daily. 60 tablet 5    No current facility-administered medications on file prior to visit.     Objective:  Objective Physical Exam  Constitutional: He is oriented to person, place, and time. Vital signs are normal. He appears well-developed and well-nourished. He is sleeping.  HENT:  Head: Normocephalic and atraumatic.  Mouth/Throat: Oropharynx is clear and moist.  Eyes: EOM are normal. Pupils are equal, round, and reactive to light.  Neck: Normal range of motion. Neck supple. No thyromegaly present.  Cardiovascular: Normal rate and regular rhythm.   No murmur heard. Pulmonary/Chest: Effort normal and breath sounds normal. No respiratory distress. He has no wheezes. He has no rales. He exhibits no tenderness.  Musculoskeletal: He exhibits no edema or tenderness.  Neurological: He is alert and oriented to person, place, and time.  Skin: Skin is warm and dry.  Psychiatric: He has a normal mood and affect. His behavior is normal. Judgment and thought content normal.  Nursing note and vitals reviewed.  BP 118/80 mmHg  Pulse 68  Temp(Src) 98 F (36.7 C) (Oral)  Ht '6\' 3"'  (1.905 m)  Wt 228 lb 12.8 oz (103.783 kg)  BMI 28.60 kg/m2  SpO2 97% Wt Readings from Last 3 Encounters:  11/20/15 228 lb 12.8 oz (103.783 kg)  08/14/15 227 lb 6.4 oz (103.148 kg)  10/01/14 221 lb 9.6 oz (100.517 kg)     Lab Results  Component Value Date   WBC 8.9 08/14/2015   HGB 14.2 08/14/2015   HCT 42.4 08/14/2015   PLT 224.0 08/14/2015   GLUCOSE 99 08/14/2015   CHOL 168 08/14/2015   TRIG 70.0 08/14/2015   HDL 59.60 08/14/2015   LDLCALC 94 08/14/2015   ALT 21 08/14/2015   AST 18 08/14/2015   NA 139 08/14/2015   K 3.9 08/14/2015   CL 100 08/14/2015   CREATININE 0.80 08/14/2015   BUN 10 08/14/2015   CO2 32 08/14/2015   TSH 0.98 11/03/2012   PSA 0.26 11/03/2012   INR 0.97 05/17/2011   HGBA1C 5.8 09/17/2014   MICROALBUR 1.1 09/17/2014    Ct Renal Stone Study  10/02/2014  CLINICAL DATA:  Mid to lower abdominal  pain since Saturday night. Nausea and difficulty urinating. EXAM: CT ABDOMEN AND PELVIS WITHOUT CONTRAST TECHNIQUE: Multidetector CT imaging of the abdomen and pelvis was performed following the standard protocol without IV contrast. COMPARISON:  CT scan 02/16/2013 FINDINGS: Lower chest: The lung bases are clear of acute process. No pleural effusion or pulmonary lesions. The heart is normal in size. No pericardial effusion. The distal esophagus and aorta are unremarkable. Hepatobiliary: Normal. Pancreas: Normal. Spleen: Normal. Adrenals/Urinary Tract: The adrenal glands and kidneys are normal. No renal or obstructing ureteral  calculi or bladder calculi. The bladder, prostate gland and seminal vesicles are unremarkable. Stomach/Bowel: The stomach, duodenum and small bowel are unremarkable. The terminal ileum is normal. The appendix is normal. There is wall thickening and inflammation involving the mid sigmoid colon suggesting acute diverticulitis. No complicating features such as abscess or free air. Vascular/Lymphatic: No mesenteric or retroperitoneal mass or adenopathy. Small scattered lymph nodes are noted. The aorta is normal in caliber. Pelvis: No pelvic mass, adenopathy or free pelvic fluid collections. No inguinal mass or adenopathy. Musculoskeletal: Degenerative anterolisthesis of L5 with advanced degenerative disc disease and facet disease. No acute bony findings. IMPRESSION: 1. Acute uncomplicated sigmoid diverticulitis. 2. No renal or obstructing ureteral calculi or bladder calculi. Electronically Signed   By: Kalman Jewels M.D.   On: 10/02/2014 15:27     Assessment & Plan:  Plan I am having Michael Pearson maintain his nitroGLYCERIN, aspirin, acetaminophen, multivitamin, beclomethasone, mometasone, atorvastatin, ranitidine, albuterol, losartan, metoprolol tartrate, and esomeprazole.  Meds ordered this encounter  Medications  . esomeprazole (NEXIUM) 40 MG packet    Sig: Take 40 mg by mouth daily  before breakfast.    Dispense:  30 each    Refill:  12    Samples given to patient    Lot#                   Exp.                  Amount    Problem List Items Addressed This Visit      Unprioritized   Mild intermittent asthma   Relevant Orders   Ambulatory referral to Pulmonology   HTN (hypertension)   Relevant Orders   Comp Met (CMET)   CBC with Differential/Platelet   Lipid panel   POCT urinalysis dipstick   Microalbumin / creatinine urine ratio   Heme positive stool   Relevant Medications   esomeprazole (NEXIUM) 40 MG packet    Other Visit Diagnoses    Hyperlipidemia    -  Primary    Relevant Orders    Comp Met (CMET)    CBC with Differential/Platelet    Lipid panel    POCT urinalysis dipstick    Microalbumin / creatinine urine ratio    NSTEMI (non-ST elevated myocardial infarction) [facility])        Relevant Orders    Ambulatory referral to Cardiology    Need for hepatitis C screening test        Relevant Orders    Hepatitis C antibody    Gastroesophageal reflux disease, esophagitis presence not specified        Relevant Medications    esomeprazole ([place]) 40 MG packet    Other Relevant Orders    Ambulatory referral to Gastroenterology    Nausea and vomiting, intractability of vomiting not specified, unspecified vomiting type        Relevant Orders    Ambulatory referral to Gastroenterology    Screening for colorectal cancer        Relevant Medications    esomeprazole ([place]) 40 MG packet       Follow-up: Return in about 6 months (around 05/19/2016), or if symptoms worsen or fail to improve, for hypertension, hyperlipidemia.  Garnet Koyanagi, DO

## 2015-11-20 NOTE — Progress Notes (Signed)
Pre visit review using our clinic review tool, if applicable. No additional management support is needed unless otherwise documented below in the visit note. 

## 2015-11-20 NOTE — Patient Instructions (Signed)

## 2015-11-21 LAB — CBC WITH DIFFERENTIAL/PLATELET
BASOS PCT: 0.7 % (ref 0.0–3.0)
Basophils Absolute: 0.1 10*3/uL (ref 0.0–0.1)
EOS ABS: 0.2 10*3/uL (ref 0.0–0.7)
Eosinophils Relative: 3.3 % (ref 0.0–5.0)
HEMATOCRIT: 45.6 % (ref 39.0–52.0)
HEMOGLOBIN: 14.9 g/dL (ref 13.0–17.0)
LYMPHS PCT: 30.9 % (ref 12.0–46.0)
Lymphs Abs: 2.3 10*3/uL (ref 0.7–4.0)
MCHC: 32.6 g/dL (ref 30.0–36.0)
MCV: 95.3 fl (ref 78.0–100.0)
MONOS PCT: 7.2 % (ref 3.0–12.0)
Monocytes Absolute: 0.5 10*3/uL (ref 0.1–1.0)
Neutro Abs: 4.4 10*3/uL (ref 1.4–7.7)
Neutrophils Relative %: 57.9 % (ref 43.0–77.0)
Platelets: 235 10*3/uL (ref 150.0–400.0)
RBC: 4.79 Mil/uL (ref 4.22–5.81)
RDW: 14 % (ref 11.5–15.5)
WBC: 7.6 10*3/uL (ref 4.0–10.5)

## 2015-11-21 LAB — LIPID PANEL
CHOL/HDL RATIO: 4
Cholesterol: 198 mg/dL (ref 0–200)
HDL: 56.2 mg/dL (ref >39.00)
LDL CALC: 131 mg/dL — AB (ref 0–99)
NONHDL: 141.85
TRIGLYCERIDES: 56 mg/dL (ref 0.0–149.0)
VLDL: 11.2 mg/dL (ref 0.0–40.0)

## 2015-11-21 LAB — COMPREHENSIVE METABOLIC PANEL
ALBUMIN: 4.5 g/dL (ref 3.5–5.2)
ALT: 21 U/L (ref 0–53)
AST: 20 U/L (ref 0–37)
Alkaline Phosphatase: 55 U/L (ref 39–117)
BUN: 13 mg/dL (ref 6–23)
CALCIUM: 9.9 mg/dL (ref 8.4–10.5)
CHLORIDE: 102 meq/L (ref 96–112)
CO2: 27 meq/L (ref 19–32)
Creatinine, Ser: 0.84 mg/dL (ref 0.40–1.50)
GFR: 100.87 mL/min (ref >60.00)
Glucose, Bld: 85 mg/dL (ref 70–99)
Potassium: 3.9 mEq/L (ref 3.5–5.1)
Sodium: 138 mEq/L (ref 135–145)
Total Bilirubin: 0.7 mg/dL (ref 0.2–1.2)
Total Protein: 7.4 g/dL (ref 6.0–8.3)

## 2015-11-21 LAB — MICROALBUMIN / CREATININE URINE RATIO
Creatinine,U: 279.3 mg/dL
Microalb Creat Ratio: 0.5 mg/g (ref 0.0–30.0)
Microalb, Ur: 1.5 mg/dL (ref 0.0–1.9)

## 2015-11-21 LAB — HEPATITIS C ANTIBODY: HCV AB: NEGATIVE

## 2015-12-01 ENCOUNTER — Other Ambulatory Visit: Payer: Self-pay

## 2015-12-01 MED ORDER — EZETIMIBE 10 MG PO TABS: 10.0000 mg | ORAL_TABLET | Freq: Every day | ORAL | Status: DC

## 2015-12-04 ENCOUNTER — Other Ambulatory Visit: Payer: Self-pay | Admitting: Cardiovascular Disease

## 2015-12-04 ENCOUNTER — Ambulatory Visit (INDEPENDENT_AMBULATORY_CARE_PROVIDER_SITE_OTHER): Payer: Self-pay | Admitting: Internal Medicine

## 2015-12-04 ENCOUNTER — Encounter: Payer: Self-pay | Admitting: Internal Medicine

## 2015-12-04 VITALS — BP 122/74 | HR 77 | Ht 74.0 in | Wt 230.0 lb

## 2015-12-04 DIAGNOSIS — K219 Gastro-esophageal reflux disease without esophagitis: Secondary | ICD-10-CM

## 2015-12-04 DIAGNOSIS — R058 Other specified cough: Secondary | ICD-10-CM

## 2015-12-04 DIAGNOSIS — R05 Cough: Secondary | ICD-10-CM

## 2015-12-04 NOTE — Progress Notes (Signed)
   Subjective:    Patient ID: Michael Pearson, male    DOB: 1960-12-23,     MRN: [id number]  HPI  66 yowm never smoker exposed to battery acid and required 02 for a few days around 2003/4 with sensitivity ever since  to fumes/ dusts >>> cough to vomit / sob so referred to pulmonary clinic 12/04/2015 by Dr Etter Sjogren.   12/04/2015 1st [facility] office visit/ Travian Kerner   Chief Complaint  Patient presents with  . Pulmonary Consult    Referred by Dr. Etter Sjogren for eval of asthma. He c/o heartburn and severe headaches. He states he only occ has SOB- "If I do alot of stuff around the house".   all it takes is bending over p bfrast and starts "vomiting acid"  X years plus doe x MMRC1 = can walk nl pace, flat grade, can't hurry or go uphills or steps s sob Has seen GI for lower GI symptoms but not gerd. No better on nexium and zantac   No obvious other patterns in day to day or daytime variabilty or assoc chronic cough or cp or chest tightness, subjective wheeze or overt sinus  symptoms. No unusual exp hx or h/o childhood pna/ asthma or knowledge of premature birth.  Sleeping ok without nocturnal  or early am exacerbation  of respiratory  c/o's or need for noct saba. Also denies any obvious fluctuation of symptoms with weather or environmental changes or other aggravating or alleviating factors except as outlined above   Current Medications, Allergies, Complete Past Medical History, Past Surgical History, Family History, and Social History were reviewed in [employer] record.          Review of Systems  Constitutional: Negative for fever, chills, activity change, appetite change and unexpected weight change.  HENT: Negative for congestion, dental problem, postnasal drip, rhinorrhea, sneezing, sore throat, trouble swallowing and voice change.   Eyes: Negative for visual disturbance.  Respiratory: Positive for shortness of breath. Negative for cough and choking.   Cardiovascular: Negative  for chest pain and leg swelling.  Gastrointestinal: Negative for nausea, vomiting and abdominal pain.  Genitourinary: Negative for difficulty urinating.       Acid heartburn  Musculoskeletal: Negative for arthralgias.  Skin: Negative for rash.  Neurological: Positive for headaches.  Psychiatric/Behavioral: Negative for behavioral problems and confusion.       Objective:   Physical Exam  amb wm nad  Wt Readings from Last 3 Encounters:  12/04/15 230 lb (104.327 kg)  11/20/15 228 lb 12.8 oz (103.783 kg)  08/14/15 227 lb 6.4 oz (103.148 kg)    Vital signs reviewed   HEENT: nl dentition, turbinates, and oropharynx. Nl external ear canals without cough reflex   NECK :  without JVD/Nodes/TM/ nl carotid upstrokes bilaterally   LUNGS: no acc muscle use,  Nl contour chest which is clear to A and P bilaterally without cough on insp or exp maneuvers   CV:  RRR  no s3 or murmur or increase in P2, no edema   ABD:  soft and nontender with nl inspiratory excursion in the supine position. No bruits or organomegaly, bowel sounds nl  MS:  Nl gait/ ext warm without deformities, calf tenderness, cyanosis or clubbing No obvious joint restrictions   SKIN: warm and dry without lesions    NEURO:  alert, approp, nl sensorium with  no motor deficits             Assessment & Plan:

## 2015-12-04 NOTE — Patient Instructions (Addendum)
Please see patient coordinator before you leave today  to schedule GI evaluation by Dr Hilarie Fredrickson for bad reflux   GERD (REFLUX)  is an extremely common cause of respiratory symptoms just like yours , many times with no obvious heartburn at all.    It can be treated with medication, but also with lifestyle changes including elevation of the head of your bed (ideally with 6 inch  bed blocks),  Smoking cessation, avoidance of late meals, excessive alcohol, and avoid fatty foods, chocolate, peppermint, colas, red wine, and acidic juices such as orange juice.  NO MINT OR MENTHOL PRODUCTS SO NO COUGH DROPS  USE SUGARLESS CANDY INSTEAD (Jolley ranchers or Stover's or Life Savers) or even ice chips will also do - the key is to swallow to prevent all throat clearing. NO OIL BASED VITAMINS - use powdered substitutes.  Ok to hold qvar for now and just use your rescue inhaler and unless your rescue inhaler need gets worse then no need to start back on qvar  If breathing not better after GI evaluation need to return here

## 2015-12-04 NOTE — Telephone Encounter (Signed)
Pt was last seen by Cardiology in April 2015.  The pt recently saw PCP 11/20/2015.  Please forward refill to Dr Etter Sjogren to refill. Our office left the pt a voicemail on 11/27/15 to contact Cardiology for an office visit.

## 2015-12-05 ENCOUNTER — Encounter: Payer: Self-pay | Admitting: Internal Medicine

## 2015-12-05 NOTE — Assessment & Plan Note (Addendum)
pfts while symptomatic chronicall 08/31/11 wnl with abn f/v contour and no reduction if [id number]  - rec trial off qvar 12/04/2015 >>>  It is possible this is asthma but if so it's very mild and the qvar has not helped his main symptoms which turn out to be directly related to gerd (see separate a/p)    Classic Upper airway cough syndrome, so named because it's frequently impossible to sort out how much is  CR/sinusitis with freq throat clearing (which can be related to primary GERD)   vs  causing  secondary (" extra esophageal")  GERD from wide swings in gastric pressure that occur with throat clearing, often  promoting self use of mint and menthol lozenges that reduce the lower esophageal sphincter tone and exacerbate the problem further in a cyclical fashion.   These are the same pts (now being labeled as having "irritable larynx syndrome" (which is strongly suggested by his reproducible symptoms with certain exposures esp fumes but not cold air/ exercise/ viral triggers/ nocturnal events) by some cough centers) who not infrequently have a history of having failed to tolerate ace inhibitors,  dry powder inhalers or biphosphonates or report having atypical reflux symptoms that don't respond to standard doses of PPI , and are easily confused as having aecopd or asthma flares by even experienced allergists/ pulmonologists.   rec reverse therapeutic trial since he says nothing we've done has helped: try off qvar and refer to gi then return here p this if symptoms persist  Total time devoted to counseling  = 35/55m review case with pt/ discussion of options/alternatives/ personally creating in presence of pt  then going over specific  Instructions directly with the pt including how to use all of the meds but in particular covering each new medication in detail (see avs)

## 2015-12-05 NOTE — Assessment & Plan Note (Signed)
Referred to GI 12/04/2015 >>>  Also given extensive lifestyle/ diet instructions - see avs

## 2015-12-12 ENCOUNTER — Encounter: Payer: Self-pay | Admitting: Cardiovascular Disease

## 2015-12-28 ENCOUNTER — Encounter (HOSPITAL_COMMUNITY): Payer: Self-pay | Admitting: Nurse Practitioner

## 2015-12-28 ENCOUNTER — Emergency Department (HOSPITAL_COMMUNITY): Payer: Self-pay

## 2015-12-28 ENCOUNTER — Emergency Department (HOSPITAL_COMMUNITY)
Admission: EM | Admit: 2015-12-28 | Discharge: 2015-12-28 | Disposition: A | Discharge status: Home / Self Care | Payer: Self-pay | Attending: Emergency Medicine | Admitting: Emergency Medicine

## 2015-12-28 DIAGNOSIS — S61210A Laceration without foreign body of right index finger without damage to nail, initial encounter: Secondary | ICD-10-CM | POA: Insufficient documentation

## 2015-12-28 DIAGNOSIS — Z8719 Personal history of other diseases of the digestive system: Secondary | ICD-10-CM | POA: Insufficient documentation

## 2015-12-28 DIAGNOSIS — I2510 Atherosclerotic heart disease of native coronary artery without angina pectoris: Secondary | ICD-10-CM | POA: Insufficient documentation

## 2015-12-28 DIAGNOSIS — Z87442 Personal history of urinary calculi: Secondary | ICD-10-CM | POA: Insufficient documentation

## 2015-12-28 DIAGNOSIS — Z86018 Personal history of other benign neoplasm: Secondary | ICD-10-CM | POA: Insufficient documentation

## 2015-12-28 DIAGNOSIS — Z23 Encounter for immunization: Secondary | ICD-10-CM | POA: Insufficient documentation

## 2015-12-28 DIAGNOSIS — Z79899 Other long term (current) drug therapy: Secondary | ICD-10-CM | POA: Insufficient documentation

## 2015-12-28 DIAGNOSIS — S61219A Laceration without foreign body of unspecified finger without damage to nail, initial encounter: Secondary | ICD-10-CM

## 2015-12-28 DIAGNOSIS — R011 Cardiac murmur, unspecified: Secondary | ICD-10-CM | POA: Insufficient documentation

## 2015-12-28 DIAGNOSIS — E785 Hyperlipidemia, unspecified: Secondary | ICD-10-CM | POA: Insufficient documentation

## 2015-12-28 DIAGNOSIS — Y998 Other external cause status: Secondary | ICD-10-CM | POA: Insufficient documentation

## 2015-12-28 DIAGNOSIS — I252 Old myocardial infarction: Secondary | ICD-10-CM | POA: Insufficient documentation

## 2015-12-28 DIAGNOSIS — J45909 Unspecified asthma, uncomplicated: Secondary | ICD-10-CM | POA: Insufficient documentation

## 2015-12-28 DIAGNOSIS — W293XXA Contact with powered garden and outdoor hand tools and machinery, initial encounter: Secondary | ICD-10-CM | POA: Insufficient documentation

## 2015-12-28 DIAGNOSIS — I10 Essential (primary) hypertension: Secondary | ICD-10-CM | POA: Insufficient documentation

## 2015-12-28 DIAGNOSIS — Y9289 Other specified places as the place of occurrence of the external cause: Secondary | ICD-10-CM | POA: Insufficient documentation

## 2015-12-28 DIAGNOSIS — M1990 Unspecified osteoarthritis, unspecified site: Secondary | ICD-10-CM | POA: Insufficient documentation

## 2015-12-28 DIAGNOSIS — Z7982 Long term (current) use of aspirin: Secondary | ICD-10-CM | POA: Insufficient documentation

## 2015-12-28 DIAGNOSIS — Y9389 Activity, other specified: Secondary | ICD-10-CM | POA: Insufficient documentation

## 2015-12-28 MED ORDER — BACITRACIN ZINC 500 UNIT/GM EX OINT
TOPICAL_OINTMENT | Freq: Two times a day (BID) | CUTANEOUS | Status: DC
  Administered 2015-12-28: 1 via TOPICAL
  Filled 2015-12-28: qty 0.9

## 2015-12-28 MED ORDER — BUPIVACAINE HCL (PF) 0.5 % IJ SOLN: 10.0000 mL | Freq: Once | INTRAMUSCULAR | Status: DC

## 2015-12-28 MED ORDER — IBUPROFEN 800 MG PO TABS: 800.0000 mg | ORAL_TABLET | Freq: Three times a day (TID) | ORAL | Status: DC

## 2015-12-28 MED ORDER — CEPHALEXIN 500 MG PO CAPS
500.0000 mg | ORAL_CAPSULE | Freq: Four times a day (QID) | ORAL | Status: DC
Start: 2015-12-28 — End: 2016-01-09

## 2015-12-28 MED ORDER — TETANUS-DIPHTH-ACELL PERTUSSIS 5-2.5-18.5 LF-MCG/0.5 IM SUSP
0.5000 mL | Freq: Once | INTRAMUSCULAR | Status: AC
  Administered 2015-12-28: 0.5 mL via INTRAMUSCULAR
  Filled 2015-12-28: qty 0.5

## 2015-12-28 MED ORDER — BUPIVACAINE HCL (PF) 0.5 % IJ SOLN
10.0000 mL | Freq: Once | INTRAMUSCULAR | Status: AC
  Administered 2015-12-28: 10 mL
  Filled 2015-12-28: qty 10

## 2015-12-28 NOTE — ED Notes (Signed)
Declined W/C at D/C and was escorted to lobby by RN. 

## 2015-12-28 NOTE — Discharge Instructions (Signed)
You have been seen today for finger laceration. Your imaging showed no abnormalities. Go to your PCP or return to the ED in 3 days for wound recheck. Go to your PCP or return to the ED for suture removal in about 12 days. Return should signs of infection or dehiscence arise. Dehiscence is when the wound starts to come apart. Please take all of your antibiotics until finished!   You may develop abdominal discomfort or diarrhea from the antibiotic.  You may help offset this with probiotics which you can buy or get in yogurt. Do not eat or take the probiotics until 2 hours after your antibiotic.

## 2015-12-28 NOTE — ED Notes (Signed)
Pt presents with laceration to R second finger with chainsaw blade. He rinsed with water and applied dressing. Bleeding controlled now. He c/o burning pain in the finger now.

## 2015-12-28 NOTE — ED Provider Notes (Signed)
CSN: [id number]     Arrival date & time 12/28/15  1210 History   First MD Initiated Contact with Patient 12/28/15 1308     Chief Complaint  Patient presents with  . Finger Injury     (Consider location/radiation/quality/duration/timing/severity/associated sxs/prior Treatment) HPI   Michael Pearson is a 55 y.o. male, with a history of hypertension, CAD, and MI, presenting to the ED with a right index finger laceration from a chainsaw blade. Pt rates his pain at 4/10, burning in nature, nonradiating. Pt rinsed and bandaged the wound prior to arrival. Patient denies known foreign bodies. Patient denies numbness, tingling, weakness, or any other injuries or complaints.    Past Medical History  Diagnosis Date  . Migraines     Has been evaluated multiple times in past for chronic headache in which pt has had occasional nose bleeds and bloodshot eyes. This lasted for 4-5 months. CT of the head was negative in May 2012.  Nanine Means ([place])     MRI in 2008 demonstrating 4.6 mm area of pituitary gland   . Duodenitis     May 2012 with heme positive stools at this time. Endorses only rare streaking of blood now.  . Colitis   . CAD (coronary artery disease)     NSTEMI 7/12:  Cardiac cath on 7/17: pLAD occluded, Dx 30-40%, pRCA 30-40%, mRCA 30-40%.  Proximal LAD was treated with a BMS.  Echo 7/19:  EF 60-65%, mild LVH.    ETT-Myoview 6/14:  Inf thinning, no ischemia, EF 64%, normal study  . RBBB (right bundle branch block)     Noted on EKG in 2008  . Kidney stones   . Hyperlipidemia   . Hypertension   . Myocardial infarction ([place]) 05/17/2011  . Allergy   . Asthma   . Hiatal hernia   . Duodenitis   . Ulcer   . Hx of hemorrhoids   . GERD (gastroesophageal reflux disease)   . Arthritis   . Heart murmur     as a child per the pt   Past Surgical History  Procedure Laterality Date  . Hernia repair      In 20's  . Tooth extraction    . Hammer toe surgery    . Egd  03/05/2011    Dr. Benson [place]  .  Flexible sigmoidoscopy  03/05/2011    Dr. Benson Norway  . Heart stent     Family History  Problem Relation Age of Onset  . Stroke Mother   . Heart attack Father 67    MI  . Skin cancer Father   . Colon polyps Maternal Aunt   . Colon cancer Maternal Aunt   . Obesity Brother   . Coronary artery disease Brother     Possible, pt not sure of specifics  . Hypertension Brother   . Colon polyps Paternal Grandmother   . Cancer Father     ? type "rare"   Social History  Substance Use Topics  . Smoking status: Never Smoker   . Smokeless tobacco: Never Used  . Alcohol Use: No    Review of Systems  Skin: Positive for wound.  Neurological: Negative for weakness and numbness.      Allergies  Review of patient's allergies indicates no known allergies.  Home Medications   Prior to Admission medications   Medication Sig Start Date End Date Taking? Authorizing Provider  albuterol (PROVENTIL HFA;VENTOLIN HFA) 108 (90 BASE) MCG/ACT inhaler Inhale 2 puffs into the lungs every 4 (  four) hours as needed for wheezing or shortness of breath. 08/31/11 11/20/15  Collene Gobble, MD  aspirin 81 MG tablet Take 81 mg by mouth daily.      Historical Provider, MD  cephALEXin (KEFLEX) 500 MG capsule Take 1 capsule (500 mg total) by mouth 4 (four) times daily. 12/28/15   Shawn C Joy, PA-C  cetirizine (ZYRTEC) 10 MG tablet Take 10 mg by mouth daily.    Historical Provider, MD  ezetimibe (ZETIA) 10 MG tablet Take 1 tablet (10 mg total) by mouth daily. 12/01/15   Rosalita Chessman, DO  ibuprofen (ADVIL,MOTRIN) 800 MG tablet Take 1 tablet (800 mg total) by mouth 3 (three) times daily. 12/28/15   Shawn C Joy, PA-C  losartan (COZAAR) 50 MG tablet Take 1 tablet (50 mg total) by mouth daily. 08/25/15   Rosalita Chessman, DO  metoprolol tartrate (LOPRESSOR) 25 MG tablet TAKE 1/2 TABLET BY MOUTH TWICE A DAY 12/05/15   Yvonne R Lowne, DO  mometasone (NASONEX) 50 MCG/ACT nasal spray Place 2 sprays into the nose daily. 06/17/11 11/20/15   Rosalita Chessman, DO  Multiple Vitamin (MULTIVITAMIN) tablet Take 1 tablet by mouth daily.      Historical Provider, MD   BP 128/73 mmHg  Pulse 66  Temp(Src) 97.9 F (36.6 C) (Oral)  Resp 16  Ht 6\' 2"  (1.88 m)  Wt 103.874 kg  BMI 29.39 kg/m2  SpO2 95% Physical Exam  Constitutional: He appears well-developed and well-nourished. No distress.  HENT:  Head: Normocephalic and atraumatic.  Eyes: Conjunctivae are normal.  Cardiovascular: Normal rate and regular rhythm.   Pulmonary/Chest: Effort normal.  Musculoskeletal:  ROM intact. CMS intact distally.   Neurological: He is alert.  No sensory deficits. Strength 5/5.   Skin: Skin is warm and dry. He is not diaphoretic.  1.5 cm laceration to the palmar side of the right index finger. Proximal edges are jagged. No obvious foreign bodies.   Nursing note and vitals reviewed.   ED Course  .Nerve Block Date/Time: 12/28/2015 1:31 PM Performed by: Lorayne Bender Authorized by: Lorayne Bender Consent: Verbal consent obtained. Risks and benefits: risks, benefits and alternatives were discussed Consent given by: patient Patient understanding: patient states understanding of the procedure being performed Patient consent: the patient's understanding of the procedure matches consent given Procedure consent: procedure consent matches procedure scheduled Patient identity confirmed: verbally with patient and arm band Time out: Immediately prior to procedure a "time out" was called to verify the correct patient, procedure, equipment, support staff and site/side marked as required. Indications: pain relief Body area: upper extremity Nerve: digital Laterality: right Patient sedated: no Preparation: Patient was prepped and draped in the usual sterile fashion. Patient position: sitting Needle gauge: 25 G Location technique: anatomical landmarks Local anesthetic: bupivacaine 0.5% without epinephrine Anesthetic total: 2.5 ml Outcome: pain  improved Patient tolerance: Patient tolerated the procedure well with no immediate complications  .Marland KitchenLaceration Repair Date/Time: 12/28/2015 1:32 PM Performed by: Lorayne Bender Authorized by: Lorayne Bender Consent: Verbal consent obtained. Risks and benefits: risks, benefits and alternatives were discussed Consent given by: patient Patient understanding: patient states understanding of the procedure being performed Patient consent: the patient's understanding of the procedure matches consent given Procedure consent: procedure consent matches procedure scheduled Patient identity confirmed: verbally with patient and arm band Time out: Immediately prior to procedure a "time out" was called to verify the correct patient, procedure, equipment, support staff and site/side marked as required. Body area:  upper extremity Location details: right index finger Laceration length: 1.5 cm Foreign bodies: no foreign bodies Tendon involvement: none Nerve involvement: none Vascular damage: no Anesthesia: digital block Local anesthetic: bupivacaine 0.5% without epinephrine Anesthetic total: 2.5 ml Patient sedated: no Preparation: Patient was prepped and draped in the usual sterile fashion. Irrigation solution: saline Irrigation method: syringe Amount of cleaning: extensive Debridement: none Degree of undermining: minimal Skin closure: 4-0 Prolene Number of sutures: 3 Technique: horizontal mattress Approximation: close Approximation difficulty: complex Dressing: 4x4 sterile gauze and antibiotic ointment Patient tolerance: Patient tolerated the procedure well with no immediate complications  .Splint Application Date/Time: 123XX123 1:41 PM Performed by: Lorayne Bender Authorized by: Lorayne Bender Consent: Verbal consent obtained. Risks and benefits: risks, benefits and alternatives were discussed Consent given by: patient Patient understanding: patient states understanding of the procedure being  performed Patient consent: the patient's understanding of the procedure matches consent given Procedure consent: procedure consent matches procedure scheduled Patient identity confirmed: verbally with patient and arm band Location details: right index finger Splint type: static finger Supplies used: aluminum splint Post-procedure: The splinted body part was neurovascularly unchanged following the procedure. Patient tolerance: Patient tolerated the procedure well with no immediate complications   (including critical care time) Labs Review Labs Reviewed - No data to display  Imaging Review Dg Finger Index Right  12/28/2015  CLINICAL DATA:  Patient with injury to the right index finger with chain saw. Laceration. Initial encounter. EXAM: RIGHT INDEX FINGER 2+V COMPARISON:  None. FINDINGS: Normal anatomic alignment. No evidence for acute fracture or dislocation. No radiopaque foreign body. Overlying bandaging material. IMPRESSION: No radiopaque foreign body. No acute osseous abnormality. Electronically Signed   By: Lovey Newcomer M.D.   On: 12/28/2015 13:31   I have personally reviewed and evaluated these images and lab results as part of my medical decision-making.   EKG Interpretation None          MDM   Final diagnoses:  Finger laceration, initial encounter    Jeannine Boga presents with right index finger laceration from a chainsaw that occurred just prior to arrival.  Patient's wound is small with no foreign bodies and no obvious contamination. Patient's finger is neurovascularly intact. Wound was able to be closed with some more advanced techniques required.  No other injuries were found. Patient placed on Keflex due to the cause of the wound. Patient to return in 3 days for wound check and in 12 days for suture removal. Patient given home care instructions and return precautions. Patient advised to return sooner if signs of infection or dehiscence occur. Patient voiced  understanding of these instructions and is comfortable with discharge.  Filed Vitals:   12/28/15 1228 12/28/15 1231 12/28/15 1413  BP:  135/66 128/73  Pulse:  72 66  Temp:  98 F (36.7 C) 97.9 F (36.6 C)  TempSrc:  Oral Oral  Resp:  16 16  Height: 6\' 2"  (1.88 m)    Weight: 103.874 kg    SpO2:  97% 95%     Lorayne Bender, PA-C 12/28/15 1856  Milton Ferguson, MD 01/01/16 [phone]

## 2016-01-09 ENCOUNTER — Ambulatory Visit (INDEPENDENT_AMBULATORY_CARE_PROVIDER_SITE_OTHER): Payer: Self-pay | Admitting: Physician Assistant

## 2016-01-09 ENCOUNTER — Encounter: Payer: Self-pay | Admitting: Physician Assistant

## 2016-01-09 VITALS — BP 102/70 | HR 68 | Temp 97.6°F | Ht 74.0 in | Wt 232.2 lb

## 2016-01-09 DIAGNOSIS — Z4802 Encounter for removal of sutures: Secondary | ICD-10-CM

## 2016-01-09 NOTE — Patient Instructions (Signed)
Please come in early Monday morning for [place] to remove the remaining stitches. Should only take a couple of minutes. I am afraid if I remove them today your wound will pop back open as it has not healed quite enough yet.  Please continue cleaning the area (water only). Avoid excess peroxide use.

## 2016-01-09 NOTE — Progress Notes (Signed)
Patient presents to clinic today for suture removal of Right second phalanx. Sustained injury via chainsaw blade 12 days ago. Proceeded to ER where Td was updated, laceration cleaned out and 3 sutures placed. Patient denies fever, chills. Some mild residual pain. Denies drainage from site. Has been keeping area clean and dry.  Past Medical History  Diagnosis Date  . Migraines     Has been evaluated multiple times in past for chronic headache in which pt has had occasional nose bleeds and bloodshot eyes. This lasted for 4-5 months. CT of the head was negative in May 2012.  Nanine Means ([place])     MRI in 2008 demonstrating 4.6 mm area of pituitary gland   . Duodenitis     May 2012 with heme positive stools at this time. Endorses only rare streaking of blood now.  . Colitis   . CAD (coronary artery disease)     NSTEMI 7/12:  Cardiac cath on 7/17: pLAD occluded, Dx 30-40%, pRCA 30-40%, mRCA 30-40%.  Proximal LAD was treated with a BMS.  Echo 7/19:  EF 60-65%, mild LVH.    ETT-Myoview 6/14:  Inf thinning, no ischemia, EF 64%, normal study  . RBBB (right bundle branch block)     Noted on EKG in 2008  . Kidney stones   . Hyperlipidemia   . Hypertension   . Myocardial infarction ([place]) 05/17/2011  . Allergy   . Asthma   . Hiatal hernia   . Duodenitis   . Ulcer   . Hx of hemorrhoids   . GERD (gastroesophageal reflux disease)   . Arthritis   . Heart murmur     as a child per the pt    Current Outpatient Prescriptions on File Prior to Visit  Medication Sig Dispense Refill  . aspirin 81 MG tablet Take 81 mg by mouth daily.      . cetirizine (ZYRTEC) 10 MG tablet Take 10 mg by mouth daily.    Marland Kitchen ezetimibe (ZETIA) 10 MG tablet Take 1 tablet (10 mg total) by mouth daily. 30 tablet 2  . ibuprofen (ADVIL,MOTRIN) 800 MG tablet Take 1 tablet (800 mg total) by mouth 3 (three) times daily. 21 tablet 0  . losartan (COZAAR) 50 MG tablet Take 1 tablet (50 mg total) by mouth daily. 30 tablet 5  .  metoprolol tartrate (LOPRESSOR) 25 MG tablet TAKE 1/2 TABLET BY MOUTH TWICE A DAY 30 tablet 0  . Multiple Vitamin (MULTIVITAMIN) tablet Take 1 tablet by mouth daily.      Marland Kitchen albuterol (PROVENTIL HFA;VENTOLIN HFA) 108 (90 BASE) MCG/ACT inhaler Inhale 2 puffs into the lungs every 4 (four) hours as needed for wheezing or shortness of breath.    . mometasone (NASONEX) 50 MCG/ACT nasal spray Place 2 sprays into the nose daily. 17 g 2   No current facility-administered medications on file prior to visit.   No Known Allergies  Family History  Problem Relation Age of Onset  . Stroke Mother   . Heart attack Father 29    MI  . Skin cancer Father   . Colon polyps Maternal Aunt   . Colon cancer Maternal Aunt   . Obesity Brother   . Coronary artery disease Brother     Possible, pt not sure of specifics  . Hypertension Brother   . Colon polyps Paternal Grandmother   . Cancer Father     ? type "rare"    Social History   Social History  . Marital Status:  Single    Spouse Name: N/A  . Number of Children: N/A  . Years of Education: N/A   Occupational History  . [employer]-- cleans 3 buildings     Fairly physical job  .     Social History Main Topics  . Smoking status: Never Smoker   . Smokeless tobacco: Never Used  . Alcohol Use: No  . Drug Use: No  . Sexual Activity: Not Currently   Other Topics Concern  . None   Social History Narrative   Lives with brother and mother   Mother is quite sick, he and his brother take turns taking care of her   No children   Review of Systems - See HPI.  All other ROS are negative.  BP 102/70 mmHg  Pulse 68  Temp(Src) 97.6 F (36.4 C) (Oral)  Ht '6\' 2"'  (1.88 m)  Wt 232 lb 4 oz (105.348 kg)  BMI 29.81 kg/m2  SpO2 98%  Physical Exam  Constitutional: He is oriented to person, place, and time.  Cardiovascular: Normal rate, regular rhythm, normal heart sounds and intact distal pulses.   Pulmonary/Chest: Effort normal and breath sounds  normal. No respiratory distress. He has no wheezes. He has no rales. He exhibits no tenderness.  Neurological: He is alert and oriented to person, place, and time.  Skin: Skin is warm and dry.     Vitals reviewed.   Recent Results (from the past 2160 hour(s))  Comp Met (CMET)     Status: None   Collection Time: 11/20/15  4:09 PM  Result Value Ref Range   Sodium 138 135 - 145 mEq/L   Potassium 3.9 3.5 - 5.1 mEq/L   Chloride 102 96 - 112 mEq/L   CO2 27 19 - 32 mEq/L   Glucose, Bld 85 70 - 99 mg/dL   BUN 13 6 - 23 mg/dL   Creatinine, Ser 0.84 0.40 - 1.50 mg/dL   Total Bilirubin 0.7 0.2 - 1.2 mg/dL   Alkaline Phosphatase 55 39 - 117 U/L   AST 20 0 - 37 U/L   ALT 21 0 - 53 U/L   Total Protein 7.4 6.0 - 8.3 g/dL   Albumin 4.5 3.5 - 5.2 g/dL   Calcium 9.9 8.4 - 10.5 mg/dL   GFR 100.87 >60.00 mL/min  CBC with Differential/Platelet     Status: None   Collection Time: 11/20/15  4:09 PM  Result Value Ref Range   WBC 7.6 4.0 - 10.5 K/uL   RBC 4.79 4.22 - 5.81 Mil/uL   Hemoglobin 14.9 13.0 - 17.0 g/dL   HCT 45.6 39.0 - 52.0 %   MCV 95.3 78.0 - 100.0 fl   MCHC 32.6 30.0 - 36.0 g/dL   RDW 14.0 11.5 - 15.5 %   Platelets 235.0 150.0 - 400.0 K/uL   Neutrophils Relative % 57.9 43.0 - 77.0 %   Lymphocytes Relative 30.9 12.0 - 46.0 %   Monocytes Relative 7.2 3.0 - 12.0 %   Eosinophils Relative 3.3 0.0 - 5.0 %   Basophils Relative 0.7 0.0 - 3.0 %   Neutro Abs 4.4 1.4 - 7.7 K/uL   Lymphs Abs 2.3 0.7 - 4.0 K/uL   Monocytes Absolute 0.5 0.1 - 1.0 K/uL   Eosinophils Absolute 0.2 0.0 - 0.7 K/uL   Basophils Absolute 0.1 0.0 - 0.1 K/uL  Lipid panel     Status: Abnormal   Collection Time: 11/20/15  4:09 PM  Result Value Ref Range   Cholesterol 198  0 - 200 mg/dL    Comment: ATP III Classification       Desirable:  < 200 mg/dL               Borderline High:  200 - 239 mg/dL          High:  > = 240 mg/dL   Triglycerides 56.0 0.0 - 149.0 mg/dL    Comment: Normal:  <150 mg/dLBorderline High:  150  - 199 mg/dL   HDL 56.20 >39.00 mg/dL   VLDL 11.2 0.0 - 40.0 mg/dL   LDL Cholesterol 131 (H) 0 - 99 mg/dL   Total CHOL/HDL Ratio 4     Comment:                Men          Women1/2 Average Risk     3.4          3.3Average Risk          5.0          4.42X Average Risk          9.6          7.13X Average Risk          15.0          11.0                       NonHDL 141.85     Comment: NOTE:  Non-HDL goal should be 30 mg/dL higher than patient's LDL goal (i.e. LDL goal of < 70 mg/dL, would have non-HDL goal of < 100 mg/dL)  Microalbumin / creatinine urine ratio     Status: None   Collection Time: 11/20/15  4:09 PM  Result Value Ref Range   Microalb, Ur 1.5 0.0 - 1.9 mg/dL   Creatinine,U 279.3 mg/dL   Microalb Creat Ratio 0.5 0.0 - 30.0 mg/g  Hepatitis C antibody     Status: None   Collection Time: 11/20/15  4:09 PM  Result Value Ref Range   HCV Ab NEGATIVE NEGATIVE    Assessment/Plan: Visit for suture removal Medial suture removed. Will hold off on removing other sutures until Monday to allow more time for healing. Concern the wound will dehisce if sutures removed too soon. Continue cleaning of area. Follow-up Monday.

## 2016-01-09 NOTE — Assessment & Plan Note (Signed)
Medial suture removed. Will hold off on removing other sutures until Monday to allow more time for healing. Concern the wound will dehisce if sutures removed too soon. Continue cleaning of area. Follow-up Monday.

## 2016-01-09 NOTE — Progress Notes (Signed)
Pre visit review using our clinic review tool, if applicable. No additional management support is needed unless otherwise documented below in the visit note/SLS  

## 2016-01-12 ENCOUNTER — Ambulatory Visit (INDEPENDENT_AMBULATORY_CARE_PROVIDER_SITE_OTHER): Payer: Self-pay | Admitting: Family Medicine

## 2016-01-12 ENCOUNTER — Encounter: Payer: Self-pay | Admitting: *Deleted

## 2016-01-12 VITALS — BP 131/78 | HR 66 | Temp 97.7°F | Ht 74.0 in | Wt 234.0 lb

## 2016-01-12 DIAGNOSIS — S61210D Laceration without foreign body of right index finger without damage to nail, subsequent encounter: Secondary | ICD-10-CM

## 2016-01-12 DIAGNOSIS — Z4802 Encounter for removal of sutures: Secondary | ICD-10-CM

## 2016-01-12 NOTE — Progress Notes (Signed)
Patient ID: Michael Pearson, male    DOB: 10-Nov-1960  Age: 55 y.o. MRN: [id number]    Subjective:  Subjective HPI Michael Pearson presents for suture removal.  Pt with no new complaings. Pt cut his index finger on the R had with a chainsaw--- see ER note.  Pt seen Friday but provider did not think it was healing well enough.  Only 1 suture was removed and he was asked to return today.    Review of Systems  Constitutional: Negative for diaphoresis, appetite change, fatigue and unexpected weight change.  Eyes: Negative for pain, redness and visual disturbance.  Respiratory: Negative for cough, chest tightness, shortness of breath and wheezing.   Cardiovascular: Negative for chest pain, palpitations and leg swelling.  Endocrine: Negative for cold intolerance, heat intolerance, polydipsia, polyphagia and polyuria.  Genitourinary: Negative for dysuria, frequency and difficulty urinating.  Neurological: Negative for dizziness, light-headedness, numbness and headaches.    History Past Medical History  Diagnosis Date  . Migraines     Has been evaluated multiple times in past for chronic headache in which pt has had occasional nose bleeds and bloodshot eyes. This lasted for 4-5 months. CT of the head was negative in May 2012.  Nanine Means ([place])     MRI in 2008 demonstrating 4.6 mm area of pituitary gland   . Duodenitis     May 2012 with heme positive stools at this time. Endorses only rare streaking of blood now.  . Colitis   . CAD (coronary artery disease)     NSTEMI 7/12:  Cardiac cath on 7/17: pLAD occluded, Dx 30-40%, pRCA 30-40%, mRCA 30-40%.  Proximal LAD was treated with a BMS.  Echo 7/19:  EF 60-65%, mild LVH.    ETT-Myoview 6/14:  Inf thinning, no ischemia, EF 64%, normal study  . RBBB (right bundle branch block)     Noted on EKG in 2008  . Kidney stones   . Hyperlipidemia   . Hypertension   . Myocardial infarction ([place]) 05/17/2011  . Allergy   . Asthma   . Hiatal hernia   . Ulcer   .  Hx of hemorrhoids   . GERD (gastroesophageal reflux disease)   . Arthritis   . Heart murmur     as a child per the pt  . Diverticulosis     He has past surgical history that includes Hernia repair; Tooth extraction; Hammer toe surgery; egd (03/05/2011); Flexible sigmoidoscopy (03/05/2011); and Heart stent.   His family history includes Cancer in his father; Colon cancer in his maternal aunt; Colon polyps in his maternal aunt and paternal grandmother; Coronary artery disease in his brother; Heart attack (age of onset: 44) in his father; Hypertension in his brother; Obesity in his brother; Skin cancer in his father; Stroke in his mother.He reports that he has never smoked. He has never used smokeless tobacco. He reports that he does not drink alcohol or use illicit drugs.  Current Outpatient Prescriptions on File Prior to Visit  Medication Sig Dispense Refill  . albuterol (PROVENTIL HFA;VENTOLIN HFA) 108 (90 BASE) MCG/ACT inhaler Inhale 2 puffs into the lungs every 4 (four) hours as needed for wheezing or shortness of breath.    Marland Kitchen aspirin 81 MG tablet Take 81 mg by mouth daily.      . cetirizine (ZYRTEC) 10 MG tablet Take 10 mg by mouth daily.    Marland Kitchen ezetimibe (ZETIA) 10 MG tablet Take 1 tablet (10 mg total) by mouth daily. 30 tablet  2  . ibuprofen (ADVIL,MOTRIN) 800 MG tablet Take 1 tablet (800 mg total) by mouth 3 (three) times daily. 21 tablet 0  . losartan (COZAAR) 50 MG tablet Take 1 tablet (50 mg total) by mouth daily. 30 tablet 5  . metoprolol tartrate (LOPRESSOR) 25 MG tablet TAKE 1/2 TABLET BY MOUTH TWICE A DAY 30 tablet 0  . mometasone (NASONEX) 50 MCG/ACT nasal spray Place 2 sprays into the nose daily. 17 g 2  . Multiple Vitamin (MULTIVITAMIN) tablet Take 1 tablet by mouth daily.       No current facility-administered medications on file prior to visit.     Objective:  Objective Physical Exam  Musculoskeletal:       Right hand: He exhibits tenderness.       Hands: Nursing note and  vitals reviewed.  BP 131/78 mmHg  Pulse 66  Temp(Src) 97.7 F (36.5 C) (Oral)  Ht 6\' 2"  (1.88 m)  Wt 234 lb (106.142 kg)  BMI 30.03 kg/m2  SpO2 97% Wt Readings from Last 3 Encounters:  01/12/16 234 lb (106.142 kg)  01/09/16 232 lb 4 oz (105.348 kg)  12/28/15 229 lb (103.874 kg)     Lab Results  Component Value Date   WBC 7.6 11/20/2015   HGB 14.9 11/20/2015   HCT 45.6 11/20/2015   PLT 235.0 11/20/2015   GLUCOSE 85 11/20/2015   CHOL 198 11/20/2015   TRIG 56.0 11/20/2015   HDL 56.20 11/20/2015   LDLCALC 131* 11/20/2015   ALT 21 11/20/2015   AST 20 11/20/2015   NA 138 11/20/2015   K 3.9 11/20/2015   CL 102 11/20/2015   CREATININE 0.84 11/20/2015   BUN 13 11/20/2015   CO2 27 11/20/2015   TSH 0.98 11/03/2012   PSA 0.26 11/03/2012   INR 0.97 05/17/2011   HGBA1C 5.8 09/17/2014   MICROALBUR 1.5 11/20/2015    Dg Finger Index Right  12/28/2015  CLINICAL DATA:  Patient with injury to the right index finger with chain saw. Laceration. Initial encounter. EXAM: RIGHT INDEX FINGER 2+V COMPARISON:  None. FINDINGS: Normal anatomic alignment. No evidence for acute fracture or dislocation. No radiopaque foreign body. Overlying bandaging material. IMPRESSION: No radiopaque foreign body. No acute osseous abnormality. Electronically Signed   By: Lovey Newcomer M.D.   On: 12/28/2015 13:31     Assessment & Plan:  Plan I am having Michael Pearson maintain his aspirin, multivitamin, mometasone, albuterol, losartan, ezetimibe, cetirizine, metoprolol tartrate, and ibuprofen.  No orders of the defined types were placed in this encounter.    Problem List Items Addressed This Visit    Visit for suture removal - Primary    2 sutures removed with no complications rto prn  Keep dry until sutures fall off      Laceration of right index finger w/o foreign body w/o damage to nail    Sutures removed with no complications Steri strips put on to keep wound closed Keep steri strips on until they fall  off rto prn          Follow-up: No Follow-up on file.  Garnet Koyanagi, DO

## 2016-01-12 NOTE — Progress Notes (Signed)
Pre visit review using our clinic review tool, if applicable. No additional management support is needed unless otherwise documented below in the visit note. 

## 2016-01-12 NOTE — Assessment & Plan Note (Signed)
2 sutures removed with no complications rto prn  Keep dry until sutures fall off

## 2016-01-14 DIAGNOSIS — S61210A Laceration without foreign body of right index finger without damage to nail, initial encounter: Secondary | ICD-10-CM | POA: Insufficient documentation

## 2016-01-14 NOTE — Assessment & Plan Note (Signed)
Sutures removed with no complications Steri strips put on to keep wound closed Keep steri strips on until they fall off rto prn

## 2016-01-21 ENCOUNTER — Encounter: Payer: Self-pay | Admitting: Internal Medicine

## 2016-01-21 ENCOUNTER — Ambulatory Visit (INDEPENDENT_AMBULATORY_CARE_PROVIDER_SITE_OTHER): Payer: Self-pay | Admitting: Internal Medicine

## 2016-01-21 VITALS — BP 110/78 | HR 60 | Ht 74.0 in | Wt 235.2 lb

## 2016-01-21 DIAGNOSIS — K5010 Crohn's disease of large intestine without complications: Secondary | ICD-10-CM

## 2016-01-21 DIAGNOSIS — R1310 Dysphagia, unspecified: Secondary | ICD-10-CM

## 2016-01-21 DIAGNOSIS — K219 Gastro-esophageal reflux disease without esophagitis: Secondary | ICD-10-CM

## 2016-01-21 DIAGNOSIS — K5730 Diverticulosis of large intestine without perforation or abscess without bleeding: Secondary | ICD-10-CM

## 2016-01-21 MED ORDER — OMEPRAZOLE 40 MG PO CPDR: 40.0000 mg | DELAYED_RELEASE_CAPSULE | Freq: Every day | ORAL | Status: DC

## 2016-01-21 NOTE — Progress Notes (Signed)
Patient ID: Michael Pearson, male   DOB: 12/01/1960, 55 y.o.   MRN: [id number] HPI: Michael Pearson is a 54 year old male with a history of diverticulitis, diverticulosis with segmental colitis, GERD who is here for follow-up to evaluate uncontrolled reflux. He also has a history of CAD, pituitary microadenoma, migraines, hypertension, hyperlipidemia and asthma. He was last seen in 2014 after recovering from acute complicated diverticulitis. After this visit he came for colonoscopy which was performed on 04/05/2013. This showed moderate diverticulosis in the sigmoid with associated erythema. Biopsies were obtained. The colon was otherwise normal. Biopsy showed focally ulcerated colorectal mucosa. No evidence of IBD, dysplasia or malignancy. Finding most consistent with SCAD.  He reports that he is taking MiraLAX 17 g daily and bowel movements have been regular, easy to pass and without blood or melena. He's had no further issues with abdominal pain or diverticulitis.  More recently he reports he's been dealing with severe heartburn and regurgitation. This is worse with lying down but he also notices water brash which is sour in nature even with bending over. He reports being woken from sleep with heartburn symptoms. He's also noticed solid food dysphagia more the last 6 months. No issue with liquid dysphagia or odynophagia. He has propped the head of his bed and used Tums over-the-counter for heartburn relief. He also takes honey which he feels helps. Despite the over-the-counter remedies heartburn has been severe for him recently.  Past Medical History  Diagnosis Date  . Migraines     Has been evaluated multiple times in past for chronic headache in which pt has had occasional nose bleeds and bloodshot eyes. This lasted for 4-5 months. CT of the head was negative in May 2012.  [name]     MRI in 2008 demonstrating 4.6 mm area of pituitary gland   . Duodenitis     May 2012 with heme positive stools  at this time. Endorses only rare streaking of blood now.  . Colitis   . CAD (coronary artery disease)     NSTEMI 7/12:  Cardiac cath on 7/17: pLAD occluded, Dx 30-40%, pRCA 30-40%, mRCA 30-40%.  Proximal LAD was treated with a BMS.  Echo 7/19:  EF 60-65%, mild LVH.    ETT-Myoview 6/14:  Inf thinning, no ischemia, EF 64%, normal study  . RBBB (right bundle branch block)     Noted on EKG in 2008  . Kidney stones   . Hyperlipidemia   . Hypertension   . Myocardial infarction ([place]) 05/17/2011  . Allergy   . Asthma   . Hiatal hernia   . Ulcer   . Hx of hemorrhoids   . GERD (gastroesophageal reflux disease)   . Arthritis   . Heart murmur     as a child per the pt  . Diverticulosis     Past Surgical History  Procedure Laterality Date  . Hernia repair      In 20's  . Tooth extraction    . Hammer toe surgery    . Egd  03/05/2011    Dr. Benson Norway  . Flexible sigmoidoscopy  03/05/2011    Dr. Benson Norway  . Heart stent      Outpatient Prescriptions Prior to Visit  Medication Sig Dispense Refill  . aspirin 81 MG tablet Take 81 mg by mouth daily.      . cetirizine (ZYRTEC) 10 MG tablet Take 10 mg by mouth daily.    Marland Kitchen ezetimibe (ZETIA) 10 MG tablet Take 1 tablet (10  mg total) by mouth daily. 30 tablet 2  . ibuprofen (ADVIL,MOTRIN) 800 MG tablet Take 1 tablet (800 mg total) by mouth 3 (three) times daily. 21 tablet 0  . losartan (COZAAR) 50 MG tablet Take 1 tablet (50 mg total) by mouth daily. 30 tablet 5  . metoprolol tartrate (LOPRESSOR) 25 MG tablet TAKE 1/2 TABLET BY MOUTH TWICE A DAY 30 tablet 0  . Multiple Vitamin (MULTIVITAMIN) tablet Take 1 tablet by mouth daily.      Marland Kitchen albuterol (PROVENTIL HFA;VENTOLIN HFA) 108 (90 BASE) MCG/ACT inhaler Inhale 2 puffs into the lungs every 4 (four) hours as needed for wheezing or shortness of breath.    . mometasone (NASONEX) 50 MCG/ACT nasal spray Place 2 sprays into the nose daily. 17 g 2   No facility-administered medications prior to visit.    No Known  Allergies  Family History  Problem Relation Age of Onset  . Stroke Mother   . Heart attack Father 68    MI  . Skin cancer Father   . Colon polyps Maternal Aunt   . Colon cancer Maternal Aunt   . Obesity Brother   . Coronary artery disease Brother     Possible, pt not sure of specifics  . Hypertension Brother   . Colon polyps Paternal Grandmother   . Cancer Father     ? type "rare"    Social History  Substance Use Topics  . Smoking status: Never Smoker   . Smokeless tobacco: Never Used  . Alcohol Use: No    ROS: As per history of present illness, otherwise negative  BP 110/78 mmHg  Pulse 60  Ht 6\' 2"  (1.88 m)  Wt 235 lb 3.2 oz (106.686 kg)  BMI 30.19 kg/m2 Constitutional: Well-developed and well-nourished. No distress. HEENT: Normocephalic and atraumatic. Oropharynx is clear and moist. No oropharyngeal exudate. Conjunctivae are normal.  No scleral icterus. Neck: Neck supple. Trachea midline. Cardiovascular: Normal rate, regular rhythm and intact distal pulses.  Pulmonary/chest: Effort normal and breath sounds normal. No wheezing, rales or rhonchi. Abdominal: Soft, nontender, nondistended. Bowel sounds active throughout.  Extremities: no clubbing, cyanosis, or edema Neurological: Alert and oriented to person place and time. Skin: Skin is warm and dry.  Psychiatric: Normal mood and affect. Behavior is normal.  RELEVANT LABS AND IMAGING: CBC    Component Value Date/Time   WBC 7.6 11/20/2015 1609   RBC 4.79 11/20/2015 1609   HGB 14.9 11/20/2015 1609   HCT 45.6 11/20/2015 1609   PLT 235.0 11/20/2015 1609   MCV 95.3 11/20/2015 1609   MCH 31.2 02/04/2013 0400   MCHC 32.6 11/20/2015 1609   RDW 14.0 11/20/2015 1609   LYMPHSABS 2.3 11/20/2015 1609   MONOABS 0.5 11/20/2015 1609   EOSABS 0.2 11/20/2015 1609   BASOSABS 0.1 11/20/2015 1609    CMP     Component Value Date/Time   NA 138 11/20/2015 1609   K 3.9 11/20/2015 1609   CL 102 11/20/2015 1609   CO2 27  11/20/2015 1609   GLUCOSE 85 11/20/2015 1609   BUN 13 11/20/2015 1609   CREATININE 0.84 11/20/2015 1609   CALCIUM 9.9 11/20/2015 1609   PROT 7.4 11/20/2015 1609   ALBUMIN 4.5 11/20/2015 1609   AST 20 11/20/2015 1609   ALT 21 11/20/2015 1609   ALKPHOS 55 11/20/2015 1609   BILITOT 0.7 11/20/2015 1609   GFRNONAA >90 02/04/2013 0400   GFRAA >90 02/04/2013 0400   EGD, date 03/05/2011, Dr. Carol Ada -- mild hiatal  hernia, mild duodenitis.  Flexible sigmoidoscopy, date 03/05/2011, Dr. Carol Ada -- colitis with scattered ulceration extending from rectum to 30 cm. Medium internal hemorrhoids. Biopsy. Pathology = erosion with active inflammation. No granulomas. Differential infectious versus ischemic.   ASSESSMENT/PLAN: 55 year old male with a history of diverticulitis, diverticulosis with segmental colitis, GERD who is here for follow-up to evaluate uncontrolled reflux.  1. GERD with dysphagia -- uncontrolled reflux with known hiatal hernia. Going to start omeprazole 40 mg 30 minutes before breakfast each day. Call if not improving or unaffordable. Due to dysphagia, I recommended upper endoscopy for direct visualization. We discussed the risks, benefits and alternatives and he is agreeable to proceed. GERD diet recommended and reviewed.  2. History of segmental colitis associated with diverticulosis -- biopsy-proven from 2014 though without symptoms. No evidence for IBD. Without symptoms I would not treat. He should continue MiraLAX 17 g daily to keep stools soft and avoid constipation.  25 minutes spent with the patient today. Greater than 50% was spent in counseling and coordination of care with the patient   [id number] R Etter Sjogren, [address] [place], [place] [id number]

## 2016-01-21 NOTE — Patient Instructions (Signed)
You have been scheduled for an endoscopy. Please follow written instructions given to you at your visit today. If you use inhalers (even only as needed), please bring them with you on the day of your procedure. Your physician has requested that you go to www.startemmi.com and enter the access code given to you at your visit today. This web site gives a general overview about your procedure. However, you should still follow specific instructions given to you by our office regarding your preparation for the procedure.  We have sent omeprazole to your pharmacy

## 2016-01-26 ENCOUNTER — Telehealth: Payer: Self-pay | Admitting: *Deleted

## 2016-01-26 MED ORDER — METOPROLOL TARTRATE 25 MG PO TABS: 12.5000 mg | ORAL_TABLET | Freq: Two times a day (BID) | ORAL | Status: DC

## 2016-01-26 NOTE — Telephone Encounter (Signed)
Rx faxed.    KP 

## 2016-01-26 NOTE — Telephone Encounter (Signed)
Refill x1--- he needs cardiology f/u

## 2016-01-26 NOTE — Telephone Encounter (Signed)
MRN:  [id number]   Description: 55 year old male  Provider: Sherren Mocha, MD  Department: [facility]       Call Documentation      Barkley Boards, RN at 12/04/2015 2:01 PM     Status: Signed       Expand All Collapse All   Pt was last seen by Cardiology in April 2015. The pt recently saw PCP 11/20/2015. Please forward refill to Dr Etter Sjogren to refill. Our office left the pt a voicemail on 11/27/15 to contact Cardiology for an office visit.        WILL ROUTE TO PCP

## 2016-02-17 ENCOUNTER — Ambulatory Visit (AMBULATORY_SURGERY_CENTER): Payer: Self-pay | Admitting: Internal Medicine

## 2016-02-17 ENCOUNTER — Encounter: Payer: Self-pay | Admitting: Internal Medicine

## 2016-02-17 VITALS — BP 124/70 | HR 51 | Temp 98.4°F | Resp 10 | Ht 74.0 in | Wt 235.0 lb

## 2016-02-17 DIAGNOSIS — R1310 Dysphagia, unspecified: Secondary | ICD-10-CM

## 2016-02-17 DIAGNOSIS — K219 Gastro-esophageal reflux disease without esophagitis: Secondary | ICD-10-CM

## 2016-02-17 DIAGNOSIS — K3189 Other diseases of stomach and duodenum: Secondary | ICD-10-CM

## 2016-02-17 MED ORDER — SODIUM CHLORIDE 0.9 % IV SOLN: 500.0000 mL | INTRAVENOUS | Status: DC

## 2016-02-17 MED ORDER — RANITIDINE HCL 150 MG PO TABS
150.0000 mg | ORAL_TABLET | Freq: Every day | ORAL | Status: DC
Start: 2016-02-17 — End: 2016-11-23

## 2016-02-17 NOTE — Progress Notes (Signed)
To recovery, report to [place], RN, VSS 

## 2016-02-17 NOTE — Patient Instructions (Addendum)
YOU HAD AN ENDOSCOPIC PROCEDURE TODAY AT [facility]:   Refer to the procedure report that was given to you for any specific questions about what was found during the examination.  If the procedure report does not answer your questions, please call your gastroenterologist to clarify.  If you requested that your care partner not be given the details of your procedure findings, then the procedure report has been included in a sealed envelope for you to review at your convenience later.  YOU SHOULD EXPECT: Some feelings of bloating in the abdomen. Passage of more gas than usual.  Walking can help get rid of the air that was put into your GI tract during the procedure and reduce the bloating. If you had a lower endoscopy (such as a colonoscopy or flexible sigmoidoscopy) you may notice spotting of blood in your stool or on the toilet paper. If you underwent a bowel prep for your procedure, you may not have a normal bowel movement for a few days.  Please Note:  You might notice some irritation and congestion in your nose or some drainage.  This is from the oxygen used during your procedure.  There is no need for concern and it should clear up in a day or so.  SYMPTOMS TO REPORT IMMEDIATELY:     Following upper endoscopy (EGD)  Vomiting of blood or coffee ground material  New chest pain or pain under the shoulder blades  Painful or persistently difficult swallowing  New shortness of breath  Fever of 100F or higher  Black, tarry-looking stools  For urgent or emergent issues, a gastroenterologist can be reached at any hour by calling [phone].   DIET:  FOLLOW DILATATION DIET GIVEN TO YOU TODAY   ACTIVITY:  You should plan to take it easy for the rest of today and you should NOT DRIVE or use heavy machinery until tomorrow (because of the sedation medicines used during the test).    FOLLOW UP: Our staff will call the number listed on your records the next business day  following your procedure to check on you and address any questions or concerns that you may have regarding the information given to you following your procedure. If we do not reach you, we will leave a message.  However, if you are feeling well and you are not experiencing any problems, there is no need to return our call.  We will assume that you have returned to your regular daily activities without incident.  If any biopsies were taken you will be contacted by phone or by letter within the next 1-3 weeks.  Please call us at [phone] if you have not heard about the biopsies in 3 weeks.    SIGNATURES/CONFIDENTIALITY: You and/or your care partner have signed paperwork which will be entered into your electronic medical record.  These signatures attest to the fact that that the information above on your After Visit Summary has been reviewed and is understood.  Full responsibility of the confidentiality of this discharge information lies with you and/or your care-partner.   FOLLOW DILATATION DIET TODAY    CONTINUE PRESENT MEDICATIONS INCLUDING OMEPRAZOLE 40 MG BEFORE BREAKFAST  ADD ZANTAC 150 MG IN EVENING FOR UNCONTROLLED HEARTBURN ( SENT RX TO [facility] ON [address])  RETURN TO GI CLINIC IF SYMPTOMS PERSIST

## 2016-02-17 NOTE — Progress Notes (Signed)
Called to room to assist during endoscopic procedure.  Patient ID and intended procedure confirmed with present staff. Received instructions for my participation in the procedure from the performing physician.  

## 2016-02-17 NOTE — Op Note (Signed)
[place] Patient Name: Michael Pearson Procedure Date: 02/17/2016 3:08 PM MRN: [id number] Endoscopist: Jerene Bears , MD Age: 55 Date of Birth: 12/28/1960 Gender: Male Procedure:                Upper GI endoscopy Indications:              Dysphagia, Heartburn Medicines:                Monitored Anesthesia Care Procedure:                Pre-Anesthesia Assessment:                           - Prior to the procedure, a History and Physical                            was performed, and patient medications and                            allergies were reviewed. The patient's tolerance of                            previous anesthesia was also reviewed. The risks                            and benefits of the procedure and the sedation                            options and risks were discussed with the patient.                            All questions were answered, and informed consent                            was obtained. Prior Anticoagulants: The patient has                            taken no previous anticoagulant or antiplatelet                            agents. ASA Grade Assessment: II - A patient with                            mild systemic disease. After reviewing the risks                            and benefits, the patient was deemed in                            satisfactory condition to undergo the procedure.                           After obtaining informed consent, the endoscope was  passed under direct vision. Throughout the                            procedure, the patient's blood pressure, pulse, and                            oxygen saturations were monitored continuously. The                            Model GIF-[id number]) scope was introduced                            through the mouth, and advanced to the second part                            of duodenum. The upper GI endoscopy was   accomplished without difficulty. The patient                            tolerated the procedure well. Scope In: Scope Out: Findings:                 No endoscopic abnormality was evident in the                            esophagus to explain the patient's complaint of                            dysphagia. It was decided, however, to proceed with                            dilation of the entire esophagus. A guidewire was                            placed and the scope was withdrawn. Dilation was                            performed with a Savary dilator with mild                            resistance at 18 mm.                           A 1-2 cm hiatal hernia was present.                           The entire examined stomach was normal.                           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  Biopsies were taken with a cold forceps for                            histology.                           The second portion of the duodenum was normal. Complications:            No immediate complications. Estimated Blood Loss:     Estimated blood loss: none. Impression:               - No endoscopic esophageal abnormality to explain                            patient's dysphagia. Z-line regular at 38 cm                            without evidence of esophagitis. Esophagus dilated                            to 18 mm with Savary over guidewire.                           - 1-2 cm hiatal hernia.                           - Normal stomach.                           - Nodular mucosa in the duodenal bulb. Biopsied.                           - Normal second portion of the duodenum. Recommendation:           - Patient has a contact number available for                            emergencies. The signs and symptoms of potential                            delayed complications were  discussed with the                            patient. Return to normal activities tomorrow.                            Written discharge instructions were provided to the                            patient.                           - Resume previous diet.                           - Await pathology results.                           -  Continue present medications, including                            omeprazole 40 mg before breakfast.                           - Begin Zantac 150 mg (ranitidine) in the evening                            as needed for uncontrolled heartburn.                           - Return to your GI clinic if symptoms of acid                            reflux or trouble swallowing persist. Jerene Bears, MD 02/17/2016 3:35:34 PM This report has been signed electronically.

## 2016-02-18 ENCOUNTER — Telehealth: Payer: Self-pay | Admitting: *Deleted

## 2016-02-18 ENCOUNTER — Telehealth: Payer: Self-pay | Admitting: Internal Medicine

## 2016-02-18 NOTE — Telephone Encounter (Signed)
Left verbal authorization on the voicemail at [facility].

## 2016-02-18 NOTE — Telephone Encounter (Signed)
Message left

## 2016-02-21 ENCOUNTER — Other Ambulatory Visit: Payer: Self-pay | Admitting: Family Medicine

## 2016-02-23 ENCOUNTER — Encounter: Payer: Self-pay | Admitting: Internal Medicine

## 2016-04-01 ENCOUNTER — Other Ambulatory Visit: Payer: Self-pay | Admitting: Family Medicine

## 2016-04-13 ENCOUNTER — Encounter: Payer: Self-pay | Admitting: Cardiovascular Disease

## 2016-04-13 ENCOUNTER — Ambulatory Visit (INDEPENDENT_AMBULATORY_CARE_PROVIDER_SITE_OTHER): Payer: Self-pay | Admitting: Cardiovascular Disease

## 2016-04-13 VITALS — BP 126/84 | HR 64 | Ht 74.0 in | Wt 243.8 lb

## 2016-04-13 DIAGNOSIS — E785 Hyperlipidemia, unspecified: Secondary | ICD-10-CM

## 2016-04-13 DIAGNOSIS — I2510 Atherosclerotic heart disease of native coronary artery without angina pectoris: Secondary | ICD-10-CM

## 2016-04-13 LAB — HEPATIC FUNCTION PANEL
ALBUMIN: 4.4 g/dL (ref 3.6–5.1)
ALK PHOS: 51 U/L (ref 40–115)
ALT: 37 U/L (ref 9–46)
AST: 24 U/L (ref 10–35)
BILIRUBIN TOTAL: 0.6 mg/dL (ref 0.2–1.2)
Bilirubin, Direct: 0.1 mg/dL (ref <=0.2)
Indirect Bilirubin: 0.5 mg/dL (ref 0.2–1.2)
Total Protein: 6.6 g/dL (ref 6.1–8.1)

## 2016-04-13 LAB — LIPID PANEL
Cholesterol: 191 mg/dL (ref 125–200)
HDL: 66 mg/dL (ref >=40)
LDL CALC: 112 mg/dL (ref <130)
Total CHOL/HDL Ratio: 2.9 Ratio (ref <=5.0)
Triglycerides: 67 mg/dL (ref <150)
VLDL: 13 mg/dL (ref <30)

## 2016-04-13 MED ORDER — ATORVASTATIN CALCIUM 40 MG PO TABS: 40.0000 mg | ORAL_TABLET | Freq: Every day | ORAL | Status: DC

## 2016-04-13 NOTE — Progress Notes (Signed)
Cardiology Office Note Date:  04/13/2016   ID:  STACY SORBY, DOB [birth year], MRN [id number]  PCP:  [name], DO  Cardiologist:  Sherren Mocha, MD    Chief Complaint  Patient presents with  . Coronary Artery Disease    C/O shortness of breath. Denies cp,lee,or claudication     History of Present Illness: Michael Pearson is a 55 y.o. male who presents for followup evaluation. This gentleman has coronary artery disease and underwent PCI of the LAD in 2012 after presenting with a non-ST elevation infarction. He had no other high-grade coronary disease noted. His left ventricular function was normal. He underwent nuclear stress test in 2014 demonstrating no ischemia.  He is waking up every night with a headache. Pain is in the back of the head. Feels better with a fan blowing in his face and a cold towel on his head. He has been evaluated by his PCP and diagnosed with migraine headaches. There are associated vision problems.   The patient denies chest pain or pressure with exertion. Denies shortness of breath. No edema, heart palpitations, orthopnea, or PND.  Past Medical History  Diagnosis Date  . Migraines     Has been evaluated multiple times in past for chronic headache in which pt has had occasional nose bleeds and bloodshot eyes. This lasted for 4-5 months. CT of the head was negative in May 2012.  Nanine Means ([facility])     MRI in 2008 demonstrating 4.6 mm area of pituitary gland   . Duodenitis     May 2012 with heme positive stools at this time. Endorses only rare streaking of blood now.  . Colitis   . CAD (coronary artery disease)     NSTEMI 7/12:  Cardiac cath on 7/17: pLAD occluded, Dx 30-40%, pRCA 30-40%, mRCA 30-40%.  Proximal LAD was treated with a BMS.  Echo 7/19:  EF 60-65%, mild LVH.    ETT-Myoview 6/14:  Inf thinning, no ischemia, EF 64%, normal study  . RBBB (right bundle branch block)     Noted on EKG in 2008  . Kidney stones   . Hyperlipidemia   .  Hypertension   . Myocardial infarction ([place]) 05/17/2011  . Allergy   . Asthma   . Hiatal hernia   . Ulcer   . Hx of hemorrhoids   . GERD (gastroesophageal reflux disease)   . Arthritis   . Heart murmur     as a child per the pt  . Diverticulosis     Past Surgical History  Procedure Laterality Date  . Hernia repair      In 20's  . Tooth extraction    . Hammer toe surgery    . Egd  03/05/2011    Dr. Benson Norway  . Flexible sigmoidoscopy  03/05/2011    Dr. Benson Norway  . Heart stent      Current Outpatient Prescriptions  Medication Sig Dispense Refill  . aspirin 81 MG tablet Take 81 mg by mouth daily.      . cetirizine (ZYRTEC) 10 MG tablet Take 10 mg by mouth daily.    Marland Kitchen ezetimibe (ZETIA) 10 MG tablet Take 1 tablet (10 mg total) by mouth daily. 30 tablet 2  . ibuprofen (ADVIL,MOTRIN) 800 MG tablet Take 1 tablet (800 mg total) by mouth 3 (three) times daily. 21 tablet 0  . losartan (COZAAR) 50 MG tablet TAKE 1 TABLET (50 MG TOTAL) BY MOUTH DAILY. 30 tablet 5  . metoprolol tartrate (  LOPRESSOR) 25 MG tablet TAKE 1/2 TABLET BY MOUTH TWICE DAILY 30 tablet 5  . Multiple Vitamin (MULTIVITAMIN) tablet Take 1 tablet by mouth daily.      Marland Kitchen omeprazole (PRILOSEC) 40 MG capsule Take 1 capsule (40 mg total) by mouth daily. 30 minutes before breakfast 30 capsule 3  . ranitidine (ZANTAC) 150 MG tablet Take 1 tablet (150 mg total) by mouth at bedtime. 30 tablet 11  . albuterol (PROVENTIL HFA;VENTOLIN HFA) 108 (90 BASE) MCG/ACT inhaler Inhale 2 puffs into the lungs every 4 (four) hours as needed for wheezing or shortness of breath.    Marland Kitchen atorvastatin (LIPITOR) 40 MG tablet Take 1 tablet (40 mg total) by mouth daily. 90 tablet 3  . mometasone (NASONEX) 50 MCG/ACT nasal spray Place 2 sprays into the nose daily. 17 g 2   No current facility-administered medications for this visit.    Allergies:   Review of patient's allergies indicates no known allergies.   Social History:  The patient  reports that he has never  smoked. He has never used smokeless tobacco. He reports that he does not drink alcohol or use illicit drugs.   Family History:  The patient's  family history includes Cancer in his father; Colon cancer in his maternal aunt; Colon polyps in his maternal aunt and paternal grandmother; Coronary artery disease in his brother; Heart attack (age of onset: 10) in his father; Hypertension in his brother; Obesity in his brother; Skin cancer in his father; Stroke in his mother.    ROS:  Please see the history of present illness.  Otherwise, review of systems is positive for headache, visual disturbance, back pain, leg pain, nausea/vomiting in the mornings.  All other systems are reviewed and negative.    PHYSICAL EXAM: VS:  BP 126/84 mmHg  Pulse 64  Ht 6\' 2"  (1.88 m)  Wt 243 lb 12.8 oz (110.587 kg)  BMI 31.29 kg/m2 , BMI Body mass index is 31.29 kg/(m^2). GEN: Well nourished, well developed, in no acute distress HEENT: normal Neck: no JVD, no masses. No carotid bruits Cardiac: RRR without murmur or gallop                Respiratory:  clear to auscultation bilaterally, normal work of breathing GI: soft, nontender, nondistended, + BS MS: no deformity or atrophy Ext: no pretibial edema, pedal pulses 2+= bilaterally Skin: warm and dry, no rash Neuro:  Strength and sensation are intact Psych: euthymic mood, full affect  EKG:  EKG is ordered today. The ekg ordered today shows NSR 64 bpm, RBBB, no other changes noted  Recent Labs: 11/20/2015: ALT 21; BUN 13; Creatinine, Ser 0.84; Hemoglobin 14.9; Platelets 235.0; Potassium 3.9; Sodium 138   Lipid Panel     Component Value Date/Time   CHOL 198 11/20/2015 1609   TRIG 56.0 11/20/2015 1609   HDL 56.20 11/20/2015 1609   CHOLHDL 4 11/20/2015 1609   VLDL 11.2 11/20/2015 1609   LDLCALC 131* 11/20/2015 1609      Wt Readings from Last 3 Encounters:  04/13/16 243 lb 12.8 oz (110.587 kg)  02/17/16 235 lb (106.595 kg)  01/21/16 235 lb 3.2 oz (106.686  kg)     Cardiac Studies Reviewed: Myoview scan 04/26/2013: [facility]  Raw Data Images: Patient motion noted.  Stress Images: Normal homogeneous uptake in all areas of the myocardium.  Rest Images: Normal homogeneous uptake in all areas of the myocardium.  Subtraction (SDS): SDS 3 scatterred  Transient Ischemic Dilatation (Normal <1.22): 0.85  Lung/Heart Ratio (Normal <0.45): 0.27  Quantitative Gated Spect Images  QGS EDV: 114 ml  QGS ESV: 41 ml  Impression  Exercise Capacity: Fair exercise capacity.  BP Response: Normal blood pressure response.  Clinical Symptoms: There is dyspnea.  ECG Impression: No significant ST segment change suggestive of ischemia.  Comparison with Prior Nuclear Study: No images to compare  Overall Impression: Normal stress nuclear study. Thinning of the inferior wall not thought to be significant  LV Ejection Fraction: 64%. LV Wall Motion: NL LV Function; NL Wall Motion  [place] [place]   ASSESSMENT AND PLAN: 1.  CAD, native vessel: no angina. Medications reviewed and he will continue on aspirin and metoprolol. See below for discussion of his lipid lowering.  2. Essential HTN: BP stable and controlled on current Rx.   3. Hyperlipidemia:  Lipids above goal. Will update labs today as he is fasting. Currently taking Zetia. Has been on atorvastatin in the past, but this is no longer on his medicine list. I reviewed notes and do not see any reason it was discontinued. Will advise him to restart atorvastatin 40 mg. Likely will repeat lipids and LFTs in about 3 months.  4. Chronic headache: he has undergone extensive evaluation. Will continue to follow with PCP.   Current medicines are reviewed with the patient today.  The patient does not have concerns regarding medicines.  Labs/ tests ordered today include:   Orders Placed This Encounter  Procedures  . Lipid panel  . Hepatic function panel  . Lipid panel  . Hepatic function panel  . EKG 12-Lead     Disposition:   FU one year  Signed, Sherren Mocha, MD  04/13/2016 12:54 PM    [address], [place], [place]  [address] Phone: [phone]; Fax: [phone]

## 2016-04-13 NOTE — Patient Instructions (Addendum)
Medication Instructions:  Your physician has recommended you make the following change in your medication:  1. START Atorvastatin 40mg  take one tablet by mouth every evening  Labwork: Your physician recommends that you have lab work today: LIPID and LIVER  Your physician recommends that you return for a FASTING LIPID and LIVER in 3 MONTHS--nothing to eat or drink after midnight, lab opens at 7:30 AM  Testing/Procedures: No new orders.   Follow-Up: Your physician wants you to follow-up in: 1 YEAR with Dr Burt Knack. You will receive a reminder letter in the mail two months in advance. If you don't receive a letter, please call our office to schedule the follow-up appointment.   Any Other Special Instructions Will Be Listed Below (If Applicable).     If you need a refill on your cardiac medications before your next appointment, please call your pharmacy.

## 2016-04-15 ENCOUNTER — Telehealth: Payer: Self-pay | Admitting: Cardiovascular Disease

## 2016-04-15 NOTE — Telephone Encounter (Signed)
LM TO CALL BACK ./CY 

## 2016-04-15 NOTE — Telephone Encounter (Signed)
Follow-up      The pt is calling back to get results from his test

## 2016-04-16 NOTE — Telephone Encounter (Signed)
LM TO CALL BACK ./CY 

## 2016-04-19 NOTE — Telephone Encounter (Signed)
Left message on machine for pt to contact the office.   

## 2016-04-20 NOTE — Telephone Encounter (Signed)
F/u  Pt returning RN phone call- can leave detailed VM. Please call back and discuss.

## 2016-04-20 NOTE — Telephone Encounter (Signed)
I spoke with the pt and made him aware of lipid and liver results.  The pt is already scheduled for repeat lab work in September.

## 2016-05-18 ENCOUNTER — Telehealth: Payer: Self-pay

## 2016-05-18 ENCOUNTER — Other Ambulatory Visit: Payer: Self-pay | Admitting: Family Medicine

## 2016-05-18 ENCOUNTER — Ambulatory Visit (INDEPENDENT_AMBULATORY_CARE_PROVIDER_SITE_OTHER): Payer: Self-pay | Admitting: Family Medicine

## 2016-05-18 ENCOUNTER — Encounter: Payer: Self-pay | Admitting: Family Medicine

## 2016-05-18 VITALS — BP 112/86 | HR 69 | Temp 98.2°F | Wt 246.4 lb

## 2016-05-18 DIAGNOSIS — Z91048 Other nonmedicinal substance allergy status: Secondary | ICD-10-CM

## 2016-05-18 DIAGNOSIS — R079 Chest pain, unspecified: Secondary | ICD-10-CM

## 2016-05-18 DIAGNOSIS — R0789 Other chest pain: Secondary | ICD-10-CM

## 2016-05-18 DIAGNOSIS — Z9109 Other allergy status, other than to drugs and biological substances: Secondary | ICD-10-CM

## 2016-05-18 DIAGNOSIS — R519 Headache, unspecified: Secondary | ICD-10-CM

## 2016-05-18 DIAGNOSIS — I10 Essential (primary) hypertension: Secondary | ICD-10-CM

## 2016-05-18 DIAGNOSIS — R51 Headache: Secondary | ICD-10-CM

## 2016-05-18 DIAGNOSIS — E785 Hyperlipidemia, unspecified: Secondary | ICD-10-CM

## 2016-05-18 LAB — CBC WITH DIFFERENTIAL/PLATELET
BASOS PCT: 0.8 % (ref 0.0–3.0)
Eosinophils Relative: 2.9 % (ref 0.0–5.0)
HEMATOCRIT: 44.5 % (ref 39.0–52.0)
Hemoglobin: 14.8 g/dL (ref 13.0–17.0)
LYMPHS PCT: 33.4 % (ref 12.0–46.0)
MCHC: 33.3 g/dL (ref 30.0–36.0)
MCV: 94.8 fl (ref 78.0–100.0)
MONOS PCT: 8.5 % (ref 3.0–12.0)
NEUTROS PCT: 54.4 % (ref 43.0–77.0)
PLATELETS: 214 10*3/uL (ref 150.0–400.0)
RBC: 4.69 Mil/uL (ref 4.22–5.81)
RDW: 14.1 % (ref 11.5–15.5)
WBC: 6.5 10*3/uL (ref 4.0–10.5)

## 2016-05-18 LAB — COMPREHENSIVE METABOLIC PANEL
ALK PHOS: 55 U/L (ref 39–117)
ALT: 44 U/L (ref 0–53)
AST: 28 U/L (ref 0–37)
Albumin: 4.5 g/dL (ref 3.5–5.2)
BILIRUBIN TOTAL: 0.8 mg/dL (ref 0.2–1.2)
BUN: 15 mg/dL (ref 6–23)
CALCIUM: 9.9 mg/dL (ref 8.4–10.5)
CO2: 28 mEq/L (ref 19–32)
Chloride: 103 mEq/L (ref 96–112)
Creatinine, Ser: 0.74 mg/dL (ref 0.40–1.50)
GFR: 116.55 mL/min (ref >60.00)
Glucose, Bld: 98 mg/dL (ref 70–99)
Potassium: 4 mEq/L (ref 3.5–5.1)
Sodium: 137 mEq/L (ref 135–145)
Total Protein: 7.3 g/dL (ref 6.0–8.3)

## 2016-05-18 LAB — SEDIMENTATION RATE: Sed Rate: 8 mm/hr (ref 0–20)

## 2016-05-18 LAB — LIPID PANEL
CHOL/HDL RATIO: 2
CHOLESTEROL: 132 mg/dL (ref 0–200)
HDL: 57 mg/dL (ref >39.00)
LDL CALC: 62 mg/dL (ref 0–99)
NonHDL: 74.68
Triglycerides: 65 mg/dL (ref 0.0–149.0)
VLDL: 13 mg/dL (ref 0.0–40.0)

## 2016-05-18 MED ORDER — LOSARTAN POTASSIUM 50 MG PO TABS: ORAL_TABLET | ORAL | Status: DC

## 2016-05-18 MED ORDER — MOMETASONE FUROATE 50 MCG/ACT NA SUSP: 2.0000 | Freq: Every day | NASAL | Status: DC

## 2016-05-18 MED ORDER — EZETIMIBE 10 MG PO TABS: 10.0000 mg | ORAL_TABLET | Freq: Every day | ORAL | Status: DC

## 2016-05-18 MED ORDER — METOPROLOL TARTRATE 25 MG PO TABS: 12.5000 mg | ORAL_TABLET | Freq: Two times a day (BID) | ORAL | Status: DC

## 2016-05-18 NOTE — Telephone Encounter (Signed)
Message has been left to return my call to make the patient aware.     KP

## 2016-05-18 NOTE — Progress Notes (Signed)
Patient ID: Michael Pearson, male    DOB: 11-15-1960  Age: 55 y.o. MRN: [id number]    Subjective:  Subjective HPI TIMOHTY LORETTE presents for c/o worsening headaches.  He has had headaches for years but recently they have worsened.  The last one he had seemed to improve with otc sinus med but the headaches now are daily and Sunday the cp was so back it caused chest pain for 15 min and then it resolved-- he was in bed at the time-- around 4am.  He has had no cp since.      Review of Systems  Constitutional: Negative for diaphoresis, appetite change, fatigue and unexpected weight change.  Eyes: Negative for pain, redness and visual disturbance.  Respiratory: Negative for cough, chest tightness, shortness of breath and wheezing.   Cardiovascular: Negative for chest pain, palpitations and leg swelling.  Endocrine: Negative for cold intolerance, heat intolerance, polydipsia, polyphagia and polyuria.  Genitourinary: Negative for dysuria, frequency and difficulty urinating.  Neurological: Negative for dizziness, light-headedness, numbness and headaches.    History Past Medical History  Diagnosis Date  . Migraines     Has been evaluated multiple times in past for chronic headache in which pt has had occasional nose bleeds and bloodshot eyes. This lasted for 4-5 months. CT of the head was negative in May 2012.  Nanine Means ([place])     MRI in 2008 demonstrating 4.6 mm area of pituitary gland   . Duodenitis     May 2012 with heme positive stools at this time. Endorses only rare streaking of blood now.  . Colitis   . CAD (coronary artery disease)     NSTEMI 7/12:  Cardiac cath on 7/17: pLAD occluded, Dx 30-40%, pRCA 30-40%, mRCA 30-40%.  Proximal LAD was treated with a BMS.  Echo 7/19:  EF 60-65%, mild LVH.    ETT-Myoview 6/14:  Inf thinning, no ischemia, EF 64%, normal study  . RBBB (right bundle branch block)     Noted on EKG in 2008  . Kidney stones   . Hyperlipidemia   . Hypertension   .  Myocardial infarction ([place]) 05/17/2011  . Allergy   . Asthma   . Hiatal hernia   . Ulcer   . Hx of hemorrhoids   . GERD (gastroesophageal reflux disease)   . Arthritis   . Heart murmur     as a child per the pt  . Diverticulosis     He has past surgical history that includes Hernia repair; Tooth extraction; Hammer toe surgery; egd (03/05/2011); Flexible sigmoidoscopy (03/05/2011); and Heart stent.   His family history includes Cancer in his father; Colon cancer in his maternal aunt; Colon polyps in his maternal aunt and paternal grandmother; Coronary artery disease in his brother; Heart attack (age of onset: 8) in his father; Hypertension in his brother; Obesity in his brother; Skin cancer in his father; Stroke in his mother.He reports that he has never smoked. He has never used smokeless tobacco. He reports that he does not drink alcohol or use illicit drugs.  Current Outpatient Prescriptions on File Prior to Visit  Medication Sig Dispense Refill  . albuterol (PROVENTIL HFA;VENTOLIN HFA) 108 (90 BASE) MCG/ACT inhaler Inhale 2 puffs into the lungs every 4 (four) hours as needed for wheezing or shortness of breath.    Marland Kitchen aspirin 81 MG tablet Take 81 mg by mouth daily.      Marland Kitchen atorvastatin (LIPITOR) 40 MG tablet Take 1 tablet (40 mg total)  by mouth daily. 90 tablet 3  . cetirizine (ZYRTEC) 10 MG tablet Take 10 mg by mouth daily.    Marland Kitchen ibuprofen (ADVIL,MOTRIN) 800 MG tablet Take 1 tablet (800 mg total) by mouth 3 (three) times daily. 21 tablet 0  . Multiple Vitamin (MULTIVITAMIN) tablet Take 1 tablet by mouth daily.      Marland Kitchen omeprazole (PRILOSEC) 40 MG capsule Take 1 capsule (40 mg total) by mouth daily. 30 minutes before breakfast 30 capsule 3  . ranitidine (ZANTAC) 150 MG tablet Take 1 tablet (150 mg total) by mouth at bedtime. 30 tablet 11   No current facility-administered medications on file prior to visit.    ekg -- no acute changes-- RBBB Objective:  Objective Physical Exam    Constitutional: He is oriented to person, place, and time. Vital signs are normal. He appears well-developed and well-nourished. He is sleeping.  HENT:  Head: Normocephalic and atraumatic.  Mouth/Throat: Oropharynx is clear and moist.  Eyes: EOM are normal. Pupils are equal, round, and reactive to light.  Neck: Normal range of motion. Neck supple. No thyromegaly present.  Cardiovascular: Normal rate and regular rhythm.   No murmur heard. Pulmonary/Chest: Effort normal and breath sounds normal. No respiratory distress. He has no wheezes. He has no rales. He exhibits no tenderness.  Musculoskeletal: He exhibits no edema or tenderness.  Neurological: He is alert and oriented to person, place, and time.  Skin: Skin is warm and dry.  Psychiatric: He has a normal mood and affect. His behavior is normal. Judgment and thought content normal.   BP 112/86 mmHg  Pulse 69  Temp(Src) 98.2 F (36.8 C) (Oral)  Wt 246 lb 6.4 oz (111.766 kg)  SpO2 98% Wt Readings from Last 3 Encounters:  05/18/16 246 lb 6.4 oz (111.766 kg)  04/13/16 243 lb 12.8 oz (110.587 kg)  02/17/16 235 lb (106.595 kg)     Lab Results  Component Value Date   WBC 6.5 05/18/2016   HGB 14.8 05/18/2016   HCT 44.5 05/18/2016   PLT 214.0 05/18/2016   GLUCOSE 98 05/18/2016   CHOL 132 05/18/2016   TRIG 65.0 05/18/2016   HDL 57.00 05/18/2016   LDLCALC 62 05/18/2016   ALT 44 05/18/2016   AST 28 05/18/2016   NA 137 05/18/2016   K 4.0 05/18/2016   CL 103 05/18/2016   CREATININE 0.74 05/18/2016   BUN 15 05/18/2016   CO2 28 05/18/2016   TSH 0.98 11/03/2012   PSA 0.26 11/03/2012   INR 0.97 05/17/2011   HGBA1C 5.8 09/17/2014   MICROALBUR 1.5 11/20/2015  ekg-- no change from previous  Dg Finger Index Right  12/28/2015  CLINICAL DATA:  Patient with injury to the right index finger with chain saw. Laceration. Initial encounter. EXAM: RIGHT INDEX FINGER 2+V COMPARISON:  None. FINDINGS: Normal anatomic alignment. No evidence  for acute fracture or dislocation. No radiopaque foreign body. Overlying bandaging material. IMPRESSION: No radiopaque foreign body. No acute osseous abnormality. Electronically Signed   By: Lovey Newcomer M.D.   On: 12/28/2015 13:31     Assessment & Plan:  Plan I have changed Mr. Plumer metoprolol tartrate. I am also having him maintain his aspirin, multivitamin, albuterol, cetirizine, ibuprofen, omeprazole, ranitidine, atorvastatin, mometasone, losartan, and ezetimibe.  Meds ordered this encounter  Medications  . mometasone (NASONEX) 50 MCG/ACT nasal spray    Sig: Place 2 sprays into the nose daily.    Dispense:  17 g    Refill:  2  . metoprolol  tartrate (LOPRESSOR) 25 MG tablet    Sig: Take 0.5 tablets (12.5 mg total) by mouth 2 (two) times daily.    Dispense:  30 tablet    Refill:  5  . losartan (COZAAR) 50 MG tablet    Sig: TAKE 1 TABLET (50 MG TOTAL) BY MOUTH DAILY.    Dispense:  30 tablet    Refill:  5  . ezetimibe (ZETIA) 10 MG tablet    Sig: Take 1 tablet (10 mg total) by mouth daily.    Dispense:  30 tablet    Refill:  2    Problem List Items Addressed This Visit      Unprioritized   HTN (hypertension)   Relevant Medications   metoprolol tartrate (LOPRESSOR) 25 MG tablet   losartan (COZAAR) 50 MG tablet   ezetimibe (ZETIA) 10 MG tablet    Other Visit Diagnoses    Chest pain, unspecified chest pain type    -  Primary    Relevant Orders    EKG 12-Lead (Completed)    Frequent headaches        Relevant Medications    metoprolol tartrate (LOPRESSOR) 25 MG tablet    Other Relevant Orders    MR Brain W Wo Contrast    CBC with Differential/Platelet (Completed)    Comprehensive metabolic panel (Completed)    Sedimentation rate (Completed)    Hyperlipidemia        Relevant Medications    metoprolol tartrate (LOPRESSOR) 25 MG tablet    losartan (COZAAR) 50 MG tablet    ezetimibe (ZETIA) 10 MG tablet    Other Relevant Orders    Lipid panel (Completed)     Comprehensive metabolic panel (Completed)    Environmental allergies        Relevant Medications    mometasone (NASONEX) 50 MCG/ACT nasal spray       Follow-up: Return in about 6 months (around 11/18/2016) for annual exam, fasting.  [name], DO

## 2016-05-18 NOTE — Patient Instructions (Signed)
Nonspecific Chest Pain  °Chest pain can be caused by many different conditions. There is always a chance that your pain could be related to something serious, such as a heart attack or a blood clot in your lungs. Chest pain can also be caused by conditions that are not life-threatening. If you have chest pain, it is very important to follow up with your health care provider. °CAUSES  °Chest pain can be caused by: °· Heartburn. °· Pneumonia or bronchitis. °· Anxiety or stress. °· Inflammation around your heart (pericarditis) or lung (pleuritis or pleurisy). °· A blood clot in your lung. °· A collapsed lung (pneumothorax). It can develop suddenly on its own (spontaneous pneumothorax) or from trauma to the chest. °· Shingles infection (varicella-zoster virus). °· Heart attack. °· Damage to the bones, muscles, and cartilage that make up your chest wall. This can include: °¨ Bruised bones due to injury. °¨ Strained muscles or cartilage due to frequent or repeated coughing or overwork. °¨ Fracture to one or more ribs. °¨ Sore cartilage due to inflammation (costochondritis). °RISK FACTORS  °Risk factors for chest pain may include: °· Activities that increase your risk for trauma or injury to your chest. °· Respiratory infections or conditions that cause frequent coughing. °· Medical conditions or overeating that can cause heartburn. °· Heart disease or family history of heart disease. °· Conditions or health behaviors that increase your risk of developing a blood clot. °· Having had chicken pox (varicella zoster). °SIGNS AND SYMPTOMS °Chest pain can feel like: °· Burning or tingling on the surface of your chest or deep in your chest. °· Crushing, pressure, aching, or squeezing pain. °· Dull or sharp pain that is worse when you move, cough, or take a deep breath. °· Pain that is also felt in your back, neck, shoulder, or arm, or pain that spreads to any of these areas. °Your chest pain may come and go, or it may stay  constant. °DIAGNOSIS °Lab tests or other studies may be needed to find the cause of your pain. Your health care provider may have you take a test called an ambulatory ECG (electrocardiogram). An ECG records your heartbeat patterns at the time the test is performed. You may also have other tests, such as: °· Transthoracic echocardiogram (TTE). During echocardiography, sound waves are used to create a picture of all of the heart structures and to look at how blood flows through your heart. °· Transesophageal echocardiogram (TEE). This is a more advanced imaging test that obtains images from inside your body. It allows your health care provider to see your heart in finer detail. °· Cardiac monitoring. This allows your health care provider to monitor your heart rate and rhythm in real time. °· Holter monitor. This is a portable device that records your heartbeat and can help to diagnose abnormal heartbeats. It allows your health care provider to track your heart activity for several days, if needed. °· Stress tests. These can be done through exercise or by taking medicine that makes your heart beat more quickly. °· Blood tests. °· Imaging tests. °TREATMENT  °Your treatment depends on what is causing your chest pain. Treatment may include: °· Medicines. These may include: °¨ Acid blockers for heartburn. °¨ Anti-inflammatory medicine. °¨ Pain medicine for inflammatory conditions. °¨ Antibiotic medicine, if an infection is present. °¨ Medicines to dissolve blood clots. °¨ Medicines to treat coronary artery disease. °· Supportive care for conditions that do not require medicines. This may include: °¨ Resting. °¨ Applying heat   or cold packs to injured areas. °¨ Limiting activities until pain decreases. °HOME CARE INSTRUCTIONS °· If you were prescribed an antibiotic medicine, finish it all even if you start to feel better. °· Avoid any activities that bring on chest pain. °· Do not use any tobacco products, including  cigarettes, chewing tobacco, or electronic cigarettes. If you need help quitting, ask your health care provider. °· Do not drink alcohol. °· Take medicines only as directed by your health care provider. °· Keep all follow-up visits as directed by your health care provider. This is important. This includes any further testing if your chest pain does not go away. °· If heartburn is the cause for your chest pain, you may be told to keep your head raised (elevated) while sleeping. This reduces the chance that acid will go from your stomach into your esophagus. °· Make lifestyle changes as directed by your health care provider. These may include: °¨ Getting regular exercise. Ask your health care provider to suggest some activities that are safe for you. °¨ Eating a heart-healthy diet. A registered dietitian can help you to learn healthy eating options. °¨ Maintaining a healthy weight. °¨ Managing diabetes, if necessary. °¨ Reducing stress. °SEEK MEDICAL CARE IF: °· Your chest pain does not go away after treatment. °· You have a rash with blisters on your chest. °· You have a fever. °SEEK IMMEDIATE MEDICAL CARE IF:  °· Your chest pain is worse. °· You have an increasing cough, or you cough up blood. °· You have severe abdominal pain. °· You have severe weakness. °· You faint. °· You have chills. °· You have sudden, unexplained chest discomfort. °· You have sudden, unexplained discomfort in your arms, back, neck, or jaw. °· You have shortness of breath at any time. °· You suddenly start to sweat, or your skin gets clammy. °· You feel nauseous or you vomit. °· You suddenly feel light-headed or dizzy. °· Your heart begins to beat quickly, or it feels like it is skipping beats. °These symptoms may represent a serious problem that is an emergency. Do not wait to see if the symptoms will go away. Get medical help right away. Call your local emergency services (911 in the [place] Do not drive yourself to the hospital. °  °This  information is not intended to replace advice given to you by your health care provider. Make sure you discuss any questions you have with your health care provider. °  °Document Released: 07/28/2005 Document Revised: 11/08/2014 Document Reviewed: 05/24/2014 °Elsevier Interactive Patient Education ©2016 [employer] ° °

## 2016-05-18 NOTE — Progress Notes (Signed)
Pre visit review using our clinic review tool, if applicable. No additional management support is needed unless otherwise documented below in the visit note. 

## 2016-05-18 NOTE — Telephone Encounter (Signed)
-----   Message from [name], DO sent at 05/18/2016  2:08 PM EDT ----- Let pt know I discussed his chest pain with Dr Burt Knack and we are scheduling a stress test.

## 2016-05-19 DIAGNOSIS — R079 Chest pain, unspecified: Secondary | ICD-10-CM | POA: Insufficient documentation

## 2016-05-19 NOTE — Assessment & Plan Note (Signed)
Hx MI/ CAD--- will repeat stress test D/w with cardiology

## 2016-05-19 NOTE — Telephone Encounter (Signed)
Pt notified of below and verbalized understanding. No further questions or concerns at time of call.

## 2016-05-19 NOTE — Telephone Encounter (Signed)
Patient returning Kim's call from yesterday to find out what he needs to do about his chest pain. Please call back with information below.

## 2016-06-02 ENCOUNTER — Telehealth: Payer: Self-pay | Admitting: Cardiovascular Disease

## 2016-06-02 NOTE — Telephone Encounter (Signed)
Michael Pearson is returning a call . Please call   Thanks

## 2016-06-02 NOTE — Telephone Encounter (Signed)
This pt is returning Regina's call to schedule myoview.

## 2016-06-03 NOTE — Telephone Encounter (Signed)
Close encounter 

## 2016-06-07 ENCOUNTER — Telehealth (HOSPITAL_COMMUNITY): Payer: Self-pay | Admitting: *Deleted

## 2016-06-07 NOTE — Telephone Encounter (Signed)
Left message on voicemail in reference to upcoming appointment scheduled for 06/09/16. Phone number given for a call back so details instructions can be given. Michael Pearson W   

## 2016-06-08 ENCOUNTER — Telehealth (HOSPITAL_COMMUNITY): Payer: Self-pay | Admitting: Radiology

## 2016-06-08 NOTE — Telephone Encounter (Signed)
Patient given detailed instructions per Myocardial Perfusion Study Information Sheet for the test on 06/09/16 at 715. Patient notified to arrive 15 minutes early and that it is imperative to arrive on time for appointment to keep from having the test rescheduled.  If you need to cancel or reschedule your appointment, please call the office within 24 hours of your appointment. Failure to do so may result in a cancellation of your appointment, and a $50 no show fee. Patient verbalized understanding.Michael Pearson

## 2016-06-09 ENCOUNTER — Ambulatory Visit (HOSPITAL_COMMUNITY): Payer: Self-pay | Attending: Family Medicine

## 2016-06-09 DIAGNOSIS — I10 Essential (primary) hypertension: Secondary | ICD-10-CM | POA: Insufficient documentation

## 2016-06-09 DIAGNOSIS — R0609 Other forms of dyspnea: Secondary | ICD-10-CM | POA: Insufficient documentation

## 2016-06-09 DIAGNOSIS — Z8249 Family history of ischemic heart disease and other diseases of the circulatory system: Secondary | ICD-10-CM | POA: Insufficient documentation

## 2016-06-09 DIAGNOSIS — R079 Chest pain, unspecified: Secondary | ICD-10-CM | POA: Insufficient documentation

## 2016-06-09 LAB — MYOCARDIAL PERFUSION IMAGING
CHL CUP NUCLEAR SRS: 4
CHL CUP RESTING HR STRESS: 55 {beats}/min
CSEPPHR: 81 {beats}/min
LV dias vol: 138 mL (ref 62–150)
LVSYSVOL: 53 mL
NUC STRESS TID: 1
RATE: 0.29
SDS: 4
SSS: 5

## 2016-06-09 MED ORDER — TECHNETIUM TC 99M TETROFOSMIN IV KIT
10.3000 | PACK | Freq: Once | INTRAVENOUS | Status: AC | PRN
  Administered 2016-06-09: 10 via INTRAVENOUS
  Filled 2016-06-09: qty 10

## 2016-06-09 MED ORDER — REGADENOSON 0.4 MG/5ML IV SOLN
0.4000 mg | Freq: Once | INTRAVENOUS | Status: AC
  Administered 2016-06-09: 0.4 mg via INTRAVENOUS

## 2016-06-09 MED ORDER — TECHNETIUM TC 99M TETROFOSMIN IV KIT
32.8000 | PACK | Freq: Once | INTRAVENOUS | Status: AC | PRN
  Administered 2016-06-09: 32.8 via INTRAVENOUS
  Filled 2016-06-09: qty 33

## 2016-06-19 ENCOUNTER — Ambulatory Visit (HOSPITAL_BASED_OUTPATIENT_CLINIC_OR_DEPARTMENT_OTHER)
Admission: RE | Admit: 2016-06-19 | Discharge: 2016-06-19 | Disposition: A | Discharge status: Home / Self Care | Payer: Self-pay | Source: Ambulatory Visit | Attending: Family Medicine | Admitting: Family Medicine

## 2016-06-19 DIAGNOSIS — R51 Headache: Secondary | ICD-10-CM | POA: Insufficient documentation

## 2016-06-19 DIAGNOSIS — R519 Headache, unspecified: Secondary | ICD-10-CM

## 2016-06-19 DIAGNOSIS — D352 Benign neoplasm of pituitary gland: Secondary | ICD-10-CM | POA: Insufficient documentation

## 2016-06-19 MED ORDER — GADOBENATE DIMEGLUMINE 529 MG/ML IV SOLN
20.0000 mL | Freq: Once | INTRAVENOUS | Status: AC | PRN
  Administered 2016-06-19: 20 mL via INTRAVENOUS

## 2016-06-22 ENCOUNTER — Telehealth: Payer: Self-pay | Admitting: Family Medicine

## 2016-06-22 ENCOUNTER — Other Ambulatory Visit: Payer: Self-pay

## 2016-06-22 DIAGNOSIS — D352 Benign neoplasm of pituitary gland: Secondary | ICD-10-CM

## 2016-06-22 NOTE — Telephone Encounter (Signed)
Pt is returning CMA's call, pt says that it is for results.

## 2016-06-22 NOTE — Telephone Encounter (Signed)
Notes Recorded by [name], DO on 06/19/2016 at 7:59 PM EDT Macroadenoma-- on pituitary gland-- enlarging---- refer to neurosurgery   Patient has been made aware and verbalized understanding.     KP

## 2016-07-09 ENCOUNTER — Institutional Professional Consult (permissible substitution): Payer: Self-pay | Admitting: Radiation Oncology

## 2016-07-12 ENCOUNTER — Encounter: Payer: Self-pay | Admitting: Genetic Counselor

## 2016-07-14 ENCOUNTER — Encounter (INDEPENDENT_AMBULATORY_CARE_PROVIDER_SITE_OTHER): Payer: Self-pay

## 2016-07-14 ENCOUNTER — Other Ambulatory Visit: Payer: Self-pay | Admitting: *Deleted

## 2016-07-14 DIAGNOSIS — E785 Hyperlipidemia, unspecified: Secondary | ICD-10-CM

## 2016-07-14 DIAGNOSIS — I2510 Atherosclerotic heart disease of native coronary artery without angina pectoris: Secondary | ICD-10-CM

## 2016-07-14 LAB — HEPATIC FUNCTION PANEL
ALT: 37 U/L (ref 9–46)
AST: 24 U/L (ref 10–35)
Albumin: 4.5 g/dL (ref 3.6–5.1)
Alkaline Phosphatase: 63 U/L (ref 40–115)
BILIRUBIN INDIRECT: 0.6 mg/dL (ref 0.2–1.2)
BILIRUBIN TOTAL: 0.8 mg/dL (ref 0.2–1.2)
Bilirubin, Direct: 0.2 mg/dL (ref <=0.2)
TOTAL PROTEIN: 7.1 g/dL (ref 6.1–8.1)

## 2016-07-14 LAB — LIPID PANEL
Cholesterol: 132 mg/dL (ref 125–200)
HDL: 58 mg/dL (ref >=40)
LDL CALC: 60 mg/dL (ref <130)
TRIGLYCERIDES: 72 mg/dL (ref <150)
Total CHOL/HDL Ratio: 2.3 Ratio (ref <=5.0)
VLDL: 14 mg/dL (ref <30)

## 2016-07-19 ENCOUNTER — Encounter: Payer: Self-pay | Admitting: Cardiovascular Disease

## 2016-07-19 NOTE — Telephone Encounter (Signed)
Michael Pearson is returning a call  about his lab work . Please call   Thanks

## 2016-07-19 NOTE — Telephone Encounter (Signed)
This encounter was created in error - please disregard.

## 2016-08-19 ENCOUNTER — Other Ambulatory Visit: Payer: Self-pay | Admitting: Family Medicine

## 2016-08-19 NOTE — Telephone Encounter (Signed)
Medication filled to pharmacy as requested.   

## 2016-11-23 ENCOUNTER — Encounter: Payer: Self-pay | Admitting: Family Medicine

## 2016-11-23 ENCOUNTER — Ambulatory Visit (INDEPENDENT_AMBULATORY_CARE_PROVIDER_SITE_OTHER): Payer: Self-pay | Admitting: Family Medicine

## 2016-11-23 VITALS — BP 122/88 | HR 62 | Temp 97.8°F | Resp 16 | Ht 73.8 in | Wt 230.6 lb

## 2016-11-23 DIAGNOSIS — J4520 Mild intermittent asthma, uncomplicated: Secondary | ICD-10-CM

## 2016-11-23 DIAGNOSIS — K219 Gastro-esophageal reflux disease without esophagitis: Secondary | ICD-10-CM

## 2016-11-23 DIAGNOSIS — Z599 Problem related to housing and economic circumstances, unspecified: Secondary | ICD-10-CM

## 2016-11-23 DIAGNOSIS — R350 Frequency of micturition: Secondary | ICD-10-CM

## 2016-11-23 DIAGNOSIS — I214 Non-ST elevation (NSTEMI) myocardial infarction: Secondary | ICD-10-CM

## 2016-11-23 DIAGNOSIS — Z0000 Encounter for general adult medical examination without abnormal findings: Secondary | ICD-10-CM

## 2016-11-23 DIAGNOSIS — I2510 Atherosclerotic heart disease of native coronary artery without angina pectoris: Secondary | ICD-10-CM

## 2016-11-23 DIAGNOSIS — E785 Hyperlipidemia, unspecified: Secondary | ICD-10-CM

## 2016-11-23 DIAGNOSIS — Z23 Encounter for immunization: Secondary | ICD-10-CM

## 2016-11-23 DIAGNOSIS — I10 Essential (primary) hypertension: Secondary | ICD-10-CM

## 2016-11-23 LAB — LIPID PANEL
CHOLESTEROL: 117 mg/dL (ref 0–200)
HDL: 53.2 mg/dL (ref >39.00)
LDL Cholesterol: 51 mg/dL (ref 0–99)
NonHDL: 63.7
Total CHOL/HDL Ratio: 2
Triglycerides: 64 mg/dL (ref 0.0–149.0)
VLDL: 12.8 mg/dL (ref 0.0–40.0)

## 2016-11-23 LAB — COMPREHENSIVE METABOLIC PANEL
ALBUMIN: 4.4 g/dL (ref 3.5–5.2)
ALK PHOS: 60 U/L (ref 39–117)
ALT: 28 U/L (ref 0–53)
AST: 21 U/L (ref 0–37)
BUN: 9 mg/dL (ref 6–23)
CHLORIDE: 102 meq/L (ref 96–112)
CO2: 31 mEq/L (ref 19–32)
Calcium: 9.7 mg/dL (ref 8.4–10.5)
Creatinine, Ser: 0.76 mg/dL (ref 0.40–1.50)
GFR: 112.8 mL/min (ref >60.00)
Glucose, Bld: 98 mg/dL (ref 70–99)
Potassium: 4.1 mEq/L (ref 3.5–5.1)
SODIUM: 138 meq/L (ref 135–145)
TOTAL PROTEIN: 6.8 g/dL (ref 6.0–8.3)
Total Bilirubin: 0.8 mg/dL (ref 0.2–1.2)

## 2016-11-23 LAB — CBC
HEMATOCRIT: 42.8 % (ref 39.0–52.0)
Hemoglobin: 14.2 g/dL (ref 13.0–17.0)
MCHC: 33.2 g/dL (ref 30.0–36.0)
MCV: 94.3 fl (ref 78.0–100.0)
Platelets: 189 10*3/uL (ref 150.0–400.0)
RBC: 4.54 Mil/uL (ref 4.22–5.81)
RDW: 13.7 % (ref 11.5–15.5)
WBC: 5.5 10*3/uL (ref 4.0–10.5)

## 2016-11-23 LAB — PSA: PSA: 0.33 ng/mL (ref 0.10–4.00)

## 2016-11-23 MED ORDER — METOPROLOL TARTRATE 25 MG PO TABS: 12.5000 mg | ORAL_TABLET | Freq: Two times a day (BID) | ORAL | 5 refills | Status: DC

## 2016-11-23 MED ORDER — ALBUTEROL SULFATE HFA 108 (90 BASE) MCG/ACT IN AERS: 2.0000 | INHALATION_SPRAY | RESPIRATORY_TRACT | 2 refills | Status: DC | PRN

## 2016-11-23 MED ORDER — LOSARTAN POTASSIUM 50 MG PO TABS: 50.0000 mg | ORAL_TABLET | Freq: Every day | ORAL | 5 refills | Status: DC

## 2016-11-23 MED ORDER — ATORVASTATIN CALCIUM 40 MG PO TABS: 40.0000 mg | ORAL_TABLET | Freq: Every day | ORAL | 3 refills | Status: DC

## 2016-11-23 MED ORDER — EZETIMIBE 10 MG PO TABS: 10.0000 mg | ORAL_TABLET | Freq: Every day | ORAL | 2 refills | Status: DC

## 2016-11-23 NOTE — Progress Notes (Signed)
Patient ID: Michael Pearson, male    DOB: January 04, 1961  Age: 56 y.o. MRN: [id number]    Subjective:  Subjective  HPI Michael Pearson presents for cpe.  He stopped taking all his meds due to no ins and no income.    Review of Systems  Constitutional: Negative.   HENT: Negative for congestion, ear pain, hearing loss, nosebleeds, postnasal drip, rhinorrhea, sinus pressure, sneezing and tinnitus.   Eyes: Negative for photophobia, discharge, itching and visual disturbance.  Respiratory: Negative.   Cardiovascular: Negative.   Gastrointestinal: Negative for abdominal distention, abdominal pain, anal bleeding, blood in stool and constipation.  Endocrine: Negative.   Genitourinary: Negative.   Musculoskeletal: Negative.   Skin: Negative.   Allergic/Immunologic: Negative.   Neurological: Negative for dizziness, weakness, light-headedness, numbness and headaches.  Psychiatric/Behavioral: Negative for agitation, confusion, decreased concentration, dysphoric mood, sleep disturbance and suicidal ideas. The patient is not nervous/anxious.     History Past Medical History:  Diagnosis Date  . Allergy   . Arthritis   . Asthma   . CAD (coronary artery disease)    NSTEMI 7/12:  Cardiac cath on 7/17: pLAD occluded, Dx 30-40%, pRCA 30-40%, mRCA 30-40%.  Proximal LAD was treated with a BMS.  Echo 7/19:  EF 60-65%, mild LVH.    ETT-Myoview 6/14:  Inf thinning, no ischemia, EF 64%, normal study  . Colitis   . Diverticulosis   . Duodenitis    May 2012 with heme positive stools at this time. Endorses only rare streaking of blood now.  Marland Kitchen GERD (gastroesophageal reflux disease)   . Heart murmur    as a child per the pt  . Hiatal hernia   . Hx of hemorrhoids   . Hyperlipidemia   . Hypertension   . Kidney stones   . Microadenoma ([place])    MRI in 2008 demonstrating 4.6 mm area of pituitary gland   . Migraines    Has been evaluated multiple times in past for chronic headache in which pt has had occasional nose  bleeds and bloodshot eyes. This lasted for 4-5 months. CT of the head was negative in May 2012.  . Myocardial infarction 05/17/2011  . RBBB (right bundle branch block)    Noted on EKG in 2008  . Ulcer [facility])     He has a past surgical history that includes Hernia repair; Tooth extraction; Hammer toe surgery; egd (03/05/2011); Flexible sigmoidoscopy (03/05/2011); and Heart stent.   His family history includes Cancer in his father; Colon cancer in his maternal aunt; Colon polyps in his maternal aunt and paternal grandmother; Coronary artery disease in his brother; Heart attack (age of onset: 5) in his father; Hypertension in his brother; Obesity in his brother; Skin cancer in his father; Stroke in his mother.He reports that he has never smoked. He has never used smokeless tobacco. He reports that he does not drink alcohol or use drugs.  Current Outpatient Prescriptions on File Prior to Visit  Medication Sig Dispense Refill  . aspirin 81 MG tablet Take 81 mg by mouth daily.      . cetirizine (ZYRTEC) 10 MG tablet Take 10 mg by mouth daily.    . mometasone (NASONEX) 50 MCG/ACT nasal spray Place 2 sprays into the nose daily. (Patient not taking: Reported on 11/23/2016) 17 g 2  . omeprazole (PRILOSEC) 40 MG capsule Take 1 capsule (40 mg total) by mouth daily. 30 minutes before breakfast (Patient not taking: Reported on 11/23/2016) 30 capsule 3   No  current facility-administered medications on file prior to visit.      Objective:  Objective  Physical Exam  Constitutional: He is oriented to person, place, and time. He appears well-developed and well-nourished. No distress.  HENT:  Head: Normocephalic and atraumatic.  Right Ear: External ear normal.  Left Ear: External ear normal.  Nose: Nose normal.  Mouth/Throat: Oropharynx is clear and moist. No oropharyngeal exudate.  Eyes: Conjunctivae and EOM are normal. Pupils are equal, round, and reactive to light. Right eye exhibits no discharge. Left eye  exhibits no discharge.  Neck: Normal range of motion. Neck supple. No JVD present. No thyromegaly present.  Cardiovascular: Normal rate, regular rhythm and intact distal pulses.  Exam reveals no gallop and no friction rub.   No murmur heard. Pulmonary/Chest: Effort normal and breath sounds normal. No respiratory distress. He has no wheezes. He has no rales. He exhibits no tenderness.  Abdominal: Soft. Bowel sounds are normal. He exhibits no distension and no mass. There is no tenderness. There is no rebound and no guarding.  Genitourinary: Rectum normal, prostate normal and penis normal. Rectal exam shows guaiac negative stool.  Musculoskeletal: Normal range of motion. He exhibits no edema or tenderness.  Lymphadenopathy:    He has no cervical adenopathy.  Neurological: He is alert and oriented to person, place, and time. He displays normal reflexes. He exhibits normal muscle tone.  Skin: Skin is warm and dry. No rash noted. He is not diaphoretic. No erythema. No pallor.  Psychiatric: He has a normal mood and affect. His behavior is normal. Judgment and thought content normal.  Nursing note and vitals reviewed.  BP 122/88 (BP Location: Left Arm, Cuff Size: Normal)   Pulse 62   Temp 97.8 F (36.6 C) (Oral)   Resp 16   Ht 6' 1.8" (1.875 m)   Wt 230 lb 9.6 oz (104.6 kg)   SpO2 95%   BMI 29.77 kg/m  Wt Readings from Last 3 Encounters:  11/23/16 230 lb 9.6 oz (104.6 kg)  06/09/16 246 lb (111.6 kg)  05/18/16 246 lb 6.4 oz (111.8 kg)     Lab Results  Component Value Date   WBC 5.5 11/23/2016   HGB 14.2 11/23/2016   HCT 42.8 11/23/2016   PLT 189.0 11/23/2016   GLUCOSE 98 11/23/2016   CHOL 117 11/23/2016   TRIG 64.0 11/23/2016   HDL 53.20 11/23/2016   LDLCALC 51 11/23/2016   ALT 28 11/23/2016   AST 21 11/23/2016   NA 138 11/23/2016   K 4.1 11/23/2016   CL 102 11/23/2016   CREATININE 0.76 11/23/2016   BUN 9 11/23/2016   CO2 31 11/23/2016   TSH 0.98 11/03/2012   PSA 0.33  11/23/2016   INR 0.97 05/17/2011   HGBA1C 5.8 09/17/2014   MICROALBUR 1.5 11/20/2015    Mr Brain W Wo Contrast  Result Date: 06/19/2016 CLINICAL DATA:  Recurrent and worsening headaches.  Confusion. EXAM: MRI HEAD WITHOUT AND WITH CONTRAST TECHNIQUE: Multiplanar, multiecho pulse sequences of the brain and surrounding structures were obtained without and with intravenous contrast. CONTRAST:  51mL MULTIHANCE GADOBENATE DIMEGLUMINE 529 MG/ML IV SOLN COMPARISON:  MRI for 01/13/2007.  Also MRI 01/26/2005. FINDINGS: No evidence for acute infarction, hemorrhage, intra-axial mass lesion, hydrocephalus, or extra-axial fluid. Normal for age cerebral volume. No white matter disease. Flow voids are maintained. The study was not performed as a pituitary exam, and therefore dynamic imaging was not employed. However, thin-section imaging was obtained through the sella. There is evidence  for a 8 x 10 x 10 mm macro adenoma, RIGHT paramedian gland, convex upward margin, without chiasmatic compression. The tumor appears to extend into the RIGHT cavernous sinus without compromise of adjacent internal carotid artery. Interval growth since 2008. Post infusion imaging through the entire brain demonstrates no other areas of concern with regard to at abnormal enhancement. Major dural venous sinuses are patent. Cerebellar tonsils unremarkable. No extracranial soft tissue abnormality. Compared with 2006 and 2008 scans, the microadenoma measured 5-6 mm. Therefore interval growth has occurred. IMPRESSION: Interval growth of an 8 x 10 x 10 mm RIGHT paramedian pituitary macroadenoma. Slight suprasellar extension and RIGHT cavernous sinus extension but no chiasmatic compression. Endocrine and/or neurosurgical consultation is warranted. Otherwise negative MRI of the brain.  No cause seen for confusion. Electronically Signed   By: Staci Righter M.D.   On: 06/19/2016 15:06     Assessment & Plan:  Plan  I have discontinued Michael Pearson  multivitamin, ibuprofen, and ranitidine. I have also changed his losartan. Additionally, I am having him maintain his aspirin, cetirizine, omeprazole, mometasone, albuterol, atorvastatin, ezetimibe, and metoprolol tartrate.  Meds ordered this encounter  Medications  . albuterol (PROVENTIL HFA;VENTOLIN HFA) 108 (90 Base) MCG/ACT inhaler    Sig: Inhale 2 puffs into the lungs every 4 (four) hours as needed for wheezing or shortness of breath.    Dispense:  1 Inhaler    Refill:  2  . atorvastatin (LIPITOR) 40 MG tablet    Sig: Take 1 tablet (40 mg total) by mouth daily.    Dispense:  90 tablet    Refill:  3  . ezetimibe (ZETIA) 10 MG tablet    Sig: Take 1 tablet (10 mg total) by mouth daily.    Dispense:  30 tablet    Refill:  2  . losartan (COZAAR) 50 MG tablet    Sig: Take 1 tablet (50 mg total) by mouth daily.    Dispense:  30 tablet    Refill:  5  . metoprolol tartrate (LOPRESSOR) 25 MG tablet    Sig: Take 0.5 tablets (12.5 mg total) by mouth 2 (two) times daily.    Dispense:  30 tablet    Refill:  5    Problem List Items Addressed This Visit      Unprioritized   Non-ST elevation myocardial infarction (NSTEMI)   Relevant Medications   atorvastatin (LIPITOR) 40 MG tablet   ezetimibe (ZETIA) 10 MG tablet   losartan (COZAAR) 50 MG tablet   metoprolol tartrate (LOPRESSOR) 25 MG tablet   Other Relevant Orders   EKG 12-Lead (Completed)   Ambulatory referral to Social Work   Mild intermittent asthma   Relevant Medications   albuterol (PROVENTIL HFA;VENTOLIN HFA) 108 (90 Base) MCG/ACT inhaler   Other Relevant Orders   Ambulatory referral to Social Work   HTN (hypertension)   Relevant Medications   atorvastatin (LIPITOR) 40 MG tablet   ezetimibe (ZETIA) 10 MG tablet   losartan (COZAAR) 50 MG tablet   metoprolol tartrate (LOPRESSOR) 25 MG tablet   Other Relevant Orders   Comprehensive metabolic panel (Completed)   CBC (Completed)   EKG 12-Lead (Completed)   Ambulatory  referral to Social Work   GERD (gastroesophageal reflux disease)   Relevant Orders   Ambulatory referral to Social Work   Hyperlipidemia   Relevant Medications   atorvastatin (LIPITOR) 40 MG tablet   ezetimibe (ZETIA) 10 MG tablet   losartan (COZAAR) 50 MG tablet   metoprolol tartrate (LOPRESSOR) 25  MG tablet   Other Relevant Orders   Lipid panel (Completed)   Ambulatory referral to Social Work    Other Visit Diagnoses    Preventative health care    -  Primary   Influenza vaccine needed       Relevant Orders   Flu Vaccine QUAD 36+ mos IM (Fluarix & Fluzone Quad PF (Completed)   Urinary frequency       Relevant Orders   PSA (Completed)   Inadequate community resources       Relevant Orders   Ambulatory referral to Social Work   Coronary artery disease involving native coronary artery of native heart without angina pectoris       Relevant Medications   atorvastatin (LIPITOR) 40 MG tablet   ezetimibe (ZETIA) 10 MG tablet   losartan (COZAAR) 50 MG tablet   metoprolol tartrate (LOPRESSOR) 25 MG tablet      Follow-up: Return in about 6 months (around 05/23/2017).  [name], DO

## 2016-11-23 NOTE — Patient Instructions (Signed)
Preventive Care 40-64 Years, Male Preventive care refers to lifestyle choices and visits with your health care provider that can promote health and wellness. What does preventive care include?  A yearly physical exam. This is also called an annual well check.  Dental exams once or twice a year.  Routine eye exams. Ask your health care provider how often you should have your eyes checked.  Personal lifestyle choices, including:  Daily care of your teeth and gums.  Regular physical activity.  Eating a healthy diet.  Avoiding tobacco and drug use.  Limiting alcohol use.  Practicing safe sex.  Taking low-dose aspirin every day starting at age 50. What happens during an annual well check? The services and screenings done by your health care provider during your annual well check will depend on your age, overall health, lifestyle risk factors, and family history of disease. Counseling  Your health care provider may ask you questions about your:  Alcohol use.  Tobacco use.  Drug use.  Emotional well-being.  Home and relationship well-being.  Sexual activity.  Eating habits.  Work and work environment. Screening  You may have the following tests or measurements:  Height, weight, and BMI.  Blood pressure.  Lipid and cholesterol levels. These may be checked every 5 years, or more frequently if you are over 50 years old.  Skin check.  Lung cancer screening. You may have this screening every year starting at age 55 if you have a 30-pack-year history of smoking and currently smoke or have quit within the past 15 years.  Fecal occult blood test (FOBT) of the stool. You may have this test every year starting at age 50.  Flexible sigmoidoscopy or colonoscopy. You may have a sigmoidoscopy every 5 years or a colonoscopy every 10 years starting at age 50.  Prostate cancer screening. Recommendations will vary depending on your family history and other risks.  Hepatitis C  blood test.  Hepatitis B blood test.  Sexually transmitted disease (STD) testing.  Diabetes screening. This is done by checking your blood sugar (glucose) after you have not eaten for a while (fasting). You may have this done every 1-3 years. Discuss your test results, treatment options, and if necessary, the need for more tests with your health care provider. Vaccines  Your health care provider may recommend certain vaccines, such as:  Influenza vaccine. This is recommended every year.  Tetanus, diphtheria, and acellular pertussis (Tdap, Td) vaccine. You may need a Td booster every 10 years.  Varicella vaccine. You may need this if you have not been vaccinated.  Zoster vaccine. You may need this after age 60.  Measles, mumps, and rubella (MMR) vaccine. You may need at least one dose of MMR if you were born in 1957 or later. You may also need a second dose.  Pneumococcal 13-valent conjugate (PCV13) vaccine. You may need this if you have certain conditions and have not been vaccinated.  Pneumococcal polysaccharide (PPSV23) vaccine. You may need one or two doses if you smoke cigarettes or if you have certain conditions.  Meningococcal vaccine. You may need this if you have certain conditions.  Hepatitis A vaccine. You may need this if you have certain conditions or if you travel or work in places where you may be exposed to hepatitis A.  Hepatitis B vaccine. You may need this if you have certain conditions or if you travel or work in places where you may be exposed to hepatitis B.  Haemophilus influenzae type b (Hib)   vaccine. You may need this if you have certain risk factors. Talk to your health care provider about which screenings and vaccines you need and how often you need them. This information is not intended to replace advice given to you by your health care provider. Make sure you discuss any questions you have with your health care provider. Document Released: 11/14/2015  Document Revised: 07/07/2016 Document Reviewed: 08/19/2015 Elsevier Interactive Patient Education  2017 [employer]vier Inc.      Influenza (Flu) Vaccine (Inactivated or Recombinant): What You Need to Know 1. Why get vaccinated? Influenza ("flu") is a contagious disease that spreads around the [place] every year, usually between October and May. Flu is caused by influenza viruses, and is spread mainly by coughing, sneezing, and close contact. Anyone can get flu. Flu strikes suddenly and can last several days. Symptoms vary by age, but can include:  fever/chills  sore throat  muscle aches  fatigue  cough  headache  runny or stuffy nose Flu can also lead to pneumonia and blood infections, and cause diarrhea and seizures in children. If you have a medical condition, such as heart or lung disease, flu can make it worse. Flu is more dangerous for some people. Infants and young children, people 65 years of age and older, pregnant women, and people with certain health conditions or a weakened immune system are at greatest risk. Each year thousands of people in the [place] die from flu, and many more are hospitalized. Flu vaccine can:  keep you from getting flu,  make flu less severe if you do get it, and  keep you from spreading flu to your family and other people. 2. Inactivated and recombinant flu vaccines A dose of flu vaccine is recommended every flu season. Children 6 months through 8 years of age may need two doses during the same flu season. Everyone else needs only one dose each flu season. Some inactivated flu vaccines contain a very small amount of a mercury-based preservative called thimerosal. Studies have not shown thimerosal in vaccines to be harmful, but flu vaccines that do not contain thimerosal are available. There is no live flu virus in flu shots. They cannot cause the flu. There are many flu viruses, and they are always changing. Each year a new flu vaccine  is made to protect against three or four viruses that are likely to cause disease in the upcoming flu season. But even when the vaccine doesn't exactly match these viruses, it may still provide some protection. Flu vaccine cannot prevent:  flu that is caused by a virus not covered by the vaccine, or  illnesses that look like flu but are not. It takes about 2 weeks for protection to develop after vaccination, and protection lasts through the flu season. 3. Some people should not get this vaccine Tell the person who is giving you the vaccine:  If you have any severe, life-threatening allergies. If you ever had a life-threatening allergic reaction after a dose of flu vaccine, or have a severe allergy to any part of this vaccine, you may be advised not to get vaccinated. Most, but not all, types of flu vaccine contain a small amount of egg protein.  If you ever had Guillain-Barr Syndrome (also called GBS). Some people with a history of GBS should not get this vaccine. This should be discussed with your doctor.  If you are not feeling well. It is usually okay to get flu vaccine when you have a mild illness, but   you might be asked to come back when you feel better. 4. Risks of a vaccine reaction With any medicine, including vaccines, there is a chance of reactions. These are usually mild and go away on their own, but serious reactions are also possible. Most people who get a flu shot do not have any problems with it. Minor problems following a flu shot include:  soreness, redness, or swelling where the shot was given  hoarseness  sore, red or itchy eyes  cough  fever  aches  headache  itching  fatigue If these problems occur, they usually begin soon after the shot and last 1 or 2 days. More serious problems following a flu shot can include the following:  There may be a small increased risk of Guillain-Barre Syndrome (GBS) after inactivated flu vaccine. This risk has been estimated  at 1 or 2 additional cases per million people vaccinated. This is much lower than the risk of severe complications from flu, which can be prevented by flu vaccine.  Young children who get the flu shot along with pneumococcal vaccine (PCV13) and/or DTaP vaccine at the same time might be slightly more likely to have a seizure caused by fever. Ask your doctor for more information. Tell your doctor if a child who is getting flu vaccine has ever had a seizure. Problems that could happen after any injected vaccine:  People sometimes faint after a medical procedure, including vaccination. Sitting or lying down for about 15 minutes can help prevent fainting, and injuries caused by a fall. Tell your doctor if you feel dizzy, or have vision changes or ringing in the ears.  Some people get severe pain in the shoulder and have difficulty moving the arm where a shot was given. This happens very rarely.  Any medication can cause a severe allergic reaction. Such reactions from a vaccine are very rare, estimated at about 1 in a million doses, and would happen within a few minutes to a few hours after the vaccination. As with any medicine, there is a very remote chance of a vaccine causing a serious injury or death. The safety of vaccines is always being monitored. For more information, visit: www.cdc.gov/vaccinesafety/ 5. What if there is a serious reaction? What should I look for? Look for anything that concerns you, such as signs of a severe allergic reaction, very high fever, or unusual behavior. Signs of a severe allergic reaction can include hives, swelling of the face and throat, difficulty breathing, a fast heartbeat, dizziness, and weakness. These would start a few minutes to a few hours after the vaccination. What should I do?  If you think it is a severe allergic reaction or other emergency that can't wait, call 9-1-1 and get the person to the nearest hospital. Otherwise, call your doctor.  Reactions  should be reported to the Vaccine Adverse Event Reporting System (VAERS). Your doctor should file this report, or you can do it yourself through the VAERS web site at www.vaers.hhs.gov, or by calling 1-800-822-7967.  VAERS does not give medical advice. 6. The National Vaccine Injury Compensation Program The National Vaccine Injury Compensation Program (VICP) is a federal program that was created to compensate people who may have been injured by certain vaccines. Persons who believe they may have been injured by a vaccine can learn about the program and about filing a claim by calling 1-800-338-2382 or visiting the VICP website at www.hrsa.gov/vaccinecompensation. There is a time limit to file a claim for compensation. 7. How can I   learn more?  Ask your healthcare provider. He or she can give you the vaccine package insert or suggest other sources of information.  Call your local or state health department.  Contact the Centers for Disease Control and Prevention (CDC):  Call 1-800-232-4636 (1-800-CDC-INFO) or  Visit CDC's website at www.cdc.gov/flu Vaccine Information Statement, Inactivated Influenza Vaccine (06/07/2014) This information is not intended to replace advice given to you by your health care provider. Make sure you discuss any questions you have with your health care provider. Document Released: 08/12/2006 Document Revised: 07/08/2016 Document Reviewed: 07/08/2016 Elsevier Interactive Patient Education  2017 [employer]vier Inc.   

## 2016-11-23 NOTE — Progress Notes (Signed)
Pre visit review using our clinic review tool, if applicable. No additional management support is needed unless otherwise documented below in the visit note. 

## 2016-11-26 ENCOUNTER — Telehealth: Payer: Self-pay | Admitting: Family Medicine

## 2016-11-26 NOTE — Telephone Encounter (Signed)
Lab results have not been reviewed by PCP. Will call pt when results are available.

## 2016-11-26 NOTE — Telephone Encounter (Signed)
Relation to [id number] Call back [phone]  Reason for call:  Patient returning call regarding lab result, please advise

## 2016-11-29 ENCOUNTER — Encounter: Payer: Self-pay | Admitting: *Deleted

## 2016-11-29 ENCOUNTER — Other Ambulatory Visit: Payer: Self-pay | Admitting: *Deleted

## 2016-11-29 NOTE — Patient Outreach (Signed)
[facility]) Care Management  11/29/2016  UZAY LOUDERBACK Mar 11, 1961 [id number]  CSW received a request from patient's Primary Care Physician, Dr. Garnet Koyanagi to assist patient with completion of an Adult Medicaid Application through the [place]. CSW made an initial attempt to try and contact patient today to perform phone assessment, as well as assess and assist with social work needs and services, without success.  A HIPAA compliant message was left for patient on voicemail.  CSW is currently awaiting a return call.  If CSW does not receive a return call from patient within the next week, CSW will make a second outreach attempt. Nat Christen, BSW, MSW, LCSW  Licensed Education officer, environmental Health System  Mailing [address], [place], [place] [address] Physical Address-[address], [place], [place] [address] Toll Free Main # [phone] Fax # [phone] Cell # [phone]  Office # [phone] [email or web address]

## 2016-11-30 ENCOUNTER — Encounter: Payer: Self-pay | Admitting: *Deleted

## 2016-11-30 ENCOUNTER — Other Ambulatory Visit: Payer: Self-pay | Admitting: *Deleted

## 2016-11-30 NOTE — Progress Notes (Signed)
Thank you so much!!   Dr Carlean Jews

## 2016-11-30 NOTE — Patient Outreach (Signed)
[facility]) Care Management  11/30/2016  Michael Pearson 1961/07/20 [id number]  CSW received a return call from patient today, in response to the HIPAA compliant message that CSW left for patient on Monday, January 29th.  CSW was able to perform the initial phone assessment on patient, as well as assess and assist with social work needs and services.  CSW introduced self, explained role and types of services provided through [place] [facility] ([place] [facility]).  CSW further explained to patient that CSW works with patient's Primary Care Physician, Dr. Garnet Koyanagi and that Dr. Etter Sjogren thought that patient would benefit from social work services and resources to assist with completion of an Adult Medicaid application, as patient is currently uninsured.  CSW obtained two HIPAA compliant identifiers from patient, which included patient's name and date of birth. CSW and patient were able to schedule an initial office visit for Tuesday, February 6th, at which time, CSW will assist patient with completion of an Adult Medicaid application and ensure that the application gets submitted to the [place] for processing. Nat Christen, BSW, MSW, LCSW  Licensed Education officer, environmental Health System  Mailing [address] [place], [place] [address] Physical Address-[address], [place], [place] [address] Toll Free Main # [phone] Fax # [phone] Cell # [phone]  Office # [phone] [email or web address]

## 2016-12-06 ENCOUNTER — Ambulatory Visit: Payer: Self-pay | Admitting: *Deleted

## 2016-12-07 ENCOUNTER — Other Ambulatory Visit: Payer: Self-pay | Admitting: *Deleted

## 2016-12-07 NOTE — Patient Outreach (Signed)
[place] [facility]) Care Management  12/07/2016  ALGER KERSTEIN June 21, 1961 [id number]  CSW was able to meet with patient today to perform the initial office visit, as well as assist with completion of applications for financial assistance.  CSW spent roughly two hours with patient assisting with completion of the following applications:  Aged, Blind and Disabled (ABD) [place] -Pending [phone]     Insurance Affordability - Pending [id number]     [place] - Pending [phone] CSW further requested that patient's mother, Heidi Lemay and brother, Joanthony Hamza, be assessed for government assistance, as no one in the family currently has health insurance.  Not to mention, patient, Mr. Toro and Ms. Groeneveld are all currently living off of Ms. [place] check, which is less than a thousand dollars per month.  Patient admits that he has not paid any medical expenses since 2013 and Mr. Gathright since 2014.  Mr. Salzwedel admits to trying to find employment, but that no one will hire him.  Patient and Mr. Bottger were both provided reference numbers for their applications so that they are able to track processing and determine eligibility.  Patient, Mr. Saldivar and Ms. Burggraf all already receive [employer] and are set up on the [place] with [employer].  Last, Ms. Fury still has a Arts development officer on the home; however, she is only responsible for paying the monthly interest, as opposed to interest, principle and insurance. CSW will perform a case closure on patient, as all goals of treatment have been met from social work standpoint and no additional social work needs have been identified at this time.  CSW will fax an update to patient's Primary Care Physician, Dr. Garnet Koyanagi  to ensure that they are aware of CSW's involvement with patient's plan of care.  CSW will submit a case closure request to Josepha Pigg, Care Management Assistant with [place]  [facility], in the form of an In [employer].   Nat Christen, BSW, MSW, LCSW  Licensed Education officer, environmental Health System  Mailing [place] [address], [place], [place] [address] Physical Address-[address], [place], [place] [address] Toll Free Main # [phone] Fax # [phone] Cell # [phone]  Office # [phone] [email or web address]

## 2017-05-23 ENCOUNTER — Ambulatory Visit (INDEPENDENT_AMBULATORY_CARE_PROVIDER_SITE_OTHER): Payer: Self-pay | Admitting: Family Medicine

## 2017-05-23 ENCOUNTER — Encounter: Payer: Self-pay | Admitting: Family Medicine

## 2017-05-23 VITALS — BP 128/80 | HR 63 | Temp 97.6°F | Resp 16 | Ht 74.0 in | Wt 241.4 lb

## 2017-05-23 DIAGNOSIS — E785 Hyperlipidemia, unspecified: Secondary | ICD-10-CM

## 2017-05-23 DIAGNOSIS — I10 Essential (primary) hypertension: Secondary | ICD-10-CM

## 2017-05-23 DIAGNOSIS — D352 Benign neoplasm of pituitary gland: Secondary | ICD-10-CM

## 2017-05-23 DIAGNOSIS — I214 Non-ST elevation (NSTEMI) myocardial infarction: Secondary | ICD-10-CM

## 2017-05-23 LAB — LIPID PANEL
Cholesterol: 138 mg/dL (ref 0–200)
HDL: 53.6 mg/dL (ref >39.00)
LDL CALC: 63 mg/dL (ref 0–99)
NONHDL: 84.35
Total CHOL/HDL Ratio: 3
Triglycerides: 105 mg/dL (ref 0.0–149.0)
VLDL: 21 mg/dL (ref 0.0–40.0)

## 2017-05-23 LAB — COMPREHENSIVE METABOLIC PANEL
ALK PHOS: 46 U/L (ref 39–117)
ALT: 27 U/L (ref 0–53)
AST: 18 U/L (ref 0–37)
Albumin: 4.4 g/dL (ref 3.5–5.2)
BUN: 11 mg/dL (ref 6–23)
CHLORIDE: 104 meq/L (ref 96–112)
CO2: 26 mEq/L (ref 19–32)
Calcium: 10.1 mg/dL (ref 8.4–10.5)
Creatinine, Ser: 0.74 mg/dL (ref 0.40–1.50)
GFR: 116.12 mL/min (ref >60.00)
Glucose, Bld: 108 mg/dL — ABNORMAL HIGH (ref 70–99)
POTASSIUM: 3.8 meq/L (ref 3.5–5.1)
Sodium: 137 mEq/L (ref 135–145)
TOTAL PROTEIN: 6.9 g/dL (ref 6.0–8.3)
Total Bilirubin: 0.5 mg/dL (ref 0.2–1.2)

## 2017-05-23 NOTE — Progress Notes (Signed)
Patient ID: Michael Pearson, male   DOB: [birth year], 56 y.o.   MRN: [id number]    Subjective:  I acted as a Education administrator for Dr. Carollee Herter.  Guerry Bruin, [place]   Patient ID: Michael Pearson, male    DOB: 1961-03-10, 56 y.o.   MRN: [id number]  Chief Complaint  Patient presents with  . Hypertension  . Hyperlipidemia    HPI  Patient is in today for follow up blood pressure and cholesterol.  He has been doing well on current treatment.  He has been taking his medications as he should.  His brother is helping him get his medication.  They are going thr a law firm to see if he can get some help with his medical bills and medication.  He is trying to get disability  Patient Care Team: Carollee Herter, Alferd Apa, DO as PCP - General (Family Medicine) Sherren Mocha, MD as Consulting Physician (Cardiology) Hillary Bow, MD as Consulting Physician (Cardiology)   Past Medical History:  Diagnosis Date  . Allergy   . Arthritis   . Asthma   . CAD (coronary artery disease)    NSTEMI 7/12:  Cardiac cath on 7/17: pLAD occluded, Dx 30-40%, pRCA 30-40%, mRCA 30-40%.  Proximal LAD was treated with a BMS.  Echo 7/19:  EF 60-65%, mild LVH.    ETT-Myoview 6/14:  Inf thinning, no ischemia, EF 64%, normal study  . Colitis   . Diverticulosis   . Duodenitis    May 2012 with heme positive stools at this time. Endorses only rare streaking of blood now.  Marland Kitchen GERD (gastroesophageal reflux disease)   . Heart murmur    as a child per the pt  . Hiatal hernia   . Hx of hemorrhoids   . Hyperlipidemia   . Hypertension   . Kidney stones   . Microadenoma ([place])    MRI in 2008 demonstrating 4.6 mm area of pituitary gland   . Migraines    Has been evaluated multiple times in past for chronic headache in which pt has had occasional nose bleeds and bloodshot eyes. This lasted for 4-5 months. CT of the head was negative in May 2012.  . Myocardial infarction ([place]) 05/17/2011  . RBBB (right bundle branch block)    Noted on EKG in 2008    . Ulcer     Past Surgical History:  Procedure Laterality Date  . egd  03/05/2011   Dr. Benson Norway  . FLEXIBLE SIGMOIDOSCOPY  03/05/2011   Dr. Benson Norway  . HAMMER TOE SURGERY    . Heart stent    . HERNIA REPAIR     In 20's  . TOOTH EXTRACTION      Family History  Problem Relation Age of Onset  . Stroke Mother   . Heart attack Father 54       MI  . Skin cancer Father   . Cancer Father        ? type "rare"  . Obesity Brother   . Coronary artery disease Brother        Possible, pt not sure of specifics  . Hypertension Brother   . Colon polyps Maternal Aunt   . Colon cancer Maternal Aunt   . Colon polyps Paternal Grandmother     Social History   Social History  . Marital status: Single    Spouse name: N/A  . Number of children: N/A  . Years of education: N/A   Occupational History  . Diesal equipment-- cleans 3  buildings     Fairly physical job  .  [employer]   Social History Main Topics  . Smoking status: Never Smoker  . Smokeless tobacco: Never Used  . Alcohol use No  . Drug use: No  . Sexual activity: Not Currently   Other Topics Concern  . Not on file   Social History Narrative   Lives with brother and mother   Mother is quite sick, he and his brother take turns taking care of her   No children    Outpatient Medications Prior to Visit  Medication Sig Dispense Refill  . albuterol (PROVENTIL HFA;VENTOLIN HFA) 108 (90 Base) MCG/ACT inhaler Inhale 2 puffs into the lungs every 4 (four) hours as needed for wheezing or shortness of breath. 1 Inhaler 2  . aspirin 81 MG tablet Take 81 mg by mouth daily.      Marland Kitchen atorvastatin (LIPITOR) 40 MG tablet Take 1 tablet (40 mg total) by mouth daily. 90 tablet 3  . cetirizine (ZYRTEC) 10 MG tablet Take 10 mg by mouth daily.    Marland Kitchen ezetimibe (ZETIA) 10 MG tablet Take 1 tablet (10 mg total) by mouth daily. 30 tablet 2  . losartan (COZAAR) 50 MG tablet Take 1 tablet (50 mg total) by mouth daily. 30 tablet 5  . metoprolol tartrate  (LOPRESSOR) 25 MG tablet Take 0.5 tablets (12.5 mg total) by mouth 2 (two) times daily. 30 tablet 5  . mometasone (NASONEX) 50 MCG/ACT nasal spray Place 2 sprays into the nose daily. 17 g 2  . omeprazole (PRILOSEC) 40 MG capsule Take 1 capsule (40 mg total) by mouth daily. 30 minutes before breakfast 30 capsule 3   No facility-administered medications prior to visit.     No Known Allergies  Review of Systems  Constitutional: Negative for fever and malaise/fatigue.  HENT: Negative for congestion.   Eyes: Negative for blurred vision.  Respiratory: Negative for cough and shortness of breath.   Cardiovascular: Negative for chest pain, palpitations and leg swelling.  Gastrointestinal: Negative for vomiting.  Musculoskeletal: Negative for back pain.  Skin: Negative for rash.  Neurological: Negative for loss of consciousness and headaches.       Objective:    Physical Exam  Constitutional: He appears well-developed and well-nourished. No distress.  HENT:  Head: Normocephalic and atraumatic.  Eyes: Conjunctivae are normal.  Neck: Normal range of motion. No thyromegaly present.  Cardiovascular: Normal rate and regular rhythm.   Pulmonary/Chest: Effort normal. He has no wheezes.  Abdominal: Soft. Bowel sounds are normal. There is no tenderness.  Musculoskeletal: Normal range of motion. He exhibits no edema or deformity.  Neurological: He is alert.  Skin: Skin is warm and dry. He is not diaphoretic.  Psychiatric: He has a normal mood and affect.    BP 128/80 (BP Location: Left Arm, Cuff Size: Normal)   Pulse 63   Temp 97.6 F (36.4 C) (Oral)   Resp 16   Ht 6\' 2"  (1.88 m)   Wt 241 lb 6.4 oz (109.5 kg)   SpO2 98%   BMI 30.99 kg/m  Wt Readings from Last 3 Encounters:  05/23/17 241 lb 6.4 oz (109.5 kg)  11/23/16 230 lb 9.6 oz (104.6 kg)  06/09/16 246 lb (111.6 kg)   BP Readings from Last 3 Encounters:  05/23/17 128/80  11/23/16 122/88  05/18/16 112/86     Immunization  History  Administered Date(s) Administered  . Influenza Whole 09/01/2012  . Influenza,inj,Quad PF,36+ Mos 09/17/2014, 08/14/2015, 11/23/2016  .  Pneumococcal Conjugate-13 09/17/2014  . Tdap 05/15/2012, 12/28/2015    Health Maintenance  Topic Date Due  . HIV Screening  12/28/1975  . INFLUENZA VACCINE  06/01/2017  . COLONOSCOPY  04/06/2023  . TETANUS/TDAP  12/27/2025  . Hepatitis C Screening  Completed    Lab Results  Component Value Date   WBC 5.5 11/23/2016   HGB 14.2 11/23/2016   HCT 42.8 11/23/2016   PLT 189.0 11/23/2016   GLUCOSE 108 (H) 05/23/2017   CHOL 138 05/23/2017   TRIG 105.0 05/23/2017   HDL 53.60 05/23/2017   LDLCALC 63 05/23/2017   ALT 27 05/23/2017   AST 18 05/23/2017   NA 137 05/23/2017   K 3.8 05/23/2017   CL 104 05/23/2017   CREATININE 0.74 05/23/2017   BUN 11 05/23/2017   CO2 26 05/23/2017   TSH 0.98 11/03/2012   PSA 0.33 11/23/2016   INR 0.97 05/17/2011   HGBA1C 5.8 09/17/2014   MICROALBUR 1.5 11/20/2015    Lab Results  Component Value Date   TSH 0.98 11/03/2012   Lab Results  Component Value Date   WBC 5.5 11/23/2016   HGB 14.2 11/23/2016   HCT 42.8 11/23/2016   MCV 94.3 11/23/2016   PLT 189.0 11/23/2016   Lab Results  Component Value Date   NA 137 05/23/2017   K 3.8 05/23/2017   CO2 26 05/23/2017   GLUCOSE 108 (H) 05/23/2017   BUN 11 05/23/2017   CREATININE 0.74 05/23/2017   BILITOT 0.5 05/23/2017   ALKPHOS 46 05/23/2017   AST 18 05/23/2017   ALT 27 05/23/2017   PROT 6.9 05/23/2017   ALBUMIN 4.4 05/23/2017   CALCIUM 10.1 05/23/2017   GFR 116.12 05/23/2017   Lab Results  Component Value Date   CHOL 138 05/23/2017   Lab Results  Component Value Date   HDL 53.60 05/23/2017   Lab Results  Component Value Date   LDLCALC 63 05/23/2017   Lab Results  Component Value Date   TRIG 105.0 05/23/2017   Lab Results  Component Value Date   CHOLHDL 3 05/23/2017   Lab Results  Component Value Date   HGBA1C 5.8 09/17/2014           Assessment & Plan:   Problem List Items Addressed This Visit      Unprioritized   Essential hypertension   Relevant Orders   Comprehensive metabolic panel (Completed)   Lipid panel (Completed)   Hyperlipidemia LDL goal <100 - Primary    Tolerating statin, encouraged heart healthy diet, avoid trans fats, minimize simple carbs and saturated fats. Increase exercise as tolerated      Relevant Orders   Comprehensive metabolic panel (Completed)   Lipid panel (Completed)   Non-ST elevation myocardial infarction (NSTEMI)    Per cardiology      Pituitary microadenoma ([place])    Check prolactin level Reviewed ov note from neurosurgery Will forward result to neuro surgery so pt can have f/u      Relevant Orders   Prolactin (Completed)      I am having Mr. Hansell maintain his aspirin, cetirizine, omeprazole, mometasone, albuterol, atorvastatin, ezetimibe, losartan, and metoprolol tartrate.  No orders of the defined types were placed in this encounter.   CMA served as Education administrator during this visit. History, Physical and Plan performed by medical provider. Documentation and orders reviewed and attested to.  [name], DO

## 2017-05-23 NOTE — Assessment & Plan Note (Signed)
Per cardiology 

## 2017-05-23 NOTE — Assessment & Plan Note (Signed)
Check prolactin level Reviewed ov note from neurosurgery Will forward result to neuro surgery so pt can have f/u

## 2017-05-23 NOTE — Assessment & Plan Note (Signed)
Tolerating statin, encouraged heart healthy diet, avoid trans fats, minimize simple carbs and saturated fats. Increase exercise as tolerated 

## 2017-05-23 NOTE — Patient Instructions (Signed)

## 2017-05-24 ENCOUNTER — Telehealth: Payer: Self-pay | Admitting: *Deleted

## 2017-05-24 LAB — PROLACTIN: Prolactin: 297.7 ng/mL — ABNORMAL HIGH (ref 2.0–18.0)

## 2017-05-24 NOTE — Telephone Encounter (Signed)
Received request for Medical records from [facility], forwarded to [place] for email/scan/SLS 07/24

## 2017-05-26 ENCOUNTER — Other Ambulatory Visit: Payer: Self-pay | Admitting: Family Medicine

## 2017-05-26 DIAGNOSIS — D352 Benign neoplasm of pituitary gland: Secondary | ICD-10-CM

## 2017-05-26 DIAGNOSIS — E229 Hyperfunction of pituitary gland, unspecified: Principal | ICD-10-CM

## 2017-06-14 ENCOUNTER — Telehealth: Payer: Self-pay | Admitting: *Deleted

## 2017-06-14 NOTE — Telephone Encounter (Signed)
Received request for Medical records from [facility]; forwarded to [place] for email/scan/SLS 08/14

## 2017-07-26 ENCOUNTER — Ambulatory Visit (INDEPENDENT_AMBULATORY_CARE_PROVIDER_SITE_OTHER): Payer: Self-pay | Admitting: Endocrinology

## 2017-07-26 ENCOUNTER — Encounter: Payer: Self-pay | Admitting: Endocrinology

## 2017-07-26 VITALS — BP 118/72 | HR 59 | Wt 244.8 lb

## 2017-07-26 DIAGNOSIS — D352 Benign neoplasm of pituitary gland: Secondary | ICD-10-CM

## 2017-07-26 DIAGNOSIS — E221 Hyperprolactinemia: Secondary | ICD-10-CM

## 2017-07-26 MED ORDER — BROMOCRIPTINE MESYLATE 2.5 MG PO TABS: 2.5000 mg | ORAL_TABLET | Freq: Every day | ORAL | 11 refills | Status: DC

## 2017-07-26 NOTE — Progress Notes (Signed)
Subjective:    Patient ID: Michael Pearson, male    DOB: 1961-10-06, 56 y.o.   MRN: [id number]  HPI In 2005, pt was incidentally noted to have a nodule in the pituitary.  In 2017, Dr Etter Sjogren found moderate elevation of prolactin.  He is unaware of ever having been on pituitary treatment.  He has never had pituitary XRT or surgery.  A friend ([name] Early, helps, and is helping pt pursue disability).  He has nocturia 5-6 times per night, and assoc nausea.  He has intermitt vomiting.  He has no ins coverage for meds. Past Medical History:  Diagnosis Date  . Allergy   . Arthritis   . Asthma   . CAD (coronary artery disease)    NSTEMI 7/12:  Cardiac cath on 7/17: pLAD occluded, Dx 30-40%, pRCA 30-40%, mRCA 30-40%.  Proximal LAD was treated with a BMS.  Echo 7/19:  EF 60-65%, mild LVH.    ETT-Myoview 6/14:  Inf thinning, no ischemia, EF 64%, normal study  . Colitis   . Diverticulosis   . Duodenitis    May 2012 with heme positive stools at this time. Endorses only rare streaking of blood now.  Marland Kitchen GERD (gastroesophageal reflux disease)   . Heart murmur    as a child per the pt  . Hiatal hernia   . Hx of hemorrhoids   . Hyperlipidemia   . Hypertension   . Kidney stones   . Microadenoma (Michael Pearson)    MRI in 2008 demonstrating 4.6 mm area of pituitary gland   . Migraines    Has been evaluated multiple times in past for chronic headache in which pt has had occasional nose bleeds and bloodshot eyes. This lasted for 4-5 months. CT of the head was negative in May 2012.  . Myocardial infarction ([place]) 05/17/2011  . RBBB (right bundle branch block)    Noted on EKG in 2008  . Ulcer     Past Surgical History:  Procedure Laterality Date  . egd  03/05/2011   Dr. Benson Norway  . FLEXIBLE SIGMOIDOSCOPY  03/05/2011   Dr. Benson Norway  . HAMMER TOE SURGERY    . Heart stent    . HERNIA REPAIR     In 20's  . TOOTH EXTRACTION      Social History   Social History  . Marital status: Single    Spouse name: N/A  . Number of  children: N/A  . Years of education: N/A   Occupational History  . Diesal equipment-- cleans 3 buildings     Fairly physical job  .  [employer]   Social History Main Topics  . Smoking status: Never Smoker  . Smokeless tobacco: Never Used  . Alcohol use No  . Drug use: No  . Sexual activity: Not Currently   Other Topics Concern  . Not on file   Social History Narrative   Lives with brother and mother   Mother is quite sick, he and his brother take turns taking care of her   No children    Current Outpatient Prescriptions on File Prior to Visit  Medication Sig Dispense Refill  . albuterol (PROVENTIL HFA;VENTOLIN HFA) 108 (90 Base) MCG/ACT inhaler Inhale 2 puffs into the lungs every 4 (four) hours as needed for wheezing or shortness of breath. 1 Inhaler 2  . aspirin 81 MG tablet Take 81 mg by mouth daily.      Marland Kitchen atorvastatin (LIPITOR) 40 MG tablet Take 1 tablet (40 mg total)  by mouth daily. 90 tablet 3  . cetirizine (ZYRTEC) 10 MG tablet Take 10 mg by mouth daily.    Marland Kitchen ezetimibe (ZETIA) 10 MG tablet Take 1 tablet (10 mg total) by mouth daily. 30 tablet 2  . losartan (COZAAR) 50 MG tablet Take 1 tablet (50 mg total) by mouth daily. 30 tablet 5  . metoprolol tartrate (LOPRESSOR) 25 MG tablet Take 0.5 tablets (12.5 mg total) by mouth 2 (two) times daily. 30 tablet 5  . mometasone (NASONEX) 50 MCG/ACT nasal spray Place 2 sprays into the nose daily. 17 g 2  . omeprazole (PRILOSEC) 40 MG capsule Take 1 capsule (40 mg total) by mouth daily. 30 minutes before breakfast 30 capsule 3   No current facility-administered medications on file prior to visit.     No Known Allergies  Family History  Problem Relation Age of Onset  . Stroke Mother   . Heart attack Father 66       MI  . Skin cancer Father   . Cancer Father        ? type "rare"  . Obesity Brother   . Coronary artery disease Brother        Possible, pt not sure of specifics  . Hypertension Brother   . Colon polyps  Maternal Aunt   . Colon cancer Maternal Aunt   . Colon polyps Paternal Grandmother   . Other Neg Hx     BP 118/72   Pulse (!) 59   Wt 244 lb 12.8 oz (111 kg)   SpO2 96%   BMI 31.43 kg/m   Review of Systems denies loss of smell, rash, depression, gynecomastia, weight change, and change in facial appearance.  He has intermitt headache, easy bruising, and rhinorrhea.  He has visual loss only with headache.  He had a near-syncopal episode 2 weeks ago.     Objective:   Physical Exam VS: see vs page GEN: no distress HEAD: head: no deformity eyes: no periorbital swelling, no proptosis external nose and ears are normal mouth: no lesion seen NECK: supple, thyroid is not enlarged BREASTS: mod bilat pseudogynecomastia.   CHEST WALL: no deformity LUNGS: clear to auscultation CV: reg rate and rhythm, no murmur ABD: abdomen is soft, nontender.  no hepatosplenomegaly.  not distended.  no hernia MUSCULOSKELETAL: muscle bulk and strength are grossly normal.  no obvious joint swelling.  gait is normal and steady EXTEMITIES: no deformity.  no edema PULSES: no carotid bruit NEURO:  cn 2-12 grossly intact.   readily moves all 4's.  sensation is intact to touch on all 4's SKIN:  Normal texture and temperature.  No rash or suspicious lesion is visible.   NODES:  None palpable at the neck PSYCH: alert, well-oriented.  Does not appear anxious nor depressed.   Prolactin=298  Lab Results  Component Value Date   CREATININE 0.74 05/23/2017   BUN 11 05/23/2017   NA 137 05/23/2017   K 3.8 05/23/2017   CL 104 05/23/2017   CO2 26 05/23/2017   MRI: Interval growth of an 8 x 10 x 10 mm RIGHT paramedian pituitary macroadenoma. Slight suprasellar extension and RIGHT cavernous sinus extension but no chiasmatic compression.    Assessment & Plan:  Pituitary adenoma, with slow but steady growth Nocturia: uncertain if related to the above Hyperprolactinemia: rx of this will help shrink the adenoma.    Near-syncope: this may limit bromocriptine dosage. We'll follow  Patient Instructions  I have sent a prescription to your pharmacy,  for "bromocriptine."  Take just 1/2 pill the first week.  Please repeat the blood tests in 1 month.   Please come back for a follow-up appointment in 2 months.

## 2017-07-26 NOTE — Patient Instructions (Addendum)
I have sent a prescription to your pharmacy, for "bromocriptine."  Take just 1/2 pill the first week.  Please repeat the blood tests in 1 month.   Please come back for a follow-up appointment in 2 months.

## 2017-07-28 DIAGNOSIS — D352 Benign neoplasm of pituitary gland: Secondary | ICD-10-CM | POA: Insufficient documentation

## 2017-07-28 DIAGNOSIS — E221 Hyperprolactinemia: Secondary | ICD-10-CM | POA: Insufficient documentation

## 2017-08-18 ENCOUNTER — Other Ambulatory Visit: Payer: Self-pay | Admitting: Internal Medicine

## 2017-08-25 ENCOUNTER — Other Ambulatory Visit (INDEPENDENT_AMBULATORY_CARE_PROVIDER_SITE_OTHER): Payer: Self-pay

## 2017-08-25 DIAGNOSIS — D352 Benign neoplasm of pituitary gland: Secondary | ICD-10-CM

## 2017-08-25 DIAGNOSIS — E221 Hyperprolactinemia: Secondary | ICD-10-CM

## 2017-08-25 LAB — URINALYSIS, ROUTINE W REFLEX MICROSCOPIC
BILIRUBIN URINE: NEGATIVE
Hgb urine dipstick: NEGATIVE
KETONES UR: NEGATIVE
LEUKOCYTES UA: NEGATIVE
NITRITE: NEGATIVE
RBC / HPF: NONE SEEN (ref 0–?)
Specific Gravity, Urine: 1.02 (ref 1.000–1.030)
Total Protein, Urine: NEGATIVE
Urine Glucose: NEGATIVE
Urobilinogen, UA: 0.2 (ref 0.0–1.0)
pH: 6.5 (ref 5.0–8.0)

## 2017-08-25 LAB — T4, FREE: Free T4: 0.71 ng/dL (ref 0.60–1.60)

## 2017-08-25 LAB — TSH: TSH: 2.18 u[IU]/mL (ref 0.35–4.50)

## 2017-08-26 ENCOUNTER — Other Ambulatory Visit: Payer: Self-pay | Admitting: Endocrinology

## 2017-08-26 LAB — PROLACTIN: Prolactin: 31.3 ng/mL — ABNORMAL HIGH (ref 2.0–18.0)

## 2017-08-26 MED ORDER — BROMOCRIPTINE MESYLATE 2.5 MG PO TABS: 2.5000 mg | ORAL_TABLET | Freq: Two times a day (BID) | ORAL | 5 refills | Status: DC

## 2017-09-03 LAB — ARGININE VASOPRESSIN HORMONE: Arginine Vasopressin: 1.7 pg/mL

## 2017-11-02 ENCOUNTER — Other Ambulatory Visit: Payer: Self-pay | Admitting: *Deleted

## 2017-11-02 DIAGNOSIS — I10 Essential (primary) hypertension: Secondary | ICD-10-CM

## 2017-11-02 MED ORDER — LOSARTAN POTASSIUM 50 MG PO TABS: 50.0000 mg | ORAL_TABLET | Freq: Every day | ORAL | 5 refills | Status: DC

## 2017-11-17 ENCOUNTER — Telehealth: Payer: Self-pay | Admitting: Family Medicine

## 2017-11-17 NOTE — Telephone Encounter (Signed)
Copied from [place] [id number]. Topic: Appointment Scheduling - Scheduling Inquiry for Clinic >> Nov 17, 2017  4:25 PM Boyd Kerbs wrote: Reason for CRM:     Patient, Michael Pearson is coming in for physical on 1/24  and needs order for labs with an appt.    Michael Pearson - friend taking over helping with appt and money - (needs updated ROI at appt. )   Patient, Michael Pearson gave verbal to speak to Michael Pearson to speak to [place] until appt. and Michael Pearson is signed.    They are asking about appt. lab work and needs to know how much it would cost.?   Michael Pearson writes a check for him on payment.   When he has appt on 1/24, can Michael Pearson come in after Michael Pearson's  appt that day  and pay for the self pay 55% off?  It would be after work but before 5pm.  Michael Pearson gave verbal approval to leave a message on Michael's phone for date and time and amount.  [phone] /or  wk [phone].  Message can be left on either phones.

## 2017-11-22 NOTE — Telephone Encounter (Signed)
Would this be ok for [name] to do for patient.     Labs that we usually run around $146  Can you help out with cost of office visit call [name]?

## 2017-11-24 ENCOUNTER — Encounter: Payer: Self-pay | Admitting: Family Medicine

## 2017-11-24 ENCOUNTER — Ambulatory Visit (INDEPENDENT_AMBULATORY_CARE_PROVIDER_SITE_OTHER): Payer: Self-pay | Admitting: Family Medicine

## 2017-11-24 VITALS — BP 132/84 | HR 76 | Temp 97.9°F | Resp 16 | Ht 74.0 in | Wt 263.8 lb

## 2017-11-24 DIAGNOSIS — L84 Corns and callosities: Secondary | ICD-10-CM

## 2017-11-24 DIAGNOSIS — E785 Hyperlipidemia, unspecified: Secondary | ICD-10-CM

## 2017-11-24 DIAGNOSIS — D352 Benign neoplasm of pituitary gland: Secondary | ICD-10-CM

## 2017-11-24 DIAGNOSIS — J4520 Mild intermittent asthma, uncomplicated: Secondary | ICD-10-CM

## 2017-11-24 DIAGNOSIS — Z0000 Encounter for general adult medical examination without abnormal findings: Secondary | ICD-10-CM | POA: Insufficient documentation

## 2017-11-24 DIAGNOSIS — I10 Essential (primary) hypertension: Secondary | ICD-10-CM

## 2017-11-24 DIAGNOSIS — I2510 Atherosclerotic heart disease of native coronary artery without angina pectoris: Secondary | ICD-10-CM

## 2017-11-24 LAB — LIPID PANEL
Cholesterol: 138 mg/dL (ref 0–200)
HDL: 56.7 mg/dL (ref >39.00)
LDL Cholesterol: 65 mg/dL (ref 0–99)
NONHDL: 81.03
Total CHOL/HDL Ratio: 2
Triglycerides: 78 mg/dL (ref 0.0–149.0)
VLDL: 15.6 mg/dL (ref 0.0–40.0)

## 2017-11-24 LAB — CBC WITH DIFFERENTIAL/PLATELET
BASOS ABS: 0 10*3/uL (ref 0.0–0.1)
Basophils Relative: 0.7 % (ref 0.0–3.0)
EOS ABS: 0.1 10*3/uL (ref 0.0–0.7)
Eosinophils Relative: 2 % (ref 0.0–5.0)
HCT: 42.8 % (ref 39.0–52.0)
Hemoglobin: 14.2 g/dL (ref 13.0–17.0)
LYMPHS ABS: 1.1 10*3/uL (ref 0.7–4.0)
Lymphocytes Relative: 22.7 % (ref 12.0–46.0)
MCHC: 33.1 g/dL (ref 30.0–36.0)
MCV: 96 fl (ref 78.0–100.0)
Monocytes Absolute: 0.6 10*3/uL (ref 0.1–1.0)
Monocytes Relative: 12.2 % — ABNORMAL HIGH (ref 3.0–12.0)
Neutro Abs: 2.9 10*3/uL (ref 1.4–7.7)
Neutrophils Relative %: 62.4 % (ref 43.0–77.0)
PLATELETS: 197 10*3/uL (ref 150.0–400.0)
RBC: 4.46 Mil/uL (ref 4.22–5.81)
RDW: 14.1 % (ref 11.5–15.5)
WBC: 4.7 10*3/uL (ref 4.0–10.5)

## 2017-11-24 LAB — POC URINALSYSI DIPSTICK (AUTOMATED)
BILIRUBIN UA: NEGATIVE
Glucose, UA: NEGATIVE
KETONES UA: NEGATIVE
Leukocytes, UA: NEGATIVE
NITRITE UA: NEGATIVE
PH UA: 6 (ref 5.0–8.0)
PROTEIN UA: NEGATIVE
RBC UA: NEGATIVE
Spec Grav, UA: >=1.03 — AB (ref 1.010–1.025)
Urobilinogen, UA: 0.2 E.U./dL

## 2017-11-24 LAB — COMPREHENSIVE METABOLIC PANEL
ALK PHOS: 54 U/L (ref 39–117)
ALT: 31 U/L (ref 0–53)
AST: 23 U/L (ref 0–37)
Albumin: 4.3 g/dL (ref 3.5–5.2)
BILIRUBIN TOTAL: 0.6 mg/dL (ref 0.2–1.2)
BUN: 11 mg/dL (ref 6–23)
CO2: 29 mEq/L (ref 19–32)
CREATININE: 0.77 mg/dL (ref 0.40–1.50)
Calcium: 9.4 mg/dL (ref 8.4–10.5)
Chloride: 102 mEq/L (ref 96–112)
GFR: 110.71 mL/min (ref >60.00)
GLUCOSE: 101 mg/dL — AB (ref 70–99)
Potassium: 3.8 mEq/L (ref 3.5–5.1)
Sodium: 139 mEq/L (ref 135–145)
TOTAL PROTEIN: 7 g/dL (ref 6.0–8.3)

## 2017-11-24 MED ORDER — ALBUTEROL SULFATE HFA 108 (90 BASE) MCG/ACT IN AERS: 2.0000 | INHALATION_SPRAY | RESPIRATORY_TRACT | 2 refills | Status: DC | PRN

## 2017-11-24 MED ORDER — EZETIMIBE 10 MG PO TABS: 10.0000 mg | ORAL_TABLET | Freq: Every day | ORAL | 1 refills | Status: DC

## 2017-11-24 MED ORDER — METOPROLOL TARTRATE 25 MG PO TABS: 12.5000 mg | ORAL_TABLET | Freq: Two times a day (BID) | ORAL | 1 refills | Status: DC

## 2017-11-24 MED ORDER — ATORVASTATIN CALCIUM 40 MG PO TABS: 40.0000 mg | ORAL_TABLET | Freq: Every day | ORAL | 3 refills | Status: DC

## 2017-11-24 MED ORDER — LOSARTAN POTASSIUM 50 MG PO TABS: 50.0000 mg | ORAL_TABLET | Freq: Every day | ORAL | 1 refills | Status: DC

## 2017-11-24 NOTE — Patient Instructions (Signed)

## 2017-11-24 NOTE — Progress Notes (Signed)
Patient ID: Michael Pearson, male    DOB: Mar 24, 1961  Age: 57 y.o. MRN: [id number]    Subjective:  Subjective  HPI GANESH DEEG presents for cpe -- his disability came through but the medicare/ medicaid will take a year.  No complaints.     Review of Systems  Constitutional: Negative.   HENT: Negative for congestion, ear pain, hearing loss, nosebleeds, postnasal drip, rhinorrhea, sinus pressure, sneezing and tinnitus.   Eyes: Negative for photophobia, discharge, itching and visual disturbance.  Respiratory: Negative.   Cardiovascular: Negative.   Gastrointestinal: Negative for abdominal distention, abdominal pain, anal bleeding, blood in stool and constipation.  Endocrine: Negative.   Genitourinary: Negative.   Musculoskeletal: Negative.   Skin: Negative.   Allergic/Immunologic: Negative.   Neurological: Negative for dizziness, weakness, light-headedness, numbness and headaches.  Psychiatric/Behavioral: Positive for decreased concentration. Negative for agitation, confusion, dysphoric mood, sleep disturbance and suicidal ideas. The patient is not nervous/anxious.     History Past Medical History:  Diagnosis Date  . Allergy   . Arthritis   . Asthma   . CAD (coronary artery disease)    NSTEMI 7/12:  Cardiac cath on 7/17: pLAD occluded, Dx 30-40%, pRCA 30-40%, mRCA 30-40%.  Proximal LAD was treated with a BMS.  Echo 7/19:  EF 60-65%, mild LVH.    ETT-Myoview 6/14:  Inf thinning, no ischemia, EF 64%, normal study  . Colitis   . Diverticulosis   . Duodenitis    May 2012 with heme positive stools at this time. Endorses only rare streaking of blood now.  Marland Kitchen GERD (gastroesophageal reflux disease)   . Heart murmur    as a child per the pt  . Hiatal hernia   . Hx of hemorrhoids   . Hyperlipidemia   . Hypertension   . Kidney stones   . Microadenoma ([place])    MRI in 2008 demonstrating 4.6 mm area of pituitary gland   . Migraines    Has been evaluated multiple times in past for chronic  headache in which pt has had occasional nose bleeds and bloodshot eyes. This lasted for 4-5 months. CT of the head was negative in May 2012.  . Myocardial infarction ([place]) 05/17/2011  . RBBB (right bundle branch block)    Noted on EKG in 2008  . Ulcer     He has a past surgical history that includes Hernia repair; Tooth extraction; Hammer toe surgery; egd (03/05/2011); Flexible sigmoidoscopy (03/05/2011); and Heart stent.   His family history includes Cancer in his father; Colon cancer in his maternal aunt; Colon polyps in his maternal aunt and paternal grandmother; Coronary artery disease in his brother; Heart attack (age of onset: 8) in his father; Hypertension in his brother; Obesity in his brother; Skin cancer in his father; Stroke in his mother.He reports that  has never smoked. he has never used smokeless tobacco. He reports that he does not drink alcohol or use drugs.  Current Outpatient Medications on File Prior to Visit  Medication Sig Dispense Refill  . aspirin 81 MG tablet Take 81 mg by mouth daily.      . bromocriptine (PARLODEL) 2.5 MG tablet Take 1 tablet (2.5 mg total) by mouth 2 (two) times daily. 60 tablet 5  . cetirizine (ZYRTEC) 10 MG tablet Take 10 mg by mouth daily.    . mometasone (NASONEX) 50 MCG/ACT nasal spray Place 2 sprays into the nose daily. 17 g 2  . omeprazole (PRILOSEC) 40 MG capsule Take 1 capsule (40  mg total) by mouth daily. 30 minutes before breakfast 30 capsule 3   No current facility-administered medications on file prior to visit.      Objective:  Objective  Physical Exam  Constitutional: He is oriented to person, place, and time. Vital signs are normal. He appears well-developed and well-nourished. He is sleeping. No distress.  HENT:  Head: Normocephalic and atraumatic.  Right Ear: External ear normal.  Left Ear: External ear normal.  Nose: Nose normal.  Mouth/Throat: Oropharynx is clear and moist. No oropharyngeal exudate.  Eyes: Conjunctivae and  EOM are normal. Pupils are equal, round, and reactive to light. Right eye exhibits no discharge. Left eye exhibits no discharge.  Neck: Normal range of motion. Neck supple. No JVD present. No thyromegaly present.  Cardiovascular: Normal rate, regular rhythm and intact distal pulses. Exam reveals no gallop and no friction rub.  No murmur heard. Pulmonary/Chest: Effort normal and breath sounds normal. No respiratory distress. He has no wheezes. He has no rales. He exhibits no tenderness.  Abdominal: Soft. Bowel sounds are normal. He exhibits no distension and no mass. There is no tenderness. There is no rebound and no guarding.  Genitourinary: Rectum normal, prostate normal and penis normal. Rectal exam shows guaiac negative stool.  Musculoskeletal: Normal range of motion. He exhibits no edema or tenderness.  Lymphadenopathy:    He has no cervical adenopathy.  Neurological: He is alert and oriented to person, place, and time. He displays normal reflexes. He exhibits normal muscle tone.  Skin: Skin is warm and dry. No rash noted. He is not diaphoretic. No erythema. No pallor.  Psychiatric: He has a normal mood and affect. Judgment and thought content normal. He is slowed. Cognition and memory are normal.  Nursing note and vitals reviewed.  BP 132/84 (BP Location: Right Arm, Cuff Size: Normal)   Pulse 76   Temp 97.9 F (36.6 C) (Oral)   Resp 16   Ht 6\' 2"  (1.88 m)   Wt 263 lb 12.8 oz (119.7 kg)   SpO2 96%   BMI 33.87 kg/m  Wt Readings from Last 3 Encounters:  11/24/17 263 lb 12.8 oz (119.7 kg)  07/26/17 244 lb 12.8 oz (111 kg)  05/23/17 241 lb 6.4 oz (109.5 kg)     Lab Results  Component Value Date   WBC 5.5 11/23/2016   HGB 14.2 11/23/2016   HCT 42.8 11/23/2016   PLT 189.0 11/23/2016   GLUCOSE 108 (H) 05/23/2017   CHOL 138 05/23/2017   TRIG 105.0 05/23/2017   HDL 53.60 05/23/2017   LDLCALC 63 05/23/2017   ALT 27 05/23/2017   AST 18 05/23/2017   NA 137 05/23/2017   K 3.8  05/23/2017   CL 104 05/23/2017   CREATININE 0.74 05/23/2017   BUN 11 05/23/2017   CO2 26 05/23/2017   TSH 2.18 08/25/2017   PSA 0.33 11/23/2016   INR 0.97 05/17/2011   HGBA1C 5.8 09/17/2014   MICROALBUR 1.5 11/20/2015    Mr Brain W Wo Contrast  Result Date: 06/19/2016 CLINICAL DATA:  Recurrent and worsening headaches.  Confusion. EXAM: MRI HEAD WITHOUT AND WITH CONTRAST TECHNIQUE: Multiplanar, multiecho pulse sequences of the brain and surrounding structures were obtained without and with intravenous contrast. CONTRAST:  10mL MULTIHANCE GADOBENATE DIMEGLUMINE 529 MG/ML IV SOLN COMPARISON:  MRI for 01/13/2007.  Also MRI 01/26/2005. FINDINGS: No evidence for acute infarction, hemorrhage, intra-axial mass lesion, hydrocephalus, or extra-axial fluid. Normal for age cerebral volume. No white matter disease. Flow voids are maintained.  The study was not performed as a pituitary exam, and therefore dynamic imaging was not employed. However, thin-section imaging was obtained through the sella. There is evidence for a 8 x 10 x 10 mm macro adenoma, RIGHT paramedian gland, convex upward margin, without chiasmatic compression. The tumor appears to extend into the RIGHT cavernous sinus without compromise of adjacent internal carotid artery. Interval growth since 2008. Post infusion imaging through the entire brain demonstrates no other areas of concern with regard to at abnormal enhancement. Major dural venous sinuses are patent. Cerebellar tonsils unremarkable. No extracranial soft tissue abnormality. Compared with 2006 and 2008 scans, the microadenoma measured 5-6 mm. Therefore interval growth has occurred. IMPRESSION: Interval growth of an 8 x 10 x 10 mm RIGHT paramedian pituitary macroadenoma. Slight suprasellar extension and RIGHT cavernous sinus extension but no chiasmatic compression. Endocrine and/or neurosurgical consultation is warranted. Otherwise negative MRI of the brain.  No cause seen for confusion.  Electronically Signed   By: Staci Righter M.D.   On: 06/19/2016 15:06     Assessment & Plan:  Plan  I am having Jeannine Boga maintain his aspirin, cetirizine, omeprazole, mometasone, bromocriptine, metoprolol tartrate, losartan, ezetimibe, atorvastatin, and albuterol.  Meds ordered this encounter  Medications  . metoprolol tartrate (LOPRESSOR) 25 MG tablet    Sig: Take 0.5 tablets (12.5 mg total) by mouth 2 (two) times daily.    Dispense:  90 tablet    Refill:  1  . losartan (COZAAR) 50 MG tablet    Sig: Take 1 tablet (50 mg total) by mouth daily.    Dispense:  90 tablet    Refill:  1  . ezetimibe (ZETIA) 10 MG tablet    Sig: Take 1 tablet (10 mg total) by mouth daily.    Dispense:  90 tablet    Refill:  1  . atorvastatin (LIPITOR) 40 MG tablet    Sig: Take 1 tablet (40 mg total) by mouth daily.    Dispense:  90 tablet    Refill:  3  . albuterol (PROVENTIL HFA;VENTOLIN HFA) 108 (90 Base) MCG/ACT inhaler    Sig: Inhale 2 puffs into the lungs every 4 (four) hours as needed for wheezing or shortness of breath.    Dispense:  1 Inhaler    Refill:  2    Problem List Items Addressed This Visit      Unprioritized   Essential hypertension    Well controlled, no changes to meds. Encouraged heart healthy diet such as the DASH diet and exercise as tolerated.       Relevant Medications   metoprolol tartrate (LOPRESSOR) 25 MG tablet   losartan (COZAAR) 50 MG tablet   ezetimibe (ZETIA) 10 MG tablet   atorvastatin (LIPITOR) 40 MG tablet   Hyperlipidemia LDL goal <100    Tolerating statin, encouraged heart healthy diet, avoid trans fats, minimize simple carbs and saturated fats. Increase exercise as tolerated      Relevant Medications   metoprolol tartrate (LOPRESSOR) 25 MG tablet   losartan (COZAAR) 50 MG tablet   ezetimibe (ZETIA) 10 MG tablet   atorvastatin (LIPITOR) 40 MG tablet   Other Relevant Orders   Comprehensive metabolic panel   Lipid panel   Mild intermittent asthma     Relevant Medications   albuterol (PROVENTIL HFA;VENTOLIN HFA) 108 (90 Base) MCG/ACT inhaler   Pituitary adenoma ([place])    Per endo      Preventative health care - Primary    Ghm utd Check labs  See AVS       Other Visit Diagnoses    Coronary artery disease involving native coronary artery of native heart without angina pectoris       Relevant Medications   metoprolol tartrate (LOPRESSOR) 25 MG tablet   losartan (COZAAR) 50 MG tablet   ezetimibe (ZETIA) 10 MG tablet   atorvastatin (LIPITOR) 40 MG tablet   Other Relevant Orders   POCT Urinalysis Dipstick (Automated) (Completed)   CBC with Differential/Platelet   Comprehensive metabolic panel   Lipid panel   Hyperlipidemia, unspecified hyperlipidemia type       Relevant Medications   metoprolol tartrate (LOPRESSOR) 25 MG tablet   losartan (COZAAR) 50 MG tablet   ezetimibe (ZETIA) 10 MG tablet   atorvastatin (LIPITOR) 40 MG tablet   Pre-ulcerative corn or callous       Relevant Orders   Ambulatory referral to Podiatry      Follow-up: Return in about 6 months (around 05/24/2018), or if symptoms worsen or fail to improve, for hypertension, hyperlipidemia.  [name], DO

## 2017-11-24 NOTE — Assessment & Plan Note (Signed)
Per endo °

## 2017-11-24 NOTE — Assessment & Plan Note (Signed)
Ghm utd Check labs  See AVS  

## 2017-11-24 NOTE — Assessment & Plan Note (Signed)
Well controlled, no changes to meds. Encouraged heart healthy diet such as the DASH diet and exercise as tolerated.  °

## 2017-11-24 NOTE — Assessment & Plan Note (Signed)
Tolerating statin, encouraged heart healthy diet, avoid trans fats, minimize simple carbs and saturated fats. Increase exercise as tolerated 

## 2018-01-21 ENCOUNTER — Other Ambulatory Visit: Payer: Self-pay

## 2018-01-21 ENCOUNTER — Encounter (HOSPITAL_COMMUNITY): Payer: Self-pay | Admitting: Emergency Medicine

## 2018-01-21 ENCOUNTER — Emergency Department (HOSPITAL_COMMUNITY)
Admission: EM | Admit: 2018-01-21 | Discharge: 2018-01-21 | Disposition: A | Discharge status: Home / Self Care | Payer: Self-pay | Attending: Emergency Medicine | Admitting: Emergency Medicine

## 2018-01-21 ENCOUNTER — Emergency Department (HOSPITAL_COMMUNITY): Payer: Self-pay

## 2018-01-21 DIAGNOSIS — Z7982 Long term (current) use of aspirin: Secondary | ICD-10-CM | POA: Insufficient documentation

## 2018-01-21 DIAGNOSIS — I10 Essential (primary) hypertension: Secondary | ICD-10-CM | POA: Insufficient documentation

## 2018-01-21 DIAGNOSIS — R0982 Postnasal drip: Secondary | ICD-10-CM | POA: Insufficient documentation

## 2018-01-21 DIAGNOSIS — Z79899 Other long term (current) drug therapy: Secondary | ICD-10-CM | POA: Insufficient documentation

## 2018-01-21 DIAGNOSIS — I2510 Atherosclerotic heart disease of native coronary artery without angina pectoris: Secondary | ICD-10-CM | POA: Insufficient documentation

## 2018-01-21 DIAGNOSIS — J45909 Unspecified asthma, uncomplicated: Secondary | ICD-10-CM | POA: Insufficient documentation

## 2018-01-21 DIAGNOSIS — R05 Cough: Secondary | ICD-10-CM | POA: Insufficient documentation

## 2018-01-21 MED ORDER — PREDNISONE 20 MG PO TABS
60.0000 mg | ORAL_TABLET | Freq: Once | ORAL | Status: AC
Start: 2018-01-21 — End: 2018-01-21
  Administered 2018-01-21: 60 mg via ORAL
  Filled 2018-01-21: qty 3

## 2018-01-21 MED ORDER — ALBUTEROL SULFATE HFA 108 (90 BASE) MCG/ACT IN AERS
2.0000 | INHALATION_SPRAY | Freq: Once | RESPIRATORY_TRACT | Status: AC
  Administered 2018-01-21: 2 via RESPIRATORY_TRACT
  Filled 2018-01-21: qty 6.7

## 2018-01-21 MED ORDER — PREDNISONE 50 MG PO TABS: ORAL_TABLET | ORAL | 0 refills | Status: DC

## 2018-01-21 MED ORDER — BENZONATATE 100 MG PO CAPS: 100.0000 mg | ORAL_CAPSULE | Freq: Three times a day (TID) | ORAL | 0 refills | Status: DC

## 2018-01-21 NOTE — ED Notes (Signed)
Patient able to ambulate independently  

## 2018-01-21 NOTE — ED Provider Notes (Signed)
[facility] EMERGENCY DEPARTMENT Provider Note   CSN: [id number] Arrival date & time: 01/21/18  1131     History   Chief Complaint Chief Complaint  Patient presents with  . Cough  . Sore Throat    HPI   Blood pressure (!) 109/58, pulse 78, temperature 98.5 F (36.9 C), temperature source Oral, resp. rate 18, height 6\' 2"  (1.88 m), weight 104.3 kg (230 lb), SpO2 97 %.  Michael Pearson is a 57 y.o. male complaining of productive cough with rhinorrhea nasal congestion and sore throat worsening over the course of a month.  He denies any fevers, chills, chest pain, shortness of breath, nausea or vomiting.   Past Medical History:  Diagnosis Date  . Allergy   . Arthritis   . Asthma   . CAD (coronary artery disease)    NSTEMI 7/12:  Cardiac cath on 7/17: pLAD occluded, Dx 30-40%, pRCA 30-40%, mRCA 30-40%.  Proximal LAD was treated with a BMS.  Echo 7/19:  EF 60-65%, mild LVH.    ETT-Myoview 6/14:  Inf thinning, no ischemia, EF 64%, normal study  . Colitis   . Diverticulosis   . Duodenitis    May 2012 with heme positive stools at this time. Endorses only rare streaking of blood now.  Marland Kitchen GERD (gastroesophageal reflux disease)   . Heart murmur    as a child per the pt  . Hiatal hernia   . Hx of hemorrhoids   . Hyperlipidemia   . Hypertension   . Kidney stones   . Microadenoma ([place])    MRI in 2008 demonstrating 4.6 mm area of pituitary gland   . Migraines    Has been evaluated multiple times in past for chronic headache in which pt has had occasional nose bleeds and bloodshot eyes. This lasted for 4-5 months. CT of the head was negative in May 2012.  . Myocardial infarction ([place]) 05/17/2011  . RBBB (right bundle branch block)    Noted on EKG in 2008  . Ulcer     Patient Active Problem List   Diagnosis Date Noted  . Preventative health care 11/24/2017  . Hyperprolactinemia ([place]) 07/28/2017  . Pituitary adenoma ([place]) 07/28/2017  . Chest pain 05/19/2016  .  Laceration of right index finger w/o foreign body w/o damage to nail 01/14/2016  . Visit for suture removal 01/09/2016  . GERD (gastroesophageal reflux disease) 12/04/2015  . Headache 08/14/2015  . History of diverticulitis of colon 03/30/2013  . Acute diverticulitis 02/03/2013  . Essential hypertension 02/03/2013  . Chronic allergic rhinitis 11/03/2012  . Heme positive stool 11/03/2012  . Foot injury 05/15/2012  . Upper airway cough syndrome p batter acid exp vs asthma  07/22/2011  . Migraines 06/17/2011  . Mild intermittent asthma 06/17/2011  . Non-ST elevation myocardial infarction (NSTEMI) 06/07/2011  . Coronary atherosclerosis of native coronary artery 06/07/2011  . Hyperlipidemia LDL goal <100 06/07/2011    Past Surgical History:  Procedure Laterality Date  . egd  03/05/2011   Dr. Benson [place]  . FLEXIBLE SIGMOIDOSCOPY  03/05/2011   Dr. Benson Norway  . HAMMER TOE SURGERY    . Heart stent    . HERNIA REPAIR     In 20's  . TOOTH EXTRACTION          Home Medications    Prior to Admission medications   Medication Sig Start Date End Date Taking? Authorizing Provider  albuterol (PROVENTIL HFA;VENTOLIN HFA) 108 (90 Base) MCG/ACT inhaler Inhale 2 puffs into  the lungs every 4 (four) hours as needed for wheezing or shortness of breath. 11/24/17 08/12/22  Carollee Herter, Alferd Apa, DO  aspirin 81 MG tablet Take 81 mg by mouth daily.      [provider]  atorvastatin (LIPITOR) 40 MG tablet Take 1 tablet (40 mg total) by mouth daily. 11/24/17   [name], DO  benzonatate (TESSALON) 100 MG capsule Take 1 capsule (100 mg total) by mouth every 8 (eight) hours. 01/21/18   Jarrett Albor, Elmyra Ricks, PA-C  bromocriptine (PARLODEL) 2.5 MG tablet Take 1 tablet (2.5 mg total) by mouth 2 (two) times daily. 08/26/17   Renato Shin, MD  cetirizine (ZYRTEC) 10 MG tablet Take 10 mg by mouth daily.    [provider]  ezetimibe (ZETIA) 10 MG tablet Take 1 tablet (10 mg total) by mouth daily.  11/24/17   [name], DO  losartan (COZAAR) 50 MG tablet Take 1 tablet (50 mg total) by mouth daily. 11/24/17   [name], DO  metoprolol tartrate (LOPRESSOR) 25 MG tablet Take 0.5 tablets (12.5 mg total) by mouth 2 (two) times daily. 11/24/17   Carollee Herter, Yvonne R, DO  mometasone (NASONEX) 50 MCG/ACT nasal spray Place 2 sprays into the nose daily. 05/18/16 04/19/21  [name], DO  omeprazole (PRILOSEC) 40 MG capsule Take 1 capsule (40 mg total) by mouth daily. 30 minutes before breakfast 01/21/16   Pyrtle, Lajuan Lines, MD  predniSONE (DELTASONE) 50 MG tablet Take 1 tablet daily with breakfast 01/21/18   Annastasia Haskins, Elmyra Ricks, PA-C    Family History Family History  Problem Relation Age of Onset  . Stroke Mother   . Heart attack Father 25       MI  . Skin cancer Father   . Cancer Father        ? type "rare"  . Obesity Brother   . Coronary artery disease Brother        Possible, pt not sure of specifics  . Hypertension Brother   . Colon polyps Maternal Aunt   . Colon cancer Maternal Aunt   . Colon polyps Paternal Grandmother   . Other Neg Hx     Social History Social History   Tobacco Use  . Smoking status: Never Smoker  . Smokeless tobacco: Never Used  Substance Use Topics  . Alcohol use: No  . Drug use: No     Allergies   Patient has no known allergies.   Review of Systems Review of Systems  A complete review of systems was obtained and all systems are negative except as noted in the HPI and PMH.   Physical Exam Updated Vital Signs BP (!) 109/58 (BP Location: Left Arm)   Pulse 78   Temp 98.5 F (36.9 C) (Oral)   Resp 18   Ht 6\' 2"  (1.88 m)   Wt 104.3 kg (230 lb)   SpO2 97%   BMI 29.53 kg/m   Physical Exam  Constitutional: He appears well-developed and well-nourished.  HENT:  Head: Normocephalic.  Right Ear: External ear normal.  Left Ear: External ear normal.  Mouth/Throat: Oropharynx is clear and moist. No oropharyngeal exudate.   No drooling or stridor. Posterior pharynx mildly erythematous no significant tonsillar hypertrophy. No exudate. Soft palate rises symmetrically. No TTP or induration under tongue.   No tenderness to palpation of frontal or bilateral maxillary sinuses.  Mild mucosal edema in the nares with scant rhinorrhea.  Bilateral tympanic membranes with normal  architecture and good light reflex.    Eyes: Pupils are equal, round, and reactive to light. Conjunctivae and EOM are normal.  Neck: Normal range of motion. Neck supple.  Cardiovascular: Normal rate and regular rhythm.  Pulmonary/Chest: Effort normal and breath sounds normal. No stridor. No respiratory distress. He has no wheezes. He has no rales. He exhibits no tenderness.  Abdominal: Soft. There is no tenderness. There is no rebound and no guarding.  Nursing note and vitals reviewed.    ED Treatments / Results  Labs (all labs ordered are listed, but only abnormal results are displayed) Labs Reviewed - No data to display  EKG None  Radiology Dg Chest 2 View  Result Date: 01/21/2018 CLINICAL DATA:  Cough since March, sore throat, history asthma, coronary artery disease, hypertension EXAM: CHEST - 2 VIEW COMPARISON:  05/17/2011 FINDINGS: Normal heart size, mediastinal contours, and pulmonary vascularity. Minimal bronchitic changes. No pulmonary infiltrate, pleural effusion or pneumothorax. Bones unremarkable. IMPRESSION: Minimal bronchitic changes without infiltrate. Electronically Signed   By: Lavonia Dana M.D.   On: 01/21/2018 12:28    Procedures Procedures (including critical care time)  Medications Ordered in ED Medications  albuterol (PROVENTIL HFA;VENTOLIN HFA) 108 (90 Base) MCG/ACT inhaler 2 puff (has no administration in time range)  predniSONE (DELTASONE) tablet 60 mg (has no administration in time range)     Initial Impression / Assessment and Plan / ED Course  I have reviewed the triage vital signs and the nursing  notes.  Pertinent labs & imaging results that were available during my care of the patient were reviewed by me and considered in my medical decision making (see chart for details).     Vitals:   01/21/18 1156 01/21/18 1157 01/21/18 1258  BP:  117/73 (!) 109/58  Pulse:  83 78  Resp:  18 18  Temp:  98.5 F (36.9 C)   TempSrc:  Oral   SpO2:  96% 97%  Weight: 104.3 kg (230 lb)    Height: 6\' 2"  (1.88 m)      Medications  albuterol (PROVENTIL HFA;VENTOLIN HFA) 108 (90 Base) MCG/ACT inhaler 2 puff (has no administration in time range)  predniSONE (DELTASONE) tablet 60 mg (has no administration in time range)    Michael Pearson is 57 y.o. male presenting with persistent cough over the course of the month, I think this is likely secondary to postnasal drip. Lung sounds clear to auscultation, chest x-ray negative. Advised patient to start taking Flonase, will give albuterol inhaler and short course of steroids.    Evaluation does not show pathology that would require ongoing emergent intervention or inpatient treatment. Pt is hemodynamically stable and mentating appropriately. Discussed findings and plan with patient/guardian, who agrees with care plan. All questions answered. Return precautions discussed and outpatient follow up given.      Final Clinical Impressions(s) / ED Diagnoses   Final diagnoses:  Post-nasal drip    ED Discharge Orders        Ordered    predniSONE (DELTASONE) 50 MG tablet     01/21/18 1405    benzonatate (TESSALON) 100 MG capsule  Every 8 hours     01/21/18 1406       Nirvi Boehler, Charna Elizabeth 01/21/18 1407    Quintella Reichert, MD 01/23/18 [phone]

## 2018-01-21 NOTE — Discharge Instructions (Addendum)
Please follow with your primary care doctor in the next 2 days for a check-up. They must obtain records for further management.  ° °Do not hesitate to return to the Emergency Department for any new, worsening or concerning symptoms.  ° °

## 2018-01-21 NOTE — ED Triage Notes (Signed)
Pt, stated, ive had a cough since Mar and Ive notice my throat is sore.

## 2018-01-26 ENCOUNTER — Encounter: Payer: Self-pay | Admitting: Podiatry

## 2018-01-26 ENCOUNTER — Ambulatory Visit (INDEPENDENT_AMBULATORY_CARE_PROVIDER_SITE_OTHER): Payer: Self-pay

## 2018-01-26 ENCOUNTER — Ambulatory Visit: Payer: Self-pay | Admitting: Podiatry

## 2018-01-26 DIAGNOSIS — M2042 Other hammer toe(s) (acquired), left foot: Secondary | ICD-10-CM

## 2018-01-26 DIAGNOSIS — Q828 Other specified congenital malformations of skin: Secondary | ICD-10-CM

## 2018-01-26 DIAGNOSIS — M216X9 Other acquired deformities of unspecified foot: Secondary | ICD-10-CM

## 2018-01-26 DIAGNOSIS — M2041 Other hammer toe(s) (acquired), right foot: Secondary | ICD-10-CM

## 2018-01-26 NOTE — Progress Notes (Signed)
Subjective:    Patient ID: Michael Pearson, male    DOB: September 12, 1961, 57 y.o.   MRN: [id number]  HPI Michael Pearson presents the office today with a friend for concerns of hammertoes for both of his feet as well as for calluses to his feet.  A history of hammertoe surgery several years ago when he was in his 70s.  He denies any recent injury or trauma.  He would like to get orthotics for his shoes.  He has been wearing boots which helps some but his friend who brings him and would like to discuss orthotics and other treatments to help with his foot.  He denies any swelling or redness to his feet.  He has no specific pain otherwise today.  He has no other concerns today.   Review of Systems  All other systems reviewed and are negative.  Past Medical History:  Diagnosis Date  . Allergy   . Arthritis   . Asthma   . CAD (coronary artery disease)    NSTEMI 7/12:  Cardiac cath on 7/17: pLAD occluded, Dx 30-40%, pRCA 30-40%, mRCA 30-40%.  Proximal LAD was treated with a BMS.  Echo 7/19:  EF 60-65%, mild LVH.    ETT-Myoview 6/14:  Inf thinning, no ischemia, EF 64%, normal study  . Colitis   . Diverticulosis   . Duodenitis    May 2012 with heme positive stools at this time. Endorses only rare streaking of blood now.  Marland Kitchen GERD (gastroesophageal reflux disease)   . Heart murmur    as a child per the pt  . Hiatal hernia   . Hx of hemorrhoids   . Hyperlipidemia   . Hypertension   . Kidney stones   . Microadenoma ([place])    MRI in 2008 demonstrating 4.6 mm area of pituitary gland   . Migraines    Has been evaluated multiple times in past for chronic headache in which pt has had occasional nose bleeds and bloodshot eyes. This lasted for 4-5 months. CT of the head was negative in May 2012.  . Myocardial infarction ([place]) 05/17/2011  . RBBB (right bundle branch block)    Noted on EKG in 2008  . Ulcer     Past Surgical History:  Procedure Laterality Date  . egd  03/05/2011   Dr. Benson Norway  . FLEXIBLE SIGMOIDOSCOPY   03/05/2011   Dr. Benson Norway  . HAMMER TOE SURGERY    . Heart stent    . HERNIA REPAIR     In 20's  . TOOTH EXTRACTION       Current Outpatient Medications:  .  albuterol (PROVENTIL HFA;VENTOLIN HFA) 108 (90 Base) MCG/ACT inhaler, Inhale 2 puffs into the lungs every 4 (four) hours as needed for wheezing or shortness of breath., Disp: 1 Inhaler, Rfl: 2 .  aspirin 81 MG tablet, Take 81 mg by mouth daily.  , Disp: , Rfl:  .  atorvastatin (LIPITOR) 40 MG tablet, Take 1 tablet (40 mg total) by mouth daily., Disp: 90 tablet, Rfl: 3 .  benzonatate (TESSALON) 100 MG capsule, Take 1 capsule (100 mg total) by mouth every 8 (eight) hours., Disp: 21 capsule, Rfl: 0 .  bromocriptine (PARLODEL) 2.5 MG tablet, Take 1 tablet (2.5 mg total) by mouth 2 (two) times daily., Disp: 60 tablet, Rfl: 5 .  cetirizine (ZYRTEC) 10 MG tablet, Take 10 mg by mouth daily., Disp: , Rfl:  .  ezetimibe (ZETIA) 10 MG tablet, Take 1 tablet (10 mg total) by mouth  daily., Disp: 90 tablet, Rfl: 1 .  losartan (COZAAR) 50 MG tablet, Take 1 tablet (50 mg total) by mouth daily., Disp: 90 tablet, Rfl: 1 .  metoprolol tartrate (LOPRESSOR) 25 MG tablet, Take 0.5 tablets (12.5 mg total) by mouth 2 (two) times daily., Disp: 90 tablet, Rfl: 1 .  mometasone (NASONEX) 50 MCG/ACT nasal spray, Place 2 sprays into the nose daily., Disp: 17 g, Rfl: 2 .  omeprazole (PRILOSEC) 40 MG capsule, Take 1 capsule (40 mg total) by mouth daily. 30 minutes before breakfast, Disp: 30 capsule, Rfl: 3 .  predniSONE (DELTASONE) 50 MG tablet, Take 1 tablet daily with breakfast, Disp: 5 tablet, Rfl: 0  No Known Allergies  Social History   Socioeconomic History  . Marital status: Single    Spouse name: Not on file  . Number of children: Not on file  . Years of education: Not on file  . Highest education level: Not on file  Occupational History  . Occupation: Personnel officer-- cleans 3 buildings    Comment: Fairly physical job    Employer: [employer]    Social Needs  . Financial resource strain: Not on file  . Food insecurity:    Worry: Not on file    Inability: Not on file  . Transportation needs:    Medical: Not on file    Non-medical: Not on file  Tobacco Use  . Smoking status: Never Smoker  . Smokeless tobacco: Never Used  Substance and Sexual Activity  . Alcohol use: No  . Drug use: No  . Sexual activity: Not Currently  Lifestyle  . Physical activity:    Days per week: Not on file    Minutes per session: Not on file  . Stress: Not on file  Relationships  . Social connections:    Talks on phone: Not on file    Gets together: Not on file    Attends religious service: Not on file    Active member of club or organization: Not on file    Attends meetings of clubs or organizations: Not on file    Relationship status: Not on file  . Intimate partner violence:    Fear of current or ex partner: Not on file    Emotionally abused: Not on file    Physically abused: Not on file    Forced sexual activity: Not on file  Other Topics Concern  . Not on file  Social History Narrative   Lives with brother and mother   Mother is quite sick, he and his brother take turns taking care of her   No children        Objective:   Physical Exam  General: AAO x3, NAD  Dermatological: Hyperkeratotic lesions bilateral submetatarsal 1 and 4.  Upon debridement there is no underlying ulceration, drainage or any clinical signs of infection present.  Nails are minimally elongated and there is no significant pain to the nails there is no swelling redness or drainage or any signs of infection.  No other open lesions or pre-ulcerative lesion identified today.  Vascular: Dorsalis Pedis artery and Posterior Tibial artery pedal pulses are 2/4 bilateral with immedate capillary fill time.  There is no pain with calf compression, swelling, warmth, erythema.   Neruologic: Grossly intact via light touch bilateral.  Protective threshold with Semmes  Wienstein monofilament intact to all pedal sites bilateral.  Negative Tinel sign bilaterally.  Musculoskeletal: There is prominence of metatarsal heads plantarly with atrophy of the fat pad.  Hallux malleus is present on the right foot.  There is cavus foot type present bilaterally.  There is no other area of tenderness identified to bilateral lower extremities other than the hyperkeratotic lesions.  There is no overlying edema, erythema, increase in warmth.  Muscular strength 5/5 in all groups tested bilateral.  Hammertoe contractures present.  Gait: Unassisted, Nonantalgic.     Assessment & Plan:  57 year old male with symptomatic hyperkeratotic lesions, cavus deformity requesting orthotics -Treatment options discussed including all alternatives, risks, and complications -Etiology of symptoms were discussed -X-rays were obtained and reviewed with the patient.  Mild arthritic changes are present.  Hallux malleus is present on the right foot.  Hammertoe contractures are also present.  There is no definitive evidence of acute fracture or stress fracture identified today. -I debrided the hyperkeratotic lesions x4 without any complications or bleeding at the request. -I also debrided the nails today to even them as a courtesy for him. Liliane Channel evaluated him today he was molded for orthotics today. -RTC 3 weeks to PUO or sooner if any issues are to arise.  Encouraged to call with any questions.  Trula Slade DPM

## 2018-02-02 ENCOUNTER — Encounter: Payer: Self-pay | Admitting: Cardiovascular Disease

## 2018-02-02 ENCOUNTER — Ambulatory Visit (INDEPENDENT_AMBULATORY_CARE_PROVIDER_SITE_OTHER): Payer: Self-pay | Admitting: Cardiovascular Disease

## 2018-02-02 VITALS — BP 116/80 | HR 67 | Ht 74.0 in | Wt 263.0 lb

## 2018-02-02 DIAGNOSIS — I2510 Atherosclerotic heart disease of native coronary artery without angina pectoris: Secondary | ICD-10-CM

## 2018-02-02 DIAGNOSIS — R0602 Shortness of breath: Secondary | ICD-10-CM

## 2018-02-02 DIAGNOSIS — R609 Edema, unspecified: Secondary | ICD-10-CM

## 2018-02-02 NOTE — Progress Notes (Signed)
Cardiology Office Note Date:  02/04/2018   ID:  Michael Pearson, DOB 04/12/1961, MRN [id number]  PCP:  Carollee Herter, Alferd Apa, DO  Cardiologist:  Sherren Mocha, MD    Chief Complaint  Patient presents with  . Coronary Artery Disease     History of Present Illness: Michael Pearson is a 57 y.o. male who presents for follow-up of coronary artery disease.  The patient presented with non-STEMI in 2012 and was found to have severe stenosis of the LAD treated with stent implantation.  He had no other significant CAD.  LV function has been normal.  His last nuclear stress test in 2014 demonstrated no ischemia.  He is here alone today. He complains of generalized fatigue. His mother died in her sleep last week and he is grieving her loss. 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 He complains of shortness of breath with exertion. No edema, orthopnea, or PND. No lightheadedness or syncope. He's had a cough on and off over the last 4-6 weeks.  He feels like he's had abdominal swelling. States his jeans have gone from a 44 to a 48" waist size.   Past Medical History:  Diagnosis Date  . Allergy   . Arthritis   . Asthma   . CAD (coronary artery disease)    NSTEMI 7/12:  Cardiac cath on 7/17: pLAD occluded, Dx 30-40%, pRCA 30-40%, mRCA 30-40%.  Proximal LAD was treated with a BMS.  Echo 7/19:  EF 60-65%, mild LVH.    ETT-Myoview 6/14:  Inf thinning, no ischemia, EF 64%, normal study  . Colitis   . Diverticulosis   . Duodenitis    May 2012 with heme positive stools at this time. Endorses only rare streaking of blood now.  Marland Kitchen GERD (gastroesophageal reflux disease)   . Heart murmur    as a child per the pt  . Hiatal hernia   . Hx of hemorrhoids   . Hyperlipidemia   . Hypertension   . Kidney stones   . Microadenoma ([place])    MRI in 2008 demonstrating 4.6 mm area of pituitary gland   . Migraines    Has been evaluated multiple times in past for chronic headache in which pt has had  occasional nose bleeds and bloodshot eyes. This lasted for 4-5 months. CT of the head was negative in May 2012.  . Myocardial infarction ([place]) 05/17/2011  . RBBB (right bundle branch block)    Noted on EKG in 2008  . Ulcer     Past Surgical History:  Procedure Laterality Date  . egd  03/05/2011   Dr. Benson Norway  . FLEXIBLE SIGMOIDOSCOPY  03/05/2011   Dr. Benson Norway  . HAMMER TOE SURGERY    . Heart stent    . HERNIA REPAIR     In 20's  . TOOTH EXTRACTION      Current Outpatient Medications  Medication Sig Dispense Refill  . albuterol (PROVENTIL HFA;VENTOLIN HFA) 108 (90 Base) MCG/ACT inhaler Inhale 2 puffs into the lungs every 4 (four) hours as needed for wheezing or shortness of breath. 1 Inhaler 2  . aspirin 81 MG tablet Take 81 mg by mouth daily.      Marland Kitchen atorvastatin (LIPITOR) 40 MG tablet Take 1 tablet (40 mg total) by mouth daily. 90 tablet 3  . benzonatate (TESSALON) 100 MG capsule Take 1 capsule (100 mg total) by mouth every 8 (eight) hours. 21 capsule 0  . bromocriptine (PARLODEL) 2.5 MG  tablet Take 1 tablet (2.5 mg total) by mouth 2 (two) times daily. 60 tablet 5  . cetirizine (ZYRTEC) 10 MG tablet Take 10 mg by mouth daily.    Marland Kitchen ezetimibe (ZETIA) 10 MG tablet Take 1 tablet (10 mg total) by mouth daily. 90 tablet 1  . losartan (COZAAR) 50 MG tablet Take 1 tablet (50 mg total) by mouth daily. 90 tablet 1  . metoprolol tartrate (LOPRESSOR) 25 MG tablet Take 0.5 tablets (12.5 mg total) by mouth 2 (two) times daily. 90 tablet 1  . mometasone (NASONEX) 50 MCG/ACT nasal spray Place 2 sprays into the nose daily. 17 g 2  . omeprazole (PRILOSEC) 40 MG capsule Take 1 capsule (40 mg total) by mouth daily. 30 minutes before breakfast 30 capsule 3  . predniSONE (DELTASONE) 50 MG tablet Take 1 tablet daily with breakfast 5 tablet 0   No current facility-administered medications for this visit.     Allergies:   Patient has no known allergies.   Social History:  The patient  reports that he has never  smoked. He has never used smokeless tobacco. He reports that he does not drink alcohol or use drugs.   Family History:  The patient's family history includes Cancer in his father; Colon cancer in his maternal aunt; Colon polyps in his maternal aunt and paternal grandmother; Coronary artery disease in his brother; Heart attack (age of onset: 69) in his father; Hypertension in his brother; Obesity in his brother; Skin cancer in his father; Stroke in his mother.    ROS:  Please see the history of present illness.  Otherwise, review of systems is positive for fatigue.  All other systems are reviewed and negative.    PHYSICAL EXAM: VS:  BP 116/80   Pulse 67   Ht 6\' 2"  (1.88 m)   Wt 263 lb (119.3 kg)   SpO2 96%   BMI 33.77 kg/m  , BMI Body mass index is 33.77 kg/m. GEN: Well nourished, well developed, pleasant obese male in no acute distress  HEENT: normal  Neck: no JVD, no masses. No carotid bruits Cardiac: RRR without murmur or gallop      Respiratory:  clear to auscultation bilaterally, normal work of breathing GI: soft, nontender, nondistended, + BS MS: no deformity or atrophy  Ext: no pretibial edema, pedal pulses 2+= bilaterally Skin: warm and dry, no rash Neuro:  Strength and sensation are intact Psych: euthymic mood, full affect  EKG:  EKG is ordered today. The ekg ordered today shows NSR with RBBB, HR 67 bpm  Recent Labs: 08/25/2017: TSH 2.18 11/24/2017: ALT 31; BUN 11; Creatinine, Ser 0.77; Hemoglobin 14.2; Platelets 197.0; Potassium 3.8; Sodium 139   Lipid Panel     Component Value Date/Time   CHOL 138 11/24/2017 1020   TRIG 78.0 11/24/2017 1020   HDL 56.70 11/24/2017 1020   CHOLHDL 2 11/24/2017 1020   VLDL 15.6 11/24/2017 1020   LDLCALC 65 11/24/2017 1020     Wt Readings from Last 3 Encounters:  02/02/18 263 lb (119.3 kg)  01/21/18 230 lb (104.3 kg)  11/24/17 263 lb 12.8 oz (119.7 kg)    ASSESSMENT AND PLAN: 1.  Coronary artery disease, native vessel, without  angina: The patient is treated with aspirin, a high intensity statin drug, and a beta-blocker.  2.  Hypertension: Blood pressure is well controlled on losartan and metoprolol.  3.  Hyperlipidemia: Treated with atorvastatin and ezetimibe.  4. Shortness of breath/edema: subjective abdominal swelling per patient. I  have recommended an echo to evaluate for LV systolic or diastolic dysfunction, or right heart dysfunction.   Current medicines are reviewed with the patient today.  The patient does not have concerns regarding medicines.  Labs/ tests ordered today include:   Orders Placed This Encounter  Procedures  . EKG 12-Lead  . ECHOCARDIOGRAM COMPLETE    Disposition:   FU one year  Signed, Sherren Mocha, MD  02/04/2018 1:56 PM    [facility] [address], [place], [place]  [address] Phone: [phone]; Fax: [phone]

## 2018-02-02 NOTE — Patient Instructions (Addendum)
Medication Instructions:  Your provider recommends that you continue on your current medications as directed. Please refer to the Current Medication list given to you today.    Labwork: None  Testing/Procedures: Your provider has requested that you have an echocardiogram. Your echo is scheduled next Tuesday, April 9th, 2019. Please arrive at 8:00AM. Echocardiography is a painless test that uses sound waves to create images of your heart. It provides your doctor with information about the size and shape of your heart and how well your heart's chambers and valves are working. This procedure takes approximately one hour. There are no restrictions for this procedure.  Follow-Up: Your provider wants you to follow-up in: 1 year with Dr. Burt Knack. You will receive a reminder letter in the mail two months in advance. If you don't receive a letter, please call our office to schedule the follow-up appointment.    Any Other Special Instructions Will Be Listed Below (If Applicable).     If you need a refill on your cardiac medications before your next appointment, please call your pharmacy.

## 2018-02-07 ENCOUNTER — Ambulatory Visit (HOSPITAL_COMMUNITY): Payer: Self-pay | Attending: Cardiovascular Disease

## 2018-02-07 ENCOUNTER — Other Ambulatory Visit: Payer: Self-pay

## 2018-02-07 DIAGNOSIS — R0602 Shortness of breath: Secondary | ICD-10-CM | POA: Insufficient documentation

## 2018-02-07 DIAGNOSIS — R609 Edema, unspecified: Secondary | ICD-10-CM | POA: Insufficient documentation

## 2018-02-07 DIAGNOSIS — Z8249 Family history of ischemic heart disease and other diseases of the circulatory system: Secondary | ICD-10-CM | POA: Insufficient documentation

## 2018-02-07 DIAGNOSIS — I2510 Atherosclerotic heart disease of native coronary artery without angina pectoris: Secondary | ICD-10-CM | POA: Insufficient documentation

## 2018-02-07 DIAGNOSIS — I10 Essential (primary) hypertension: Secondary | ICD-10-CM | POA: Insufficient documentation

## 2018-02-07 DIAGNOSIS — I4510 Unspecified right bundle-branch block: Secondary | ICD-10-CM | POA: Insufficient documentation

## 2018-02-07 DIAGNOSIS — E785 Hyperlipidemia, unspecified: Secondary | ICD-10-CM | POA: Insufficient documentation

## 2018-02-07 DIAGNOSIS — I252 Old myocardial infarction: Secondary | ICD-10-CM | POA: Insufficient documentation

## 2018-02-07 DIAGNOSIS — J45909 Unspecified asthma, uncomplicated: Secondary | ICD-10-CM | POA: Insufficient documentation

## 2018-02-08 ENCOUNTER — Telehealth: Payer: Self-pay | Admitting: Cardiovascular Disease

## 2018-02-08 NOTE — Telephone Encounter (Signed)
New message  Pt verbalized that he is returning call for RN  For his ECHO results

## 2018-02-08 NOTE — Telephone Encounter (Signed)
Informed DPR that Dr. Burt Knack has not yet reviewed echo. He understands he will be called when results are available.

## 2018-02-23 ENCOUNTER — Other Ambulatory Visit: Payer: Self-pay | Admitting: Orthotics

## 2018-03-02 ENCOUNTER — Ambulatory Visit (INDEPENDENT_AMBULATORY_CARE_PROVIDER_SITE_OTHER): Payer: Self-pay | Admitting: Orthotics

## 2018-03-02 DIAGNOSIS — M2042 Other hammer toe(s) (acquired), left foot: Secondary | ICD-10-CM

## 2018-03-02 DIAGNOSIS — M2041 Other hammer toe(s) (acquired), right foot: Secondary | ICD-10-CM

## 2018-03-02 DIAGNOSIS — Q828 Other specified congenital malformations of skin: Secondary | ICD-10-CM

## 2018-03-02 DIAGNOSIS — M216X9 Other acquired deformities of unspecified foot: Secondary | ICD-10-CM

## 2018-03-02 NOTE — Progress Notes (Signed)
Patient came in today to pick up custom made foot orthotics.  The goals were accomplished and the patient reported no dissatisfaction with said orthotics.  Patient was advised of breakin period and how to report any issues. 

## 2018-04-24 ENCOUNTER — Other Ambulatory Visit: Payer: Self-pay | Admitting: Family Medicine

## 2018-04-24 DIAGNOSIS — I10 Essential (primary) hypertension: Secondary | ICD-10-CM

## 2018-04-24 DIAGNOSIS — E785 Hyperlipidemia, unspecified: Secondary | ICD-10-CM

## 2018-05-25 ENCOUNTER — Encounter: Payer: Self-pay | Admitting: Family Medicine

## 2018-05-25 ENCOUNTER — Ambulatory Visit (INDEPENDENT_AMBULATORY_CARE_PROVIDER_SITE_OTHER): Payer: Self-pay | Admitting: Family Medicine

## 2018-05-25 VITALS — BP 122/80 | HR 64 | Temp 97.9°F | Resp 16 | Ht 74.0 in | Wt 280.4 lb

## 2018-05-25 DIAGNOSIS — E785 Hyperlipidemia, unspecified: Secondary | ICD-10-CM

## 2018-05-25 DIAGNOSIS — D229 Melanocytic nevi, unspecified: Secondary | ICD-10-CM

## 2018-05-25 DIAGNOSIS — I10 Essential (primary) hypertension: Secondary | ICD-10-CM

## 2018-05-25 DIAGNOSIS — K589 Irritable bowel syndrome without diarrhea: Secondary | ICD-10-CM

## 2018-05-25 DIAGNOSIS — D352 Benign neoplasm of pituitary gland: Secondary | ICD-10-CM

## 2018-05-25 LAB — COMPREHENSIVE METABOLIC PANEL
ALBUMIN: 4.3 g/dL (ref 3.5–5.2)
ALT: 65 U/L — AB (ref 0–53)
AST: 51 U/L — AB (ref 0–37)
Alkaline Phosphatase: 61 U/L (ref 39–117)
BILIRUBIN TOTAL: 0.8 mg/dL (ref 0.2–1.2)
BUN: 13 mg/dL (ref 6–23)
CHLORIDE: 104 meq/L (ref 96–112)
CO2: 29 meq/L (ref 19–32)
CREATININE: 0.84 mg/dL (ref 0.40–1.50)
Calcium: 9.6 mg/dL (ref 8.4–10.5)
GFR: 99.96 mL/min (ref >60.00)
Glucose, Bld: 97 mg/dL (ref 70–99)
Potassium: 4.4 mEq/L (ref 3.5–5.1)
Sodium: 140 mEq/L (ref 135–145)
Total Protein: 6.9 g/dL (ref 6.0–8.3)

## 2018-05-25 LAB — LIPID PANEL
CHOLESTEROL: 102 mg/dL (ref 0–200)
HDL: 48.8 mg/dL (ref >39.00)
LDL Cholesterol: 41 mg/dL (ref 0–99)
NonHDL: 52.93
TRIGLYCERIDES: 62 mg/dL (ref 0.0–149.0)
Total CHOL/HDL Ratio: 2
VLDL: 12.4 mg/dL (ref 0.0–40.0)

## 2018-05-25 NOTE — Progress Notes (Signed)
Patient ID: Michael Pearson, male    DOB: 01-Sep-1961  Age: 57 y.o. MRN: [id number]    Subjective:  Subjective  HPI KOLBEY TEICHERT presents for f/u bp and cholesterol.  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  He gets cramping prior to bm.  He does not complain about constipation or diarrhea.   Review of Systems  Constitutional: Negative for fever.  HENT: Negative for congestion.   Respiratory: Negative for shortness of breath.   Cardiovascular: Negative for chest pain, palpitations and leg swelling.  Gastrointestinal: Negative for abdominal pain, blood in stool, constipation, diarrhea and nausea.  Genitourinary: Negative for dysuria and frequency.  Skin: Negative for rash.  Allergic/Immunologic: Negative for environmental allergies.  Neurological: Negative for dizziness and headaches.  Psychiatric/Behavioral: The patient is not nervous/anxious.     History Past Medical History:  Diagnosis Date  . Allergy   . Arthritis   . Asthma   . CAD (coronary artery disease)    NSTEMI 7/12:  Cardiac cath on 7/17: pLAD occluded, Dx 30-40%, pRCA 30-40%, mRCA 30-40%.  Proximal LAD was treated with a BMS.  Echo 7/19:  EF 60-65%, mild LVH.    ETT-Myoview 6/14:  Inf thinning, no ischemia, EF 64%, normal study  . Colitis   . Diverticulosis   . Duodenitis    May 2012 with heme positive stools at this time. Endorses only rare streaking of blood now.  Marland Kitchen GERD (gastroesophageal reflux disease)   . Heart murmur    as a child per the pt  . Hiatal hernia   . Hx of hemorrhoids   . Hyperlipidemia   . Hypertension   . Kidney stones   . Microadenoma ([place])    MRI in 2008 demonstrating 4.6 mm area of pituitary gland   . Migraines    Has been evaluated multiple times in past for chronic headache in which  pt has had occasional nose bleeds and bloodshot eyes. This lasted for 4-5 months. CT of the head was negative in May 2012.  . Myocardial infarction ([place]) 05/17/2011  . RBBB (right bundle branch block)    Noted on EKG in 2008  . Ulcer     He has a past surgical history that includes Hernia repair; Tooth extraction; Hammer toe surgery; egd (03/05/2011); Flexible sigmoidoscopy (03/05/2011); and Heart stent.   His family history includes Cancer in his father; Colon cancer in his maternal aunt; Colon polyps in his maternal aunt and paternal grandmother; Coronary artery disease in his brother; Heart attack (age of onset: 40) in his father; Hypertension in his brother; Obesity in his brother; Skin cancer in his father; Stroke in his mother.He reports that he has never smoked. He has never used smokeless tobacco. He reports that he does not drink alcohol or use drugs.  Current Outpatient Medications on File Prior to Visit  Medication Sig Dispense Refill  . albuterol (PROVENTIL HFA;VENTOLIN HFA) 108 (90 Base) MCG/ACT inhaler Inhale 2 puffs into the lungs every 4 (four) hours as needed for wheezing or shortness of breath. 1 Inhaler 2  . aspirin 81 MG tablet Take 81 mg by mouth daily.      Marland Kitchen atorvastatin (LIPITOR) 40 MG tablet Take  1 tablet (40 mg total) by mouth daily. 90 tablet 3  . benzonatate (TESSALON) 100 MG capsule Take 1 capsule (100 mg total) by mouth every 8 (eight) hours. 21 capsule 0  . bromocriptine (PARLODEL) 2.5 MG tablet Take 1 tablet (2.5 mg total) by mouth 2 (two) times daily. 60 tablet 5  . cetirizine (ZYRTEC) 10 MG tablet Take 10 mg by mouth daily.    Marland Kitchen ezetimibe (ZETIA) 10 MG tablet TAKE ONE TABLET BY MOUTH ONE TIME DAILY  90 tablet 0  . losartan (COZAAR) 50 MG tablet TAKE ONE TABLET BY MOUTH ONE TIME DAILY  90 tablet 0  . metoprolol tartrate (LOPRESSOR) 25 MG tablet TAKE 1/2 TABLET BY MOUTH TWICE A DAY 90 tablet 0  . mometasone (NASONEX) 50 MCG/ACT nasal spray Place 2 sprays into the nose  daily. 17 g 2  . omeprazole (PRILOSEC) 40 MG capsule Take 1 capsule (40 mg total) by mouth daily. 30 minutes before breakfast 30 capsule 3   No current facility-administered medications on file prior to visit.      Objective:  Objective  Physical Exam  Constitutional: He is oriented to person, place, and time. Vital signs are normal. He appears well-developed and well-nourished. He is sleeping. No distress.  HENT:  Head: Normocephalic and atraumatic.    Mouth/Throat: Oropharynx is clear and moist.  Eyes: Pupils are equal, round, and reactive to light. EOM are normal.  Neck: Normal range of motion. Neck supple. No thyromegaly present.  Cardiovascular: Normal rate, regular rhythm and normal heart sounds.  No murmur heard. Pulmonary/Chest: Effort normal and breath sounds normal. No respiratory distress. He has no wheezes. He has no rales. He exhibits no tenderness.  Abdominal: Soft. He exhibits no distension and no mass. There is no tenderness. There is no rebound and no guarding. No hernia.  Musculoskeletal: He exhibits no edema or tenderness.  Neurological: He is alert and oriented to person, place, and time.  Skin: Skin is warm and dry.  Psychiatric: He has a normal mood and affect. His behavior is normal. Judgment and thought content normal.  Nursing note and vitals reviewed.  BP 122/80 (BP Location: Left Arm, Cuff Size: Large)   Pulse 64   Temp 97.9 F (36.6 C) (Oral)   Resp 16   Ht 6\' 2"  (1.88 m)   Wt 280 lb 6.4 oz (127.2 kg)   SpO2 95%   BMI 36.00 kg/m  Wt Readings from Last 3 Encounters:  05/25/18 280 lb 6.4 oz (127.2 kg)  02/02/18 263 lb (119.3 kg)  01/21/18 230 lb (104.3 kg)     Lab Results  Component Value Date   WBC 4.7 11/24/2017   HGB 14.2 11/24/2017   HCT 42.8 11/24/2017   PLT 197.0 11/24/2017   GLUCOSE 101 (H) 11/24/2017   CHOL 138 11/24/2017   TRIG 78.0 11/24/2017   HDL 56.70 11/24/2017   LDLCALC 65 11/24/2017   ALT 31 11/24/2017   AST 23  11/24/2017   NA 139 11/24/2017   K 3.8 11/24/2017   CL 102 11/24/2017   CREATININE 0.77 11/24/2017   BUN 11 11/24/2017   CO2 29 11/24/2017   TSH 2.18 08/25/2017   PSA 0.33 11/23/2016   INR 0.97 05/17/2011   HGBA1C 5.8 09/17/2014   MICROALBUR 1.5 11/20/2015    Dg Chest 2 View  Result Date: 01/21/2018 CLINICAL DATA:  Cough since March, sore throat, history asthma, coronary artery disease, hypertension EXAM: CHEST - 2 VIEW COMPARISON:  05/17/2011 FINDINGS: Normal  heart size, mediastinal contours, and pulmonary vascularity. Minimal bronchitic changes. No pulmonary infiltrate, pleural effusion or pneumothorax. Bones unremarkable. IMPRESSION: Minimal bronchitic changes without infiltrate. Electronically Signed   By: Lavonia Dana M.D.   On: 01/21/2018 12:28     Assessment & Plan:  Plan  I have discontinued Almer W. Cajamarca's predniSONE. I am also having him maintain his aspirin, cetirizine, omeprazole, mometasone, bromocriptine, atorvastatin, albuterol, benzonatate, losartan, metoprolol tartrate, and ezetimibe.  No orders of the defined types were placed in this encounter.   Problem List Items Addressed This Visit      Unprioritized   Essential hypertension    Well controlled, no changes to meds. Encouraged heart healthy diet such as the DASH diet and exercise as tolerated.       Hyperlipidemia LDL goal <100 - Primary    Tolerating statin, encouraged heart healthy diet, avoid trans fats, minimize simple carbs and saturated fats. Increase exercise as tolerated      Relevant Orders   Comprehensive metabolic panel   Lipid panel   Pituitary adenoma [facility])   Relevant Orders   Prolactin   Ambulatory referral to Endocrinology    Other Visit Diagnoses    Suspicious nevus       Relevant Orders   Ambulatory referral to Dermatology   Irritable bowel syndrome without diarrhea       Relevant Orders   Ambulatory referral to Gastroenterology      Follow-up: Return in about 6 months (around  11/25/2018) for hypertension, hyperlipidemia.  [name], DO

## 2018-05-25 NOTE — Patient Instructions (Signed)

## 2018-05-25 NOTE — Assessment & Plan Note (Signed)
Tolerating statin, encouraged heart healthy diet, avoid trans fats, minimize simple carbs and saturated fats. Increase exercise as tolerated 

## 2018-05-25 NOTE — Assessment & Plan Note (Signed)
Well controlled, no changes to meds. Encouraged heart healthy diet such as the DASH diet and exercise as tolerated.  °

## 2018-05-26 LAB — PROLACTIN: PROLACTIN: 13.8 ng/mL (ref 2.0–18.0)

## 2018-05-29 ENCOUNTER — Telehealth: Payer: Self-pay

## 2018-05-29 DIAGNOSIS — R748 Abnormal levels of other serum enzymes: Secondary | ICD-10-CM

## 2018-05-29 DIAGNOSIS — E785 Hyperlipidemia, unspecified: Secondary | ICD-10-CM

## 2018-05-29 NOTE — Telephone Encounter (Signed)
Author phoned pt to review lab results per Dr. Etter Sjogren. Pt. Unable to be reached, but Marden Noble, caretaker, answered phone, and results relayed to him. Marden Noble was unaware if pt. was taking tylenol or any other different medications, but denied pt. having any ETOH use. Repeat lab appointment made with Marden Noble, and Marden Noble stated he would notify and remind pt.

## 2018-05-29 NOTE — Telephone Encounter (Signed)
-----   Message from [name], DO sent at 05/28/2018  7:34 PM EDT ----- Liver function slightly elevated--- any tylenol, etoh or other otc meds? Recheck 3 months---  Cmp, lipid

## 2018-06-14 ENCOUNTER — Ambulatory Visit: Payer: Self-pay | Admitting: Physician Assistant

## 2018-06-14 ENCOUNTER — Encounter: Payer: Self-pay | Admitting: Physician Assistant

## 2018-06-14 VITALS — BP 110/76 | HR 80 | Ht 74.0 in | Wt 285.8 lb

## 2018-06-14 DIAGNOSIS — R1084 Generalized abdominal pain: Secondary | ICD-10-CM

## 2018-06-14 DIAGNOSIS — K219 Gastro-esophageal reflux disease without esophagitis: Secondary | ICD-10-CM

## 2018-06-14 DIAGNOSIS — R1031 Right lower quadrant pain: Secondary | ICD-10-CM

## 2018-06-14 DIAGNOSIS — R194 Change in bowel habit: Secondary | ICD-10-CM

## 2018-06-14 DIAGNOSIS — R1032 Left lower quadrant pain: Secondary | ICD-10-CM

## 2018-06-14 DIAGNOSIS — R1115 Cyclical vomiting syndrome unrelated to migraine: Secondary | ICD-10-CM

## 2018-06-14 MED ORDER — NA SULFATE-K SULFATE-MG SULF 17.5-3.13-1.6 GM/177ML PO SOLN: 1.0000 | ORAL | 0 refills | Status: DC

## 2018-06-14 NOTE — Patient Instructions (Signed)

## 2018-06-14 NOTE — Progress Notes (Addendum)
Chief Complaint: Change in bowel habits, abdominal pain and vomiting  HPI:    Michael Pearson is a 57 year old male with a past medical history as listed below including CAD (02/07/2018 echo LVEF 55-60%), follows with Dr. Hilarie Fredrickson and presents to clinic today with a complaint of change in bowel habits, lower abdominal pain and vomiting.    01/21/2016 office visit Dr. Hilarie Fredrickson.  At that time as discussed he had a colonoscopy 04/05/2013 which showed mild diverticulosis in the sigmoid with associated erythema, otherwise normal.  Biopsy showed focally ulcerated colorectal mucosa with no evidence of IBD or dysplasia.  Repeat was recommended in 10 years.  Most consistent with SCAD.  At that time was taking MiraLAX daily with regular bowel movements.  Was describing some severe heartburn and regurgitation.  At that time he was started on Omeprazole 40 mg daily and it was recommended he have an EGD due to dysphagia.  His history of segmental colitis associated diverticulosis was discussed and he is continued on MiraLAX.    02/17/2016 EGD with no endoscopic esophageal abnormality to explain dysphasia, was dilated to 18 mm, 1 to 2 cm hiatal hernia and otherwise normal.    05/25/2018 office visit with PCP discussed frequent bowel movement sometimes 10-15 a day with cramping prior.  CMP with minimally elevated LFTs AST of 51 and ALT of 65.  Patient was told to recheck these in 3 months by PCP.    Today, explains that about 3-4 times a month over the past 6-7 months he has had days where he will have at least 13-14 bowel movements a day.  He describes these as a solid formed stool #4 on the [facility] scale.  Also with some hard cramping beforehand typically in his lower abdomen which is relieved after a bowel movement, but can also have times of vomiting during these episodes.  Also abdominal distention where he goes from a size 42 pant up to a size 52 pants.  In between these days of multiple bowel movements he will have 3-4 stools a  day which are normal and formed.  Does continue MiraLAX once daily in the morning.    Continues on Omeprazole 40 mg daily which helps with his heartburn.  Also elevates the head of his bed.    Denies fever, chills, weight loss, anorexia, nausea or symptoms that awaken him from sleep.  Past Medical History:  Diagnosis Date  . Allergy   . Arthritis   . Asthma   . CAD (coronary artery disease)    NSTEMI 7/12:  Cardiac cath on 7/17: pLAD occluded, Dx 30-40%, pRCA 30-40%, mRCA 30-40%.  Proximal LAD was treated with a BMS.  Echo 7/19:  EF 60-65%, mild LVH.    ETT-Myoview 6/14:  Inf thinning, no ischemia, EF 64%, normal study  . Colitis   . Diverticulosis   . Duodenitis    May 2012 with heme positive stools at this time. Endorses only rare streaking of blood now.  Marland Kitchen GERD (gastroesophageal reflux disease)   . Heart murmur    as a child per the pt  . Hiatal hernia   . Hx of hemorrhoids   . Hyperlipidemia   . Hypertension   . Kidney stones   . Microadenoma ([place])    MRI in 2008 demonstrating 4.6 mm area of pituitary gland   . Migraines    Has been evaluated multiple times in past for chronic headache in which pt has had occasional nose bleeds and bloodshot eyes. This  lasted for 4-5 months. CT of the head was negative in May 2012.  . Myocardial infarction ([place]) 05/17/2011  . RBBB (right bundle branch block)    Noted on EKG in 2008  . Ulcer     Past Surgical History:  Procedure Laterality Date  . egd  03/05/2011   Dr. Benson Norway  . FLEXIBLE SIGMOIDOSCOPY  03/05/2011   Dr. Benson Norway  . HAMMER TOE SURGERY    . Heart stent    . HERNIA REPAIR     In 20's  . TOOTH EXTRACTION      Current Outpatient Medications  Medication Sig Dispense Refill  . albuterol (PROVENTIL HFA;VENTOLIN HFA) 108 (90 Base) MCG/ACT inhaler Inhale 2 puffs into the lungs every 4 (four) hours as needed for wheezing or shortness of breath. 1 Inhaler 2  . aspirin 81 MG tablet Take 81 mg by mouth daily.      Marland Kitchen atorvastatin (LIPITOR)  40 MG tablet Take 1 tablet (40 mg total) by mouth daily. 90 tablet 3  . benzonatate (TESSALON) 100 MG capsule Take 1 capsule (100 mg total) by mouth every 8 (eight) hours. 21 capsule 0  . bromocriptine (PARLODEL) 2.5 MG tablet Take 1 tablet (2.5 mg total) by mouth 2 (two) times daily. 60 tablet 5  . cetirizine (ZYRTEC) 10 MG tablet Take 10 mg by mouth daily.    Marland Kitchen ezetimibe (ZETIA) 10 MG tablet TAKE ONE TABLET BY MOUTH ONE TIME DAILY  90 tablet 0  . losartan (COZAAR) 50 MG tablet TAKE ONE TABLET BY MOUTH ONE TIME DAILY  90 tablet 0  . metoprolol tartrate (LOPRESSOR) 25 MG tablet TAKE 1/2 TABLET BY MOUTH TWICE A DAY 90 tablet 0  . mometasone (NASONEX) 50 MCG/ACT nasal spray Place 2 sprays into the nose daily. 17 g 2  . omeprazole (PRILOSEC) 40 MG capsule Take 1 capsule (40 mg total) by mouth daily. 30 minutes before breakfast 30 capsule 3   No current facility-administered medications for this visit.     Allergies as of 06/14/2018  . (No Known Allergies)    Family History  Problem Relation Age of Onset  . Stroke Mother   . Heart attack Father 65       MI  . Skin cancer Father   . Cancer Father        ? type "rare"  . Obesity Brother   . Coronary artery disease Brother        Possible, pt not sure of specifics  . Hypertension Brother   . Colon polyps Maternal Aunt   . Colon cancer Maternal Aunt   . Colon polyps Paternal Grandmother   . Other Neg Hx     Social History   Socioeconomic History  . Marital status: Single    Spouse name: Not on file  . Number of children: Not on file  . Years of education: Not on file  . Highest education level: Not on file  Occupational History  . Occupation: Personnel officer-- cleans 3 buildings    Comment: Fairly physical job    Employer: [employer]  Social Needs  . Financial resource strain: Not on file  . Food insecurity:    Worry: Not on file    Inability: Not on file  . Transportation needs:    Medical: Not on file     Non-medical: Not on file  Tobacco Use  . Smoking status: Never Smoker  . Smokeless tobacco: Never Used  Substance and Sexual Activity  . Alcohol  use: No  . Drug use: No  . Sexual activity: Not Currently  Lifestyle  . Physical activity:    Days per week: Not on file    Minutes per session: Not on file  . Stress: Not on file  Relationships  . Social connections:    Talks on phone: Not on file    Gets together: Not on file    Attends religious service: Not on file    Active member of club or organization: Not on file    Attends meetings of clubs or organizations: Not on file    Relationship status: Not on file  . Intimate partner violence:    Fear of current or ex partner: Not on file    Emotionally abused: Not on file    Physically abused: Not on file    Forced sexual activity: Not on file  Other Topics Concern  . Not on file  Social History Narrative   Lives with brother and mother   Mother is quite sick, he and his brother take turns taking care of her   No children    Review of Systems:    Constitutional: No weight loss, fever or chills Cardiovascular: No chest pain Respiratory: No SOB  Gastrointestinal: See HPI and otherwise negative   Physical Exam:  Vital signs: BP 110/76   Pulse 80   Ht 6\' 2"  (1.88 m)   Wt 285 lb 12.8 oz (129.6 kg)   BMI 36.69 kg/m    Constitutional:   Pleasant overweight Caucasian male appears to be in NAD, Well developed, Well nourished, alert and cooperative Respiratory: Respirations even and unlabored. Lungs clear to auscultation bilaterally.   No wheezes, crackles, or rhonchi.  Cardiovascular: Normal S1, S2. No MRG. Regular rate and rhythm. No peripheral edema, cyanosis or pallor.  Gastrointestinal:  Soft, mild distension, nontender. No rebound or guarding. Normal bowel sounds. No appreciable masses or hepatomegaly. Rectal:  Not performed.  Psychiatric:Demonstrates good judgement and reason without abnormal affect or behaviors.  MOST  RECENT LABS AND IMAGING: CBC    Component Value Date/Time   WBC 4.7 11/24/2017 1020   RBC 4.46 11/24/2017 1020   HGB 14.2 11/24/2017 1020   HCT 42.8 11/24/2017 1020   PLT 197.0 11/24/2017 1020   MCV 96.0 11/24/2017 1020   MCH 31.2 02/04/2013 0400   MCHC 33.1 11/24/2017 1020   RDW 14.1 11/24/2017 1020   LYMPHSABS 1.1 11/24/2017 1020   MONOABS 0.6 11/24/2017 1020   EOSABS 0.1 11/24/2017 1020   BASOSABS 0.0 11/24/2017 1020    CMP     Component Value Date/Time   NA 140 05/25/2018 1017   K 4.4 05/25/2018 1017   CL 104 05/25/2018 1017   CO2 29 05/25/2018 1017   GLUCOSE 97 05/25/2018 1017   BUN 13 05/25/2018 1017   CREATININE 0.84 05/25/2018 1017   CALCIUM 9.6 05/25/2018 1017   PROT 6.9 05/25/2018 1017   ALBUMIN 4.3 05/25/2018 1017   AST 51 (H) 05/25/2018 1017   ALT 65 (H) 05/25/2018 1017   ALKPHOS 61 05/25/2018 1017   BILITOT 0.8 05/25/2018 1017   GFRNONAA >90 02/04/2013 0400   GFRAA >90 02/04/2013 0400    Assessment: 1.  Change in bowel habits: 3-4 times a month over the past 6 to 7 months will have days of 10-14 soft solid formed stools, on these days has abdominal cramping and some vomiting as well as increasing abdominal distention, history of diverticular disease with SCAD, concern for stricturing related to  diverticular disease versus IBS 2.  Abdominal pain: with above 3.  Vomiting: with above 4.  GERD: Controlled on Omeprazole 40 mg daily and elevated head of bed  Plan: 1.  Scheduled patient for colonoscopy with Dr. Hilarie Fredrickson in the [facility].  Discussed risk, benefits, limitations and alternatives and patient agrees to proceed. 2.  Continue Omeprazole 40 mg daily 3.  Continue MiraLAX daily for now 4.  Please await further recommendations from Dr. Hilarie Fredrickson after time of procedure.  Ellouise Newer, PA-C [facility] 06/14/2018, 10:26 AM  Cc: [name], *   Addendum: Reviewed and agree with management. Pyrtle, Lajuan Lines, MD

## 2018-06-26 ENCOUNTER — Encounter: Payer: Self-pay | Admitting: Internal Medicine

## 2018-06-26 ENCOUNTER — Other Ambulatory Visit: Payer: Self-pay | Admitting: Family Medicine

## 2018-06-26 ENCOUNTER — Ambulatory Visit (AMBULATORY_SURGERY_CENTER): Payer: Self-pay | Admitting: Internal Medicine

## 2018-06-26 VITALS — BP 145/75 | HR 64 | Temp 98.2°F | Resp 16 | Ht 74.0 in | Wt 285.0 lb

## 2018-06-26 DIAGNOSIS — R194 Change in bowel habit: Secondary | ICD-10-CM

## 2018-06-26 DIAGNOSIS — I10 Essential (primary) hypertension: Secondary | ICD-10-CM

## 2018-06-26 MED ORDER — SODIUM CHLORIDE 0.9 % IV SOLN: 500.0000 mL | Freq: Once | INTRAVENOUS | Status: DC

## 2018-06-26 NOTE — Progress Notes (Signed)
Report given to PACU, vss 

## 2018-06-26 NOTE — Progress Notes (Signed)
Michael Pearson, pt's friend and he helps pt with financial issues and he aided patient with his medical hx and medications. Maw  Michael Pearson reported pt signs his own legal documents. maw

## 2018-06-26 NOTE — Op Note (Signed)
[facility] Patient Name: Michael Pearson Procedure Date: 06/26/2018 8:35 AM MRN: [id number] Endoscopist: Jerene Bears , MD Age: 57 Referring MD:  Date of Birth: Feb 24, 1961 Gender: Male Account #: [id number] Procedure:                Colonoscopy Indications:              Lower abdominal pain, Change in bowel habits,                            history of acute colitis changes associated with                            diverticulosis in 2014 Medicines:                Monitored Anesthesia Care Procedure:                Pre-Anesthesia Assessment:                           - Prior to the procedure, a History and Physical                            was performed, and patient medications and                            allergies were reviewed. The patient's tolerance of                            previous anesthesia was also reviewed. The risks                            and benefits of the procedure and the sedation                            options and risks were discussed with the patient.                            All questions were answered, and informed consent                            was obtained. Prior Anticoagulants: The patient has                            taken no previous anticoagulant or antiplatelet                            agents. ASA Grade Assessment: II - A patient with                            mild systemic disease. After reviewing the risks                            and benefits, the patient was deemed in  satisfactory condition to undergo the procedure.                           After obtaining informed consent, the colonoscope                            was passed under direct vision. Throughout the                            procedure, the patient's blood pressure, pulse, and                            oxygen saturations were monitored continuously. The                            Colonoscope was introduced through the anus and                            advanced to the terminal ileum. The colonoscopy was                            performed without difficulty. The patient tolerated                            the procedure well. The quality of the bowel                            preparation was good. The terminal ileum, ileocecal                            valve, appendiceal orifice, and rectum were                            photographed. Scope In: 8:45:28 AM Scope Out: 8:58:51 AM Scope Withdrawal Time: 0 hours 10 minutes 42 seconds  Total Procedure Duration: 0 hours 13 minutes 23 seconds  Findings:                 The digital rectal exam was normal.                           The terminal ileum appeared normal.                           Multiple small-mouthed diverticula were found in                            the sigmoid colon and distal descending colon.                            Erythema was seen in association with the                            diverticular openings in the sigmoid colon.  The exam was otherwise normal throughout the                            examined colon.                           External hemorrhoids were found during                            retroflexion. The hemorrhoids were small.                           Biopsies were taken with a cold forceps in the                            sigmoid colon, in the distal descending colon (left                            colon Jar 2), in the transverse colon and in the                            ascending colon (right colon Jar 1) for histology. Complications:            No immediate complications. Estimated Blood Loss:     Estimated blood loss was minimal. Impression:               - The examined portion of the ileum was normal.                           - Mild diverticulosis in the sigmoid colon and in                            the distal descending colon. Erythema was seen in                            association  with the diverticular opening.                           - External hemorrhoids.                           - Biopsies were taken with a cold forceps for                            histology in the sigmoid colon, in the distal                            descending colon, in the transverse colon and in                            the ascending colon. Recommendation:           - Patient has a contact number available for  emergencies. The signs and symptoms of potential                            delayed complications were discussed with the                            patient. Return to normal activities tomorrow.                            Written discharge instructions were provided to the                            patient.                           - Resume previous diet.                           - Continue present medications.                           - Await pathology results. May need 5-ASA treatment                            for SCAD.                           - Repeat colonoscopy in 10 years for screening                            purposes. Jerene Bears, MD 06/26/2018 9:11:09 AM This report has been signed electronically.

## 2018-06-26 NOTE — Progress Notes (Signed)
Called to room to assist during endoscopic procedure.  Patient ID and intended procedure confirmed with present staff. Received instructions for my participation in the procedure from the performing physician.  

## 2018-06-26 NOTE — Patient Instructions (Signed)
Please read handouts on Diverticulosis and Hemorrhoids. Continue present medications.     YOU HAD AN ENDOSCOPIC PROCEDURE TODAY AT [facility]:   Refer to the procedure report that was given to you for any specific questions about what was found during the examination.  If the procedure report does not answer your questions, please call your gastroenterologist to clarify.  If you requested that your care partner not be given the details of your procedure findings, then the procedure report has been included in a sealed envelope for you to review at your convenience later.  YOU SHOULD EXPECT: Some feelings of bloating in the abdomen. Passage of more gas than usual.  Walking can help get rid of the air that was put into your GI tract during the procedure and reduce the bloating. If you had a lower endoscopy (such as a colonoscopy or flexible sigmoidoscopy) you may notice spotting of blood in your stool or on the toilet paper. If you underwent a bowel prep for your procedure, you may not have a normal bowel movement for a few days.  Please Note:  You might notice some irritation and congestion in your nose or some drainage.  This is from the oxygen used during your procedure.  There is no need for concern and it should clear up in a day or so.  SYMPTOMS TO REPORT IMMEDIATELY:   Following lower endoscopy (colonoscopy or flexible sigmoidoscopy):  Excessive amounts of blood in the stool  Significant tenderness or worsening of abdominal pains  Swelling of the abdomen that is new, acute  Fever of 100F or higher    For urgent or emergent issues, a gastroenterologist can be reached at any hour by calling [phone].   DIET:  We do recommend a small meal at first, but then you may proceed to your regular diet.  Drink plenty of fluids but you should avoid alcoholic beverages for 24 hours.  ACTIVITY:  You should plan to take it easy for the rest of today and you should NOT DRIVE  or use heavy machinery until tomorrow (because of the sedation medicines used during the test).    FOLLOW UP: Our staff will call the number listed on your records the next business day following your procedure to check on you and address any questions or concerns that you may have regarding the information given to you following your procedure. If we do not reach you, we will leave a message.  However, if you are feeling well and you are not experiencing any problems, there is no need to return our call.  We will assume that you have returned to your regular daily activities without incident.  If any biopsies were taken you will be contacted by phone or by letter within the next 1-3 weeks.  Please call us at [phone] if you have not heard about the biopsies in 3 weeks.    SIGNATURES/CONFIDENTIALITY: You and/or your care partner have signed paperwork which will be entered into your electronic medical record.  These signatures attest to the fact that that the information above on your After Visit Summary has been reviewed and is understood.  Full responsibility of the confidentiality of this discharge information lies with you and/or your care-partner.

## 2018-06-27 ENCOUNTER — Telehealth: Payer: Self-pay

## 2018-06-27 ENCOUNTER — Other Ambulatory Visit: Payer: Self-pay | Admitting: *Deleted

## 2018-06-27 ENCOUNTER — Telehealth: Payer: Self-pay | Admitting: Endocrinology

## 2018-06-27 DIAGNOSIS — I10 Essential (primary) hypertension: Secondary | ICD-10-CM

## 2018-06-27 MED ORDER — LOSARTAN POTASSIUM 50 MG PO TABS: 50.0000 mg | ORAL_TABLET | Freq: Every day | ORAL | 1 refills | Status: DC

## 2018-06-27 NOTE — Telephone Encounter (Signed)
Patient needs RX with refills asap for 90 supply from now on for Bromocriptine sent to [facility] in [place]. Please call Marden Noble Early at ph# [phone] to let him know when the above request has been done so that he can go pick up RX. Patient is out of the medication listed herein.

## 2018-06-27 NOTE — Telephone Encounter (Signed)
Please refill x 1 Ov is due  

## 2018-06-27 NOTE — Telephone Encounter (Signed)
Pt's brother calling back states his brother is doing fine today had a BM this morning.

## 2018-06-27 NOTE — Telephone Encounter (Signed)
Last seen 07/2017 please advise if ok to send refill

## 2018-06-27 NOTE — Telephone Encounter (Signed)
  Follow up Call-  Call back number 06/26/2018 02/17/2016  Post procedure Call Back phone  # [phone]-  Epifanio Lesches - friend [phone]  Permission to leave phone message No Yes  Some recent data might be hidden     Patient questions:  Do you have a fever, pain , or abdominal swelling? No. Pain Score  0 *  Have you tolerated food without any problems? Yes.    Have you been able to return to your normal activities? Yes.    Do you have any questions about your discharge instructions: Diet   No. Medications  No. Follow up visit  No.  Do you have questions or concerns about your Care? No.  Actions: * If pain score is 4 or above: No action needed, pain <4.

## 2018-06-27 NOTE — Telephone Encounter (Signed)
Left message

## 2018-06-28 ENCOUNTER — Other Ambulatory Visit: Payer: Self-pay

## 2018-06-28 MED ORDER — BROMOCRIPTINE MESYLATE 2.5 MG PO TABS: 2.5000 mg | ORAL_TABLET | Freq: Two times a day (BID) | ORAL | 0 refills | Status: DC

## 2018-06-28 NOTE — Telephone Encounter (Signed)
I called & spoke with patient's brother. I have sent in 90 day prescription, but I stated that patient needed f/u to continue to get medication. His brother stated that he would have to self pay & he didn't have the money right now to come for a visit. He scheduled him for the end of October before next prescription runs out.

## 2018-07-07 ENCOUNTER — Other Ambulatory Visit: Payer: Self-pay

## 2018-07-07 MED ORDER — MESALAMINE 1.2 G PO TBEC: 2.4000 g | DELAYED_RELEASE_TABLET | Freq: Every day | ORAL | 1 refills | Status: DC

## 2018-07-19 ENCOUNTER — Other Ambulatory Visit: Payer: Self-pay | Admitting: Family Medicine

## 2018-07-19 DIAGNOSIS — I10 Essential (primary) hypertension: Secondary | ICD-10-CM

## 2018-07-19 DIAGNOSIS — E785 Hyperlipidemia, unspecified: Secondary | ICD-10-CM

## 2018-08-03 ENCOUNTER — Inpatient Hospital Stay (HOSPITAL_COMMUNITY)
Admission: EM | Admit: 2018-08-03 | Discharge: 2018-08-06 | DRG: 493 | Disposition: A | Discharge status: Home Health | Payer: Self-pay | Attending: Orthopedic Surgery | Admitting: Orthopedic Surgery

## 2018-08-03 ENCOUNTER — Other Ambulatory Visit: Payer: Self-pay

## 2018-08-03 ENCOUNTER — Emergency Department (HOSPITAL_COMMUNITY): Payer: Self-pay

## 2018-08-03 ENCOUNTER — Encounter (HOSPITAL_COMMUNITY): Payer: Self-pay | Admitting: Emergency Medicine

## 2018-08-03 DIAGNOSIS — M25062 Hemarthrosis, left knee: Secondary | ICD-10-CM | POA: Diagnosis present

## 2018-08-03 DIAGNOSIS — Z87442 Personal history of urinary calculi: Secondary | ICD-10-CM

## 2018-08-03 DIAGNOSIS — I10 Essential (primary) hypertension: Secondary | ICD-10-CM | POA: Diagnosis present

## 2018-08-03 DIAGNOSIS — Y92018 Other place in single-family (private) house as the place of occurrence of the external cause: Secondary | ICD-10-CM

## 2018-08-03 DIAGNOSIS — T148XXA Other injury of unspecified body region, initial encounter: Secondary | ICD-10-CM

## 2018-08-03 DIAGNOSIS — W109XXA Fall (on) (from) unspecified stairs and steps, initial encounter: Secondary | ICD-10-CM | POA: Diagnosis present

## 2018-08-03 DIAGNOSIS — D352 Benign neoplasm of pituitary gland: Secondary | ICD-10-CM

## 2018-08-03 DIAGNOSIS — Z800 Family history of malignant neoplasm of digestive organs: Secondary | ICD-10-CM

## 2018-08-03 DIAGNOSIS — K449 Diaphragmatic hernia without obstruction or gangrene: Secondary | ICD-10-CM | POA: Diagnosis present

## 2018-08-03 DIAGNOSIS — Z419 Encounter for procedure for purposes other than remedying health state, unspecified: Secondary | ICD-10-CM

## 2018-08-03 DIAGNOSIS — I252 Old myocardial infarction: Secondary | ICD-10-CM

## 2018-08-03 DIAGNOSIS — Z8249 Family history of ischemic heart disease and other diseases of the circulatory system: Secondary | ICD-10-CM

## 2018-08-03 DIAGNOSIS — Z7982 Long term (current) use of aspirin: Secondary | ICD-10-CM

## 2018-08-03 DIAGNOSIS — I4510 Unspecified right bundle-branch block: Secondary | ICD-10-CM | POA: Diagnosis present

## 2018-08-03 DIAGNOSIS — I2510 Atherosclerotic heart disease of native coronary artery without angina pectoris: Secondary | ICD-10-CM | POA: Diagnosis present

## 2018-08-03 DIAGNOSIS — R0682 Tachypnea, not elsewhere classified: Secondary | ICD-10-CM

## 2018-08-03 DIAGNOSIS — E559 Vitamin D deficiency, unspecified: Secondary | ICD-10-CM | POA: Diagnosis present

## 2018-08-03 DIAGNOSIS — Z823 Family history of stroke: Secondary | ICD-10-CM

## 2018-08-03 DIAGNOSIS — Z808 Family history of malignant neoplasm of other organs or systems: Secondary | ICD-10-CM

## 2018-08-03 DIAGNOSIS — S82142A Displaced bicondylar fracture of left tibia, initial encounter for closed fracture: Principal | ICD-10-CM | POA: Diagnosis present

## 2018-08-03 DIAGNOSIS — M1990 Unspecified osteoarthritis, unspecified site: Secondary | ICD-10-CM | POA: Diagnosis present

## 2018-08-03 DIAGNOSIS — J45909 Unspecified asthma, uncomplicated: Secondary | ICD-10-CM | POA: Diagnosis present

## 2018-08-03 DIAGNOSIS — S82102A Unspecified fracture of upper end of left tibia, initial encounter for closed fracture: Secondary | ICD-10-CM

## 2018-08-03 DIAGNOSIS — Z6836 Body mass index (BMI) 36.0-36.9, adult: Secondary | ICD-10-CM

## 2018-08-03 DIAGNOSIS — E669 Obesity, unspecified: Secondary | ICD-10-CM | POA: Diagnosis present

## 2018-08-03 DIAGNOSIS — D62 Acute posthemorrhagic anemia: Secondary | ICD-10-CM | POA: Diagnosis not present

## 2018-08-03 DIAGNOSIS — M898X9 Other specified disorders of bone, unspecified site: Secondary | ICD-10-CM | POA: Diagnosis present

## 2018-08-03 DIAGNOSIS — Z23 Encounter for immunization: Secondary | ICD-10-CM

## 2018-08-03 DIAGNOSIS — R339 Retention of urine, unspecified: Secondary | ICD-10-CM | POA: Diagnosis present

## 2018-08-03 DIAGNOSIS — Z8371 Family history of colonic polyps: Secondary | ICD-10-CM

## 2018-08-03 DIAGNOSIS — K219 Gastro-esophageal reflux disease without esophagitis: Secondary | ICD-10-CM | POA: Diagnosis present

## 2018-08-03 DIAGNOSIS — E785 Hyperlipidemia, unspecified: Secondary | ICD-10-CM | POA: Diagnosis present

## 2018-08-03 LAB — COMPREHENSIVE METABOLIC PANEL
ALK PHOS: 59 U/L (ref 38–126)
ALT: 80 U/L — AB (ref 0–44)
AST: 96 U/L — AB (ref 15–41)
Albumin: 3.9 g/dL (ref 3.5–5.0)
Anion gap: 11 (ref 5–15)
BUN: 15 mg/dL (ref 6–20)
CALCIUM: 9.1 mg/dL (ref 8.9–10.3)
CHLORIDE: 106 mmol/L (ref 98–111)
CO2: 23 mmol/L (ref 22–32)
Creatinine, Ser: 0.9 mg/dL (ref 0.61–1.24)
GFR calc Af Amer: >60 mL/min (ref >60)
GFR calc non Af Amer: >60 mL/min (ref >60)
Glucose, Bld: 100 mg/dL — ABNORMAL HIGH (ref 70–99)
Potassium: 4.1 mmol/L (ref 3.5–5.1)
SODIUM: 140 mmol/L (ref 135–145)
Total Bilirubin: 0.6 mg/dL (ref 0.3–1.2)
Total Protein: 6.5 g/dL (ref 6.5–8.1)

## 2018-08-03 LAB — CBC WITH DIFFERENTIAL/PLATELET
BASOS ABS: 0 10*3/uL (ref 0.0–0.1)
Basophils Relative: 1 %
Eosinophils Absolute: 0.2 10*3/uL (ref 0.0–0.7)
Eosinophils Relative: 2 %
HCT: 43 % (ref 39.0–52.0)
HEMOGLOBIN: 14.5 g/dL (ref 13.0–17.0)
LYMPHS PCT: 23 %
Lymphs Abs: 1.9 10*3/uL (ref 0.7–4.0)
MCH: 32.4 pg (ref 26.0–34.0)
MCHC: 33.7 g/dL (ref 30.0–36.0)
MCV: 96 fL (ref 78.0–100.0)
Monocytes Absolute: 0.6 10*3/uL (ref 0.1–1.0)
Monocytes Relative: 8 %
NEUTROS PCT: 66 %
Neutro Abs: 5.5 10*3/uL (ref 1.7–7.7)
Platelets: 172 10*3/uL (ref 150–400)
RBC: 4.48 MIL/uL (ref 4.22–5.81)
RDW: 13.7 % (ref 11.5–15.5)
WBC: 8.2 10*3/uL (ref 4.0–10.5)

## 2018-08-03 MED ORDER — OXYCODONE-ACETAMINOPHEN 5-325 MG PO TABS: 1.0000 | ORAL_TABLET | Freq: Four times a day (QID) | ORAL | 0 refills | Status: DC | PRN

## 2018-08-03 MED ORDER — ALBUTEROL SULFATE (2.5 MG/3ML) 0.083% IN NEBU
3.0000 mL | INHALATION_SOLUTION | RESPIRATORY_TRACT | Status: DC | PRN
  Filled 2018-08-03: qty 3

## 2018-08-03 MED ORDER — HYDROCODONE-ACETAMINOPHEN 7.5-325 MG PO TABS
1.0000 | ORAL_TABLET | Freq: Four times a day (QID) | ORAL | Status: DC
  Administered 2018-08-04: 1 via ORAL
  Filled 2018-08-03: qty 1

## 2018-08-03 MED ORDER — METHOCARBAMOL 1000 MG/10ML IJ SOLN
500.0000 mg | Freq: Three times a day (TID) | INTRAVENOUS | Status: DC | PRN
  Filled 2018-08-03: qty 5

## 2018-08-03 MED ORDER — ONDANSETRON HCL 4 MG PO TABS: 4.0000 mg | ORAL_TABLET | Freq: Four times a day (QID) | ORAL | 0 refills | Status: DC

## 2018-08-03 MED ORDER — MORPHINE SULFATE (PF) 2 MG/ML IV SOLN
1.0000 mg | INTRAVENOUS | Status: DC | PRN
  Administered 2018-08-04: 1 mg via INTRAVENOUS
  Filled 2018-08-03: qty 1

## 2018-08-03 MED ORDER — METOPROLOL TARTRATE 25 MG PO TABS
12.5000 mg | ORAL_TABLET | Freq: Two times a day (BID) | ORAL | Status: DC
  Administered 2018-08-03 – 2018-08-04 (×2): 12.5 mg via ORAL
  Filled 2018-08-03 (×2): qty 1

## 2018-08-03 MED ORDER — LACTATED RINGERS IV SOLN
INTRAVENOUS | Status: DC
  Administered 2018-08-03: 23:00:00 via INTRAVENOUS

## 2018-08-03 MED ORDER — IBUPROFEN 800 MG PO TABS
800.0000 mg | ORAL_TABLET | Freq: Once | ORAL | Status: AC
  Administered 2018-08-03: 800 mg via ORAL
  Filled 2018-08-03: qty 1

## 2018-08-03 MED ORDER — OXYCODONE-ACETAMINOPHEN 5-325 MG PO TABS
2.0000 | ORAL_TABLET | Freq: Once | ORAL | Status: AC
  Administered 2018-08-03: 2 via ORAL
  Filled 2018-08-03: qty 2

## 2018-08-03 NOTE — ED Triage Notes (Signed)
Pt tripped and fell onto L knee hitting wood wheelchair ramp. Pt c/o pain and swelling, difficulty bending knee.

## 2018-08-03 NOTE — Discharge Instructions (Addendum)
Orthopaedic Trauma Service Discharge Instructions   General Discharge Instructions  WEIGHT BEARING STATUS: Nonweightbearing left leg  RANGE OF MOTION/ACTIVITY: Unrestricted range of motion right knee and right ankle, do not leave pillow under bend of the knee while at rest.  Place pillow under the ankle to help keep knee in full extension when not working on range of motion.  Please continue to do exercises that were taught to you by therapy in the hospital.  Do them at least 2 times a day.  Wound Care: Daily wound care starting on 08/08/2018.  Please see instructions below.  Continue to use white compression sock to help with swelling control.  Discharge Wound Care Instructions  Do NOT apply any ointments, solutions or lotions to pin sites or surgical wounds.  These prevent needed drainage and even though solutions like hydrogen peroxide kill bacteria, they also damage cells lining the pin sites that help fight infection.  Applying lotions or ointments can keep the wounds moist and can cause them to breakdown and open up as well. This can increase the risk for infection. When in doubt call the office.  Surgical incisions should be dressed daily.  If any drainage is noted, use one layer of adaptic, then gauze, Kerlix, and an ace wrap.  Once the incision is completely dry and without drainage, it may be left open to air out.  Showering may begin 36-48 hours later.  Cleaning gently with soap and water.  Traumatic wounds should be dressed daily as well.    One layer of adaptic, gauze, Kerlix, then ace wrap.  The adaptic can be discontinued once the draining has ceased    If you have a wet to dry dressing: wet the gauze with saline the squeeze as much saline out so the gauze is moist (not soaking wet), place moistened gauze over wound, then place a dry gauze over the moist one, followed by Kerlix wrap, then ace wrap.   DVT/PE prophylaxis: Lovenox 40 mg subcutaneous injection daily x4 weeks.  (1  injection daily)  Diet: as you were eating previously.  Can use over the counter stool softeners and bowel preparations, such as Miralax, to help with bowel movements.  Narcotics can be constipating.  Be sure to drink plenty of fluids  PAIN MEDICATION USE AND EXPECTATIONS  You have likely been given narcotic medications to help control your pain.  After a traumatic event that results in an fracture (broken bone) with or without surgery, it is ok to use narcotic pain medications to help control one's pain.  We understand that everyone responds to pain differently and each individual patient will be evaluated on a regular basis for the continued need for narcotic medications. Ideally, narcotic medication use should last no more than 6-8 weeks (coinciding with fracture healing).   As a patient it is your responsibility as well to monitor narcotic medication use and report the amount and frequency you use these medications when you come to your office visit.   We would also advise that if you are using narcotic medications, you should take a dose prior to therapy to maximize you participation.  IF YOU ARE ON NARCOTIC MEDICATIONS IT IS NOT PERMISSIBLE TO OPERATE A MOTOR VEHICLE (MOTORCYCLE/CAR/TRUCK/MOPED) OR HEAVY MACHINERY DO NOT MIX NARCOTICS WITH OTHER CNS (CENTRAL NERVOUS SYSTEM) DEPRESSANTS SUCH AS ALCOHOL   STOP SMOKING OR USING NICOTINE PRODUCTS!!!!  As discussed nicotine severely impairs your body's ability to heal surgical and traumatic wounds but also impairs bone healing.  Wounds and bone heal by forming microscopic blood vessels (angiogenesis) and nicotine is a vasoconstrictor (essentially, shrinks blood vessels).  Therefore, if vasoconstriction occurs to these microscopic blood vessels they essentially disappear and are unable to deliver necessary nutrients to the healing tissue.  This is one modifiable factor that you can do to dramatically increase your chances of healing your injury.     (This means no smoking, no nicotine gum, patches, etc)  DO NOT USE NONSTEROIDAL ANTI-INFLAMMATORY DRUGS (NSAID'S)  Using products such as Advil (ibuprofen), Aleve (naproxen), Motrin (ibuprofen) for additional pain control during fracture healing can delay and/or prevent the healing response.  If you would like to take over the counter (OTC) medication, Tylenol (acetaminophen) is ok.  However, some narcotic medications that are given for pain control contain acetaminophen as well. Therefore, you should not exceed more than 4000 mg of tylenol in a day if you do not have liver disease.  Also note that there are may OTC medicines, such as cold medicines and allergy medicines that my contain tylenol as well.  If you have any questions about medications and/or interactions please ask your doctor/PA or your pharmacist.      ICE AND ELEVATE INJURED/OPERATIVE EXTREMITY  Using ice and elevating the injured extremity above your heart can help with swelling and pain control.  Icing in a pulsatile fashion, such as 20 minutes on and 20 minutes off, can be followed.    Do not place ice directly on skin. Make sure there is a barrier between to skin and the ice pack.    Using frozen items such as frozen peas works well as the conform nicely to the are that needs to be iced.  USE AN ACE WRAP OR TED HOSE FOR SWELLING CONTROL  In addition to icing and elevation, Ace wraps or TED hose are used to help limit and resolve swelling.  It is recommended to use Ace wraps or TED hose until you are informed to stop.    When using Ace Wraps start the wrapping distally (farthest away from the body) and wrap proximally (closer to the body)   Example: If you had surgery on your leg or thing and you do not have a splint on, start the ace wrap at the toes and work your way up to the thigh        If you had surgery on your upper extremity and do not have a splint on, start the ace wrap at your fingers and work your way up to the upper  arm  IF YOU ARE IN A SPLINT OR CAST DO NOT [place]   If your splint gets wet for any reason please contact the office immediately. You may shower in your splint or cast as long as you keep it dry.  This can be done by wrapping in a cast cover or garbage back (or similar)  Do Not stick any thing down your splint or cast such as pencils, money, or hangers to try and scratch yourself with.  If you feel itchy take benadryl as prescribed on the bottle for itching  IF YOU ARE IN A CAM BOOT (BLACK BOOT)  You may remove boot periodically. Perform daily dressing changes as noted below.  Wash the liner of the boot regularly and wear a sock when wearing the boot. It is recommended that you sleep in the boot until told otherwise  CALL THE OFFICE WITH ANY QUESTIONS OR CONCERNS: [phone]

## 2018-08-03 NOTE — ED Provider Notes (Addendum)
[facility] DEPT Provider Note  CSN: [id number] Arrival date & time: 08/03/18  1158    History   Chief Complaint Chief Complaint  Patient presents with  . Fall    HPI Michael Pearson is a 57 y.o. male with a medical history of asthma, CAD, MI and HTN who presented to the ED following a fall. Patient states he accidentally tripped while he was walking into the house and states he turned to talk to a neighbor and when he turned his foot mis-hit the step. He reports falling forward onto his left knee which is currently bothering him. Denies any other injuries. Denies hearing or feeling any pops, snaps or breaks. Denies any preceding symptoms such as vision changes, headache, dizziness/lightheadedness, syncope, chest pain, SOB, paresthesias or weakness. Not ambulatory at the scene and was transported by EMS.  The history is provided by the patient.  Knee Pain   This is a new problem. The current episode started 1 to 2 hours ago. The problem occurs constantly. The problem has not changed since onset.The pain is present in the left knee. The quality of the pain is described as aching and sharp. The pain is at a severity of 9/10. The pain is severe. Associated symptoms include limited range of motion. Pertinent negatives include no numbness, no stiffness and no tingling. Exacerbated by: palpation and movement. He has tried nothing for the symptoms. History of extremity trauma: Patient fell onto a wooden wheelchair ramp prior to arrival.   Past Medical History:  Diagnosis Date  . Allergy   . Arthritis   . Asthma   . CAD (coronary artery disease)    NSTEMI 7/12:  Cardiac cath on 7/17: pLAD occluded, Dx 30-40%, pRCA 30-40%, mRCA 30-40%.  Proximal LAD was treated with a BMS.  Echo 7/19:  EF 60-65%, mild LVH.    ETT-Myoview 6/14:  Inf thinning, no ischemia, EF 64%, normal study  . Cognitive communication deficit   . Colitis   . Diverticulosis   . Duodenitis    May 2012  with heme positive stools at this time. Endorses only rare streaking of blood now.  Marland Kitchen GERD (gastroesophageal reflux disease)   . Heart murmur    as a child per the pt  . Hiatal hernia   . Hx of hemorrhoids   . Hyperlipidemia   . Hypertension   . Kidney stones   . Microadenoma ([place])    MRI in 2008 demonstrating 4.6 mm area of pituitary gland   . Migraines    Has been evaluated multiple times in past for chronic headache in which pt has had occasional nose bleeds and bloodshot eyes. This lasted for 4-5 months. CT of the head was negative in May 2012.  . Myocardial infarction ([place]) 05/17/2011  . Pituitary tumor   . RBBB (right bundle branch block)    Noted on EKG in 2008  . Ulcer     Patient Active Problem List   Diagnosis Date Noted  . Preventative health care 11/24/2017  . Hyperprolactinemia ([place]) 07/28/2017  . Pituitary adenoma ([place]) 07/28/2017  . Chest pain 05/19/2016  . Laceration of right index finger w/o foreign body w/o damage to nail 01/14/2016  . Visit for suture removal 01/09/2016  . GERD (gastroesophageal reflux disease) 12/04/2015  . Headache 08/14/2015  . History of diverticulitis of colon 03/30/2013  . Acute diverticulitis 02/03/2013  . Essential hypertension 02/03/2013  . Chronic allergic rhinitis 11/03/2012  . Heme positive stool 11/03/2012  .  Foot injury 05/15/2012  . Upper airway cough syndrome p batter acid exp vs asthma  07/22/2011  . Migraines 06/17/2011  . Mild intermittent asthma 06/17/2011  . Non-ST elevation myocardial infarction (NSTEMI) 06/07/2011  . Coronary atherosclerosis of native coronary artery 06/07/2011  . Hyperlipidemia LDL goal <100 06/07/2011    Past Surgical History:  Procedure Laterality Date  . COLONOSCOPY    . egd  03/05/2011   Dr. Benson Norway  . FLEXIBLE SIGMOIDOSCOPY  03/05/2011   Dr. Benson Norway  . HAMMER TOE SURGERY    . Heart stent    . HERNIA REPAIR     In 20's  . SIGMOIDOSCOPY    . TOOTH EXTRACTION    . UPPER GASTROINTESTINAL  ENDOSCOPY          Home Medications    Prior to Admission medications   Medication Sig Start Date End Date Taking? Authorizing Provider  albuterol (PROVENTIL HFA;VENTOLIN HFA) 108 (90 Base) MCG/ACT inhaler Inhale 2 puffs into the lungs every 4 (four) hours as needed for wheezing or shortness of breath. 11/24/17 08/12/22  [name], DO  aspirin 81 MG tablet Take 81 mg by mouth daily.      [provider]  atorvastatin (LIPITOR) 40 MG tablet Take 1 tablet (40 mg total) by mouth daily. 11/24/17   [name], DO  benzonatate (TESSALON) 100 MG capsule Take 1 capsule (100 mg total) by mouth every 8 (eight) hours. 01/21/18   Pisciotta, Elmyra Ricks, PA-C  bromocriptine (PARLODEL) 2.5 MG tablet Take 1 tablet (2.5 mg total) by mouth 2 (two) times daily. 06/28/18   Renato Shin, MD  cetirizine (ZYRTEC) 10 MG tablet Take 10 mg by mouth daily.    [provider]  ezetimibe (ZETIA) 10 MG tablet TAKE ONE TABLET BY MOUTH ONE TIME DAILY  07/20/18   Carollee Herter, Alferd Apa, DO  losartan (COZAAR) 50 MG tablet Take 1 tablet (50 mg total) by mouth daily. 06/27/18   [name], DO  mesalamine (LIALDA) 1.2 g EC tablet Take 2 tablets (2.4 g total) by mouth daily with breakfast. 07/07/18   Pyrtle, Lajuan Lines, MD  metoprolol tartrate (LOPRESSOR) 25 MG tablet TAKE HALF TABLET BY MOUTH TWICE DAILY  07/20/18   Carollee Herter, Yvonne R, DO  mometasone (NASONEX) 50 MCG/ACT nasal spray Place 2 sprays into the nose daily. 05/18/16 04/19/21  [name], DO  omeprazole (PRILOSEC) 40 MG capsule Take 1 capsule (40 mg total) by mouth daily. 30 minutes before breakfast 01/21/16   Pyrtle, Lajuan Lines, MD  ondansetron (ZOFRAN) 4 MG tablet Take 1 tablet (4 mg total) by mouth every 6 (six) hours for 5 days. 08/03/18 08/08/18  Mortis, Alvie Heidelberg I, PA-C  oxyCODONE-acetaminophen (PERCOCET/ROXICET) 5-325 MG tablet Take 1-2 tablets by mouth every 6 (six) hours as needed for up to 5 days for severe pain. 08/03/18  08/08/18  Mortis, Jonelle Sports, PA-C    Family History Family History  Problem Relation Age of Onset  . Stroke Mother   . Heart attack Father 34       MI  . Skin cancer Father   . Cancer Father        ? type "rare"  . Obesity Brother   . Coronary artery disease Brother        Possible, pt not sure of specifics  . Hypertension Brother   . Colon polyps Maternal Aunt   . Colon cancer Maternal Aunt   . Colon polyps Paternal  Grandmother   . Other Neg Hx   . Esophageal cancer Neg Hx   . Rectal cancer Neg Hx   . Stomach cancer Neg Hx     Social History Social History   Tobacco Use  . Smoking status: Never Smoker  . Smokeless tobacco: Never Used  Substance Use Topics  . Alcohol use: No  . Drug use: No     Allergies   Patient has no known allergies.   Review of Systems Review of Systems  Constitutional: Negative for chills and fever.  Eyes: Negative for visual disturbance.  Respiratory: Negative.   Cardiovascular: Negative.   Musculoskeletal: Positive for arthralgias, gait problem and joint swelling. Negative for stiffness.  Skin: Negative for wound.  Neurological: Negative for dizziness, tingling, weakness, light-headedness and numbness.  Hematological: Does not bruise/bleed easily.   Physical Exam Updated Vital Signs BP 139/78 (BP Location: Right Arm)   Pulse 83   Temp 98 F (36.7 C) (Oral)   Resp 20   SpO2 96%   Physical Exam  Constitutional: Vital signs are normal. He is cooperative.  Obese  Cardiovascular:  Pulses:      Dorsalis pedis pulses are 2+ on the right side, and 2+ on the left side.       Posterior tibial pulses are 2+ on the right side, and 2+ on the left side.  Musculoskeletal:       Right knee: Normal.       Left knee: He exhibits decreased range of motion, swelling and bony tenderness. Tenderness found.       Right ankle: Normal.       Left ankle: Normal.  Decreased flexion and bony tenderness of left knee. No ACL/MCL/LCL/PCL laxity when  tested in left knee. Unable to bear weight and ambulate without significant pain. Remaining lower extremities joints bilaterally were tested and had normal ROM with 5/5 strength.  Neurological: He is alert. He has normal strength. He displays no atrophy. No sensory deficit. He exhibits normal muscle tone.  Nursing note and vitals reviewed.  ED Treatments / Results  Labs (all labs ordered are listed, but only abnormal results are displayed) Labs Reviewed - No data to display  EKG None  Radiology Dg Knee Complete 4 Views Left  Result Date: 08/03/2018 CLINICAL DATA:  Acute LEFT knee pain following fall today. Initial encounter. EXAM: LEFT KNEE - COMPLETE 4+ VIEW COMPARISON:  None. FINDINGS: A mildly comminuted proximal tibial fracture is noted from the interspinous region inferiorly 10 cm to the MEDIAL cortex. 2 mm LATERAL displacement at the MEDIAL cortex fracture noted. Small lipohemarthrosis identified. No dislocation. IMPRESSION: Mildly comminuted intra-articular proximal tibial fracture extending inferiorly from the articular surface 10 cm. Electronically Signed   By: Margarette Canada M.D.   On: 08/03/2018 13:24    Procedures Procedures (including critical care time)  Medications Ordered in ED Medications  ibuprofen (ADVIL,MOTRIN) tablet 800 mg (800 mg Oral Given 08/03/18 1244)  oxyCODONE-acetaminophen (PERCOCET/ROXICET) 5-325 MG per tablet 2 tablet (2 tablets Oral Given 08/03/18 1356)     Initial Impression / Assessment and Plan / ED Course  Triage vital signs and the nursing notes have been reviewed.  Pertinent labs & imaging results that were available during care of the patient were reviewed and considered in medical decision making (see chart for details).  Patient presents after a mechanical fall where he fell on his left knee. Patient has been unable to bear weight or ambulate since the accident. His left lower extremity compartment  is soft and neurovascular function is intact  which is reassuring. However, he has bony tenderness along the proximal tibia with associated swelling. Will order imaging to evaluate for bony injury.  Clinical Course as of Aug 03 1541  Thu Aug 03, 2018  1332 Mildly comminuted intra-articular proximal tibial fracture extending inferiorly from the articular surface 10 cm. Consult placed with ortho to discuss    [GM]  1422 Case discussed with ortho, Dr. Berenice Primas. Recommendation is to get CT scan. Dr. Berenice Primas will discuss case with trauma regarding repair and get in contact with this provider following CT results.   [GM]  1532 Case discussed with ortho. Recommend to place patient in ACE wrap from foot to above the knee and put in knee immobilizer. Basic labs ordered. Patient will be transferred to [facility] and admitted to the ortho trauma surg service. Planned surgery for tomorrow 08/04/18.   [GM]    Clinical Course User Index [GM] Mortis, Jonelle Sports, PA-C    Final Clinical Impressions(s) / ED Diagnoses  1. Proximal Tibia Fracture. Case discussed with ortho (Dr. Berenice Primas) given the injury has intra-articular components. Ortho brought ortho trauma in on the case who will take care of this patient and planned surgery for tomorrow 08/04/18.  Dispo: Admit. Ortho trauma service will be assuming care of this patient.  Final diagnoses:  Closed fracture of proximal end of left tibia, unspecified fracture morphology, initial encounter    ED Discharge Orders         Ordered    oxyCODONE-acetaminophen (PERCOCET/ROXICET) 5-325 MG tablet  Every 6 hours PRN     08/03/18 1249    ondansetron (ZOFRAN) 4 MG tablet  Every 6 hours     08/03/18 1249            Mortis, [place] I, PA-C 08/03/18 1447    Mortis, Loyal I, PA-C 08/03/18 1545    Mesner, Corene Cornea, MD 08/04/18 1700

## 2018-08-03 NOTE — ED Notes (Signed)
Bed: WA04 Expected date:  Expected time:  Means of arrival:  Comments: Triage 5 

## 2018-08-03 NOTE — ED Notes (Signed)
3 gold top blood specimens sent to lab

## 2018-08-03 NOTE — ED Notes (Signed)
Pt's friend, Marden Noble: [phone]

## 2018-08-03 NOTE — ED Notes (Signed)
Pt brother Michael Pearson who is only next of kin request call if any change in condition or care plan at [phone]

## 2018-08-03 NOTE — Progress Notes (Signed)
Patient has his medicines from home and need to be locked up or sent home

## 2018-08-04 ENCOUNTER — Inpatient Hospital Stay (HOSPITAL_COMMUNITY): Payer: Self-pay

## 2018-08-04 ENCOUNTER — Inpatient Hospital Stay (HOSPITAL_COMMUNITY): Admission: RE | Admit: 2018-08-04 | Payer: Self-pay | Source: Ambulatory Visit | Admitting: Orthopedic Surgery

## 2018-08-04 ENCOUNTER — Inpatient Hospital Stay (HOSPITAL_COMMUNITY): Payer: Self-pay | Admitting: Certified Registered"

## 2018-08-04 ENCOUNTER — Encounter (HOSPITAL_COMMUNITY): Payer: Self-pay

## 2018-08-04 ENCOUNTER — Encounter (HOSPITAL_COMMUNITY)
Admission: EM | Disposition: A | Discharge status: Home Health | Payer: Self-pay | Source: Home / Self Care | Attending: Orthopedic Surgery

## 2018-08-04 DIAGNOSIS — S82209A Unspecified fracture of shaft of unspecified tibia, initial encounter for closed fracture: Secondary | ICD-10-CM | POA: Insufficient documentation

## 2018-08-04 DIAGNOSIS — S82142A Displaced bicondylar fracture of left tibia, initial encounter for closed fracture: Secondary | ICD-10-CM | POA: Diagnosis present

## 2018-08-04 HISTORY — PX: ORIF TIBIA PLATEAU: SHX2132

## 2018-08-04 LAB — HIV ANTIBODY (ROUTINE TESTING W REFLEX): HIV Screen 4th Generation wRfx: NONREACTIVE

## 2018-08-04 SURGERY — OPEN REDUCTION INTERNAL FIXATION (ORIF) TIBIAL PLATEAU
Anesthesia: General | Site: Leg Lower | Laterality: Left

## 2018-08-04 MED ORDER — DEXAMETHASONE SODIUM PHOSPHATE 10 MG/ML IJ SOLN
INTRAMUSCULAR | Status: DC | PRN
  Administered 2018-08-04: 5 mg via INTRAVENOUS

## 2018-08-04 MED ORDER — PROMETHAZINE HCL 25 MG/ML IJ SOLN
INTRAMUSCULAR | Status: AC
  Filled 2018-08-04: qty 1

## 2018-08-04 MED ORDER — METOCLOPRAMIDE HCL 5 MG PO TABS: 5.0000 mg | ORAL_TABLET | Freq: Three times a day (TID) | ORAL | Status: DC | PRN

## 2018-08-04 MED ORDER — METOCLOPRAMIDE HCL 5 MG/ML IJ SOLN: 5.0000 mg | Freq: Three times a day (TID) | INTRAMUSCULAR | Status: DC | PRN

## 2018-08-04 MED ORDER — ONDANSETRON HCL 4 MG/2ML IJ SOLN: 4.0000 mg | Freq: Four times a day (QID) | INTRAMUSCULAR | Status: DC | PRN

## 2018-08-04 MED ORDER — CEFAZOLIN SODIUM-DEXTROSE 2-3 GM-%(50ML) IV SOLR
INTRAVENOUS | Status: DC | PRN
  Administered 2018-08-04: 3 g via INTRAVENOUS

## 2018-08-04 MED ORDER — PROPOFOL 10 MG/ML IV BOLUS
INTRAVENOUS | Status: DC | PRN
  Administered 2018-08-04: 150 mg via INTRAVENOUS

## 2018-08-04 MED ORDER — HYDROMORPHONE HCL 1 MG/ML IJ SOLN: 0.2500 mg | INTRAMUSCULAR | Status: DC | PRN

## 2018-08-04 MED ORDER — GADOBUTROL 1 MMOL/ML IV SOLN
5.0000 mL | Freq: Once | INTRAVENOUS | Status: AC | PRN
  Administered 2018-08-04: 5 mL via INTRAVENOUS

## 2018-08-04 MED ORDER — BROMOCRIPTINE MESYLATE 2.5 MG PO TABS
2.5000 mg | ORAL_TABLET | Freq: Every day | ORAL | Status: DC
  Administered 2018-08-04 – 2018-08-05 (×2): 2.5 mg via ORAL
  Filled 2018-08-04 (×3): qty 1

## 2018-08-04 MED ORDER — METHOCARBAMOL 500 MG PO TABS
500.0000 mg | ORAL_TABLET | Freq: Four times a day (QID) | ORAL | Status: DC | PRN
  Administered 2018-08-04 – 2018-08-05 (×2): 500 mg via ORAL
  Filled 2018-08-04 (×2): qty 1

## 2018-08-04 MED ORDER — HYDROCODONE-ACETAMINOPHEN 7.5-325 MG PO TABS: 1.0000 | ORAL_TABLET | ORAL | Status: DC | PRN

## 2018-08-04 MED ORDER — INFLUENZA VAC SPLIT QUAD 0.5 ML IM SUSY
0.5000 mL | PREFILLED_SYRINGE | INTRAMUSCULAR | Status: AC
  Administered 2018-08-05: 0.5 mL via INTRAMUSCULAR
  Filled 2018-08-04: qty 0.5

## 2018-08-04 MED ORDER — TAMSULOSIN HCL 0.4 MG PO CAPS
0.4000 mg | ORAL_CAPSULE | Freq: Every day | ORAL | Status: DC
  Administered 2018-08-04 – 2018-08-06 (×3): 0.4 mg via ORAL
  Filled 2018-08-04 (×3): qty 1

## 2018-08-04 MED ORDER — ONDANSETRON HCL 4 MG PO TABS: 4.0000 mg | ORAL_TABLET | Freq: Four times a day (QID) | ORAL | Status: DC | PRN

## 2018-08-04 MED ORDER — KETOROLAC TROMETHAMINE 15 MG/ML IJ SOLN
15.0000 mg | Freq: Four times a day (QID) | INTRAMUSCULAR | Status: AC
  Administered 2018-08-04 – 2018-08-05 (×4): 15 mg via INTRAVENOUS
  Filled 2018-08-04 (×4): qty 1

## 2018-08-04 MED ORDER — METHOCARBAMOL 1000 MG/10ML IJ SOLN
500.0000 mg | Freq: Four times a day (QID) | INTRAVENOUS | Status: DC | PRN
  Filled 2018-08-04: qty 5

## 2018-08-04 MED ORDER — ONDANSETRON HCL 4 MG/2ML IJ SOLN
INTRAMUSCULAR | Status: DC | PRN
  Administered 2018-08-04: 4 mg via INTRAVENOUS

## 2018-08-04 MED ORDER — PHENYLEPHRINE 40 MCG/ML (10ML) SYRINGE FOR IV PUSH (FOR BLOOD PRESSURE SUPPORT)
PREFILLED_SYRINGE | INTRAVENOUS | Status: DC | PRN
  Administered 2018-08-04: 80 ug via INTRAVENOUS

## 2018-08-04 MED ORDER — FENTANYL CITRATE (PF) 100 MCG/2ML IJ SOLN
INTRAMUSCULAR | Status: DC | PRN
  Administered 2018-08-04: 50 ug via INTRAVENOUS
  Administered 2018-08-04 (×2): 25 ug via INTRAVENOUS
  Administered 2018-08-04: 125 ug via INTRAVENOUS
  Administered 2018-08-04 (×5): 25 ug via INTRAVENOUS

## 2018-08-04 MED ORDER — OXYCODONE HCL 5 MG PO TABS
ORAL_TABLET | ORAL | Status: AC
  Filled 2018-08-04: qty 1

## 2018-08-04 MED ORDER — MEPERIDINE HCL 50 MG/ML IJ SOLN: 6.2500 mg | INTRAMUSCULAR | Status: DC | PRN

## 2018-08-04 MED ORDER — MORPHINE SULFATE (PF) 2 MG/ML IV SOLN: 0.5000 mg | INTRAVENOUS | Status: DC | PRN

## 2018-08-04 MED ORDER — POLYETHYLENE GLYCOL 3350 17 G PO PACK
17.0000 g | PACK | Freq: Every day | ORAL | Status: DC
  Administered 2018-08-04 – 2018-08-06 (×3): 17 g via ORAL
  Filled 2018-08-04 (×3): qty 1

## 2018-08-04 MED ORDER — ACETAMINOPHEN 325 MG PO TABS: 325.0000 mg | ORAL_TABLET | Freq: Four times a day (QID) | ORAL | Status: DC | PRN

## 2018-08-04 MED ORDER — HYDROCODONE-ACETAMINOPHEN 5-325 MG PO TABS
1.0000 | ORAL_TABLET | ORAL | Status: DC | PRN
  Administered 2018-08-04: 2 via ORAL
  Filled 2018-08-04: qty 2

## 2018-08-04 MED ORDER — DOCUSATE SODIUM 100 MG PO CAPS
100.0000 mg | ORAL_CAPSULE | Freq: Two times a day (BID) | ORAL | Status: DC
  Administered 2018-08-04 – 2018-08-06 (×3): 100 mg via ORAL
  Filled 2018-08-04 (×4): qty 1

## 2018-08-04 MED ORDER — 0.9 % SODIUM CHLORIDE (POUR BTL) OPTIME
TOPICAL | Status: DC | PRN
  Administered 2018-08-04: 1000 mL

## 2018-08-04 MED ORDER — SODIUM CHLORIDE 0.9 % IV SOLN
INTRAVENOUS | Status: DC | PRN
  Administered 2018-08-04: 30 ug/min via INTRAVENOUS

## 2018-08-04 MED ORDER — DIPHENHYDRAMINE HCL 12.5 MG/5ML PO ELIX: 12.5000 mg | ORAL_SOLUTION | ORAL | Status: DC | PRN

## 2018-08-04 MED ORDER — ACETAMINOPHEN 10 MG/ML IV SOLN
INTRAVENOUS | Status: AC
  Filled 2018-08-04: qty 100

## 2018-08-04 MED ORDER — LIDOCAINE 2% (20 MG/ML) 5 ML SYRINGE
INTRAMUSCULAR | Status: DC | PRN
  Administered 2018-08-04: 100 mg via INTRAVENOUS

## 2018-08-04 MED ORDER — SUCCINYLCHOLINE CHLORIDE 20 MG/ML IJ SOLN
INTRAMUSCULAR | Status: DC | PRN
  Administered 2018-08-04: 100 mg via INTRAVENOUS

## 2018-08-04 MED ORDER — OXYCODONE HCL 5 MG/5ML PO SOLN: 5.0000 mg | Freq: Once | ORAL | Status: AC | PRN

## 2018-08-04 MED ORDER — LACTATED RINGERS IV SOLN: INTRAVENOUS | Status: DC

## 2018-08-04 MED ORDER — PANTOPRAZOLE SODIUM 40 MG PO TBEC
40.0000 mg | DELAYED_RELEASE_TABLET | Freq: Every day | ORAL | Status: DC
  Administered 2018-08-04 – 2018-08-06 (×3): 40 mg via ORAL
  Filled 2018-08-04 (×3): qty 1

## 2018-08-04 MED ORDER — OXYCODONE HCL 5 MG PO TABS
5.0000 mg | ORAL_TABLET | Freq: Once | ORAL | Status: AC | PRN
  Administered 2018-08-04: 5 mg via ORAL

## 2018-08-04 MED ORDER — ENOXAPARIN SODIUM 40 MG/0.4ML ~~LOC~~ SOLN
40.0000 mg | SUBCUTANEOUS | Status: DC
  Administered 2018-08-05 – 2018-08-06 (×2): 40 mg via SUBCUTANEOUS
  Filled 2018-08-04 (×2): qty 0.4

## 2018-08-04 MED ORDER — ACETAMINOPHEN 500 MG PO TABS
500.0000 mg | ORAL_TABLET | Freq: Three times a day (TID) | ORAL | Status: AC
  Administered 2018-08-04 – 2018-08-05 (×4): 500 mg via ORAL
  Filled 2018-08-04 (×4): qty 1

## 2018-08-04 MED ORDER — PROMETHAZINE HCL 25 MG/ML IJ SOLN
6.2500 mg | INTRAMUSCULAR | Status: DC | PRN
  Administered 2018-08-04: 12.5 mg via INTRAVENOUS

## 2018-08-04 MED ORDER — CEFAZOLIN SODIUM-DEXTROSE 2-4 GM/100ML-% IV SOLN
2.0000 g | Freq: Four times a day (QID) | INTRAVENOUS | Status: AC
  Administered 2018-08-04 – 2018-08-05 (×3): 2 g via INTRAVENOUS
  Filled 2018-08-04 (×3): qty 100

## 2018-08-04 SURGICAL SUPPLY — 95 items
BANDAGE ACE 4X5 VEL STRL LF (GAUZE/BANDAGES/DRESSINGS) ×2 IMPLANT
BANDAGE ACE 6X5 VEL STRL LF (GAUZE/BANDAGES/DRESSINGS) ×2 IMPLANT
BANDAGE ESMARK 6X9 LF (GAUZE/BANDAGES/DRESSINGS) ×1 IMPLANT
BIT DRILL 2.5X2.75 QC CALB (BIT) ×1 IMPLANT
BIT DRILL 3.3 LONG (BIT) ×1 IMPLANT
BIT DRILL 4X70 QC CALB QC (BIT) ×1 IMPLANT
BIT DRILL QC 3.3X195 (BIT) ×1 IMPLANT
BLADE CLIPPER SURG (BLADE) ×1 IMPLANT
BLADE SURG 10 STRL SS (BLADE) ×2 IMPLANT
BLADE SURG 15 STRL LF DISP TIS (BLADE) ×1 IMPLANT
BLADE SURG 15 STRL SS (BLADE) ×2
BNDG CMPR 9X6 STRL LF SNTH (GAUZE/BANDAGES/DRESSINGS) ×1
BNDG COHESIVE 4X5 TAN STRL (GAUZE/BANDAGES/DRESSINGS) ×2 IMPLANT
BNDG ESMARK 6X9 LF (GAUZE/BANDAGES/DRESSINGS) ×2
BNDG GAUZE ELAST 4 BULKY (GAUZE/BANDAGES/DRESSINGS) ×2 IMPLANT
BRUSH SCRUB SURG 4.25 DISP (MISCELLANEOUS) ×4 IMPLANT
CANISTER SUCT 3000ML PPV (MISCELLANEOUS) ×1 IMPLANT
CAP LOCK NCB (Cap) ×4 IMPLANT
CLEANER TIP ELECTROSURG 2X2 (MISCELLANEOUS) ×1 IMPLANT
COVER SURGICAL LIGHT HANDLE (MISCELLANEOUS) ×2 IMPLANT
COVER WAND RF STERILE (DRAPES) ×2 IMPLANT
CUFF TOURNIQUET SINGLE 34IN LL (TOURNIQUET CUFF) ×1 IMPLANT
DRAPE C-ARM 42X72 X-RAY (DRAPES) ×2 IMPLANT
DRAPE C-ARMOR (DRAPES) ×2 IMPLANT
DRAPE HALF SHEET 40X57 (DRAPES) IMPLANT
DRAPE INCISE IOBAN 66X45 STRL (DRAPES) ×2 IMPLANT
DRAPE U-SHAPE 47X51 STRL (DRAPES) ×2 IMPLANT
DRSG ADAPTIC 3X8 NADH LF (GAUZE/BANDAGES/DRESSINGS) ×2 IMPLANT
DRSG MEPITEL 4X7.2 (GAUZE/BANDAGES/DRESSINGS) ×1 IMPLANT
DRSG PAD ABDOMINAL 8X10 ST (GAUZE/BANDAGES/DRESSINGS) ×4 IMPLANT
ELECT REM PT RETURN 9FT ADLT (ELECTROSURGICAL) ×2
ELECTRODE REM PT RTRN 9FT ADLT (ELECTROSURGICAL) ×1 IMPLANT
GAUZE SPONGE 4X4 12PLY STRL (GAUZE/BANDAGES/DRESSINGS) ×2 IMPLANT
GLOVE BIO SURGEON STRL SZ7.5 (GLOVE) ×2 IMPLANT
GLOVE BIO SURGEON STRL SZ8 (GLOVE) ×2 IMPLANT
GLOVE BIOGEL PI IND STRL 7.5 (GLOVE) ×1 IMPLANT
GLOVE BIOGEL PI IND STRL 8 (GLOVE) ×1 IMPLANT
GLOVE BIOGEL PI INDICATOR 7.5 (GLOVE) ×1
GLOVE BIOGEL PI INDICATOR 8 (GLOVE) ×1
GOWN STRL REUS W/ TWL LRG LVL3 (GOWN DISPOSABLE) ×2 IMPLANT
GOWN STRL REUS W/ TWL XL LVL3 (GOWN DISPOSABLE) ×1 IMPLANT
GOWN STRL REUS W/TWL LRG LVL3 (GOWN DISPOSABLE) ×4
GOWN STRL REUS W/TWL XL LVL3 (GOWN DISPOSABLE) ×2
IMMOBILIZER KNEE 22 UNIV (SOFTGOODS) ×2 IMPLANT
K-WIRE 2.0 (WIRE) ×4
K-WIRE ACE 1.6X6 (WIRE) ×4
K-WIRE FXSTD 280X2XNS SS (WIRE) ×2
KIT BASIN OR (CUSTOM PROCEDURE TRAY) ×2 IMPLANT
KIT TURNOVER KIT B (KITS) ×2 IMPLANT
KWIRE ACE 1.6X6 (WIRE) IMPLANT
KWIRE FXSTD 280X2XNS SS (WIRE) IMPLANT
MANIFOLD NEPTUNE WASTE (CANNULA) ×1 IMPLANT
NDL 18GX1X1/2 (RX/OR ONLY) (NEEDLE) IMPLANT
NDL SUT 6 .5 CRC .975X.05 MAYO (NEEDLE) IMPLANT
NEEDLE 18GX1X1/2 (RX/OR ONLY) (NEEDLE) ×2 IMPLANT
NEEDLE MAYO TAPER (NEEDLE)
NS IRRIG 1000ML POUR BTL (IV SOLUTION) ×2 IMPLANT
PACK ORTHO EXTREMITY (CUSTOM PROCEDURE TRAY) ×2 IMPLANT
PAD ARMBOARD 7.5X6 YLW CONV (MISCELLANEOUS) ×4 IMPLANT
PAD CAST 4YDX4 CTTN HI CHSV (CAST SUPPLIES) ×1 IMPLANT
PADDING CAST COTTON 4X4 STRL (CAST SUPPLIES) ×2
PADDING CAST COTTON 6X4 STRL (CAST SUPPLIES) ×2 IMPLANT
PLATE LAT LOCK PA 192 9H LT (Plate) ×1 IMPLANT
PLATE LOCK COMP 5H FOOT (Plate) ×1 IMPLANT
SCREW CORT FT 32X3.5XNONLOCK (Screw) IMPLANT
SCREW CORTICAL 3.5MM  32MM (Screw) ×2 IMPLANT
SCREW CORTICAL 3.5MM 32MM (Screw) ×2 IMPLANT
SCREW CORTICAL 3.5MM 36MM (Screw) ×1 IMPLANT
SCREW NCB 4.0 28MM (Screw) ×1 IMPLANT
SCREW NCB 4.0 32MM (Screw) ×2 IMPLANT
SCREW NCB 4.0X36MM (Screw) ×1 IMPLANT
SCREW NCB 4.0X40MM (Screw) ×1 IMPLANT
SCREW NCB 4X3 4X70 (Screw) ×2 IMPLANT
SCREW NCB PA ST 4X3.4X90 (Screw) ×2 IMPLANT
SPONGE LAP 18X18 X RAY DECT (DISPOSABLE) ×2 IMPLANT
STAPLER VISISTAT 35W (STAPLE) ×2 IMPLANT
STOCKINETTE IMPERVIOUS LG (DRAPES) ×2 IMPLANT
SUCTION FRAZIER HANDLE 10FR (MISCELLANEOUS) ×1
SUCTION TUBE FRAZIER 10FR DISP (MISCELLANEOUS) ×1 IMPLANT
SUT ETHILON 2 0 PSLX (SUTURE) ×2 IMPLANT
SUT ETHILON 3 0 PS 1 (SUTURE) IMPLANT
SUT PROLENE 0 CT 2 (SUTURE) ×4 IMPLANT
SUT VIC AB 0 CT1 27 (SUTURE) ×2
SUT VIC AB 0 CT1 27XBRD ANBCTR (SUTURE) ×1 IMPLANT
SUT VIC AB 1 CT1 27 (SUTURE) ×2
SUT VIC AB 1 CT1 27XBRD ANBCTR (SUTURE) ×1 IMPLANT
SUT VIC AB 2-0 CT1 27 (SUTURE) ×6
SUT VIC AB 2-0 CT1 TAPERPNT 27 (SUTURE) ×2 IMPLANT
SYRINGE 20CC LL (MISCELLANEOUS) ×1 IMPLANT
TOWEL OR 17X24 6PK STRL BLUE (TOWEL DISPOSABLE) ×2 IMPLANT
TOWEL OR 17X26 10 PK STRL BLUE (TOWEL DISPOSABLE) ×4 IMPLANT
TRAY FOLEY MTR SLVR 16FR STAT (SET/KITS/TRAYS/PACK) ×1 IMPLANT
TUBE CONNECTING 12X1/4 (SUCTIONS) ×3 IMPLANT
WATER STERILE IRR 1000ML POUR (IV SOLUTION) ×2 IMPLANT
YANKAUER SUCT BULB TIP NO VENT (SUCTIONS) ×2 IMPLANT

## 2018-08-04 NOTE — Plan of Care (Signed)
  Problem: Coping: Goal: Level of anxiety will decrease Outcome: Progressing   Problem: Pain Managment: Goal: General experience of comfort will improve Outcome: Progressing   Problem: Safety: Goal: Ability to remain free from injury will improve Outcome: Progressing   Problem: Skin Integrity: Goal: Risk for impaired skin integrity will decrease Outcome: Progressing   

## 2018-08-04 NOTE — Anesthesia Preprocedure Evaluation (Signed)
Anesthesia Evaluation  Patient identified by MRN, date of birth, ID band Patient awake    Reviewed: Allergy & Precautions, NPO status , Patient's Chart, lab work & pertinent test results  Airway Mallampati: II  TM Distance: >3 FB Neck ROM: Full    Dental no notable dental hx.    Pulmonary neg pulmonary ROS, asthma ,    Pulmonary exam normal breath sounds clear to auscultation       Cardiovascular hypertension, Pt. on medications and Pt. on home beta blockers + CAD and + Past MI  negative cardio ROS Normal cardiovascular exam Rhythm:Regular Rate:Normal     Neuro/Psych  Headaches, negative neurological ROS  negative psych ROS   GI/Hepatic negative GI ROS, Neg liver ROS, GERD  ,  Endo/Other  negative endocrine ROS  Renal/GU negative Renal ROS  negative genitourinary   Musculoskeletal negative musculoskeletal ROS (+) Arthritis , Osteoarthritis,    Abdominal (+) + obese,   Peds negative pediatric ROS (+)  Hematology negative hematology ROS (+)   Anesthesia Other Findings   Reproductive/Obstetrics negative OB ROS                             Anesthesia Physical Anesthesia Plan  ASA: III  Anesthesia Plan: General   Post-op Pain Management:    Induction: Intravenous  PONV Risk Score and Plan: 2 and Ondansetron and Midazolam  Airway Management Planned: Oral ETT  Additional Equipment:   Intra-op Plan:   Post-operative Plan: Extubation in OR  Informed Consent: I have reviewed the patients History and Physical, chart, labs and discussed the procedure including the risks, benefits and alternatives for the proposed anesthesia with the patient or authorized representative who has indicated his/her understanding and acceptance.   Dental advisory given  Plan Discussed with: CRNA  Anesthesia Plan Comments:         Anesthesia Quick Evaluation

## 2018-08-04 NOTE — ED Notes (Signed)
ED TO INPATIENT HANDOFF REPORT  Name/Age/Gender Michael Pearson 57 y.o. male  Code Status    Code Status Orders  (From admission, onward)         Start     Ordered   08/03/18 1530  Full code  Continuous     08/03/18 1534        Code Status History    Date Active Date Inactive Code Status Order ID Comments User Context   02/03/2013 1508 02/05/2013 1637 Full Code [id number]  Theodis Blaze, MD Inpatient      Home/SNF/Other Home  Chief Complaint fall, knee injury  Level of Care/Admitting Diagnosis ED Disposition    ED Disposition Condition [place]: [place] [[id number]]  Level of Care: Med-Surg [16]  Diagnosis: Tibial fracture [633354]  Admitting Physician: [place], [place]  Attending Physician: HANDY, MICHAEL [[id number]]  Estimated length of stay: past midnight tomorrow  Certification:: I certify this patient will need inpatient services for at least 2 midnights  PT Class (Do Not Modify): Inpatient [101]  PT Acc Code (Do Not Modify): Private [1]       Medical History Past Medical History:  Diagnosis Date  . Allergy   . Arthritis   . Asthma   . CAD (coronary artery disease)    NSTEMI 7/12:  Cardiac cath on 7/17: pLAD occluded, Dx 30-40%, pRCA 30-40%, mRCA 30-40%.  Proximal LAD was treated with a BMS.  Echo 7/19:  EF 60-65%, mild LVH.    ETT-Myoview 6/14:  Inf thinning, no ischemia, EF 64%, normal study  . Cognitive communication deficit   . Colitis   . Diverticulosis   . Duodenitis    May 2012 with heme positive stools at this time. Endorses only rare streaking of blood now.  Marland Kitchen GERD (gastroesophageal reflux disease)   . Heart murmur    as a child per the pt  . Hiatal hernia   . Hx of hemorrhoids   . Hyperlipidemia   . Hypertension   . Kidney stones   . Microadenoma ([place])    MRI in 2008 demonstrating 4.6 mm area of pituitary gland   . Migraines    Has been evaluated multiple times in past for chronic headache in which  pt has had occasional nose bleeds and bloodshot eyes. This lasted for 4-5 months. CT of the head was negative in May 2012.  . Myocardial infarction ([place]) 05/17/2011  . Pituitary tumor   . RBBB (right bundle branch block)    Noted on EKG in 2008  . Ulcer     Allergies No Known Allergies  IV Location/Drains/Wounds Patient Lines/Drains/Airways Status   Active Line/Drains/Airways    Name:   Placement date:   Placement time:   Site:   Days:   Peripheral IV 08/03/18 Right Arm   08/03/18    1637    Arm   1          Labs/Imaging Results for orders placed or performed during the hospital encounter of 08/03/18 (from the past 48 hour(s))  CBC with Differential     Status: None   Collection Time: 08/03/18  4:37 PM  Result Value Ref Range   WBC 8.2 4.0 - 10.5 K/uL   RBC 4.48 4.22 - 5.81 MIL/uL   Hemoglobin 14.5 13.0 - 17.0 g/dL   HCT 43.0 39.0 - 52.0 %   MCV 96.0 78.0 - 100.0 fL   MCH 32.4 26.0 - 34.0 pg  MCHC 33.7 30.0 - 36.0 g/dL   RDW 13.7 11.5 - 15.5 %   Platelets 172 150 - 400 K/uL   Neutrophils Relative % 66 %   Neutro Abs 5.5 1.7 - 7.7 K/uL   Lymphocytes Relative 23 %   Lymphs Abs 1.9 0.7 - 4.0 K/uL   Monocytes Relative 8 %   Monocytes Absolute 0.6 0.1 - 1.0 K/uL   Eosinophils Relative 2 %   Eosinophils Absolute 0.2 0.0 - 0.7 K/uL   Basophils Relative 1 %   Basophils Absolute 0.0 0.0 - 0.1 K/uL    Comment: Performed at [facility], [place] [address] [place], [place] [address]  Comprehensive metabolic panel     Status: Abnormal   Collection Time: 08/03/18  4:37 PM  Result Value Ref Range   Sodium 140 135 - 145 mmol/L   Potassium 4.1 3.5 - 5.1 mmol/L   Chloride 106 98 - 111 mmol/L   CO2 23 22 - 32 mmol/L   Glucose, Bld 100 (H) 70 - 99 mg/dL   BUN 15 6 - 20 mg/dL   Creatinine, Ser 0.90 0.61 - 1.24 mg/dL   Calcium 9.1 8.9 - 10.3 mg/dL   Total Protein 6.5 6.5 - 8.1 g/dL   Albumin 3.9 3.5 - 5.0 g/dL   AST 96 (H) 15 - 41 U/L   ALT 80 (H) 0 - 44 U/L    Alkaline Phosphatase 59 38 - 126 U/L   Total Bilirubin 0.6 0.3 - 1.2 mg/dL   GFR calc non Af Amer >60 >60 mL/min   GFR calc Af Amer >60 >60 mL/min    Comment: (NOTE) The eGFR has been calculated using the CKD EPI equation. This calculation has not been validated in all clinical situations. eGFR's persistently <60 mL/min signify possible Chronic Kidney Disease.    Anion gap 11 5 - 15    Comment: Performed at [facility], [place] [address], [place], [place] [address]   Ct Tibia Fibula Left Wo Contrast  Result Date: 08/03/2018 CLINICAL DATA:  Left lower leg injury secondary to a trip and fall today while walking. Initial encounter. EXAM: CT OF THE LOWER LEFT EXTREMITY WITHOUT CONTRAST TECHNIQUE: Multidetector CT imaging of the lower left extremity was performed according to the standard protocol. COMPARISON:  Plain films left lower leg this same scratch the plain films left knee this same day. FINDINGS: Bones/Joint/Cartilage As seen on the comparison plain films, the patient has a nondisplaced fracture of the proximal tibia. The main component of the fracture is obliquely transverse in orientation originating in the lateral metaphysis 5 cm below the joint line and extending inferiorly through the medial metaphysis approximately 10 cm below the joint line. A longitudinal fracture line extends cephalad through the medial tibial eminence. The tibial plateaus are preserved. No other fracture is identified. Ligaments Suboptimally assessed by CT. The cruciate and collateral ligaments appear intact. Muscles and Tendons Intact and normal in appearance. Soft tissues Small lipohemarthrosis is noted. IMPRESSION: Nondisplaced fracture of the proximal metaphysis and diaphysis of the tibia includes a longitudinal component extending cephalad through the medial tibial eminence. The tibial plateaus are intact. No other fracture is identified. Electronically Signed   By: Inge Rise M.D.   On:  08/03/2018 16:05   Dg Knee Complete 4 Views Left  Result Date: 08/03/2018 CLINICAL DATA:  Acute LEFT knee pain following fall today. Initial encounter. EXAM: LEFT KNEE - COMPLETE 4+ VIEW COMPARISON:  None. FINDINGS: A mildly comminuted proximal  tibial fracture is noted from the interspinous region inferiorly 10 cm to the MEDIAL cortex. 2 mm LATERAL displacement at the MEDIAL cortex fracture noted. Small lipohemarthrosis identified. No dislocation. IMPRESSION: Mildly comminuted intra-articular proximal tibial fracture extending inferiorly from the articular surface 10 cm. Electronically Signed   By: Margarette Canada M.D.   On: 08/03/2018 13:24    Pending Labs Unresulted Labs (From admission, onward)    Start     Ordered   08/03/18 1920  HIV Antibody (routine testing w rflx)  Once,   R     08/03/18 1920          Vitals/Pain Today's Vitals   08/04/18 0530 08/04/18 0600 08/04/18 0630 08/04/18 0812  BP: (!) 150/78 135/76 135/70   Pulse: 65 63 (!) 58   Resp: '20 11 20   ' Temp:      TempSrc:      SpO2: 95% 93% (!) 89%   PainSc:    3     Isolation Precautions No active isolations  Medications Medications  HYDROcodone-acetaminophen (NORCO) 7.5-325 MG per tablet 1 tablet (1 tablet Oral Given 08/04/18 0415)  lactated ringers infusion ( Intravenous New Bag/Given 08/03/18 2307)  morphine 2 MG/ML injection 1 mg (1 mg Intravenous Given 08/04/18 0406)  methocarbamol (ROBAXIN) 500 mg in dextrose 5 % 50 mL IVPB (has no administration in time range)  albuterol (PROVENTIL) (2.5 MG/3ML) 0.083% nebulizer solution 3 mL (has no administration in time range)  metoprolol tartrate (LOPRESSOR) tablet 12.5 mg (12.5 mg Oral Given 08/03/18 2301)  ibuprofen (ADVIL,MOTRIN) tablet 800 mg (800 mg Oral Given 08/03/18 1244)  oxyCODONE-acetaminophen (PERCOCET/ROXICET) 5-325 MG per tablet 2 tablet (2 tablets Oral Given 08/03/18 1356)    Mobility manual wheelchair

## 2018-08-04 NOTE — Progress Notes (Signed)
Pt went off unit for MRI and still not back in the unit. Foley scheduled to be taken out at 1800, will endorse removal of foley to oncoming RN.

## 2018-08-04 NOTE — ED Notes (Signed)
Call  From [place] send pt via [facility] for surgery, call to brother Judie Bonus to transfer pt to [facility] brother aware that he needs to be at [facility] to sign permit for OR procedure. [facility] called for transport.

## 2018-08-04 NOTE — H&P (Signed)
[facility] H&P/Consult     Patient ID: Michael Pearson MRN: [id number] DOB/AGE: February 01, 1961 57 y.o.  Chief Complaint:  Left tibial plateau fracture HPI: Michael Pearson is an 57 y.o. male.who tripped and fell on handicap ramp at this home. 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 Did not have employment during that time. Lives with his brother now. Reports a little paresthesia in the left foot but denies loss of sensation or motor weakness. Pain moderate, aching, worse with movement, alleviated with ice, elevation. Feels he needs to urinate but unable to do so.  Past Medical History:  Diagnosis Date  . Allergy   . Arthritis   . Asthma   . CAD (coronary artery disease)    NSTEMI 7/12:  Cardiac cath on 7/17: pLAD occluded, Dx 30-40%, pRCA 30-40%, mRCA 30-40%.  Proximal LAD was treated with a BMS.  Echo 7/19:  EF 60-65%, mild LVH.    ETT-Myoview 6/14:  Inf thinning, no ischemia, EF 64%, normal study  . Cognitive communication deficit   . Colitis   . Diverticulosis   . Duodenitis    May 2012 with heme positive stools at this time. Endorses only rare streaking of blood now.  Marland Kitchen GERD (gastroesophageal reflux disease)   . Heart murmur    as a child per the pt  . Hiatal hernia   . Hx of hemorrhoids   . Hyperlipidemia   . Hypertension   . Kidney stones   . Microadenoma ([place])    MRI in 2008 demonstrating 4.6 mm area of pituitary gland   . Migraines    Has been evaluated multiple times in past for chronic headache in which pt has had occasional nose bleeds and bloodshot eyes. This lasted for 4-5 months. CT of the head was negative in May 2012.  . Myocardial infarction ([place]) 05/17/2011  . Pituitary tumor   . RBBB (right bundle branch block)    Noted on EKG in 2008  . Ulcer     Past Surgical History:  Procedure Laterality Date  . COLONOSCOPY    . egd  03/05/2011   Dr. Benson [place]  . FLEXIBLE SIGMOIDOSCOPY  03/05/2011   Dr. Benson [place]  . HAMMER  TOE SURGERY    . Heart stent    . HERNIA REPAIR     In 20's  . SIGMOIDOSCOPY    . TOOTH EXTRACTION    . UPPER GASTROINTESTINAL ENDOSCOPY      Family History  Problem Relation Age of Onset  . Stroke Mother   . Heart attack Father 50       MI  . Skin cancer Father   . Cancer Father        ? type "rare"  . Obesity Brother   . Coronary artery disease Brother        Possible, pt not sure of specifics  . Hypertension Brother   . Colon polyps Maternal Aunt   . Colon cancer Maternal Aunt   . Colon polyps Paternal Grandmother   . Other Neg Hx   . Esophageal cancer Neg Hx   . Rectal cancer Neg Hx   . Stomach cancer Neg Hx    Social History:  reports that he has never smoked. He has never used smokeless tobacco. He reports that he does not drink alcohol or use drugs.  Allergies: No Known Allergies  Medications Prior to Admission  Medication Sig Dispense Refill  . albuterol (PROVENTIL  HFA;VENTOLIN HFA) 108 (90 Base) MCG/ACT inhaler Inhale 2 puffs into the lungs every 4 (four) hours as needed for wheezing or shortness of breath. 1 Inhaler 2  . aspirin 81 MG tablet Take 81 mg by mouth daily.      Marland Kitchen atorvastatin (LIPITOR) 40 MG tablet Take 1 tablet (40 mg total) by mouth daily. 90 tablet 3  . bromocriptine (PARLODEL) 2.5 MG tablet Take 1 tablet (2.5 mg total) by mouth 2 (two) times daily. (Patient taking differently: Take 2.5 mg by mouth at bedtime. ) 180 tablet 0  . cetirizine (ZYRTEC) 10 MG tablet Take 10 mg by mouth daily as needed for allergies.     Marland Kitchen ezetimibe (ZETIA) 10 MG tablet TAKE ONE TABLET BY MOUTH ONE TIME DAILY  (Patient taking differently: Take 10 mg by mouth daily. ) 90 tablet 0  . losartan (COZAAR) 50 MG tablet Take 1 tablet (50 mg total) by mouth daily. 90 tablet 1  . metoprolol tartrate (LOPRESSOR) 25 MG tablet TAKE HALF TABLET BY MOUTH TWICE DAILY  (Patient taking differently: Take 12.5 mg by mouth 2 (two) times daily. ) 90 tablet 0  . mometasone (NASONEX) 50 MCG/ACT  nasal spray Place 2 sprays into the nose daily. (Patient taking differently: Place 2 sprays into the nose daily as needed (congestion). ) 17 g 2  . Multiple Vitamins-Minerals (MENS ONE DAILY PO) Take 1 tablet by mouth daily.    Marland Kitchen omeprazole (PRILOSEC) 40 MG capsule Take 1 capsule (40 mg total) by mouth daily. 30 minutes before breakfast (Patient taking differently: Take 40 mg by mouth at bedtime. 30 minutes before breakfast) 30 capsule 3  . benzonatate (TESSALON) 100 MG capsule Take 1 capsule (100 mg total) by mouth every 8 (eight) hours. (Patient not taking: Reported on 08/03/2018) 21 capsule 0  . mesalamine (LIALDA) 1.2 g EC tablet Take 2 tablets (2.4 g total) by mouth daily with breakfast. (Patient not taking: Reported on 08/03/2018) 180 tablet 1    Results for orders placed or performed during the hospital encounter of 08/03/18 (from the past 48 hour(s))  CBC with Differential     Status: None   Collection Time: 08/03/18  4:37 PM  Result Value Ref Range   WBC 8.2 4.0 - 10.5 K/uL   RBC 4.48 4.22 - 5.81 MIL/uL   Hemoglobin 14.5 13.0 - 17.0 g/dL   HCT 43.0 39.0 - 52.0 %   MCV 96.0 78.0 - 100.0 fL   MCH 32.4 26.0 - 34.0 pg   MCHC 33.7 30.0 - 36.0 g/dL   RDW 13.7 11.5 - 15.5 %   Platelets 172 150 - 400 K/uL   Neutrophils Relative % 66 %   Neutro Abs 5.5 1.7 - 7.7 K/uL   Lymphocytes Relative 23 %   Lymphs Abs 1.9 0.7 - 4.0 K/uL   Monocytes Relative 8 %   Monocytes Absolute 0.6 0.1 - 1.0 K/uL   Eosinophils Relative 2 %   Eosinophils Absolute 0.2 0.0 - 0.7 K/uL   Basophils Relative 1 %   Basophils Absolute 0.0 0.0 - 0.1 K/uL    Comment: Performed at [facility], [place] [address], [place], [place] [address]  Comprehensive metabolic panel     Status: Abnormal   Collection Time: 08/03/18  4:37 PM  Result Value Ref Range   Sodium 140 135 - 145 mmol/L   Potassium 4.1 3.5 - 5.1 mmol/L   Chloride 106 98 - 111 mmol/L   CO2 23 22 - 32  mmol/L   Glucose, Bld 100 (H) 70 - 99  mg/dL   BUN 15 6 - 20 mg/dL   Creatinine, Ser 0.90 0.61 - 1.24 mg/dL   Calcium 9.1 8.9 - 10.3 mg/dL   Total Protein 6.5 6.5 - 8.1 g/dL   Albumin 3.9 3.5 - 5.0 g/dL   AST 96 (H) 15 - 41 U/L   ALT 80 (H) 0 - 44 U/L   Alkaline Phosphatase 59 38 - 126 U/L   Total Bilirubin 0.6 0.3 - 1.2 mg/dL   GFR calc non Af Amer >60 >60 mL/min   GFR calc Af Amer >60 >60 mL/min    Comment: (NOTE) The eGFR has been calculated using the CKD EPI equation. This calculation has not been validated in all clinical situations. eGFR's persistently <60 mL/min signify possible Chronic Kidney Disease.    Anion gap 11 5 - 15    Comment: Performed at [facility], [place] [address] [place], [place] [address]   Ct Tibia Fibula Left Wo Contrast  Result Date: 08/03/2018 CLINICAL DATA:  Left lower leg injury secondary to a trip and fall today while walking. Initial encounter. EXAM: CT OF THE LOWER LEFT EXTREMITY WITHOUT CONTRAST TECHNIQUE: Multidetector CT imaging of the lower left extremity was performed according to the standard protocol. COMPARISON:  Plain films left lower leg this same scratch the plain films left knee this same day. FINDINGS: Bones/Joint/Cartilage As seen on the comparison plain films, the patient has a nondisplaced fracture of the proximal tibia. The main component of the fracture is obliquely transverse in orientation originating in the lateral metaphysis 5 cm below the joint line and extending inferiorly through the medial metaphysis approximately 10 cm below the joint line. A longitudinal fracture line extends cephalad through the medial tibial eminence. The tibial plateaus are preserved. No other fracture is identified. Ligaments Suboptimally assessed by CT. The cruciate and collateral ligaments appear intact. Muscles and Tendons Intact and normal in appearance. Soft tissues Small lipohemarthrosis is noted. IMPRESSION: Nondisplaced fracture of the proximal metaphysis and diaphysis of  the tibia includes a longitudinal component extending cephalad through the medial tibial eminence. The tibial plateaus are intact. No other fracture is identified. Electronically Signed   By: Inge Rise M.D.   On: 08/03/2018 16:05   Dg Knee Complete 4 Views Left  Result Date: 08/03/2018 CLINICAL DATA:  Acute LEFT knee pain following fall today. Initial encounter. EXAM: LEFT KNEE - COMPLETE 4+ VIEW COMPARISON:  None. FINDINGS: A mildly comminuted proximal tibial fracture is noted from the interspinous region inferiorly 10 cm to the MEDIAL cortex. 2 mm LATERAL displacement at the MEDIAL cortex fracture noted. Small lipohemarthrosis identified. No dislocation. IMPRESSION: Mildly comminuted intra-articular proximal tibial fracture extending inferiorly from the articular surface 10 cm. Electronically Signed   By: Margarette Canada M.D.   On: 08/03/2018 13:24    ROS as above; No recent fever, bleeding abnormalities, GI problems, or weight gain.  Blood pressure (!) 143/66, pulse 70, temperature 98.1 F (36.7 C), temperature source Oral, resp. rate 20, height '6\' 2"'  (1.88 m), weight 127.2 kg, SpO2 96 %. Physical Exam NCAT. Very pleasant RRR Deep breathing but no audible wheezing S/NT/ND LLE Dressing intact, clean, immobilizer and ACE foot to thigh  Edema/ swelling controlled  Sens: DPN, SPN, TN intact but reports slight paresthesia in SPN distribution  Motor: EHL, FHL, and lessor toe ext and flex all intact grossly  Brisk cap refill, warm to touch, DP 2+   Assessment/Plan  1. LEFT TIBIAL PLATEAU BICONDYLAR 2. URINARY RETENTION 3. MILD PARESTHESIA LEFT SPN  Recommend ORIF with anterior compartment fasciotomy  I discussed with the patient and his brother the risks and benefits of surgery, including the possibility of infection, nerve injury, vessel injury, wound breakdown, arthritis, symptomatic hardware, DVT/ PE, loss of motion, malunion, nonunion, and need for further surgery among others.  They  acknowledged these risks and wished to proceed.   Altamese [place], MD [facility], [facility] [phone]  08/04/2018, 9:46 AM

## 2018-08-04 NOTE — Anesthesia Procedure Notes (Addendum)
Procedure Name: LMA Insertion Date/Time: 08/04/2018 10:20 AM Performed by: Cleda Daub, CRNA Pre-anesthesia Checklist: Patient identified, Emergency Drugs available, Suction available and Patient being monitored Patient Re-evaluated:Patient Re-evaluated prior to induction Oxygen Delivery Method: Circle system utilized Preoxygenation: Pre-oxygenation with 100% oxygen Induction Type: IV induction Ventilation: Two handed mask ventilation required, Oral airway inserted - appropriate to patient size and Mask ventilation throughout procedure LMA: LMA inserted LMA Size: 5.0 Tube type: Oral Placement Confirmation: ETT inserted through vocal cords under direct vision,  positive ETCO2 and breath sounds checked- equal and bilateral Tube secured with: Tape Dental Injury: Teeth and Oropharynx as per pre-operative assessment  Future Recommendations: Recommend- induction with short-acting agent, and alternative techniques readily available Comments: Small mouth opening, thick neck, deep and anterior airway made intubation difficult: Grade 2 view with MAC 3 and lots of cricoid pressure  on the first attempt then Dr. Sabra Heck tried with a bougie.  ETT plan changed to LMA.

## 2018-08-04 NOTE — Transfer of Care (Signed)
Immediate Anesthesia Transfer of Care Note  Patient: Michael Pearson  Procedure(s) Performed: OPEN REDUCTION INTERNAL FIXATION (ORIF) TIBIAL PLATEAU (Left Leg Lower)  Patient Location: PACU  Anesthesia Type:General  Level of Consciousness: drowsy  Airway & Oxygen Therapy: Patient Spontanous Breathing and Patient connected to face mask oxygen  Post-op Assessment: Report given to RN and Post -op Vital signs reviewed and stable  Post vital signs: Reviewed and stable  Last Vitals:  Vitals Value Taken Time  BP 152/76 08/04/2018 12:52 PM  Temp 36.1 C 08/04/2018 12:52 PM  Pulse 81 08/04/2018 12:56 PM  Resp 18 08/04/2018 12:56 PM  SpO2 97 % 08/04/2018 12:56 PM  Vitals shown include unvalidated device data.  Last Pain:  Vitals:   08/04/18 1252  TempSrc:   PainSc: Asleep      Patients Stated Pain Goal: 3 (88/45/73 3448)  Complications: No apparent anesthesia complications

## 2018-08-04 NOTE — Anesthesia Postprocedure Evaluation (Signed)
Anesthesia Post Note  Patient: CHAMPION CORALES  Procedure(s) Performed: OPEN REDUCTION INTERNAL FIXATION (ORIF) TIBIAL PLATEAU (Left Leg Lower)     Patient location during evaluation: PACU Anesthesia Type: General Level of consciousness: awake and alert Pain management: pain level controlled Vital Signs Assessment: post-procedure vital signs reviewed and stable Respiratory status: spontaneous breathing, nonlabored ventilation and respiratory function stable Cardiovascular status: blood pressure returned to baseline and stable Postop Assessment: no apparent nausea or vomiting Anesthetic complications: no    Last Vitals:  Vitals:   08/04/18 1407 08/04/18 1422  BP: (!) 149/92 (!) 155/73  Pulse: 98 96  Resp: 13 (!) 23  Temp:    SpO2: 98% 90%    Last Pain:  Vitals:   08/04/18 1352  TempSrc:   PainSc: 0-No pain                 Lynda Rainwater

## 2018-08-04 NOTE — Op Note (Addendum)
08/03/2018 - 08/04/2018  12:39 PM  PATIENT:  Michael Pearson  57 y.o. male  PRE-OPERATIVE DIAGNOSIS:   1. LEFT BICONDYLAR TIBIAL PLATEAU FRACTURE 2. LEFT KNEE HEMARTHROSIS  POST-OPERATIVE DIAGNOSIS:   1. LEFT BICONDYLAR TIBIAL PLATEAU FRACTURE 2. LEFT KNEE HEMARTHROSIS  PROCEDURE:  Procedure(s): 1. OPEN REDUCTION INTERNAL FIXATION (ORIF) BICONDYLAR TIBIAL PLATEAU (Left) 2. ANTERIOR COMPARTMENT FASCIOTOMY 3. ASPIRATION OF LEFT KNEE HEMARTHROSIS 4. FLUORO OF LEFT KNEE UNDER STRESS POST REPAIR  SURGEON:  Surgeon(s) and Role:    [place], MD - Primary  PHYSICIAN ASSISTANT: Ainsley Spinner, PA-C  ANESTHESIA:   general  EBL:  60 mL   BLOOD ADMINISTERED:none  DRAINS: none   LOCAL MEDICATIONS USED:  NONE  SPECIMEN:  No Specimen  DISPOSITION OF SPECIMEN:  N/A  COUNTS:  YES  TOURNIQUET: None.  DICTATION: Written below.  PLAN OF CARE: Admit to inpatient   PATIENT DISPOSITION:  PACU - hemodynamically stable.   Delay start of Pharmacological VTE agent (>24hrs) due to surgical blood loss or risk of bleeding: no    BRIEF SUMMARY AND INDICATION FOR PROCEDURE:  Patient is a 57 y.o.-year- old with a tibial plateau fracture, treated provisionally with immediate immobilization, elevation, and active motion of the foot and toes to facilitate resolution of soft tissue swelling.  We did discuss with the patient and his brother the risks and benefits of surgical treatment including the potential for arthritis, nerve injury, vessel injury, loss of motion, DVT, PE, heart attack, stroke, symptomatic hardware, need for further surgery, and multiple others.  The patient and his brother acknowledged these risks and wished to proceed.  BRIEF SUMMARY OF PROCEDURE:  After administration of preoperative antibiotics, the patient was taken to the operating room.  General anesthesia was induced and the lower extremity prepped and draped in usual sterile fashion using a chlorhexidine wash and  betadine scrub and paint. A timeout was performed. I then brought in the radiolucent triangle. We began on the medial side, using C-arm to identify the fracture site exit in the metaphysis. I then carefully incised and retracted the saphenous vein and nerve going down to periosteum to palpate the fracture site. We turned attention back to the lateral side. A curvilinear incision was made extending laterally over Gerdy's tubercle. Dissection was carried down where the soft tissues were left intact to the lateral plateau and rim, elevated only the insertion of the extensors. I was then able to insert the 9 hole Zimmer NCB tibial plateau plate and pin it proximally at the joint line. Going back to the medial side a 5 hole DCP plate was pinned in position to buttress the medial platea. My assistant pulled traction and derotated the fracture while I used the [employer] clamp to achieve reduction. I then placed three bicortical screws distally securing the buttress effect. At this point, we placed standard lag screw fixation in the proximal row of the plate, which we would later convert to locked fixation by placing locking caps. Final images showed appropriate reduction, implant position and length. No joint space gapping to suggest ligamentous instability was apparent with stress evaluation of the knee joint after repair. All wounds were irrigated thoroughly.  Prior to closure, I turned my attention to the distal edge of the wound here underneath the skin.  I used the long scissors to spread both superficial and deep to the anterior compartment.  The fascia was then released for 8 to 10 cm to reduce the likelihood of the postoperative compartment  syndrome.  Once more, wound was irrigated and then a standard layered closure performed, 0 Vicryl, 2-0 Vicryl, and 3-0 nylon for the skin.  Sterile gently compressive dressing was applied and in knee immobilizer.  The patient was taken to the PACU in  stable condition.   Lastly, the large knee hemarthrosis was aspirated removing 110 cc of blood.  Ainsley Spinner, PA-C was present and assisting throughout with all portions of the procedure.  PROGNOSIS: The patient will not require formal bracing given the stable examination post fixation. Unrestricted range of motion will begin immediately.  He will be nonweightbearing on the operative extremity, be on pharmacologic DVT prophylaxis, and mobilized with PT and OT. After discharge, we will plan to see him back in about 2 weeks for removal of sutures. Discharge anticipated in two days.     Astrid Divine. Marcelino Scot, M.D.

## 2018-08-04 NOTE — ED Notes (Addendum)
Consent has not been done due to the patient's POA meeting him over at [facility]. This patient's POA MUST sign for this patient.

## 2018-08-05 ENCOUNTER — Inpatient Hospital Stay (HOSPITAL_COMMUNITY): Payer: Self-pay

## 2018-08-05 LAB — COMPREHENSIVE METABOLIC PANEL
ALBUMIN: 3.2 g/dL — AB (ref 3.5–5.0)
ALK PHOS: 46 U/L (ref 38–126)
ALT: 54 U/L — ABNORMAL HIGH (ref 0–44)
ANION GAP: 9 (ref 5–15)
AST: 74 U/L — ABNORMAL HIGH (ref 15–41)
BUN: 15 mg/dL (ref 6–20)
CALCIUM: 8.7 mg/dL — AB (ref 8.9–10.3)
CO2: 27 mmol/L (ref 22–32)
Chloride: 102 mmol/L (ref 98–111)
Creatinine, Ser: 0.97 mg/dL (ref 0.61–1.24)
Glucose, Bld: 125 mg/dL — ABNORMAL HIGH (ref 70–99)
POTASSIUM: 4.2 mmol/L (ref 3.5–5.1)
SODIUM: 138 mmol/L (ref 135–145)
TOTAL PROTEIN: 5.7 g/dL — AB (ref 6.5–8.1)
Total Bilirubin: 0.7 mg/dL (ref 0.3–1.2)

## 2018-08-05 LAB — CBC WITH DIFFERENTIAL/PLATELET
ABS IMMATURE GRANULOCYTES: 0 10*3/uL (ref 0.0–0.1)
BASOS ABS: 0 10*3/uL (ref 0.0–0.1)
BASOS PCT: 0 %
Eosinophils Absolute: 0 10*3/uL (ref 0.0–0.7)
Eosinophils Relative: 0 %
HCT: 36.5 % — ABNORMAL LOW (ref 39.0–52.0)
Hemoglobin: 12 g/dL — ABNORMAL LOW (ref 13.0–17.0)
Immature Granulocytes: 0 %
LYMPHS PCT: 15 %
Lymphs Abs: 1.2 10*3/uL (ref 0.7–4.0)
MCH: 32.1 pg (ref 26.0–34.0)
MCHC: 32.9 g/dL (ref 30.0–36.0)
MCV: 97.6 fL (ref 78.0–100.0)
MONO ABS: 0.8 10*3/uL (ref 0.1–1.0)
Monocytes Relative: 9 %
NEUTROS PCT: 76 %
Neutro Abs: 6.3 10*3/uL (ref 1.7–7.7)
PLATELETS: 172 10*3/uL (ref 150–400)
RBC: 3.74 MIL/uL — AB (ref 4.22–5.81)
RDW: 13.5 % (ref 11.5–15.5)
WBC: 8.3 10*3/uL (ref 4.0–10.5)

## 2018-08-05 LAB — MAGNESIUM: MAGNESIUM: 2.3 mg/dL (ref 1.7–2.4)

## 2018-08-05 LAB — TSH: TSH: 0.564 u[IU]/mL (ref 0.350–4.500)

## 2018-08-05 LAB — PHOSPHORUS: PHOSPHORUS: 3.9 mg/dL (ref 2.5–4.6)

## 2018-08-05 NOTE — Plan of Care (Signed)
  Problem: Education: Goal: Knowledge of General Education information will improve Description Including pain rating scale, medication(s)/side effects and non-pharmacologic comfort measures Outcome: Progressing   

## 2018-08-05 NOTE — Evaluation (Signed)
Physical Therapy Evaluation Patient Details Name: Michael Pearson MRN: [id number] DOB: [birth year] Today's Date: 08/05/2018   History of Present Illness  Pt is a 57 y/o male who was admitted after he tripped and fell on the handicap ramp at his home. S/p ORIF L bicondylar tibial plateau, anterior comaprtment fasciotomy, and aspiration of L knee hemarthrosis. PMH signficant for but not limited to: arthritis, CAD, hiatal hernia, HTN, migranes.     Clinical Impression  Pt presented supine in bed with HOB elevated, awake and willing to participate in therapy session. Prior to admission, pt reported that he was independent with all functional mobility and ADLs. Pt lives with his brother who can be available intermittently to assist. Pt currently at a min guard level overall for functional mobility. Limited secondary to pain and fatigue. Pt would continue to benefit from skilled physical therapy services at this time while admitted and after d/c to address the below listed limitations in order to improve overall safety and independence with functional mobility.     Follow Up Recommendations Home health PT;Supervision/Assistance - 24 hour    Equipment Recommendations  None recommended by PT    Recommendations for Other Services       Precautions / Restrictions Precautions Precautions: Fall Restrictions Weight Bearing Restrictions: Yes LLE Weight Bearing: Non weight bearing      Mobility  Bed Mobility Overal bed mobility: Needs Assistance Bed Mobility: Supine to Sit     Supine to sit: Supervision;HOB elevated     General bed mobility comments: superivsion for safety, no physical assist required  Transfers Overall transfer level: Needs assistance Equipment used: Rolling walker (2 wheeled) Transfers: Sit to/from Stand Sit to Stand: Min guard;+2 safety/equipment         General transfer comment: min guard for safety and balance, cueing for hand placement and safety    Ambulation/Gait Ambulation/Gait assistance: Min guard;+2 safety/equipment Gait Distance (Feet): 10 Feet Assistive device: Rolling walker (2 wheeled) Gait Pattern/deviations: (hop-to on R LE) Gait velocity: decreased Gait velocity interpretation: <1.31 ft/sec, indicative of household ambulator General Gait Details: pt with modest instability and a bit impulsive; close min guard for safety; pt able to maintain NWB L LE throughout independently   Stairs            Wheelchair Mobility    Modified Rankin (Stroke Patients Only)       Balance Overall balance assessment: Needs assistance Sitting-balance support: No upper extremity supported;Feet supported Sitting balance-Leahy Scale: Good     Standing balance support: Bilateral upper extremity supported;During functional activity Standing balance-Leahy Scale: Poor Standing balance comment: reliant on B UE support                             Pertinent Vitals/Pain Pain Assessment: Faces Faces Pain Scale: Hurts little more Pain Location: L LE  Pain Descriptors / Indicators: Discomfort;Grimacing;Guarding Pain Intervention(s): Monitored during session;Repositioned    Home Living Family/patient expects to be discharged to:: Private residence Living Arrangements: Other relatives(brother ) Available Help at Discharge: Family;Available PRN/intermittently(works during the day ) Type of Home: House Home Access: Ramped entrance     Home Layout: One level Home Equipment: Bedside commode;Shower seat;Walker - 2 wheels;Wheelchair - manual;Tub bench;Grab bars - toilet;Grab bars - tub/shower      Prior Function Level of Independence: Independent         Comments: independent with ADLs, IADLs and mobility     Hand Dominance  Dominant Hand: Right    Extremity/Trunk Assessment   Upper Extremity Assessment Upper Extremity Assessment: Overall WFL for tasks assessed;Defer to OT evaluation    Lower Extremity  Assessment Lower Extremity Assessment: LLE deficits/detail LLE Deficits / Details: pt with decreased strength and ROM limitations secondary to post-op pain and weakness LLE: Unable to fully assess due to pain       Communication   Communication: HOH  Cognition Arousal/Alertness: Awake/alert Behavior During Therapy: Anxious;Impulsive Overall Cognitive Status: History of cognitive impairments - at baseline Area of Impairment: Safety/judgement;Following commands;Problem solving                       Following Commands: Follows multi-step commands with increased time Safety/Judgement: Decreased awareness of safety   Problem Solving: Slow processing;Requires verbal cues        General Comments      Exercises     Assessment/Plan    PT Assessment Patient needs continued PT services  PT Problem List Decreased strength;Decreased range of motion;Decreased activity tolerance;Decreased balance;Decreased mobility;Decreased coordination;Decreased knowledge of use of DME;Decreased knowledge of precautions;Decreased safety awareness;Pain       PT Treatment Interventions DME instruction;Gait training;Stair training;Functional mobility training;Therapeutic activities;Therapeutic exercise;Balance training;Neuromuscular re-education;Patient/family education    PT Goals (Current goals can be found in the Care Plan section)  Acute Rehab PT Goals Patient Stated Goal: to get home  PT Goal Formulation: With patient Time For Goal Achievement: 08/19/18 Potential to Achieve Goals: Good    Frequency Min 3X/week   Barriers to discharge        Co-evaluation PT/OT/SLP Co-Evaluation/Treatment: Yes Reason for Co-Treatment: To address functional/ADL transfers;For patient/therapist safety PT goals addressed during session: Mobility/safety with mobility;Balance;Proper use of DME;Strengthening/ROM         AM-PAC PT "6 Clicks" Daily Activity  Outcome Measure Difficulty turning over in  bed (including adjusting bedclothes, sheets and blankets)?: A Lot Difficulty moving from lying on back to sitting on the side of the bed? : A Lot Difficulty sitting down on and standing up from a chair with arms (e.g., wheelchair, bedside commode, etc,.)?: Unable Help needed moving to and from a bed to chair (including a wheelchair)?: A Little Help needed walking in hospital room?: A Little Help needed climbing 3-5 steps with a railing? : A Lot 6 Click Score: 13    End of Session Equipment Utilized During Treatment: Gait belt Activity Tolerance: Patient tolerated treatment well Patient left: in chair;with call bell/phone within reach;with family/visitor present Nurse Communication: Mobility status PT Visit Diagnosis: Other abnormalities of gait and mobility (R26.89);Pain Pain - Right/Left: Left Pain - part of body: Leg    Time: 2683-4196 PT Time Calculation (min) (ACUTE ONLY): 27 min   Charges:   PT Evaluation $PT Eval Moderate Complexity: 1 Mod          Sherie Don, PT, DPT  Acute Rehabilitation Services Pager [phone] Office [place] 08/05/2018, 4:30 PM

## 2018-08-05 NOTE — Progress Notes (Signed)
Orthopedic Trauma Service Progress Note   Patient ID: Michael Pearson MRN: [id number] DOB/AGE: 02-Dec-1960 57 y.o.  Subjective:  Doing well Describes pain as more soreness than anything else  Mobilized with PT and OT   Increased hearing difficulties over last several weeks    Review of Systems  Constitutional: Negative for chills and fever.  Respiratory: Negative for shortness of breath and wheezing.   Cardiovascular: Negative for chest pain and palpitations.  Gastrointestinal: Negative for abdominal pain, nausea and vomiting.  Neurological: Negative for tingling and sensory change.    Objective:   VITALS:   Vitals:   08/04/18 1558 08/04/18 2015 08/05/18 0006 08/05/18 1508  BP: (!) 146/83 127/75 119/78 139/78  Pulse: 96 (!) 101 92 (!) 104  Resp: 17 18 18 17   Temp: 98.4 F (36.9 C)  98.2 F (36.8 C) 98.4 F (36.9 C)  TempSrc: Oral  Oral Oral  SpO2: 95% 97% 97% 96%  Weight:      Height:        Estimated body mass index is 36 kg/m as calculated from the following:   Height as of this encounter: 6\' 2"  (1.88 m).   Weight as of this encounter: 127.2 kg.   Intake/Output      10/04 0701 - 10/05 0700 10/05 0701 - 10/06 0700   P.O. 720 960   I.V. (mL/kg) 1300 (10.2)    Other 900    IV Piggyback 1400.4    Total Intake(mL/kg) 4320.4 (34) 960 (7.5)   Urine (mL/kg/hr) 1850 (0.6) 1700 (1.4)   Stool  0   Blood 60    Total Output 1910 1700   Net +2410.4 -740        Stool Occurrence  1 x     LABS  Results for orders placed or performed during the hospital encounter of 08/03/18 (from the past 24 hour(s))  TSH     Status: None   Collection Time: 08/05/18  5:14 AM  Result Value Ref Range   TSH 0.564 0.350 - 4.500 uIU/mL  Comprehensive metabolic panel     Status: Abnormal   Collection Time: 08/05/18  5:14 AM  Result Value Ref Range   Sodium 138 135 - 145 mmol/L   Potassium 4.2 3.5 - 5.1 mmol/L   Chloride 102 98 - 111 mmol/L   CO2 27 22 - 32  mmol/L   Glucose, Bld 125 (H) 70 - 99 mg/dL   BUN 15 6 - 20 mg/dL   Creatinine, Ser 0.97 0.61 - 1.24 mg/dL   Calcium 8.7 (L) 8.9 - 10.3 mg/dL   Total Protein 5.7 (L) 6.5 - 8.1 g/dL   Albumin 3.2 (L) 3.5 - 5.0 g/dL   AST 74 (H) 15 - 41 U/L   ALT 54 (H) 0 - 44 U/L   Alkaline Phosphatase 46 38 - 126 U/L   Total Bilirubin 0.7 0.3 - 1.2 mg/dL   GFR calc non Af Amer >60 >60 mL/min   GFR calc Af Amer >60 >60 mL/min   Anion gap 9 5 - 15  CBC with Differential/Platelet     Status: Abnormal   Collection Time: 08/05/18  5:14 AM  Result Value Ref Range   WBC 8.3 4.0 - 10.5 K/uL   RBC 3.74 (L) 4.22 - 5.81 MIL/uL   Hemoglobin 12.0 (L) 13.0 - 17.0 g/dL   HCT 36.5 (L) 39.0 - 52.0 %   MCV 97.6 78.0 - 100.0 fL   MCH 32.1 26.0 - 34.0 pg  MCHC 32.9 30.0 - 36.0 g/dL   RDW 13.5 11.5 - 15.5 %   Platelets 172 150 - 400 K/uL   Neutrophils Relative % 76 %   Neutro Abs 6.3 1.7 - 7.7 K/uL   Lymphocytes Relative 15 %   Lymphs Abs 1.2 0.7 - 4.0 K/uL   Monocytes Relative 9 %   Monocytes Absolute 0.8 0.1 - 1.0 K/uL   Eosinophils Relative 0 %   Eosinophils Absolute 0.0 0.0 - 0.7 K/uL   Basophils Relative 0 %   Basophils Absolute 0.0 0.0 - 0.1 K/uL   Immature Granulocytes 0 %   Abs Immature Granulocytes 0.0 0.0 - 0.1 K/uL  Phosphorus     Status: None   Collection Time: 08/05/18  5:14 AM  Result Value Ref Range   Phosphorus 3.9 2.5 - 4.6 mg/dL  Magnesium     Status: None   Collection Time: 08/05/18  5:14 AM  Result Value Ref Range   Magnesium 2.3 1.7 - 2.4 mg/dL     PHYSICAL EXAM:   Gen: in bed, pleasant, NAD Lungs: CTA B  Cardiac: RRR, s1 and s2 Abd:+ BS, NTND Ext:       Left Lower Extremity   Dressing c/d/i  Ext warm   Motor and sensory functions intact  No DCT   + DP pulse   Assessment/Plan: 1 Day Post-Op   Active Problems:   Fracture   Tibial fracture   Closed bicondylar fracture of left tibial plateau   Anti-infectives (From admission, onward)   Start     Dose/Rate Route  Frequency Ordered Stop   08/04/18 1700  ceFAZolin (ANCEF) IVPB 2g/100 mL premix     2 g 200 mL/hr over 30 Minutes Intravenous Every 6 hours 08/04/18 1550 08/05/18 0930    .  POD/HD#: 1  57 y/o male with complex medical history including pituitary tumor with ground level fall and L bicondylar tibial plateau fracture   -L bicondylar tibial plateau fracture  NWB L leg x 6-8 weeks  Unrestricted ROM L knee  Ice and elevate  No pillows under bend of knee while at rest  PT/OT  - Pain management:  Continue with current regimen   - ABL anemia/Hemodynamics  Stable  - Medical issues   Repeat MRI shows stable pituitary lesion    Recommend outpt follow up with endocrine regarding worsening hearing over last several months. Will relay to PCP, endocrine.   - DVT/PE prophylaxis:  Lovenox   - ID:   periop abx  - Metabolic Bone Disease:  Labs pending  - Activity:  OOB with assist   NWB L leg   - FEN/GI prophylaxis/Foley/Lines:  Reg diet   NSL   - Impediments to fracture healing:  Endocrinopathy   - Dispo:  Therapies  Dc home tomorrow likely   Jari Pigg, PA-C [facility] [phone] [name] (C) 08/05/2018, 4:52 PM

## 2018-08-05 NOTE — Evaluation (Signed)
Occupational Therapy Evaluation Patient Details Name: Michael Pearson MRN: [id number] DOB: Jan 05, 1961 Today's Date: 08/05/2018    History of Present Illness Pt is a 57 y/o male who was admitted after he tripped and fell on the handicap ramp at his home. S/p ORIF L bicondylar tibial plateau, anterior comaprtment fasciotomy, and aspiration of L knee hemarthrosis. PMH signficant for but not limited to: arthritis, CAD, hiatal hernia, HTN, migranes.    Clinical Impression   PTA patient independent with ADLs and moblity.  Admitted for above and limited by below (see problem list).  Patient currently requires setup assist for UB ADLs, modA for LB bathing, total assist for LB dressing, min guard assist for simulated toilet transfers, and total assist for toileting.  Patient educated on precautions, safety, ADL compensatory techniques, and recommendations.  Agreeable to having support at home during transfers/ ADLs, brother available intermittently throughout the day and in the evenings to assist. Will benefit from continued OT services while admitted in order to maximize safety and independence prior to dc home, but anticipate no further OT needs at dc.  Will continue to follow.     Follow Up Recommendations  No OT follow up;Supervision - Intermittent(supervision for transfers and ADLs)    Equipment Recommendations  None recommended by OT    Recommendations for Other Services       Precautions / Restrictions Precautions Precautions: Fall Restrictions Weight Bearing Restrictions: Yes LLE Weight Bearing: Non weight bearing      Mobility Bed Mobility Overal bed mobility: Needs Assistance Bed Mobility: Supine to Sit     Supine to sit: Supervision;HOB elevated     General bed mobility comments: superivsion for safety, no physical assist required  Transfers Overall transfer level: Needs assistance Equipment used: Rolling walker (2 wheeled) Transfers: Sit to/from Stand Sit to Stand: Min  guard;+2 safety/equipment         General transfer comment: min guard for safety and balance, cueing for hand placement and safety     Balance Overall balance assessment: Needs assistance Sitting-balance support: No upper extremity supported;Feet supported Sitting balance-Leahy Scale: Good     Standing balance support: Bilateral upper extremity supported;During functional activity Standing balance-Leahy Scale: Poor Standing balance comment: reliant on B UE support                           ADL either performed or assessed with clinical judgement   ADL Overall ADL's : Needs assistance/impaired     Grooming: Set up;Sitting   Upper Body Bathing: Set up;Sitting   Lower Body Bathing: Moderate assistance;Sitting/lateral leans Lower Body Bathing Details (indicate cue type and reason): assist for R LE and buttocks Upper Body Dressing : Set up;Sitting   Lower Body Dressing: Sit to/from stand;Total assistance;+2 for safety/equipment Lower Body Dressing Details (indicate cue type and reason): decreased reach and impaired balance reliant on B UE support Toilet Transfer: Min guard;+2 for safety/equipment;Ambulation;RW(simulated to recliner )   Toileting- Clothing Manipulation and Hygiene: Total assistance;Sit to/from stand;+2 for safety/equipment       Functional mobility during ADLs: Min guard;Rolling walker General ADL Comments: Pt completed bed mobility, short distance mobility, transfers and grooming.       Vision Baseline Vision/History: Wears glasses Wears Glasses: At all times Patient Visual Report: No change from baseline Vision Assessment?: No apparent visual deficits     Perception     Praxis      Pertinent Vitals/Pain Pain Assessment: Faces Faces Pain Scale:  Hurts little more Pain Location: L LE  Pain Descriptors / Indicators: Discomfort;Grimacing;Guarding Pain Intervention(s): Limited activity within patient's tolerance;Premedicated before  session;Repositioned     Hand Dominance Right   Extremity/Trunk Assessment Upper Extremity Assessment Upper Extremity Assessment: Overall WFL for tasks assessed   Lower Extremity Assessment Lower Extremity Assessment: Defer to PT evaluation       Communication Communication Communication: HOH   Cognition Arousal/Alertness: Awake/alert Behavior During Therapy: Anxious;Impulsive Overall Cognitive Status: History of cognitive impairments - at baseline Area of Impairment: Safety/judgement;Following commands;Problem solving                       Following Commands: Follows multi-step commands with increased time Safety/Judgement: Decreased awareness of safety   Problem Solving: Slow processing;Requires verbal cues     General Comments  brother present and supportive    Exercises     Shoulder Instructions      Home Living Family/patient expects to be discharged to:: Private residence Living Arrangements: Other relatives(brother ) Available Help at Discharge: Family;Available PRN/intermittently(works during the day ) Type of Home: House Home Access: Ramped entrance     Home Layout: One level     Bathroom Shower/Tub: Occupational psychologist: Standard     Home Equipment: Bedside commode;Shower seat;Walker - 2 wheels;Wheelchair - manual;Tub bench;Grab bars - toilet;Grab bars - tub/shower          Prior Functioning/Environment Level of Independence: Independent        Comments: independent with ADLs, IADLs and mobility        OT Problem List: Decreased activity tolerance;Impaired balance (sitting and/or standing);Decreased safety awareness;Decreased knowledge of use of DME or AE;Decreased knowledge of precautions;Pain      OT Treatment/Interventions: Self-care/ADL training;Therapeutic exercise;Energy conservation;DME and/or AE instruction;Therapeutic activities;Patient/family education;Balance training    OT Goals(Current goals can be  found in the care plan section) Acute Rehab OT Goals Patient Stated Goal: to get home  OT Goal Formulation: With patient Time For Goal Achievement: 08/19/18 Potential to Achieve Goals: Good  OT Frequency: Min 2X/week   Barriers to D/C:            Co-evaluation PT/OT/SLP Co-Evaluation/Treatment: Yes Reason for Co-Treatment: For patient/therapist safety;To address functional/ADL transfers   OT goals addressed during session: ADL's and self-care;Other (comment)(transfers/mobility)      AM-PAC PT "6 Clicks" Daily Activity     Outcome Measure Help from another person eating meals?: None Help from another person taking care of personal grooming?: None Help from another person toileting, which includes using toliet, bedpan, or urinal?: Total Help from another person bathing (including washing, rinsing, drying)?: A Lot Help from another person to put on and taking off regular upper body clothing?: None Help from another person to put on and taking off regular lower body clothing?: A Lot 6 Click Score: 17   End of Session Equipment Utilized During Treatment: Gait belt;Rolling walker Nurse Communication: Mobility status  Activity Tolerance: Patient tolerated treatment well Patient left: in chair;with call bell/phone within reach;with chair alarm set;with family/visitor present  OT Visit Diagnosis: Other abnormalities of gait and mobility (R26.89);Unsteadiness on feet (R26.81);Pain Pain - Right/Left: Left Pain - part of body: Leg                Time: 1540-0867 OT Time Calculation (min): 24 min Charges:  OT General Charges $OT Visit: 1 Visit OT Evaluation $OT Eval Moderate Complexity: [place], OT Acute Rehabilitation Services Pager [phone]  Office [place] 08/05/2018, 11:46 AM

## 2018-08-06 ENCOUNTER — Encounter (HOSPITAL_COMMUNITY): Payer: Self-pay | Admitting: Orthopedic Surgery

## 2018-08-06 DIAGNOSIS — I2510 Atherosclerotic heart disease of native coronary artery without angina pectoris: Secondary | ICD-10-CM | POA: Diagnosis present

## 2018-08-06 DIAGNOSIS — D352 Benign neoplasm of pituitary gland: Secondary | ICD-10-CM

## 2018-08-06 DIAGNOSIS — I10 Essential (primary) hypertension: Secondary | ICD-10-CM | POA: Diagnosis present

## 2018-08-06 LAB — CBC
HEMATOCRIT: 37.2 % — AB (ref 39.0–52.0)
HEMOGLOBIN: 12 g/dL — AB (ref 13.0–17.0)
MCH: 31.3 pg (ref 26.0–34.0)
MCHC: 32.3 g/dL (ref 30.0–36.0)
MCV: 96.9 fL (ref 78.0–100.0)
Platelets: 174 10*3/uL (ref 150–400)
RBC: 3.84 MIL/uL — AB (ref 4.22–5.81)
RDW: 13.7 % (ref 11.5–15.5)
WBC: 6.6 10*3/uL (ref 4.0–10.5)

## 2018-08-06 LAB — PROLACTIN: Prolactin: 24.1 ng/mL — ABNORMAL HIGH (ref 4.0–15.2)

## 2018-08-06 LAB — PTH, INTACT AND CALCIUM
CALCIUM TOTAL (PTH): 8.6 mg/dL — AB (ref 8.7–10.2)
PTH: 25 pg/mL (ref 15–65)

## 2018-08-06 MED ORDER — DOCUSATE SODIUM 100 MG PO CAPS: 100.0000 mg | ORAL_CAPSULE | Freq: Two times a day (BID) | ORAL | 0 refills | Status: DC

## 2018-08-06 MED ORDER — ENOXAPARIN (LOVENOX) PATIENT EDUCATION KIT: 1.0000 | PACK | Freq: Once | 0 refills | Status: AC

## 2018-08-06 MED ORDER — ALBUTEROL SULFATE (2.5 MG/3ML) 0.083% IN NEBU: 2.5000 mg | INHALATION_SOLUTION | Freq: Once | RESPIRATORY_TRACT | Status: DC

## 2018-08-06 MED ORDER — ENOXAPARIN SODIUM 40 MG/0.4ML ~~LOC~~ SOLN: 40.0000 mg | SUBCUTANEOUS | 0 refills | Status: DC

## 2018-08-06 MED ORDER — ENOXAPARIN (LOVENOX) PATIENT EDUCATION KIT
PACK | Freq: Once | Status: AC
  Administered 2018-08-06: 17:00:00
  Filled 2018-08-06: qty 1

## 2018-08-06 MED ORDER — ACETAMINOPHEN 500 MG PO TABS: 500.0000 mg | ORAL_TABLET | Freq: Two times a day (BID) | ORAL | 0 refills | Status: DC

## 2018-08-06 MED ORDER — LEVALBUTEROL TARTRATE 45 MCG/ACT IN AERO: 2.0000 | INHALATION_SPRAY | Freq: Once | RESPIRATORY_TRACT | Status: DC

## 2018-08-06 MED ORDER — HYDROCODONE-ACETAMINOPHEN 7.5-325 MG PO TABS: 1.0000 | ORAL_TABLET | Freq: Four times a day (QID) | ORAL | 0 refills | Status: DC | PRN

## 2018-08-06 MED ORDER — ALBUTEROL SULFATE (2.5 MG/3ML) 0.083% IN NEBU
2.5000 mg | INHALATION_SOLUTION | Freq: Once | RESPIRATORY_TRACT | Status: AC
  Administered 2018-08-06: 2.5 mg via RESPIRATORY_TRACT

## 2018-08-06 MED ORDER — METHOCARBAMOL 500 MG PO TABS: 500.0000 mg | ORAL_TABLET | Freq: Three times a day (TID) | ORAL | 0 refills | Status: DC | PRN

## 2018-08-06 NOTE — Care Management Note (Signed)
Case Management Note  Patient Details  Name: Michael Pearson MRN: [id number] Date of Birth: 08-04-1961  Subjective/Objective:   Pt from home with brother for ankle fracture.  Pt has special needs and brother and friend take care of patient's business matters.  Pt is uninsured.          Pt had said he had DME, but has regular size 3n1 and RW.  Pt needs bariatric size.          Pt's brother states they understood he would not receive HH therapy.  Action/Plan: MATCH set up with overrides for lovenox and narcotic.  Explained to pt's brother and friend.  Orders entered and printed to be taken to [facility] store tomorrow.  [employer] will arrange charity to provide DME from store tomorrow.  Pt's friend will pick up from [facility] store tomorrow.     Expected Discharge Date:  08/06/18               Expected Discharge Plan:  Home/Self Care  In-House Referral:  NA  Discharge planning Services  CM Consult, Medication Assistance, [place] Program  Post Acute Care Choice:  Durable Medical Equipment Choice offered to:     DME Arranged:  3-N-1, Walker rolling DME Agency:  [place]:    [facility] Agency:     Status of Service:  Completed, signed off  If discussed at [place] of Stay Meetings, dates discussed:    Additional Comments:  Claudie Leach, RN 08/06/2018, 6:20 PM

## 2018-08-06 NOTE — Progress Notes (Signed)
Occupational Therapy Treatment Patient Details Name: Michael Pearson MRN: [id number] DOB: [birth year] Today's Date: 08/06/2018    History of present illness Pt is a 57 y/o male who was admitted after he tripped and fell on the handicap ramp at his home. S/p ORIF L bicondylar tibial plateau, anterior comaprtment fasciotomy, and aspiration of L knee hemarthrosis. PMH signficant for but not limited to: arthritis, CAD, hiatal hernia, HTN, migranes.    OT comments  Patient progressing well.  Requires min guard assist for toilet transfers for safety and balance.  Attempted shower transfers with pt unable to ascend threshold, encouraged use of tub bench to sit and slide into shower (once cleared by MD) due to NWB precautions.  Reviewed safety precautions as patient will be alone at times during day, agreeable to use urinal and only transfer to 3:1 when assistance is available.  Patient and Michael Pearson plan to have patient setup for daytime before Michael Pearson leaves in the morning, and his Michael Pearson plans to check up him intermittently during the day.  At this time, no further questions or concerns.  Will continue to follow while admitted.    Follow Up Recommendations  No OT follow up;Supervision - Intermittent(supervision for transfers and ADLs)    Equipment Recommendations  None recommended by OT    Recommendations for Other Services      Precautions / Restrictions Precautions Precautions: Fall Restrictions Weight Bearing Restrictions: Yes LLE Weight Bearing: Non weight bearing       Mobility Bed Mobility               General bed mobility comments: seated in recliner upon entering room   Transfers Overall transfer level: Needs assistance Equipment used: Rolling walker (2 wheeled) Transfers: Sit to/from Stand Sit to Stand: Min guard         General transfer comment: min guard for safety and balance    Balance Overall balance assessment: Needs assistance Sitting-balance support: No  upper extremity supported;Feet supported Sitting balance-Leahy Scale: Good     Standing balance support: Bilateral upper extremity supported;During functional activity Standing balance-Leahy Scale: Poor Standing balance comment: reliant on B UE support                           ADL either performed or assessed with clinical judgement   ADL Overall ADL's : Needs assistance/impaired             Lower Body Bathing: Minimal assistance;Sitting/lateral leans Lower Body Bathing Details (indicate cue type and reason): reviewed safety bathing seated, having assistance.          Toilet Transfer: Min guard;Ambulation;RW Toilet Transfer Details (indicate cue type and reason): min guard for safety and balance Toileting- Clothing Manipulation and Hygiene: Supervision/safety;Sitting/lateral lean;Maximal assistance Toileting - Clothing Manipulation Details (indicate cue type and reason): reviewed safety with toileting. educated on use of urinal seated when alone during day, and only transferring to 3:1 when he has assistance available, reviewed maintaining B hand hold on walker while having assistance for clothing management if transferring to 3:1. patient and Michael Pearson agreeable to recommendations  Tub/ Shower Transfer: Walk-in shower;Tub bench;Ambulation;Rolling walker Tub/Shower Transfer Details (indicate cue type and reason): attempted shower transfer but pt unable to clear threshold safely, therefore discussed use of tub bench for pt to sit on bench and slide into shower for increased safety when cleared by MD  Functional mobility during ADLs: Min guard;Rolling walker General ADL Comments: pt continues to present with  unsteadiness during mobility, transfers and ADLs.  Reliant on BUE support, and agreeable to having assist during all mobility, transfers and ADLs.      Vision       Perception     Praxis      Cognition Arousal/Alertness: Awake/alert Behavior During Therapy:  Anxious;Impulsive Overall Cognitive Status: History of cognitive impairments - at baseline Area of Impairment: Safety/judgement;Following commands;Problem solving                       Following Commands: Follows multi-step commands with increased time Safety/Judgement: Decreased awareness of safety   Problem Solving: Slow processing;Requires verbal cues          Exercises     Shoulder Instructions       General Comments Michael Pearson and friend present and supportive    Pertinent Vitals/ Pain       Pain Assessment: Faces Faces Pain Scale: Hurts little more Pain Location: L LE  Pain Descriptors / Indicators: Discomfort;Grimacing;Guarding Pain Intervention(s): Repositioned;Monitored during session  Home Living                                          Prior Functioning/Environment              Frequency  Min 2X/week        Progress Toward Goals  OT Goals(current goals can now be found in the care plan section)  Progress towards OT goals: Progressing toward goals  Acute Rehab OT Goals Patient Stated Goal: to get home  OT Goal Formulation: With patient Time For Goal Achievement: 08/19/18 Potential to Achieve Goals: Good  Plan Discharge plan remains appropriate;Frequency remains appropriate    Co-evaluation                 AM-PAC PT "6 Clicks" Daily Activity     Outcome Measure   Help from another person eating meals?: None Help from another person taking care of personal grooming?: None(seated) Help from another person toileting, which includes using toliet, bedpan, or urinal?: None(using urinal) Help from another person bathing (including washing, rinsing, drying)?: A Lot Help from another person to put on and taking off regular upper body clothing?: None Help from another person to put on and taking off regular lower body clothing?: A Lot 6 Click Score: 20    End of Session Equipment Utilized During Treatment: Gait  belt;Rolling walker  OT Visit Diagnosis: Other abnormalities of gait and mobility (R26.89);Unsteadiness on feet (R26.81);Pain Pain - Right/Left: Left Pain - part of body: Leg   Activity Tolerance Patient tolerated treatment well   Patient Left in chair;with call bell/phone within reach;with chair alarm set;with family/visitor present   Nurse Communication          Time: 3086-5784 OT Time Calculation (min): 28 min  Charges: OT General Charges $OT Visit: 1 Visit OT Treatments $Self Care/Home Management : 23-37 mins  Delight Stare, [place] Pager [phone] Office [phone]    Delight Stare 08/06/2018, 12:45 PM

## 2018-08-06 NOTE — Progress Notes (Signed)
Ainsley Spinner PA was called via phone in regards to possible discharge today.  No discharge order noted.  Awaiting return call.

## 2018-08-06 NOTE — Progress Notes (Signed)
Lovenox starter kit given to the patient, his brother James Poe and [name] Early ( a family friend)  Both were educated in regards to giving lovenox subcut.  Both verbalize understanding instructions. 

## 2018-08-06 NOTE — Progress Notes (Addendum)
Orthopedic Trauma Service Progress Note   Patient ID: Michael Pearson MRN: [id number] DOB/AGE: 04-03-1961 57 y.o.  Subjective:  Doing well this Some mild shortness of breath activity only but dissipates quickly No complaints, pain is tolerable Has worked with therapy and is doing better.  Better stability with bariatric walker Ready to go home Patient has all necessary DME at home.  He lives with his brother  Voiding without difficulty    Review of Systems  Constitutional: Negative for chills and fever.  Eyes: Negative for blurred vision.  Respiratory: Negative for shortness of breath and wheezing.   Cardiovascular: Negative for chest pain and palpitations.  Gastrointestinal: Negative for abdominal pain, nausea and vomiting.  Neurological: Negative for tingling and sensory change.    Objective:   VITALS:   Vitals:   08/05/18 0006 08/05/18 1508 08/05/18 2046 08/06/18 0449  BP: 119/78 139/78 116/70 111/68  Pulse: 92 (!) 104 91 86  Resp: 18 17 16 15   Temp: 98.2 F (36.8 C) 98.4 F (36.9 C) 98.6 F (37 C) 98.2 F (36.8 C)  TempSrc: Oral Oral Oral Oral  SpO2: 97% 96% 97% 99%  Weight:      Height:        Estimated body mass index is 36 kg/m as calculated from the following:   Height as of this encounter: 6\' 2"  (1.88 m).   Weight as of this encounter: 127.2 kg.   Intake/Output      10/05 0701 - 10/06 0700 10/06 0701 - 10/07 0700   P.O. 1200 480   I.V. (mL/kg)     Other     IV Piggyback     Total Intake(mL/kg) 1200 (9.4) 480 (3.8)   Urine (mL/kg/hr) 3700 (1.2) 500 (0.6)   Stool 0 0   Blood     Total Output 3700 500   Net -2500 -20        Stool Occurrence 1 x 1 x     LABS  Results for orders placed or performed during the hospital encounter of 08/03/18 (from the past 24 hour(s))  CBC     Status: Abnormal   Collection Time: 08/06/18  7:48 AM  Result Value Ref Range   WBC 6.6 4.0 - 10.5 K/uL   RBC 3.84 (L) 4.22 - 5.81 MIL/uL    Hemoglobin 12.0 (L) 13.0 - 17.0 g/dL   HCT 37.2 (L) 39.0 - 52.0 %   MCV 96.9 78.0 - 100.0 fL   MCH 31.3 26.0 - 34.0 pg   MCHC 32.3 30.0 - 36.0 g/dL   RDW 13.7 11.5 - 15.5 %   Platelets 174 150 - 400 K/uL     PHYSICAL EXAM:   Gen: Awake and alert, no acute distress, sitting in bedside chair.  Appears very comfortable Lungs: Clear to auscultation bilaterally, no adventitious sounds Cardiac: Slightly tachycardic (110's) on exam but regular rate and rhythm Abd: Soft, nontender, nondistended,+ bowel sounds Ext:       Left lower extremity  Dressings were removed  All incisions look fantastic   Wounds are healing well   No signs of infection   No erythema or active drainage  Swelling is very well controlled  Overall his soft tissue looks very good  DPN, SPN, TN sensory functions are grossly intact  + chronic toe deformities   Ankle extension, flexion, inversion and eversion are intact  + Quad set  + Knee flexion  No deep calf tenderness  Compartments are soft and nontender, no pain with  passive stretching    Assessment/Plan: 2 Days Post-Op   Principal Problem:   Closed bicondylar fracture of left tibial plateau Active Problems:   Microadenoma (HCC)   Hypertension   CAD (coronary artery disease)   Anti-infectives (From admission, onward)   Start     Dose/Rate Route Frequency Ordered Stop   08/04/18 1700  ceFAZolin (ANCEF) IVPB 2g/100 mL premix     2 g 200 mL/hr over 30 Minutes Intravenous Every 6 hours 08/04/18 1550 08/05/18 0930    .  POD/HD#:2  57 y/o male with complex medical history including pituitary tumor with ground level fall and L bicondylar tibial plateau fracture    -L bicondylar tibial plateau fracture             NWB L leg x 6-8 weeks             Unrestricted ROM L knee             Ice and elevate             No pillows under bend of knee while at rest             PT/OT   Dressings changed today.  Can change on a daily basis starting on  08/08/2018  TED hose    - Pain management:             Continue with current regimen    - ABL anemia/Hemodynamics             Stable   - Medical issues              Repeat MRI shows stable pituitary lesion                          Recommend outpt follow up with endocrine regarding worsening hearing over last several months. ? If related to pituitary tumor. Will relay to PCP, endocrine.     - DVT/PE prophylaxis:             Lovenox x 4 weeks    - ID:              periop abx completed    - Metabolic Bone Disease:             Labs pending    Prolactin is elevated at 24.1 ng/mL which is an increase from 2 months ago    - Activity:             OOB with assist              NWB L leg    - FEN/GI prophylaxis/Foley/Lines:             Reg diet     - Impediments to fracture healing:             Endocrinopathy    - Dispo:             Therapies             Dc home today   Follow up with ortho in 2 weeks   Follow up with PCP and endocrine in 10-14 days as well     Jari Pigg, PA-C [facility] [phone] [name] (C) 08/06/2018, 1:38 PM

## 2018-08-06 NOTE — Progress Notes (Signed)
Physical Therapy Treatment Patient Details Name: Michael Pearson MRN: [id number] DOB: Aug 18, 1961 Today's Date: 08/06/2018    History of Present Illness Pt is a 57 y/o male who was admitted after he tripped and fell on the handicap ramp at his home. S/p ORIF L bicondylar tibial plateau, anterior comaprtment fasciotomy, and aspiration of L knee hemarthrosis. PMH signficant for but not limited to: arthritis, CAD, hiatal hernia, HTN, migranes.     PT Comments    Pt presents with improved stability and gait with bariatric RW. Pt demonstrates improved sequencing and safety. Pt's brother is present and very supportive and will be home with pt when he returns home. Pt demonstrates decreased assistance required for sit to stand and gait this session.     Follow Up Recommendations  Home health PT;Supervision/Assistance - 24 hour     Equipment Recommendations  Rolling walker with 5" wheels;Other (comment)(Bariatric RW)    Recommendations for Other Services       Precautions / Restrictions Precautions Precautions: Fall Restrictions Weight Bearing Restrictions: Yes LLE Weight Bearing: Non weight bearing    Mobility  Bed Mobility               General bed mobility comments: seated in recliner upon entering room   Transfers Overall transfer level: Needs assistance Equipment used: Rolling walker (2 wheeled) Transfers: Sit to/from Stand Sit to Stand: Min guard         General transfer comment: min guard for safety and balance  Ambulation/Gait Ambulation/Gait assistance: Min guard Gait Distance (Feet): 50 Feet Assistive device: Rolling walker (2 wheeled) Gait Pattern/deviations: Step-to pattern Gait velocity: decreased   General Gait Details: hop to sequencing improved with heavy duty RW with double front wheels. Improved sequencing and decreased guarding required. Pt does become fatigued easily   Stairs             Wheelchair Mobility    Modified Rankin (Stroke  Patients Only)       Balance Overall balance assessment: Needs assistance Sitting-balance support: No upper extremity supported;Feet supported Sitting balance-Leahy Scale: Good     Standing balance support: Bilateral upper extremity supported;During functional activity Standing balance-Leahy Scale: Poor Standing balance comment: reliant on B UE support                            Cognition Arousal/Alertness: Awake/alert Behavior During Therapy: WFL for tasks assessed/performed Overall Cognitive Status: History of cognitive impairments - at baseline Area of Impairment: Safety/judgement;Following commands;Problem solving                       Following Commands: Follows multi-step commands with increased time Safety/Judgement: Decreased awareness of safety   Problem Solving: Slow processing;Requires verbal cues        Exercises      General Comments General comments (skin integrity, edema, etc.): brother and friend present and supportive      Pertinent Vitals/Pain Pain Assessment: Faces Faces Pain Scale: Hurts a little bit Pain Location: L LE  Pain Descriptors / Indicators: Discomfort;Grimacing;Guarding Pain Intervention(s): Monitored during session;Premedicated before session;Repositioned    Home Living                      Prior Function            PT Goals (current goals can now be found in the care plan section) Acute Rehab PT Goals Patient Stated Goal: to get  home  Progress towards PT goals: Progressing toward goals    Frequency    Min 3X/week      PT Plan Current plan remains appropriate    Co-evaluation              AM-PAC PT "6 Clicks" Daily Activity  Outcome Measure  Difficulty turning over in bed (including adjusting bedclothes, sheets and blankets)?: A Lot Difficulty moving from lying on back to sitting on the side of the bed? : A Lot Difficulty sitting down on and standing up from a chair with arms  (e.g., wheelchair, bedside commode, etc,.)?: A Lot Help needed moving to and from a bed to chair (including a wheelchair)?: A Little Help needed walking in hospital room?: A Little Help needed climbing 3-5 steps with a railing? : A Lot 6 Click Score: 14    End of Session Equipment Utilized During Treatment: Gait belt Activity Tolerance: Patient tolerated treatment well Patient left: in chair;with call bell/phone within reach;with family/visitor present Nurse Communication: Mobility status PT Visit Diagnosis: Other abnormalities of gait and mobility (R26.89);Pain Pain - Right/Left: Left Pain - part of body: Leg     Time: 1020-1048 PT Time Calculation (min) (ACUTE ONLY): 28 min  Charges:  $Gait Training: 8-22 mins $Therapeutic Activity: 8-22 mins                     Scheryl Marten PT, DPT  [phone]    Shanon Rosser 08/06/2018, 1:20 PM

## 2018-08-06 NOTE — Discharge Summary (Signed)
Orthopaedic Trauma Service (OTS)  Patient ID: Michael Pearson MRN: [id number] DOB/AGE: November 07, 1960 57 y.o.  Admit date: 08/03/2018 Discharge date: 08/06/2018  Admission Diagnoses:    Closed bicondylar fracture of left tibial plateau   Microadenoma (HCC)   Hypertension   CAD (coronary artery disease)  Discharge Diagnoses:  Principal Problem:   Closed bicondylar fracture of left tibial plateau Active Problems:   Microadenoma ([place])   Hypertension   CAD (coronary artery disease) Vitamin d insufficiency  Testosterone deficiency     Past Medical History:  Diagnosis Date  . Allergy   . Arthritis   . Asthma   . CAD (coronary artery disease)    NSTEMI 7/12:  Cardiac cath on 7/17: pLAD occluded, Dx 30-40%, pRCA 30-40%, mRCA 30-40%.  Proximal LAD was treated with a BMS.  Echo 7/19:  EF 60-65%, mild LVH.    ETT-Myoview 6/14:  Inf thinning, no ischemia, EF 64%, normal study  . Cognitive communication deficit   . Colitis   . Diverticulosis   . Duodenitis    May 2012 with heme positive stools at this time. Endorses only rare streaking of blood now.  Marland Kitchen GERD (gastroesophageal reflux disease)   . Heart murmur    as a child per the pt  . Hiatal hernia   . Hx of hemorrhoids   . Hyperlipidemia   . Hypertension   . Kidney stones   . Microadenoma ([place])    MRI in 2008 demonstrating 4.6 mm area of pituitary gland   . Migraines    Has been evaluated multiple times in past for chronic headache in which pt has had occasional nose bleeds and bloodshot eyes. This lasted for 4-5 months. CT of the head was negative in May 2012.  . Myocardial infarction ([place]) 05/17/2011  . Pituitary tumor   . RBBB (right bundle branch block)    Noted on EKG in 2008  . Ulcer      Procedures Performed: 08/04/2018- Dr. Marcelino Scot  1. OPEN REDUCTION INTERNAL FIXATION (ORIF) BICONDYLAR TIBIAL PLATEAU (Left) 2. ANTERIOR COMPARTMENT FASCIOTOMY 3. ASPIRATION OF LEFT KNEE HEMARTHROSIS 4. FLUORO OF LEFT KNEE UNDER STRESS  POST REPAIR   Discharged Condition: good  Hospital Course:   Patient is a 57 year old male sustained a low-energy ground-level fall with resultant left bicondylar tibial plateau fracture while at home.  Patient lives with his brother.  Patient's medical history is complicated by pituitary microadenoma, history of myocardial infarction amongst other medical issues.  Patient admitted to the orthopedic service with isolated tibial plateau fracture.  Orthopedic trauma service was consulted for definitive management.  Patient was seen and evaluated and taken to the OR on 08/04/2018 with the procedure noted above was performed.  Patient tolerated the procedure well.  After surgery he was transferred to the PACU for recovery from anesthesia and then transferred to the orthopedic floor for continued observation, pain control and therapy.  Patient's hospital stay was uncomplicated.  He progressed very well with the therapies.  There was some thought that he may need to go to a skilled nursing center however he progressed very well and was able to discharge home as there was ample help as well as equipment at home.  Given his history of pituitary microadenoma as well as fracture with a ground-level fall we did recheck his labs his prolactin levels were mildly elevated from several months ago at the time of discharge his vitamin D was in the insufficient range.  Testosterone panel was pending at the time  of discharge.  On postoperative day #2 patient was deemed stable for discharge to home.  Pain was well controlled with oral pain medication patient was mobilizing well, tolerating regular diet and able to void without difficulty.  Patient with passing gas as well.  Patient discharged in stable condition on 08/06/2018.  Consults: none  Significant Diagnostic Studies: labs:  Results for KADARIUS, CUFFE (MRN [id number]) as of 08/16/2018 12:09  Ref. Range 08/05/2018 05:14 08/05/2018 09:22 08/06/2018 07:48 08/06/2018 13:54    Sodium Latest Ref Range: 135 - 145 mmol/L 138     Potassium Latest Ref Range: 3.5 - 5.1 mmol/L 4.2     Chloride Latest Ref Range: 98 - 111 mmol/L 102     CO2 Latest Ref Range: 22 - 32 mmol/L 27     Glucose Latest Ref Range: 70 - 99 mg/dL 125 (H)     BUN Latest Ref Range: 6 - 20 mg/dL 15     Creatinine Latest Ref Range: 0.61 - 1.24 mg/dL 0.97     Calcium Latest Ref Range: 8.9 - 10.3 mg/dL 8.7 (L)     Anion gap Latest Ref Range: 5 - 15  9     Phosphorus Latest Ref Range: 2.5 - 4.6 mg/dL 3.9     Magnesium Latest Ref Range: 1.7 - 2.4 mg/dL 2.3     Alkaline Phosphatase Latest Ref Range: 38 - 126 U/L 46     Albumin Latest Ref Range: 3.5 - 5.0 g/dL 3.2 (L)     AST Latest Ref Range: 15 - 41 U/L 74 (H)     ALT Latest Ref Range: 0 - 44 U/L 54 (H)     Total Protein Latest Ref Range: 6.5 - 8.1 g/dL 5.7 (L)     Total Bilirubin Latest Ref Range: 0.3 - 1.2 mg/dL 0.7     GFR, Est Non African American Latest Ref Range: >60 mL/min >60     GFR, Est African American Latest Ref Range: >60 mL/min >60     Vit D, 1,25-Dihydroxy Latest Ref Range: 19.9 - 79.3 pg/mL 60.7     Vitamin D, 25-Hydroxy Latest Ref Range: 30.0 - 100.0 ng/mL 24.9 (L)     WBC Latest Ref Range: 4.0 - 10.5 K/uL 8.3  6.6   RBC Latest Ref Range: 4.22 - 5.81 MIL/uL 3.74 (L)  3.84 (L)   Hemoglobin Latest Ref Range: 13.0 - 17.0 g/dL 12.0 (L)  12.0 (L)   HCT Latest Ref Range: 39.0 - 52.0 % 36.5 (L)  37.2 (L)   MCV Latest Ref Range: 78.0 - 100.0 fL 97.6  96.9   MCH Latest Ref Range: 26.0 - 34.0 pg 32.1  31.3   MCHC Latest Ref Range: 30.0 - 36.0 g/dL 32.9  32.3   RDW Latest Ref Range: 11.5 - 15.5 % 13.5  13.7   Platelets Latest Ref Range: 150 - 400 K/uL 172  174   Neutrophils Latest Units: % 76     Lymphocytes Latest Units: % 15     Monocytes Relative Latest Units: % 9     Eosinophil Latest Units: % 0     Basophil Latest Units: % 0     Immature Granulocytes Latest Units: % 0     NEUT# Latest Ref Range: 1.7 - 7.7 K/uL 6.3     Lymphocyte #  Latest Ref Range: 0.7 - 4.0 K/uL 1.2     Monocyte # Latest Ref Range: 0.1 - 1.0 K/uL 0.8     Eosinophils Absolute  Latest Ref Range: 0.0 - 0.7 K/uL 0.0     Basophils Absolute Latest Ref Range: 0.0 - 0.1 K/uL 0.0     Abs Immature Granulocytes Latest Ref Range: 0.0 - 0.1 K/uL 0.0     LH Latest Ref Range: 1.7 - 8.6 mIU/mL    2.9  FSH Latest Ref Range: 1.5 - 12.4 mIU/mL    4.8  Prolactin Latest Ref Range: 4.0 - 15.2 ng/mL 24.1 (H)     Sex Horm Binding Glob, Serum Latest Ref Range: 19.3 - 76.4 nmol/L    80.8 (H)  Testosterone Latest Ref Range: 264 - 916 ng/dL    209 (L)  Testosterone Free Latest Ref Range: 7.2 - 24.0 pg/mL    3.0 (L)  Testosterone-% Free Latest Ref Range: 0.2 - 0.7 %    0.6  PTH, Intact Latest Ref Range: 15 - 65 pg/mL 25     Calcium, Total (PTH) Latest Ref Range: 8.7 - 10.2 mg/dL 8.6 (L)     PTH Interp Unknown Comment     TSH Latest Ref Range: 0.350 - 4.500 uIU/mL 0.564      Results for ERYC, BODEY (MRN [id number]) as of 08/16/2018 12:09  Ref. Range 08/05/2018 05:14  Vit D, 1,25-Dihydroxy Latest Ref Range: 19.9 - 79.3 pg/mL 60.7  Vitamin D, 25-Hydroxy Latest Ref Range: 30.0 - 100.0 ng/mL 24.9 (L)    Treatments: IV hydration, antibiotics: Ancef, analgesia: acetaminophen, Dilaudid and Noroc, anticoagulation: LMW heparin, therapies: PT, OT and RN and surgery: as above   Discharge Exam:  Orthopedic Trauma Service Progress Note    Patient ID: Michael Pearson MRN: [id number] DOB/AGE: 1961-07-08 56 y.o.   Subjective:   Doing well this Some mild shortness of breath activity only but dissipates quickly No complaints, pain is tolerable Has worked with therapy and is doing better.  Better stability with bariatric walker Ready to go home Patient has all necessary DME at home.  He lives with his brother   Voiding without difficulty      Review of Systems  Constitutional: Negative for chills and fever.  Eyes: Negative for blurred vision.  Respiratory: Negative for shortness of  breath and wheezing.   Cardiovascular: Negative for chest pain and palpitations.  Gastrointestinal: Negative for abdominal pain, nausea and vomiting.  Neurological: Negative for tingling and sensory change.      Objective:    VITALS:         Vitals:    08/05/18 0006 08/05/18 1508 08/05/18 2046 08/06/18 0449  BP: 119/78 139/78 116/70 111/68  Pulse: 92 (!) 104 91 86  Resp: '18 17 16 15  ' Temp: 98.2 F (36.8 C) 98.4 F (36.9 C) 98.6 F (37 C) 98.2 F (36.8 C)  TempSrc: Oral Oral Oral Oral  SpO2: 97% 96% 97% 99%  Weight:          Height:              Estimated body mass index is 36 kg/m as calculated from the following:   Height as of this encounter: '6\' 2"'  (1.88 m).   Weight as of this encounter: 127.2 kg.     Intake/Output      10/05 0701 - 10/06 0700 10/06 0701 - 10/07 0700   P.O. 1200 480   I.V. (mL/kg)     Other     IV Piggyback     Total Intake(mL/kg) 1200 (9.4) 480 (3.8)   Urine (mL/kg/hr) 3700 (1.2) 500 (0.6)   Stool 0 0  Blood     Total Output 3700 500   Net -2500 -20        Stool Occurrence 1 x 1 x      LABS   Lab Results Last 24 Hours       Results for orders placed or performed during the hospital encounter of 08/03/18 (from the past 24 hour(s))  CBC     Status: Abnormal    Collection Time: 08/06/18  7:48 AM  Result Value Ref Range    WBC 6.6 4.0 - 10.5 K/uL    RBC 3.84 (L) 4.22 - 5.81 MIL/uL    Hemoglobin 12.0 (L) 13.0 - 17.0 g/dL    HCT 37.2 (L) 39.0 - 52.0 %    MCV 96.9 78.0 - 100.0 fL    MCH 31.3 26.0 - 34.0 pg    MCHC 32.3 30.0 - 36.0 g/dL    RDW 13.7 11.5 - 15.5 %    Platelets 174 150 - 400 K/uL          PHYSICAL EXAM:    Gen: Awake and alert, no acute distress, sitting in bedside chair.  Appears very comfortable Lungs: Clear to auscultation bilaterally, no adventitious sounds Cardiac: Slightly tachycardic (110's) on exam but regular rate and rhythm Abd: Soft, nontender, nondistended,+ bowel sounds Ext:       Left lower  extremity             Dressings were removed             All incisions look fantastic                         Wounds are healing well                         No signs of infection                         No erythema or active drainage             Swelling is very well controlled             Overall his soft tissue looks very good             DPN, SPN, TN sensory functions are grossly intact             + chronic toe deformities              Ankle extension, flexion, inversion and eversion are intact             + Quad set             + Knee flexion             No deep calf tenderness             Compartments are soft and nontender, no pain with passive stretching                Assessment/Plan: 2 Days Post-Op    Principal Problem:   Closed bicondylar fracture of left tibial plateau Active Problems:   Microadenoma (HCC)   Hypertension   CAD (coronary artery disease)                Anti-infectives (From admission, onward)    Start     Dose/Rate Route Frequency Ordered Stop    08/04/18 1700   ceFAZolin (ANCEF)  IVPB 2g/100 mL premix     2 g 200 mL/hr over 30 Minutes Intravenous Every 6 hours 08/04/18 1550 08/05/18 0930     .   POD/HD#:2   57 y/o male with complex medical history including pituitary tumor with ground level fall and L bicondylar tibial plateau fracture    -L bicondylar tibial plateau fracture             NWB L leg x 6-8 weeks             Unrestricted ROM L knee             Ice and elevate             No pillows under bend of knee while at rest             PT/OT               Dressings changed today.  Can change on a daily basis starting on 08/08/2018             TED hose    - Pain management:             Continue with current regimen    - ABL anemia/Hemodynamics             Stable   - Medical issues              Repeat MRI shows stable pituitary lesion                          Recommend outpt follow up with endocrine regarding worsening hearing  over last several months. ? If related to pituitary tumor. Will relay to PCP, endocrine.                           - DVT/PE prophylaxis:             Lovenox x 4 weeks    - ID:              periop abx completed    - Metabolic Bone Disease:             Labs pending                          Prolactin is elevated at 24.1 ng/mL which is an increase from 2 months ago    - Activity:             OOB with assist              NWB L leg    - FEN/GI prophylaxis/Foley/Lines:             Reg diet      - Impediments to fracture healing:             Endocrinopathy    - Dispo:             Therapies             Dc home today              Follow up with ortho in 2 weeks              Follow up with PCP and endocrine in 10-14 days as well      Disposition: Discharge disposition: 01-Home or Self Care  Discharge Instructions    Call MD / Call 911   Complete by:  As directed    If you experience chest pain or shortness of breath, CALL 911 and be transported to the hospital emergency room.  If you develope a fever above 101 F, pus (white drainage) or increased drainage or redness at the wound, or calf pain, call your surgeon's office.   Constipation Prevention   Complete by:  As directed    Drink plenty of fluids.  Prune juice may be helpful.  You may use a stool softener, such as Colace (over the counter) 100 mg twice a day.  Use MiraLax (over the counter) for constipation as needed.   Diet - low sodium heart healthy   Complete by:  As directed    Discharge instructions   Complete by:  As directed    Orthopaedic Trauma Service Discharge Instructions   General Discharge Instructions  WEIGHT BEARING STATUS: Nonweightbearing left leg  RANGE OF MOTION/ACTIVITY: Unrestricted range of motion right knee and right ankle, do not leave pillow under bend of the knee while at rest.  Place pillow under the ankle to help keep knee in full extension when not working on range of motion.  Please  continue to do exercises that were taught to you by therapy in the hospital.  Do them at least 2 times a day.  Wound Care: Daily wound care starting on 08/08/2018.  Please see instructions below.  Continue to use white compression sock to help with swelling control.  Discharge Wound Care Instructions  Do NOT apply any ointments, solutions or lotions to pin sites or surgical wounds.  These prevent needed drainage and even though solutions like hydrogen peroxide kill bacteria, they also damage cells lining the pin sites that help fight infection.  Applying lotions or ointments can keep the wounds moist and can cause them to breakdown and open up as well. This can increase the risk for infection. When in doubt call the office.  Surgical incisions should be dressed daily.  If any drainage is noted, use one layer of adaptic, then gauze, Kerlix, and an ace wrap.  Once the incision is completely dry and without drainage, it may be left open to air out.  Showering may begin 36-48 hours later.  Cleaning gently with soap and water.  Traumatic wounds should be dressed daily as well.    One layer of adaptic, gauze, Kerlix, then ace wrap.  The adaptic can be discontinued once the draining has ceased    If you have a wet to dry dressing: wet the gauze with saline the squeeze as much saline out so the gauze is moist (not soaking wet), place moistened gauze over wound, then place a dry gauze over the moist one, followed by Kerlix wrap, then ace wrap.   DVT/PE prophylaxis: Lovenox 40 mg subcutaneous injection daily x4 weeks.  (1 injection daily)  Diet: as you were eating previously.  Can use over the counter stool softeners and bowel preparations, such as Miralax, to help with bowel movements.  Narcotics can be constipating.  Be sure to drink plenty of fluids  PAIN MEDICATION USE AND EXPECTATIONS  You have likely been given narcotic medications to help control your pain.  After a traumatic event that results  in an fracture (broken bone) with or without surgery, it is ok to use narcotic pain medications to help control one's pain.  We understand that everyone responds to pain differently and each individual patient will  be evaluated on a regular basis for the continued need for narcotic medications. Ideally, narcotic medication use should last no more than 6-8 weeks (coinciding with fracture healing).   As a patient it is your responsibility as well to monitor narcotic medication use and report the amount and frequency you use these medications when you come to your office visit.   We would also advise that if you are using narcotic medications, you should take a dose prior to therapy to maximize you participation.  IF YOU ARE ON NARCOTIC MEDICATIONS IT IS NOT PERMISSIBLE TO OPERATE A MOTOR VEHICLE (MOTORCYCLE/CAR/TRUCK/MOPED) OR HEAVY MACHINERY DO NOT MIX NARCOTICS WITH OTHER CNS (CENTRAL NERVOUS SYSTEM) DEPRESSANTS SUCH AS ALCOHOL   STOP SMOKING OR USING NICOTINE PRODUCTS!!!!  As discussed nicotine severely impairs your body's ability to heal surgical and traumatic wounds but also impairs bone healing.  Wounds and bone heal by forming microscopic blood vessels (angiogenesis) and nicotine is a vasoconstrictor (essentially, shrinks blood vessels).  Therefore, if vasoconstriction occurs to these microscopic blood vessels they essentially disappear and are unable to deliver necessary nutrients to the healing tissue.  This is one modifiable factor that you can do to dramatically increase your chances of healing your injury.    (This means no smoking, no nicotine gum, patches, etc)  DO NOT USE NONSTEROIDAL ANTI-INFLAMMATORY DRUGS (NSAID'S)  Using products such as Advil (ibuprofen), Aleve (naproxen), Motrin (ibuprofen) for additional pain control during fracture healing can delay and/or prevent the healing response.  If you would like to take over the counter (OTC) medication, Tylenol (acetaminophen) is ok.   However, some narcotic medications that are given for pain control contain acetaminophen as well. Therefore, you should not exceed more than 4000 mg of tylenol in a day if you do not have liver disease.  Also note that there are may OTC medicines, such as cold medicines and allergy medicines that my contain tylenol as well.  If you have any questions about medications and/or interactions please ask your doctor/PA or your pharmacist.      ICE AND ELEVATE INJURED/OPERATIVE EXTREMITY  Using ice and elevating the injured extremity above your heart can help with swelling and pain control.  Icing in a pulsatile fashion, such as 20 minutes on and 20 minutes off, can be followed.    Do not place ice directly on skin. Make sure there is a barrier between to skin and the ice pack.    Using frozen items such as frozen peas works well as the conform nicely to the are that needs to be iced.  USE AN ACE WRAP OR TED HOSE FOR SWELLING CONTROL  In addition to icing and elevation, Ace wraps or TED hose are used to help limit and resolve swelling.  It is recommended to use Ace wraps or TED hose until you are informed to stop.    When using Ace Wraps start the wrapping distally (farthest away from the body) and wrap proximally (closer to the body)   Example: If you had surgery on your leg or thing and you do not have a splint on, start the ace wrap at the toes and work your way up to the thigh        If you had surgery on your upper extremity and do not have a splint on, start the ace wrap at your fingers and work your way up to the upper arm  IF YOU ARE IN A SPLINT OR CAST DO NOT [place]  REASON   If your splint gets wet for any reason please contact the office immediately. You may shower in your splint or cast as long as you keep it dry.  This can be done by wrapping in a cast cover or garbage back (or similar)  Do Not stick any thing down your splint or cast such as pencils, money, or hangers to try and  scratch yourself with.  If you feel itchy take benadryl as prescribed on the bottle for itching  IF YOU ARE IN A CAM BOOT (BLACK BOOT)  You may remove boot periodically. Perform daily dressing changes as noted below.  Wash the liner of the boot regularly and wear a sock when wearing the boot. It is recommended that you sleep in the boot until told otherwise  CALL THE OFFICE WITH ANY QUESTIONS OR CONCERNS: [phone]   Do not put a pillow under the knee. Place it under the heel.   Complete by:  As directed    Driving restrictions   Complete by:  As directed    No driving   Increase activity slowly as tolerated   Complete by:  As directed    Non weight bearing   Complete by:  As directed    Laterality:  left   Extremity:  Lower     Allergies as of 08/06/2018   No Known Allergies     Medication List    STOP taking these medications   benzonatate 100 MG capsule Commonly known as:  TESSALON   mesalamine 1.2 g EC tablet Commonly known as:  LIALDA     TAKE these medications   acetaminophen 500 MG tablet Commonly known as:  TYLENOL Take 1 tablet (500 mg total) by mouth every 12 (twelve) hours.   albuterol 108 (90 Base) MCG/ACT inhaler Commonly known as:  PROVENTIL HFA;VENTOLIN HFA Inhale 2 puffs into the lungs every 4 (four) hours as needed for wheezing or shortness of breath.   aspirin 81 MG tablet Take 81 mg by mouth daily.   atorvastatin 40 MG tablet Commonly known as:  LIPITOR Take 1 tablet (40 mg total) by mouth daily.   bromocriptine 2.5 MG tablet Commonly known as:  PARLODEL Take 1 tablet (2.5 mg total) by mouth 2 (two) times daily. What changed:  when to take this   cetirizine 10 MG tablet Commonly known as:  ZYRTEC Take 10 mg by mouth daily as needed for allergies.   docusate sodium 100 MG capsule Commonly known as:  COLACE Take 1 capsule (100 mg total) by mouth 2 (two) times daily.   enoxaparin 40 MG/0.4ML injection Commonly known as:  LOVENOX Inject  0.4 mLs (40 mg total) into the skin daily. Start taking on:  08/07/2018   enoxaparin Kit Commonly known as:  LOVENOX 1 kit by Does not apply route once for 1 dose.   ezetimibe 10 MG tablet Commonly known as:  ZETIA TAKE ONE TABLET BY MOUTH ONE TIME DAILY   HYDROcodone-acetaminophen 7.5-325 MG tablet Commonly known as:  NORCO Take 1-2 tablets by mouth every 6 (six) hours as needed for moderate pain or severe pain.   losartan 50 MG tablet Commonly known as:  COZAAR Take 1 tablet (50 mg total) by mouth daily.   MENS ONE DAILY PO Take 1 tablet by mouth daily.   methocarbamol 500 MG tablet Commonly known as:  ROBAXIN Take 1 tablet (500 mg total) by mouth every 8 (eight) hours as needed for muscle spasms.   metoprolol tartrate  25 MG tablet Commonly known as:  LOPRESSOR TAKE HALF TABLET BY MOUTH TWICE DAILY What changed:  See the new instructions.   mometasone 50 MCG/ACT nasal spray Commonly known as:  NASONEX Place 2 sprays into the nose daily. What changed:    when to take this  reasons to take this   omeprazole 40 MG capsule Commonly known as:  PRILOSEC Take 1 capsule (40 mg total) by mouth daily. 30 minutes before breakfast What changed:  when to take this            Discharge Care Instructions  (From admission, onward)         Start     Ordered   08/06/18 0000  Non weight bearing    Question Answer Comment  Laterality left   Extremity Lower      08/06/18 1705         Follow-up Information    [place], MD. Schedule an appointment as soon as possible for a visit in 14 day(s).   Specialty:  Orthopedic Surgery Contact information: [place] [address] [phone]        [name], DO. Schedule an appointment as soon as possible for a visit in 10 day(s).   Specialty:  Family Medicine Contact information: [phone] [place] [address] [phone]           Discharge  Instructions and Plan:    57 y/o male with complex medical history including pituitary tumor with ground level fall and L bicondylar tibial plateau fracture    -L bicondylar tibial plateau fracture             NWB L leg x 6-8 weeks             Unrestricted ROM L knee             Ice and elevate             No pillows under bend of knee while at rest             PT/OT               Dressings changed today.  Can change on a daily basis starting on 08/08/2018             TED hose    - Pain management:             Continue with current regimen   Tylenol and norco    - Medical issues              Repeat MRI shows stable pituitary lesion                          Recommend outpt follow up with endocrine regarding worsening hearing over last several months. ? If related to pituitary tumor. Will relay to PCP, endocrine.                           - DVT/PE prophylaxis:             Lovenox x 4 weeks    - Metabolic Bone Disease:             Labs pending- vitamin d and testosterone panel  Prolactin is elevated at 24.1 ng/mL which is an increase from 2 months ago    - Activity:             OOB with assist              NWB L leg    - FEN/GI prophylaxis/Foley/Lines:             Reg diet      - Impediments to fracture healing:             Endocrinopathy    - Dispo:             Therapies             Dc home today              Follow up with ortho in 2 weeks              Follow up with PCP and endocrine in 10-14 days as well   Signed:  Jari Pigg, PA-C [facility] [phone] (P) 08/06/2018, 5:05 PM

## 2018-08-07 ENCOUNTER — Encounter (HOSPITAL_COMMUNITY): Payer: Self-pay | Admitting: Orthopedic Surgery

## 2018-08-07 LAB — SEX HORMONE BINDING GLOBULIN: SEX HORMONE BINDING: 80.8 nmol/L — AB (ref 19.3–76.4)

## 2018-08-07 LAB — VITAMIN D 25 HYDROXY (VIT D DEFICIENCY, FRACTURES): VIT D 25 HYDROXY: 24.9 ng/mL — AB (ref 30.0–100.0)

## 2018-08-07 LAB — FOLLICLE STIMULATING HORMONE: FSH: 4.8 m[IU]/mL (ref 1.5–12.4)

## 2018-08-07 LAB — LUTEINIZING HORMONE: LH: 2.9 m[IU]/mL (ref 1.7–8.6)

## 2018-08-07 LAB — TESTOSTERONE: TESTOSTERONE: 209 ng/dL — AB (ref 264–916)

## 2018-08-07 LAB — CALCITRIOL (1,25 DI-OH VIT D): VIT D 1 25 DIHYDROXY: 60.7 pg/mL (ref 19.9–79.3)

## 2018-08-08 LAB — TESTOSTERONE, FREE: TESTOSTERONE FREE: 3 pg/mL — AB (ref 7.2–24.0)

## 2018-08-09 ENCOUNTER — Telehealth: Payer: Self-pay

## 2018-08-09 NOTE — Telephone Encounter (Signed)
Spoke with patients payee who states patient will be following up with Orthopedics and Endocrinology for hospital follow up. States patient does not need to see Dr. Carollee Herter at this time. WIll keep appointment scheduled in January. Advised to call office if needed. Payee agreed.

## 2018-08-13 LAB — TESTOSTERONE, % FREE: TESTOSTERONE-% FREE: 0.6 % (ref 0.2–0.7)

## 2018-08-14 ENCOUNTER — Telehealth: Payer: Self-pay | Admitting: Family Medicine

## 2018-08-14 DIAGNOSIS — R748 Abnormal levels of other serum enzymes: Secondary | ICD-10-CM

## 2018-08-14 NOTE — Telephone Encounter (Signed)
Copied from [place] [id number]. 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

## 2018-08-15 NOTE — Telephone Encounter (Signed)
Ok to wait on labs but liver function still elevated---- we need [place] abd  Due to elevated liver function

## 2018-08-15 NOTE — Telephone Encounter (Signed)
Patient friend will be calling surgeons office about bandages.  Also he was put in for hospital f/u for leg surgery, are you ok with canceling he has follow up appt scheduled with surgeon.  Lastly he wanted to know if he can wait til his appointment to do his lab work at that visit instead of 08/30/18.  Patient money is limited.

## 2018-08-16 NOTE — Telephone Encounter (Signed)
Chief Strategy Officer spoke to [place], brother. At this point, Marden Noble wants to know when to do labwork, (no recent orders seen?) and U/S abdomen as recommended. Pt. is self-pay but is going to have medicare in the new year, and needs to know what can wait until then. Pt. Has 1/27 visit with Dr. Etter Sjogren and 1/8 visit with Dr. Loanne Drilling. Appointment for 10/21 with Dr. Etter Sjogren cancelled per [name]'s request. Routed to Dr. Etter Sjogren to advise.

## 2018-08-16 NOTE — Telephone Encounter (Signed)
If unable to do [place] ------ will wait until Jan

## 2018-08-17 NOTE — Telephone Encounter (Signed)
Author phoned doug, who stated that he would return imaging's phone call and schedule for after jan 1st, prior to 1/27 appointment with Dr. Etter Sjogren. Michael Pearson is aware of option to go to [facility] imaging for less expensive pricing if he chooses to have the pt's U/S performed under self-pay. No other concerns at this time.

## 2018-08-21 ENCOUNTER — Inpatient Hospital Stay: Payer: Self-pay | Admitting: Family Medicine

## 2018-08-23 ENCOUNTER — Ambulatory Visit: Payer: Self-pay | Admitting: Endocrinology

## 2018-08-30 ENCOUNTER — Other Ambulatory Visit: Payer: Self-pay

## 2018-09-06 ENCOUNTER — Other Ambulatory Visit: Payer: Self-pay

## 2018-09-11 ENCOUNTER — Other Ambulatory Visit (INDEPENDENT_AMBULATORY_CARE_PROVIDER_SITE_OTHER): Payer: Self-pay

## 2018-09-11 DIAGNOSIS — E785 Hyperlipidemia, unspecified: Secondary | ICD-10-CM

## 2018-09-11 DIAGNOSIS — R748 Abnormal levels of other serum enzymes: Secondary | ICD-10-CM

## 2018-09-12 ENCOUNTER — Encounter: Payer: Self-pay | Admitting: Internal Medicine

## 2018-09-12 LAB — COMPREHENSIVE METABOLIC PANEL
ALBUMIN: 4.4 g/dL (ref 3.5–5.2)
ALT: 53 U/L (ref 0–53)
AST: 41 U/L — AB (ref 0–37)
Alkaline Phosphatase: 75 U/L (ref 39–117)
BILIRUBIN TOTAL: 0.7 mg/dL (ref 0.2–1.2)
BUN: 12 mg/dL (ref 6–23)
CALCIUM: 10.1 mg/dL (ref 8.4–10.5)
CHLORIDE: 103 meq/L (ref 96–112)
CO2: 28 mEq/L (ref 19–32)
CREATININE: 0.91 mg/dL (ref 0.40–1.50)
GFR: 91.04 mL/min (ref >60.00)
Glucose, Bld: 94 mg/dL (ref 70–99)
Potassium: 4.2 mEq/L (ref 3.5–5.1)
SODIUM: 141 meq/L (ref 135–145)
Total Protein: 7 g/dL (ref 6.0–8.3)

## 2018-09-12 LAB — LIPID PANEL
CHOLESTEROL: 111 mg/dL (ref 0–200)
HDL: 56.3 mg/dL (ref >39.00)
LDL CALC: 42 mg/dL (ref 0–99)
NonHDL: 54.98
Total CHOL/HDL Ratio: 2
Triglycerides: 66 mg/dL (ref 0.0–149.0)
VLDL: 13.2 mg/dL (ref 0.0–40.0)

## 2018-10-30 ENCOUNTER — Other Ambulatory Visit: Payer: Self-pay | Admitting: Family Medicine

## 2018-10-30 DIAGNOSIS — E785 Hyperlipidemia, unspecified: Secondary | ICD-10-CM

## 2018-10-30 DIAGNOSIS — I2510 Atherosclerotic heart disease of native coronary artery without angina pectoris: Secondary | ICD-10-CM

## 2018-10-30 DIAGNOSIS — I10 Essential (primary) hypertension: Secondary | ICD-10-CM

## 2018-11-08 ENCOUNTER — Ambulatory Visit (INDEPENDENT_AMBULATORY_CARE_PROVIDER_SITE_OTHER): Payer: Medicare Other | Admitting: Endocrinology

## 2018-11-08 ENCOUNTER — Encounter: Payer: Self-pay | Admitting: Endocrinology

## 2018-11-08 VITALS — BP 138/98 | HR 93 | Ht 74.0 in | Wt 294.2 lb

## 2018-11-08 DIAGNOSIS — E221 Hyperprolactinemia: Secondary | ICD-10-CM

## 2018-11-08 NOTE — Progress Notes (Signed)
Subjective:    Patient ID: Michael Pearson, male    DOB: [birth year], 58 y.o.   MRN: [id number]  HPI Pt returns for f/u of pituitary microprolactinoma (dx'ed 2005, adenoma was incidentally noted to have a pituitary adenoma; in 2017, Dr Etter Sjogren found moderate elevated prolactin; a friend Marden Noble Early, helps, and is helping pt pursue disability); f/u MRI in 2019 showed slight interval growth; h/o near-syncope limits bromocriptine dosage; other pituitary functions are normal).  He takes parlodel just QD.  Denies nausea.   Past Medical History:  Diagnosis Date  . Allergy   . Arthritis   . Asthma   . CAD (coronary artery disease)    NSTEMI 7/12:  Cardiac cath on 7/17: pLAD occluded, Dx 30-40%, pRCA 30-40%, mRCA 30-40%.  Proximal LAD was treated with a BMS.  Echo 7/19:  EF 60-65%, mild LVH.    ETT-Myoview 6/14:  Inf thinning, no ischemia, EF 64%, normal study  . Cognitive communication deficit   . Colitis   . Diverticulosis   . Duodenitis    May 2012 with heme positive stools at this time. Endorses only rare streaking of blood now.  Marland Kitchen GERD (gastroesophageal reflux disease)   . Heart murmur    as a child per the pt  . Hiatal hernia   . Hx of hemorrhoids   . Hyperlipidemia   . Hypertension   . Kidney stones   . Microadenoma ([place])    MRI in 2008 demonstrating 4.6 mm area of pituitary gland   . Migraines    Has been evaluated multiple times in past for chronic headache in which pt has had occasional nose bleeds and bloodshot eyes. This lasted for 4-5 months. CT of the head was negative in May 2012.  . Myocardial infarction ([place]) 05/17/2011  . Pituitary tumor   . RBBB (right bundle branch block)    Noted on EKG in 2008  . Ulcer     Past Surgical History:  Procedure Laterality Date  . COLONOSCOPY    . egd  03/05/2011   Dr. Benson [place]  . FLEXIBLE SIGMOIDOSCOPY  03/05/2011   Dr. Benson [place]  . HAMMER TOE SURGERY    . Heart stent    . HERNIA REPAIR     In 20's  . ORIF TIBIA PLATEAU Left 08/04/2018   Procedure: OPEN REDUCTION INTERNAL FIXATION (ORIF) TIBIAL PLATEAU;  Surgeon: Altamese Buckingham, MD;  Location: [facility];  Service: Orthopedics;  Laterality: Left;  . SIGMOIDOSCOPY    . TOOTH EXTRACTION    . UPPER GASTROINTESTINAL ENDOSCOPY      Social History   Socioeconomic History  . Marital status: Single    Spouse name: Not on file  . Number of children: Not on file  . Years of education: Not on file  . Highest education level: Not on file  Occupational History  . Occupation: Personnel officer-- cleans 3 buildings    Comment: Fairly physical job    Employer: [employer]  Social Needs  . Financial resource strain: Not on file  . Food insecurity:    Worry: Not on file    Inability: Not on file  . Transportation needs:    Medical: Not on file    Non-medical: Not on file  Tobacco Use  . Smoking status: Never Smoker  . Smokeless tobacco: Never Used  Substance and Sexual Activity  . Alcohol use: No  . Drug use: No  . Sexual activity: Not Currently  Lifestyle  . Physical activity:  Days per week: Not on file    Minutes per session: Not on file  . Stress: Not on file  Relationships  . Social connections:    Talks on phone: Not on file    Gets together: Not on file    Attends religious service: Not on file    Active member of club or organization: Not on file    Attends meetings of clubs or organizations: Not on file    Relationship status: Not on file  . Intimate partner violence:    Fear of current or ex partner: Not on file    Emotionally abused: Not on file    Physically abused: Not on file    Forced sexual activity: Not on file  Other Topics Concern  . Not on file  Social History Narrative   Lives with brother and mother   Mother is quite sick, he and his brother take turns taking care of her   No children    Current Outpatient Medications on File Prior to Visit  Medication Sig Dispense Refill  . acetaminophen (TYLENOL) 500 MG tablet Take 1 tablet (500 mg  total) by mouth every 12 (twelve) hours. 60 tablet 0  . albuterol (PROVENTIL HFA;VENTOLIN HFA) 108 (90 Base) MCG/ACT inhaler Inhale 2 puffs into the lungs every 4 (four) hours as needed for wheezing or shortness of breath. 1 Inhaler 2  . aspirin 81 MG tablet Take 81 mg by mouth daily.      Marland Kitchen atorvastatin (LIPITOR) 40 MG tablet TAKE ONE TABLET BY MOUTH ONE TIME DAILY  90 tablet 1  . cetirizine (ZYRTEC) 10 MG tablet Take 10 mg by mouth daily as needed for allergies.     Marland Kitchen docusate sodium (COLACE) 100 MG capsule Take 1 capsule (100 mg total) by mouth 2 (two) times daily. 20 capsule 0  . enoxaparin (LOVENOX) 40 MG/0.4ML injection Inject 0.4 mLs (40 mg total) into the skin daily. 30 Syringe 0  . ezetimibe (ZETIA) 10 MG tablet TAKE ONE TABLET BY MOUTH ONE TIME DAILY  90 tablet 1  . HYDROcodone-acetaminophen (NORCO) 7.5-325 MG tablet Take 1-2 tablets by mouth every 6 (six) hours as needed for moderate pain or severe pain. 50 tablet 0  . losartan (COZAAR) 50 MG tablet TAKE ONE TABLET BY MOUTH ONE TIME DAILY  90 tablet 1  . methocarbamol (ROBAXIN) 500 MG tablet Take 1 tablet (500 mg total) by mouth every 8 (eight) hours as needed for muscle spasms. 60 tablet 0  . metoprolol tartrate (LOPRESSOR) 25 MG tablet TAKE HALF TABLET BY MOUTH TWICE DAILY  90 tablet 1  . mometasone (NASONEX) 50 MCG/ACT nasal spray Place 2 sprays into the nose daily. (Patient taking differently: Place 2 sprays into the nose daily as needed (congestion). ) 17 g 2  . Multiple Vitamins-Minerals (MENS ONE DAILY PO) Take 1 tablet by mouth daily.    Marland Kitchen omeprazole (PRILOSEC) 40 MG capsule Take 1 capsule (40 mg total) by mouth daily. 30 minutes before breakfast (Patient taking differently: Take 40 mg by mouth at bedtime. 30 minutes before breakfast) 30 capsule 3   No current facility-administered medications on file prior to visit.     No Known Allergies  Family History  Problem Relation Age of Onset  . Stroke Mother   . Heart attack  Father 65       MI  . Skin cancer Father   . Cancer Father        ? type "rare"  .  Obesity Brother   . Coronary artery disease Brother        Possible, pt not sure of specifics  . Hypertension Brother   . Colon polyps Maternal Aunt   . Colon cancer Maternal Aunt   . Colon polyps Paternal Grandmother   . Other Neg Hx   . Esophageal cancer Neg Hx   . Rectal cancer Neg Hx   . Stomach cancer Neg Hx     BP (!) 138/98 (BP Location: Right Arm, Patient Position: Sitting, Cuff Size: Normal)   Pulse 93   Ht 6\' 2"  (1.88 m)   Wt 294 lb 3.2 oz (133.4 kg)   SpO2 97%   BMI 37.77 kg/m   Review of Systems Denies headache.      Objective:   Physical Exam VITAL SIGNS:  See vs page GENERAL: no distress NECK: There is no palpable thyroid enlargement.  No thyroid nodule is palpable.  No palpable lymphadenopathy at the anterior neck.  Lab Results  Component Value Date   TSH 0.564 08/05/2018       Assessment & Plan:  HTN: is noted today.  Hyperprolactinemia: due for recheck.    Patient Instructions  Your blood pressure is high today.  Please see your primary care provider soon, to have it rechecked blood tests are requested for you today.  We'll let you know about the results.  If it is high, we'll increase the bromocriptine to twice a day.   When the blood test is normal, we'll recheck the MRI.   Please come back for a follow-up appointment in 2 months.

## 2018-11-08 NOTE — Patient Instructions (Addendum)
Your blood pressure is high today.  Please see your primary care provider soon, to have it rechecked blood tests are requested for you today.  We'll let you know about the results.  If it is high, we'll increase the bromocriptine to twice a day.   When the blood test is normal, we'll recheck the MRI.   Please come back for a follow-up appointment in 2 months.

## 2018-11-09 LAB — PROLACTIN: PROLACTIN: 24.2 ng/mL — AB (ref 2.0–18.0)

## 2018-11-09 MED ORDER — BROMOCRIPTINE MESYLATE 2.5 MG PO TABS: 2.5000 mg | ORAL_TABLET | Freq: Two times a day (BID) | ORAL | 2 refills | Status: DC

## 2018-11-22 ENCOUNTER — Ambulatory Visit (INDEPENDENT_AMBULATORY_CARE_PROVIDER_SITE_OTHER): Payer: Medicare Other

## 2018-11-22 DIAGNOSIS — K769 Liver disease, unspecified: Secondary | ICD-10-CM | POA: Diagnosis not present

## 2018-11-22 DIAGNOSIS — R748 Abnormal levels of other serum enzymes: Secondary | ICD-10-CM

## 2018-11-22 DIAGNOSIS — K7689 Other specified diseases of liver: Secondary | ICD-10-CM | POA: Diagnosis not present

## 2018-11-23 ENCOUNTER — Other Ambulatory Visit: Payer: Self-pay | Admitting: Family Medicine

## 2018-11-23 DIAGNOSIS — R748 Abnormal levels of other serum enzymes: Secondary | ICD-10-CM

## 2018-11-27 ENCOUNTER — Ambulatory Visit (INDEPENDENT_AMBULATORY_CARE_PROVIDER_SITE_OTHER): Payer: Medicare Other | Admitting: Family Medicine

## 2018-11-27 ENCOUNTER — Encounter: Payer: Self-pay | Admitting: Family Medicine

## 2018-11-27 VITALS — BP 110/74 | HR 66 | Resp 16 | Ht 74.0 in | Wt 293.0 lb

## 2018-11-27 DIAGNOSIS — S82142A Displaced bicondylar fracture of left tibia, initial encounter for closed fracture: Secondary | ICD-10-CM

## 2018-11-27 DIAGNOSIS — I214 Non-ST elevation (NSTEMI) myocardial infarction: Secondary | ICD-10-CM | POA: Diagnosis not present

## 2018-11-27 DIAGNOSIS — E221 Hyperprolactinemia: Secondary | ICD-10-CM

## 2018-11-27 DIAGNOSIS — E785 Hyperlipidemia, unspecified: Secondary | ICD-10-CM | POA: Diagnosis not present

## 2018-11-27 DIAGNOSIS — I10 Essential (primary) hypertension: Secondary | ICD-10-CM

## 2018-11-27 LAB — LIPID PANEL
CHOL/HDL RATIO: 2
Cholesterol: 108 mg/dL (ref 0–200)
HDL: 52.1 mg/dL (ref >39.00)
LDL Cholesterol: 42 mg/dL (ref 0–99)
NONHDL: 55.6
Triglycerides: 70 mg/dL (ref 0.0–149.0)
VLDL: 14 mg/dL (ref 0.0–40.0)

## 2018-11-27 LAB — COMPREHENSIVE METABOLIC PANEL
ALK PHOS: 92 U/L (ref 39–117)
ALT: 60 U/L — AB (ref 0–53)
AST: 48 U/L — ABNORMAL HIGH (ref 0–37)
Albumin: 4.4 g/dL (ref 3.5–5.2)
BILIRUBIN TOTAL: 0.6 mg/dL (ref 0.2–1.2)
BUN: 10 mg/dL (ref 6–23)
CO2: 25 mEq/L (ref 19–32)
Calcium: 10.1 mg/dL (ref 8.4–10.5)
Chloride: 105 mEq/L (ref 96–112)
Creatinine, Ser: 0.79 mg/dL (ref 0.40–1.50)
GFR: 100.77 mL/min (ref >60.00)
GLUCOSE: 108 mg/dL — AB (ref 70–99)
POTASSIUM: 4.4 meq/L (ref 3.5–5.1)
Sodium: 140 mEq/L (ref 135–145)
Total Protein: 7 g/dL (ref 6.0–8.3)

## 2018-11-27 NOTE — Progress Notes (Signed)
Patient ID: Michael Pearson, male    DOB: 1961/09/25  Age: 58 y.o. MRN: [id number]    Subjective:  Subjective  HPI ARTEM BUNTE presents for chol and bp check  He is recovering from closed fx L tibia in Oct.  He is still struggling to move around and now has medicare / medicaid ---he sees the ortho   Review of Systems  Constitutional: Negative for appetite change, diaphoresis, fatigue and unexpected weight change.  Eyes: Negative for pain, redness and visual disturbance.  Respiratory: Negative for cough, chest tightness, shortness of breath and wheezing.   Cardiovascular: Negative for chest pain, palpitations and leg swelling.  Endocrine: Negative for cold intolerance, heat intolerance, polydipsia, polyphagia and polyuria.  Genitourinary: Negative for difficulty urinating, dysuria and frequency.  Musculoskeletal: Positive for gait problem.  Neurological: Negative for dizziness, light-headedness, numbness and headaches.    History Past Medical History:  Diagnosis Date  . Allergy   . Arthritis   . Asthma   . CAD (coronary artery disease)    NSTEMI 7/12:  Cardiac cath on 7/17: pLAD occluded, Dx 30-40%, pRCA 30-40%, mRCA 30-40%.  Proximal LAD was treated with a BMS.  Echo 7/19:  EF 60-65%, mild LVH.    ETT-Myoview 6/14:  Inf thinning, no ischemia, EF 64%, normal study  . Cognitive communication deficit   . Colitis   . Diverticulosis   . Duodenitis    May 2012 with heme positive stools at this time. Endorses only rare streaking of blood now.  Marland Kitchen GERD (gastroesophageal reflux disease)   . Heart murmur    as a child per the pt  . Hiatal hernia   . Hx of hemorrhoids   . Hyperlipidemia   . Hypertension   . Kidney stones   . Microadenoma ([place])    MRI in 2008 demonstrating 4.6 mm area of pituitary gland   . Migraines    Has been evaluated multiple times in past for chronic headache in which pt has had occasional nose bleeds and bloodshot eyes. This lasted for 4-5 months. CT of the head was  negative in May 2012.  . Myocardial infarction ([place]) 05/17/2011  . Pituitary tumor   . RBBB (right bundle branch block)    Noted on EKG in 2008  . Ulcer     He has a past surgical history that includes Hernia repair; Tooth extraction; Hammer toe surgery; egd (03/05/2011); Flexible sigmoidoscopy (03/05/2011); Heart stent; Upper gastrointestinal endoscopy; Colonoscopy; Sigmoidoscopy; and ORIF tibia plateau (Left, 08/04/2018).   His family history includes Cancer in his father; Colon cancer in his maternal aunt; Colon polyps in his maternal aunt and paternal grandmother; Coronary artery disease in his brother; Heart attack (age of onset: 80) in his father; Hypertension in his brother; Obesity in his brother; Skin cancer in his father; Stroke in his mother.He reports that he has never smoked. He has never used smokeless tobacco. He reports that he does not drink alcohol or use drugs.  Current Outpatient Medications on File Prior to Visit  Medication Sig Dispense Refill  . acetaminophen (TYLENOL) 500 MG tablet Take 1 tablet (500 mg total) by mouth every 12 (twelve) hours. 60 tablet 0  . albuterol (PROVENTIL HFA;VENTOLIN HFA) 108 (90 Base) MCG/ACT inhaler Inhale 2 puffs into the lungs every 4 (four) hours as needed for wheezing or shortness of breath. 1 Inhaler 2  . aspirin 81 MG tablet Take 81 mg by mouth daily.      Marland Kitchen atorvastatin (LIPITOR) 40 MG  tablet TAKE ONE TABLET BY MOUTH ONE TIME DAILY  90 tablet 1  . bromocriptine (PARLODEL) 2.5 MG tablet Take 1 tablet (2.5 mg total) by mouth 2 (two) times daily. 180 tablet 2  . cetirizine (ZYRTEC) 10 MG tablet Take 10 mg by mouth daily as needed for allergies.     Marland Kitchen docusate sodium (COLACE) 100 MG capsule Take 1 capsule (100 mg total) by mouth 2 (two) times daily. 20 capsule 0  . enoxaparin (LOVENOX) 40 MG/0.4ML injection Inject 0.4 mLs (40 mg total) into the skin daily. 30 Syringe 0  . ezetimibe (ZETIA) 10 MG tablet TAKE ONE TABLET BY MOUTH ONE TIME DAILY  90  tablet 1  . HYDROcodone-acetaminophen (NORCO) 7.5-325 MG tablet Take 1-2 tablets by mouth every 6 (six) hours as needed for moderate pain or severe pain. 50 tablet 0  . losartan (COZAAR) 50 MG tablet TAKE ONE TABLET BY MOUTH ONE TIME DAILY  90 tablet 1  . methocarbamol (ROBAXIN) 500 MG tablet Take 1 tablet (500 mg total) by mouth every 8 (eight) hours as needed for muscle spasms. 60 tablet 0  . metoprolol tartrate (LOPRESSOR) 25 MG tablet TAKE HALF TABLET BY MOUTH TWICE DAILY  90 tablet 1  . mometasone (NASONEX) 50 MCG/ACT nasal spray Place 2 sprays into the nose daily. (Patient taking differently: Place 2 sprays into the nose daily as needed (congestion). ) 17 g 2  . Multiple Vitamins-Minerals (MENS ONE DAILY PO) Take 1 tablet by mouth daily.    Marland Kitchen omeprazole (PRILOSEC) 40 MG capsule Take 1 capsule (40 mg total) by mouth daily. 30 minutes before breakfast (Patient taking differently: Take 40 mg by mouth at bedtime. 30 minutes before breakfast) 30 capsule 3   No current facility-administered medications on file prior to visit.      Objective:  Objective  Physical Exam Vitals signs and nursing note reviewed.  Constitutional:      General: He is sleeping.     Appearance: He is well-developed.  HENT:     Head: Normocephalic and atraumatic.  Eyes:     Pupils: Pupils are equal, round, and reactive to light.  Neck:     Musculoskeletal: Normal range of motion and neck supple.     Thyroid: No thyromegaly.  Cardiovascular:     Rate and Rhythm: Normal rate and regular rhythm.     Heart sounds: No murmur.  Pulmonary:     Effort: Pulmonary effort is normal. No respiratory distress.     Breath sounds: Normal breath sounds. No wheezing or rales.  Chest:     Chest wall: No tenderness.  Musculoskeletal:        General: No tenderness.  Skin:    General: Skin is warm and dry.  Neurological:     Mental Status: He is oriented to person, place, and time.  Psychiatric:        Mood and Affect: Mood  and affect normal.        Behavior: Behavior normal.        Thought Content: Thought content normal.        Judgment: Judgment normal.    BP 110/74 (BP Location: Right Arm, Patient Position: Sitting, Cuff Size: Large)   Pulse 66   Resp 16   Ht 6\' 2"  (1.88 m)   Wt 293 lb (132.9 kg)   SpO2 99%   BMI 37.62 kg/m  Wt Readings from Last 3 Encounters:  11/27/18 293 lb (132.9 kg)  11/08/18 294 lb 3.2 oz (  133.4 kg)  08/04/18 280 lb 6.8 oz (127.2 kg)     Lab Results  Component Value Date   WBC 6.6 08/06/2018   HGB 12.0 (L) 08/06/2018   HCT 37.2 (L) 08/06/2018   PLT 174 08/06/2018   GLUCOSE 94 09/11/2018   CHOL 111 09/11/2018   TRIG 66.0 09/11/2018   HDL 56.30 09/11/2018   LDLCALC 42 09/11/2018   ALT 53 09/11/2018   AST 41 (H) 09/11/2018   NA 141 09/11/2018   K 4.2 09/11/2018   CL 103 09/11/2018   CREATININE 0.91 09/11/2018   BUN 12 09/11/2018   CO2 28 09/11/2018   TSH 0.564 08/05/2018   PSA 0.33 11/23/2016   INR 0.97 05/17/2011   HGBA1C 5.8 09/17/2014   MICROALBUR 1.5 11/20/2015    Dg Tibia/fibula Left  Result Date: 08/04/2018 CLINICAL DATA:  Tibial plateau fracture EXAM: LEFT TIBIA AND FIBULA - 2 VIEW; DG C-ARM 61-120 MIN COMPARISON:  CT 08/03/2018, radiograph 08/03/2018 FINDINGS: Eight low resolution intraoperative spot views of the left tibia and fibula. Total fluoroscopy time was 52 seconds. Images were obtained during operative placement of medial and lateral surgical plate and multiple fixating screws across proximal tibial fracture. Anatomic alignment on the images submitted. IMPRESSION: Intraoperative fluoroscopic assistance provided during surgical fixation of proximal tibia fracture Electronically Signed   By: Donavan Foil M.D.   On: 08/04/2018 14:14   Mr Jeri Cos ZO Contrast  Result Date: 08/04/2018 CLINICAL DATA:  Follow-up pituitary tumor. EXAM: MRI HEAD WITHOUT AND WITH CONTRAST TECHNIQUE: Multiplanar, multiecho pulse sequences of the brain and surrounding  structures were obtained without and with intravenous contrast. CONTRAST:  5 mL Gadavist COMPARISON:  06/19/2016 FINDINGS: Brain: There is no evidence of acute infarct, intracranial hemorrhage, midline shift, or extra-axial fluid collection. The ventricles and sulci are within normal limits for age. No significant white matter disease is evident. Dedicated pituitary imaging was performed and is mildly to moderately motion degraded. A hypoenhancing mass is again seen in the right aspect of the pituitary gland, however precise measurement is limited by motion. The lesion appears to have mildly decreased in size with the superior margin of the gland now being essentially flat where as it was convex before. The mass is estimated to measure 8 x 6 mm (transverse x craniocaudal) on coronal dynamic post-contrast imaging (series 1102, image 3, previously 8 x 9 mm when measured similarly). No definite cavernous sinus invasion is currently evident. The infundibulum is mildly deviated leftward. The optic chiasm is unremarkable. Vascular: Major intracranial vascular flow voids are preserved. Skull and upper cervical spine: Unremarkable bone marrow signal. Sinuses/Orbits: Unremarkable orbits. Paranasal sinuses and mastoid air cells are clear. Other: None. IMPRESSION: 1. Motion degraded examination with slight decrease in size of pituitary adenoma, now 8 mm. 2. Otherwise unremarkable appearance of the brain. Electronically Signed   By: Logan Bores M.D.   On: 08/04/2018 19:29   Ct Tibia Fibula Left Wo Contrast  Result Date: 08/03/2018 CLINICAL DATA:  Left lower leg injury secondary to a trip and fall today while walking. Initial encounter. EXAM: CT OF THE LOWER LEFT EXTREMITY WITHOUT CONTRAST TECHNIQUE: Multidetector CT imaging of the lower left extremity was performed according to the standard protocol. COMPARISON:  Plain films left lower leg this same scratch the plain films left knee this same day. FINDINGS:  Bones/Joint/Cartilage As seen on the comparison plain films, the patient has a nondisplaced fracture of the proximal tibia. The main component of the fracture is obliquely transverse  in orientation originating in the lateral metaphysis 5 cm below the joint line and extending inferiorly through the medial metaphysis approximately 10 cm below the joint line. A longitudinal fracture line extends cephalad through the medial tibial eminence. The tibial plateaus are preserved. No other fracture is identified. Ligaments Suboptimally assessed by CT. The cruciate and collateral ligaments appear intact. Muscles and Tendons Intact and normal in appearance. Soft tissues Small lipohemarthrosis is noted. IMPRESSION: Nondisplaced fracture of the proximal metaphysis and diaphysis of the tibia includes a longitudinal component extending cephalad through the medial tibial eminence. The tibial plateaus are intact. No other fracture is identified. Electronically Signed   By: Inge Rise M.D.   On: 08/03/2018 16:05   Dg Chest Port 1 View  Result Date: 08/04/2018 CLINICAL DATA:  Tachypnea. EXAM: PORTABLE CHEST 1 VIEW COMPARISON:  01/21/2018.  05/17/2011. FINDINGS: Mild mediastinal and right hilar fullness, this is most likely vascular. Upright PA lateral chest x-ray suggested for further evaluation. Mild bibasilar atelectasis. No pleural effusion or pneumothorax. Heart size normal. IMPRESSION: 1. Mild mediastinal and right hilar fullness. This is most likely vascular related to portable technique. PA lateral chest x-ray suggested for further evaluation. 2.  Mild bibasilar subsegmental atelectasis. Electronically Signed   By: Marcello Moores  Register   On: 08/04/2018 14:09   Dg Knee Complete 4 Views Left  Result Date: 08/03/2018 CLINICAL DATA:  Acute LEFT knee pain following fall today. Initial encounter. EXAM: LEFT KNEE - COMPLETE 4+ VIEW COMPARISON:  None. FINDINGS: A mildly comminuted proximal tibial fracture is noted from the  interspinous region inferiorly 10 cm to the MEDIAL cortex. 2 mm LATERAL displacement at the MEDIAL cortex fracture noted. Small lipohemarthrosis identified. No dislocation. IMPRESSION: Mildly comminuted intra-articular proximal tibial fracture extending inferiorly from the articular surface 10 cm. Electronically Signed   By: Margarette Canada M.D.   On: 08/03/2018 13:24   Dg Knee Left Port  Result Date: 08/04/2018 CLINICAL DATA:  Fracture repair EXAM: PORTABLE LEFT KNEE - 1-2 VIEW COMPARISON:  None. FINDINGS: The patient's proximal tibial fracture has been repaired with a sideplate and screws. Hardware is in good position within visualize limits. The distal most aspect of the hardware is not included on today's study. IMPRESSION: Left tibial fracture repair as above. Electronically Signed   By: Dorise Bullion III M.D   On: 08/04/2018 21:37   Dg C-arm 1-60 Min  Result Date: 08/04/2018 CLINICAL DATA:  Tibial plateau fracture EXAM: LEFT TIBIA AND FIBULA - 2 VIEW; DG C-ARM 61-120 MIN COMPARISON:  CT 08/03/2018, radiograph 08/03/2018 FINDINGS: Eight low resolution intraoperative spot views of the left tibia and fibula. Total fluoroscopy time was 52 seconds. Images were obtained during operative placement of medial and lateral surgical plate and multiple fixating screws across proximal tibial fracture. Anatomic alignment on the images submitted. IMPRESSION: Intraoperative fluoroscopic assistance provided during surgical fixation of proximal tibia fracture Electronically Signed   By: Donavan Foil M.D.   On: 08/04/2018 14:14   Dg C-arm 1-60 Min  Result Date: 08/04/2018 CLINICAL DATA:  Tibial plateau fracture EXAM: LEFT TIBIA AND FIBULA - 2 VIEW; DG C-ARM 61-120 MIN COMPARISON:  CT 08/03/2018, radiograph 08/03/2018 FINDINGS: Eight low resolution intraoperative spot views of the left tibia and fibula. Total fluoroscopy time was 52 seconds. Images were obtained during operative placement of medial and lateral surgical  plate and multiple fixating screws across proximal tibial fracture. Anatomic alignment on the images submitted. IMPRESSION: Intraoperative fluoroscopic assistance provided during surgical fixation of proximal tibia fracture  Electronically Signed   By: Donavan Foil M.D.   On: 08/04/2018 14:14     Assessment & Plan:  Plan  I am having Jeannine Boga maintain his aspirin, cetirizine, omeprazole, mometasone, albuterol, Multiple Vitamins-Minerals (MENS ONE DAILY PO), acetaminophen, HYDROcodone-acetaminophen, docusate sodium, enoxaparin, methocarbamol, ezetimibe, metoprolol tartrate, atorvastatin, losartan, and bromocriptine.  No orders of the defined types were placed in this encounter.   Problem List Items Addressed This Visit      Unprioritized   Closed bicondylar fracture of left tibial plateau    Per ortho May need more PT Pt will discuss with ortho      Essential hypertension - Primary    Well controlled, no changes to meds. Encouraged heart healthy diet such as the DASH diet and exercise as tolerated.       Relevant Orders   Lipid panel   Comprehensive metabolic panel   Hyperlipidemia    Encouraged heart healthy diet, increase exercise, avoid trans fats, consider a krill oil cap daily      Relevant Orders   Lipid panel   Comprehensive metabolic panel   Hyperprolactinemia (HCC)    Per neurosurgery and endo      Non-ST elevation myocardial infarction (NSTEMI)    Stable--- no symptoms  F/u cardiology         Follow-up: Return in about 6 months (around 05/28/2019), or if symptoms worsen or fail to improve, for hypertension, hyperlipidemia.  [name], DO

## 2018-11-27 NOTE — Assessment & Plan Note (Signed)
Encouraged heart healthy diet, increase exercise, avoid trans fats, consider a krill oil cap daily 

## 2018-11-27 NOTE — Assessment & Plan Note (Signed)
Well controlled, no changes to meds. Encouraged heart healthy diet such as the DASH diet and exercise as tolerated.  °

## 2018-11-27 NOTE — Assessment & Plan Note (Signed)
Per ortho May need more PT Pt will discuss with ortho

## 2018-11-27 NOTE — Patient Instructions (Signed)
DASH Eating Plan  DASH stands for "Dietary Approaches to Stop Hypertension." The DASH eating plan is a healthy eating plan that has been shown to reduce high blood pressure (hypertension). It may also reduce your risk for type 2 diabetes, heart disease, and stroke. The DASH eating plan may also help with weight loss.  What are tips for following this plan?    General guidelines   Avoid eating more than 2,300 mg (milligrams) of salt (sodium) a day. If you have hypertension, you may need to reduce your sodium intake to 1,500 mg a day.   Limit alcohol intake to no more than 1 drink a day for nonpregnant women and 2 drinks a day for men. One drink equals 12 oz of beer, 5 oz of wine, or 1 oz of hard liquor.   Work with your health care provider to maintain a healthy body weight or to lose weight. Ask what an ideal weight is for you.   Get at least 30 minutes of exercise that causes your heart to beat faster (aerobic exercise) most days of the week. Activities may include walking, swimming, or biking.   Work with your health care provider or diet and nutrition specialist (dietitian) to adjust your eating plan to your individual calorie needs.  Reading food labels     Check food labels for the amount of sodium per serving. Choose foods with less than 5 percent of the Daily Value of sodium. Generally, foods with less than 300 mg of sodium per serving fit into this eating plan.   To find whole grains, look for the word "whole" as the first word in the ingredient list.  Shopping   Buy products labeled as "low-sodium" or "no salt added."   Buy fresh foods. Avoid canned foods and premade or frozen meals.  Cooking   Avoid adding salt when cooking. Use salt-free seasonings or herbs instead of table salt or sea salt. Check with your health care provider or pharmacist before using salt substitutes.   Do not fry foods. Cook foods using healthy methods such as baking, boiling, grilling, and broiling instead.   Cook with  heart-healthy oils, such as olive, canola, soybean, or sunflower oil.  Meal planning   Eat a balanced diet that includes:  ? 5 or more servings of fruits and vegetables each day. At each meal, try to fill half of your plate with fruits and vegetables.  ? Up to 6-8 servings of whole grains each day.  ? Less than 6 oz of lean meat, poultry, or fish each day. A 3-oz serving of meat is about the same size as a deck of cards. One egg equals 1 oz.  ? 2 servings of low-fat dairy each day.  ? A serving of nuts, seeds, or beans 5 times each week.  ? Heart-healthy fats. Healthy fats called Omega-3 fatty acids are found in foods such as flaxseeds and coldwater fish, like sardines, salmon, and mackerel.   Limit how much you eat of the following:  ? Canned or prepackaged foods.  ? Food that is high in trans fat, such as fried foods.  ? Food that is high in saturated fat, such as fatty meat.  ? Sweets, desserts, sugary drinks, and other foods with added sugar.  ? Full-fat dairy products.   Do not salt foods before eating.   Try to eat at least 2 vegetarian meals each week.   Eat more home-cooked food and less restaurant, buffet, and fast food.     When eating at a restaurant, ask that your food be prepared with less salt or no salt, if possible.  What foods are recommended?  The items listed may not be a complete list. Talk with your dietitian about what dietary choices are best for you.  Grains  Whole-grain or whole-wheat bread. Whole-grain or whole-wheat pasta. Brown rice. Oatmeal. Quinoa. Bulgur. Whole-grain and low-sodium cereals. Pita bread. Low-fat, low-sodium crackers. Whole-wheat flour tortillas.  Vegetables  Fresh or frozen vegetables (raw, steamed, roasted, or grilled). Low-sodium or reduced-sodium tomato and vegetable juice. Low-sodium or reduced-sodium tomato sauce and tomato paste. Low-sodium or reduced-sodium canned vegetables.  Fruits  All fresh, dried, or frozen fruit. Canned fruit in natural juice (without  added sugar).  Meat and other protein foods  Skinless chicken or turkey. Ground chicken or turkey. Pork with fat trimmed off. Fish and seafood. Egg whites. Dried beans, peas, or lentils. Unsalted nuts, nut butters, and seeds. Unsalted canned beans. Lean cuts of beef with fat trimmed off. Low-sodium, lean deli meat.  Dairy  Low-fat (1%) or fat-free (skim) milk. Fat-free, low-fat, or reduced-fat cheeses. Nonfat, low-sodium ricotta or cottage cheese. Low-fat or nonfat yogurt. Low-fat, low-sodium cheese.  Fats and oils  Soft margarine without trans fats. Vegetable oil. Low-fat, reduced-fat, or light mayonnaise and salad dressings (reduced-sodium). Canola, safflower, olive, soybean, and sunflower oils. Avocado.  Seasoning and other foods  Herbs. Spices. Seasoning mixes without salt. Unsalted popcorn and pretzels. Fat-free sweets.  What foods are not recommended?  The items listed may not be a complete list. Talk with your dietitian about what dietary choices are best for you.  Grains  Baked goods made with fat, such as croissants, muffins, or some breads. Dry pasta or rice meal packs.  Vegetables  Creamed or fried vegetables. Vegetables in a cheese sauce. Regular canned vegetables (not low-sodium or reduced-sodium). Regular canned tomato sauce and paste (not low-sodium or reduced-sodium). Regular tomato and vegetable juice (not low-sodium or reduced-sodium). Pickles. Olives.  Fruits  Canned fruit in a light or heavy syrup. Fried fruit. Fruit in cream or butter sauce.  Meat and other protein foods  Fatty cuts of meat. Ribs. Fried meat. Bacon. Sausage. Bologna and other processed lunch meats. Salami. Fatback. Hotdogs. Bratwurst. Salted nuts and seeds. Canned beans with added salt. Canned or smoked fish. Whole eggs or egg yolks. Chicken or turkey with skin.  Dairy  Whole or 2% milk, cream, and half-and-half. Whole or full-fat cream cheese. Whole-fat or sweetened yogurt. Full-fat cheese. Nondairy creamers. Whipped toppings.  Processed cheese and cheese spreads.  Fats and oils  Butter. Stick margarine. Lard. Shortening. Ghee. Bacon fat. Tropical oils, such as coconut, palm kernel, or palm oil.  Seasoning and other foods  Salted popcorn and pretzels. Onion salt, garlic salt, seasoned salt, table salt, and sea salt. Worcestershire sauce. Tartar sauce. Barbecue sauce. Teriyaki sauce. Soy sauce, including reduced-sodium. Steak sauce. Canned and packaged gravies. Fish sauce. Oyster sauce. Cocktail sauce. Horseradish that you find on the shelf. Ketchup. Mustard. Meat flavorings and tenderizers. Bouillon cubes. Hot sauce and Tabasco sauce. Premade or packaged marinades. Premade or packaged taco seasonings. Relishes. Regular salad dressings.  Where to find more information:   National Heart, Lung, and Blood Institute: www.nhlbi.nih.gov   American Heart Association: www.heart.org  Summary   The DASH eating plan is a healthy eating plan that has been shown to reduce high blood pressure (hypertension). It may also reduce your risk for type 2 diabetes, heart disease, and stroke.   With the   DASH eating plan, you should limit salt (sodium) intake to 2,300 mg a day. If you have hypertension, you may need to reduce your sodium intake to 1,500 mg a day.   When on the DASH eating plan, aim to eat more fresh fruits and vegetables, whole grains, lean proteins, low-fat dairy, and heart-healthy fats.   Work with your health care provider or diet and nutrition specialist (dietitian) to adjust your eating plan to your individual calorie needs.  This information is not intended to replace advice given to you by your health care provider. Make sure you discuss any questions you have with your health care provider.  Document Released: 10/07/2011 Document Revised: 10/11/2016 Document Reviewed: 10/11/2016  Elsevier Interactive Patient Education  2019 [employer]

## 2018-11-27 NOTE — Assessment & Plan Note (Signed)
Per neurosurgery and endo

## 2018-11-27 NOTE — Assessment & Plan Note (Signed)
Stable--- no symptoms  F/u cardiology

## 2018-12-04 ENCOUNTER — Encounter: Payer: Self-pay | Admitting: *Deleted

## 2018-12-06 ENCOUNTER — Encounter: Payer: Self-pay | Admitting: *Deleted

## 2018-12-27 ENCOUNTER — Ambulatory Visit: Payer: Self-pay | Admitting: Internal Medicine

## 2018-12-28 ENCOUNTER — Ambulatory Visit (INDEPENDENT_AMBULATORY_CARE_PROVIDER_SITE_OTHER): Payer: Medicare Other | Admitting: Family Medicine

## 2018-12-28 ENCOUNTER — Encounter: Payer: Self-pay | Admitting: Family Medicine

## 2018-12-28 VITALS — BP 116/78 | HR 70 | Temp 97.6°F | Resp 14 | Ht 74.0 in | Wt 287.0 lb

## 2018-12-28 DIAGNOSIS — B029 Zoster without complications: Secondary | ICD-10-CM

## 2018-12-28 MED ORDER — TRAMADOL HCL 50 MG PO TABS: 50.0000 mg | ORAL_TABLET | Freq: Four times a day (QID) | ORAL | 0 refills | Status: DC | PRN

## 2018-12-28 MED ORDER — VALACYCLOVIR HCL 1 G PO TABS: 1000.0000 mg | ORAL_TABLET | Freq: Three times a day (TID) | ORAL | 0 refills | Status: DC

## 2018-12-28 MED ORDER — PREDNISONE 10 MG PO TABS: ORAL_TABLET | ORAL | 0 refills | Status: DC

## 2018-12-28 MED FILL — traMADol HCL 50 MG TABS: 50 | 8 days supply | Qty: 30 | Fill #0

## 2018-12-28 MED FILL — predniSONE 10 MG TABS: 10 | 12 days supply | Qty: 20 | Fill #0

## 2018-12-28 MED FILL — valACYclovir HCL 1 GM TABS: 1 | 10 days supply | Qty: 30 | Fill #0

## 2018-12-28 NOTE — Patient Instructions (Signed)
Shingles    Shingles, which is also known as herpes zoster, is an infection that causes a painful skin rash and fluid-filled blisters. It is caused by a virus.  Shingles only develops in people who:   Have had chickenpox.   Have been given a medicine to protect against chickenpox (have been vaccinated). Shingles is rare in this group.  What are the causes?  Shingles is caused by varicella-zoster virus (VZV). This is the same virus that causes chickenpox. After a person is exposed to VZV, the virus stays in the body in an inactive (dormant) state. Shingles develops if the virus is reactivated. This can happen many years after the first (initial) exposure to VZV. It is not known what causes this virus to be reactivated.  What increases the risk?  People who have had chickenpox or received the chickenpox vaccine are at risk for shingles. Shingles infection is more common in people who:   Are older than age 60.   Have a weakened disease-fighting system (immune system), such as people with:  ? HIV.  ? AIDS.  ? Cancer.   Are taking medicines that weaken the immune system, such as transplant medicines.   Are experiencing a lot of stress.  What are the signs or symptoms?  Early symptoms of this condition include itching, tingling, and pain in an area on your skin. Pain may be described as burning, stabbing, or throbbing.  A few days or weeks after early symptoms start, a painful red rash appears. The rash is usually on one side of the body and has a band-like or belt-like pattern. The rash eventually turns into fluid-filled blisters that break open, change into scabs, and dry up in about 2-3 weeks.  At any time during the infection, you may also develop:   A fever.   Chills.   A headache.   An upset stomach.  How is this diagnosed?  This condition is diagnosed with a skin exam. Skin or fluid samples may be taken from the blisters before a diagnosis is made. These samples are examined under a microscope or sent to  a lab for testing.  How is this treated?  The rash may last for several weeks. There is not a specific cure for this condition. Your health care provider will probably prescribe medicines to help you manage pain, recover more quickly, and avoid long-term problems. Medicines may include:   Antiviral drugs.   Anti-inflammatory drugs.   Pain medicines.   Anti-itching medicines (antihistamines).  If the area involved is on your face, you may be referred to a specialist, such as an eye doctor (ophthalmologist) or an ear, nose, and throat (ENT) doctor (otolaryngologist) to help you avoid eye problems, chronic pain, or disability.  Follow these instructions at home:  Medicines   Take over-the-counter and prescription medicines only as told by your health care provider.   Apply an anti-itch cream or numbing cream to the affected area as told by your health care provider.  Relieving itching and discomfort     Apply cold, wet cloths (cold compresses) to the area of the rash or blisters as told by your health care provider.   Cool baths can be soothing. Try adding baking soda or dry oatmeal to the water to reduce itching. Do not bathe in hot water.  Blister and rash care   Keep your rash covered with a loose bandage (dressing). Wear loose-fitting clothing to help ease the pain of material rubbing against the rash.     Keep your rash and blisters clean by washing the area with mild soap and cool water as told by your health care provider.   Check your rash every day for signs of infection. Check for:  ? More redness, swelling, or pain.  ? Fluid or blood.  ? Warmth.  ? Pus or a bad smell.   Do not scratch your rash or pick at your blisters. To help avoid scratching:  ? Keep your fingernails clean and cut short.  ? Wear gloves or mittens while you sleep, if scratching is a problem.  General instructions   Rest as told by your health care provider.   Keep all follow-up visits as told by your health care provider. This  is important.   Wash your hands often with soap and water. If soap and water are not available, use hand sanitizer. Doing this lowers your chance of getting a bacterial skin infection.   Before your blisters change into scabs, your shingles infection can cause chickenpox in people who have never had it or have never been vaccinated against it. To prevent this from happening, avoid contact with other people, especially:  ? Babies.  ? Pregnant women.  ? Children who have eczema.  ? Elderly people who have transplants.  ? People who have chronic illnesses, such as cancer or AIDS.  Contact a health care provider if:   Your pain is not relieved with prescribed medicines.   Your pain does not get better after the rash heals.   You have signs of infection in the rash area, such as:  ? More redness, swelling, or pain around the rash.  ? Fluid or blood coming from the rash.  ? The rash area feeling warm to the touch.  ? Pus or a bad smell coming from the rash.  Get help right away if:   The rash is on your face or nose.   You have facial pain, pain around your eye area, or loss of feeling on one side of your face.   You have difficulty seeing.   You have ear pain or have ringing in your ear.   You have a loss of taste.   Your condition gets worse.  Summary   Shingles, which is also known as herpes zoster, is an infection that causes a painful skin rash and fluid-filled blisters.   This condition is diagnosed with a skin exam. Skin or fluid samples may be taken from the blisters and examined before the diagnosis is made.   Keep your rash covered with a loose bandage (dressing). Wear loose-fitting clothing to help ease the pain of material rubbing against the rash.   Before your blisters change into scabs, your shingles infection can cause chickenpox in people who have never had it or have never been vaccinated against it.  This information is not intended to replace advice given to you by your health care  provider. Make sure you discuss any questions you have with your health care provider.  Document Released: 10/18/2005 Document Revised: 06/22/2017 Document Reviewed: 06/22/2017  Elsevier Interactive Patient Education  2019 [employer]

## 2018-12-28 NOTE — Progress Notes (Signed)
Patient ID: Michael Pearson, male    DOB: [birth year]  Age: 58 y.o. MRN: [id number]    Subjective:  Subjective  HPI DARRIEL UTTER presents for rash along L arm x 48 hours.   He had pain in his r arm from shoulder to hand for the last month and rash broke out 2 days ago.  It is painful   Review of Systems  Constitutional: Negative for appetite change, diaphoresis, fatigue and unexpected weight change.  Eyes: Negative for pain, redness and visual disturbance.  Respiratory: Negative for cough, chest tightness, shortness of breath and wheezing.   Cardiovascular: Negative for chest pain, palpitations and leg swelling.  Endocrine: Negative for cold intolerance, heat intolerance, polydipsia, polyphagia and polyuria.  Genitourinary: Negative for difficulty urinating, dysuria and frequency.  Skin: Positive for rash.  Neurological: Negative for dizziness, light-headedness, numbness and headaches.    History Past Medical History:  Diagnosis Date  . Allergy   . Arthritis   . Asthma   . CAD (coronary artery disease)    NSTEMI 7/12:  Cardiac cath on 7/17: pLAD occluded, Dx 30-40%, pRCA 30-40%, mRCA 30-40%.  Proximal LAD was treated with a BMS.  Echo 7/19:  EF 60-65%, mild LVH.    ETT-Myoview 6/14:  Inf thinning, no ischemia, EF 64%, normal study  . Cognitive communication deficit   . Colitis   . Diverticulosis   . Duodenitis    May 2012 with heme positive stools at this time. Endorses only rare streaking of blood now.  . External hemorrhoids   . GERD (gastroesophageal reflux disease)   . Heart murmur    as a child per the pt  . Hiatal hernia   . Hx of hemorrhoids   . Hyperlipidemia   . Hypertension   . Kidney stones   . Microadenoma ([place])    MRI in 2008 demonstrating 4.6 mm area of pituitary gland   . Migraines    Has been evaluated multiple times in past for chronic headache in which pt has had occasional nose bleeds and bloodshot eyes. This lasted for 4-5 months. CT of the head was negative  in May 2012.  . Myocardial infarction ([place]) 05/17/2011  . Pituitary tumor   . RBBB (right bundle branch block)    Noted on EKG in 2008  . Ulcer     He has a past surgical history that includes Hernia repair; Tooth extraction; Hammer toe surgery; egd (03/05/2011); Flexible sigmoidoscopy (03/05/2011); Heart stent; Upper gastrointestinal endoscopy; Colonoscopy; Sigmoidoscopy; and ORIF tibia plateau (Left, 08/04/2018).   His family history includes Cancer in his father; Colon cancer in his maternal aunt; Colon polyps in his maternal aunt and paternal grandmother; Coronary artery disease in his brother; Heart attack (age of onset: 63) in his father; Hypertension in his brother; Obesity in his brother; Skin cancer in his father; Stroke in his mother.He reports that he has never smoked. He has never used smokeless tobacco. He reports that he does not drink alcohol or use drugs.  Current Outpatient Medications on File Prior to Visit  Medication Sig Dispense Refill  . acetaminophen (TYLENOL) 500 MG tablet Take 1 tablet (500 mg total) by mouth every 12 (twelve) hours. 60 tablet 0  . albuterol (PROVENTIL HFA;VENTOLIN HFA) 108 (90 Base) MCG/ACT inhaler Inhale 2 puffs into the lungs every 4 (four) hours as needed for wheezing or shortness of breath. 1 Inhaler 2  . aspirin 81 MG tablet Take 81 mg by mouth daily.      Marland Kitchen  atorvastatin (LIPITOR) 40 MG tablet TAKE ONE TABLET BY MOUTH ONE TIME DAILY  90 tablet 1  . bromocriptine (PARLODEL) 2.5 MG tablet Take 1 tablet (2.5 mg total) by mouth 2 (two) times daily. 180 tablet 2  . cetirizine (ZYRTEC) 10 MG tablet Take 10 mg by mouth daily as needed for allergies.     Marland Kitchen docusate sodium (COLACE) 100 MG capsule Take 1 capsule (100 mg total) by mouth 2 (two) times daily. 20 capsule 0  . enoxaparin (LOVENOX) 40 MG/0.4ML injection Inject 0.4 mLs (40 mg total) into the skin daily. 30 Syringe 0  . ezetimibe (ZETIA) 10 MG tablet TAKE ONE TABLET BY MOUTH ONE TIME DAILY  90 tablet 1    . HYDROcodone-acetaminophen (NORCO) 7.5-325 MG tablet Take 1-2 tablets by mouth every 6 (six) hours as needed for moderate pain or severe pain. 50 tablet 0  . losartan (COZAAR) 50 MG tablet TAKE ONE TABLET BY MOUTH ONE TIME DAILY  90 tablet 1  . methocarbamol (ROBAXIN) 500 MG tablet Take 1 tablet (500 mg total) by mouth every 8 (eight) hours as needed for muscle spasms. 60 tablet 0  . metoprolol tartrate (LOPRESSOR) 25 MG tablet TAKE HALF TABLET BY MOUTH TWICE DAILY  90 tablet 1  . mometasone (NASONEX) 50 MCG/ACT nasal spray Place 2 sprays into the nose daily. (Patient taking differently: Place 2 sprays into the nose daily as needed (congestion). ) 17 g 2  . Multiple Vitamins-Minerals (MENS ONE DAILY PO) Take 1 tablet by mouth daily.    Marland Kitchen omeprazole (PRILOSEC) 40 MG capsule Take 1 capsule (40 mg total) by mouth daily. 30 minutes before breakfast (Patient taking differently: Take 40 mg by mouth at bedtime. 30 minutes before breakfast) 30 capsule 3   No current facility-administered medications on file prior to visit.      Objective:  Objective  Physical Exam Vitals signs and nursing note reviewed.  Constitutional:      General: He is sleeping.     Appearance: He is well-developed.  HENT:     Head: Normocephalic and atraumatic.  Eyes:     Pupils: Pupils are equal, round, and reactive to light.  Neck:     Musculoskeletal: Normal range of motion and neck supple.     Thyroid: No thyromegaly.  Cardiovascular:     Rate and Rhythm: Normal rate and regular rhythm.     Heart sounds: No murmur.  Pulmonary:     Effort: Pulmonary effort is normal. No respiratory distress.     Breath sounds: Normal breath sounds. No wheezing or rales.  Chest:     Chest wall: No tenderness.  Musculoskeletal:        General: No tenderness.  Skin:    General: Skin is warm and dry.     Findings: Rash present. Rash is vesicular.       Neurological:     Mental Status: He is oriented to person, place, and time.   Psychiatric:        Behavior: Behavior normal.        Thought Content: Thought content normal.        Judgment: Judgment normal.    BP 116/78   Pulse 70   Temp 97.6 F (36.4 C)   Resp 14   Ht 6\' 2"  (1.88 m)   Wt 287 lb (130.2 kg)   SpO2 95%   BMI 36.85 kg/m  Wt Readings from Last 3 Encounters:  12/28/18 287 lb (130.2 kg)  11/27/18 293  lb (132.9 kg)  11/08/18 294 lb 3.2 oz (133.4 kg)     Lab Results  Component Value Date   WBC 6.6 08/06/2018   HGB 12.0 (L) 08/06/2018   HCT 37.2 (L) 08/06/2018   PLT 174 08/06/2018   GLUCOSE 108 (H) 11/27/2018   CHOL 108 11/27/2018   TRIG 70.0 11/27/2018   HDL 52.10 11/27/2018   LDLCALC 42 11/27/2018   ALT 60 (H) 11/27/2018   AST 48 (H) 11/27/2018   NA 140 11/27/2018   K 4.4 11/27/2018   CL 105 11/27/2018   CREATININE 0.79 11/27/2018   BUN 10 11/27/2018   CO2 25 11/27/2018   TSH 0.564 08/05/2018   PSA 0.33 11/23/2016   INR 0.97 05/17/2011   HGBA1C 5.8 09/17/2014   MICROALBUR 1.5 11/20/2015    Dg Tibia/fibula Left  Result Date: 08/04/2018 CLINICAL DATA:  Tibial plateau fracture EXAM: LEFT TIBIA AND FIBULA - 2 VIEW; DG C-ARM 61-120 MIN COMPARISON:  CT 08/03/2018, radiograph 08/03/2018 FINDINGS: Eight low resolution intraoperative spot views of the left tibia and fibula. Total fluoroscopy time was 52 seconds. Images were obtained during operative placement of medial and lateral surgical plate and multiple fixating screws across proximal tibial fracture. Anatomic alignment on the images submitted. IMPRESSION: Intraoperative fluoroscopic assistance provided during surgical fixation of proximal tibia fracture Electronically Signed   By: Donavan Foil M.D.   On: 08/04/2018 14:14   Mr Jeri Cos ZO Contrast  Result Date: 08/04/2018 CLINICAL DATA:  Follow-up pituitary tumor. EXAM: MRI HEAD WITHOUT AND WITH CONTRAST TECHNIQUE: Multiplanar, multiecho pulse sequences of the brain and surrounding structures were obtained without and with  intravenous contrast. CONTRAST:  5 mL Gadavist COMPARISON:  06/19/2016 FINDINGS: Brain: There is no evidence of acute infarct, intracranial hemorrhage, midline shift, or extra-axial fluid collection. The ventricles and sulci are within normal limits for age. No significant white matter disease is evident. Dedicated pituitary imaging was performed and is mildly to moderately motion degraded. A hypoenhancing mass is again seen in the right aspect of the pituitary gland, however precise measurement is limited by motion. The lesion appears to have mildly decreased in size with the superior margin of the gland now being essentially flat where as it was convex before. The mass is estimated to measure 8 x 6 mm (transverse x craniocaudal) on coronal dynamic post-contrast imaging (series 1102, image 3, previously 8 x 9 mm when measured similarly). No definite cavernous sinus invasion is currently evident. The infundibulum is mildly deviated leftward. The optic chiasm is unremarkable. Vascular: Major intracranial vascular flow voids are preserved. Skull and upper cervical spine: Unremarkable bone marrow signal. Sinuses/Orbits: Unremarkable orbits. Paranasal sinuses and mastoid air cells are clear. Other: None. IMPRESSION: 1. Motion degraded examination with slight decrease in size of pituitary adenoma, now 8 mm. 2. Otherwise unremarkable appearance of the brain. Electronically Signed   By: Logan Bores M.D.   On: 08/04/2018 19:29   Ct Tibia Fibula Left Wo Contrast  Result Date: 08/03/2018 CLINICAL DATA:  Left lower leg injury secondary to a trip and fall today while walking. Initial encounter. EXAM: CT OF THE LOWER LEFT EXTREMITY WITHOUT CONTRAST TECHNIQUE: Multidetector CT imaging of the lower left extremity was performed according to the standard protocol. COMPARISON:  Plain films left lower leg this same scratch the plain films left knee this same day. FINDINGS: Bones/Joint/Cartilage As seen on the comparison plain  films, the patient has a nondisplaced fracture of the proximal tibia. The main component of the fracture  is obliquely transverse in orientation originating in the lateral metaphysis 5 cm below the joint line and extending inferiorly through the medial metaphysis approximately 10 cm below the joint line. A longitudinal fracture line extends cephalad through the medial tibial eminence. The tibial plateaus are preserved. No other fracture is identified. Ligaments Suboptimally assessed by CT. The cruciate and collateral ligaments appear intact. Muscles and Tendons Intact and normal in appearance. Soft tissues Small lipohemarthrosis is noted. IMPRESSION: Nondisplaced fracture of the proximal metaphysis and diaphysis of the tibia includes a longitudinal component extending cephalad through the medial tibial eminence. The tibial plateaus are intact. No other fracture is identified. Electronically Signed   By: Inge Rise M.D.   On: 08/03/2018 16:05   Dg Chest Port 1 View  Result Date: 08/04/2018 CLINICAL DATA:  Tachypnea. EXAM: PORTABLE CHEST 1 VIEW COMPARISON:  01/21/2018.  05/17/2011. FINDINGS: Mild mediastinal and right hilar fullness, this is most likely vascular. Upright PA lateral chest x-ray suggested for further evaluation. Mild bibasilar atelectasis. No pleural effusion or pneumothorax. Heart size normal. IMPRESSION: 1. Mild mediastinal and right hilar fullness. This is most likely vascular related to portable technique. PA lateral chest x-ray suggested for further evaluation. 2.  Mild bibasilar subsegmental atelectasis. Electronically Signed   By: Marcello Moores  Register   On: 08/04/2018 14:09   Dg Knee Complete 4 Views Left  Result Date: 08/03/2018 CLINICAL DATA:  Acute LEFT knee pain following fall today. Initial encounter. EXAM: LEFT KNEE - COMPLETE 4+ VIEW COMPARISON:  None. FINDINGS: A mildly comminuted proximal tibial fracture is noted from the interspinous region inferiorly 10 cm to the MEDIAL  cortex. 2 mm LATERAL displacement at the MEDIAL cortex fracture noted. Small lipohemarthrosis identified. No dislocation. IMPRESSION: Mildly comminuted intra-articular proximal tibial fracture extending inferiorly from the articular surface 10 cm. Electronically Signed   By: Margarette Canada M.D.   On: 08/03/2018 13:24   Dg Knee Left Port  Result Date: 08/04/2018 CLINICAL DATA:  Fracture repair EXAM: PORTABLE LEFT KNEE - 1-2 VIEW COMPARISON:  None. FINDINGS: The patient's proximal tibial fracture has been repaired with a sideplate and screws. Hardware is in good position within visualize limits. The distal most aspect of the hardware is not included on today's study. IMPRESSION: Left tibial fracture repair as above. Electronically Signed   By: Dorise Bullion III M.D   On: 08/04/2018 21:37   Dg C-arm 1-60 Min  Result Date: 08/04/2018 CLINICAL DATA:  Tibial plateau fracture EXAM: LEFT TIBIA AND FIBULA - 2 VIEW; DG C-ARM 61-120 MIN COMPARISON:  CT 08/03/2018, radiograph 08/03/2018 FINDINGS: Eight low resolution intraoperative spot views of the left tibia and fibula. Total fluoroscopy time was 52 seconds. Images were obtained during operative placement of medial and lateral surgical plate and multiple fixating screws across proximal tibial fracture. Anatomic alignment on the images submitted. IMPRESSION: Intraoperative fluoroscopic assistance provided during surgical fixation of proximal tibia fracture Electronically Signed   By: Donavan Foil M.D.   On: 08/04/2018 14:14   Dg C-arm 1-60 Min  Result Date: 08/04/2018 CLINICAL DATA:  Tibial plateau fracture EXAM: LEFT TIBIA AND FIBULA - 2 VIEW; DG C-ARM 61-120 MIN COMPARISON:  CT 08/03/2018, radiograph 08/03/2018 FINDINGS: Eight low resolution intraoperative spot views of the left tibia and fibula. Total fluoroscopy time was 52 seconds. Images were obtained during operative placement of medial and lateral surgical plate and multiple fixating screws across proximal  tibial fracture. Anatomic alignment on the images submitted. IMPRESSION: Intraoperative fluoroscopic assistance provided during surgical fixation of  proximal tibia fracture Electronically Signed   By: Donavan Foil M.D.   On: 08/04/2018 14:14     Assessment & Plan:  Plan  I am having Jeannine Boga start on valACYclovir, predniSONE, and traMADol. I am also having him maintain his aspirin, cetirizine, omeprazole, mometasone, albuterol, Multiple Vitamins-Minerals (MENS ONE DAILY PO), acetaminophen, HYDROcodone-acetaminophen, docusate sodium, enoxaparin, methocarbamol, ezetimibe, metoprolol tartrate, atorvastatin, losartan, and bromocriptine.  Meds ordered this encounter  Medications  . valACYclovir (VALTREX) 1000 MG tablet    Sig: Take 1 tablet (1,000 mg total) by mouth 3 (three) times daily.    Dispense:  30 tablet    Refill:  0  . predniSONE (DELTASONE) 10 MG tablet    Sig: TAKE 3 TABLETS PO QD FOR 3 DAYS THEN TAKE 2 TABLETS PO QD FOR 3 DAYS THEN TAKE 1 TABLET PO QD FOR 3 DAYS THEN TAKE 1/2 TAB PO QD FOR 3 DAYS    Dispense:  20 tablet    Refill:  0  . traMADol (ULTRAM) 50 MG tablet    Sig: Take 1 tablet (50 mg total) by mouth every 6 (six) hours as needed for severe pain.    Dispense:  30 tablet    Refill:  0    Problem List Items Addressed This Visit    None    Visit Diagnoses    Herpes zoster without complication    -  Primary   Relevant Medications   valACYclovir (VALTREX) 1000 MG tablet   predniSONE (DELTASONE) 10 MG tablet   traMADol (ULTRAM) 50 MG tablet      Follow-up: No follow-ups on file.  [name], DO

## 2019-03-30 ENCOUNTER — Telehealth: Payer: Self-pay

## 2019-03-30 NOTE — Telephone Encounter (Signed)
LOV 11/08/18. Per Dr. Loanne Drilling, f/u appt needed in 2 mo. Unable to LVM d/t no answer

## 2019-04-26 ENCOUNTER — Other Ambulatory Visit: Payer: Self-pay | Admitting: Family Medicine

## 2019-04-26 DIAGNOSIS — E785 Hyperlipidemia, unspecified: Secondary | ICD-10-CM

## 2019-04-26 DIAGNOSIS — I2510 Atherosclerotic heart disease of native coronary artery without angina pectoris: Secondary | ICD-10-CM

## 2019-04-26 DIAGNOSIS — I10 Essential (primary) hypertension: Secondary | ICD-10-CM

## 2019-04-30 ENCOUNTER — Other Ambulatory Visit: Payer: Self-pay

## 2019-05-02 ENCOUNTER — Encounter: Payer: Self-pay | Admitting: Endocrinology

## 2019-05-02 ENCOUNTER — Ambulatory Visit: Payer: Medicare Other | Admitting: Endocrinology

## 2019-05-02 ENCOUNTER — Ambulatory Visit (INDEPENDENT_AMBULATORY_CARE_PROVIDER_SITE_OTHER): Payer: Medicare Other | Admitting: Endocrinology

## 2019-05-02 ENCOUNTER — Other Ambulatory Visit: Payer: Self-pay

## 2019-05-02 VITALS — BP 110/70 | HR 66 | Ht 74.0 in | Wt 264.4 lb

## 2019-05-02 DIAGNOSIS — D352 Benign neoplasm of pituitary gland: Secondary | ICD-10-CM

## 2019-05-02 DIAGNOSIS — I2510 Atherosclerotic heart disease of native coronary artery without angina pectoris: Secondary | ICD-10-CM | POA: Diagnosis not present

## 2019-05-02 DIAGNOSIS — E221 Hyperprolactinemia: Secondary | ICD-10-CM | POA: Diagnosis not present

## 2019-05-02 LAB — BASIC METABOLIC PANEL
BUN: 12 mg/dL (ref 6–23)
CO2: 30 mEq/L (ref 19–32)
Calcium: 9.6 mg/dL (ref 8.4–10.5)
Chloride: 104 mEq/L (ref 96–112)
Creatinine, Ser: 0.91 mg/dL (ref 0.40–1.50)
GFR: 85.47 mL/min (ref >60.00)
Glucose, Bld: 105 mg/dL — ABNORMAL HIGH (ref 70–99)
Potassium: 4.7 mEq/L (ref 3.5–5.1)
Sodium: 139 mEq/L (ref 135–145)

## 2019-05-02 NOTE — Patient Instructions (Addendum)
blood tests are requested for you today.  We'll let you know about the results.  If it is high, we'll change the bromocriptine to an alternative called "cabergoline."   When the blood test is normal, we'll recheck the MRI.   Please come back for a follow-up appointment in 2 months.

## 2019-05-02 NOTE — Progress Notes (Signed)
Subjective:    Patient ID: Michael Pearson, male    DOB: 13-Nov-1960, 58 y.o.   MRN: [id number]  HPI Pt returns for f/u of pituitary microprolactinoma (dx'ed 2005, adenoma was incidentally noted to have a pituitary adenoma; in 2017, Dr Etter Sjogren found moderate elevated prolactin; a friend Marden Noble Early, helps, and is helping pt pursue disability); f/u MRI in 2019 showed slight interval growth; h/o near-syncope limits bromocriptine dosage; other pituitary functions are normal).  He takes parlodel just QD.  Pt reports slight headache throughout the head, but no assoc nausea.    Past Medical History:  Diagnosis Date   Allergy    Arthritis    Asthma    CAD (coronary artery disease)    NSTEMI 7/12:  Cardiac cath on 7/17: pLAD occluded, Dx 30-40%, pRCA 30-40%, mRCA 30-40%.  Proximal LAD was treated with a BMS.  Echo 7/19:  EF 60-65%, mild LVH.    ETT-Myoview 6/14:  Inf thinning, no ischemia, EF 64%, normal study   Cognitive communication deficit    Colitis    Diverticulosis    Duodenitis    May 2012 with heme positive stools at this time. Endorses only rare streaking of blood now.   External hemorrhoids    GERD (gastroesophageal reflux disease)    Heart murmur    as a child per the pt   Hiatal hernia    Hx of hemorrhoids    Hyperlipidemia    Hypertension    Kidney stones    Microadenoma ([place])    MRI in 2008 demonstrating 4.6 mm area of pituitary gland    Migraines    Has been evaluated multiple times in past for chronic headache in which pt has had occasional nose bleeds and bloodshot eyes. This lasted for 4-5 months. CT of the head was negative in May 2012.   Myocardial infarction ([place]) 05/17/2011   Pituitary tumor    RBBB (right bundle branch block)    Noted on EKG in 2008   Ulcer     Past Surgical History:  Procedure Laterality Date   COLONOSCOPY     egd  03/05/2011   Dr. Benson [place]   FLEXIBLE SIGMOIDOSCOPY  03/05/2011   Dr. Benson [place]   HAMMER TOE SURGERY     Heart stent      [place]     In 20's   ORIF TIBIA PLATEAU Left 08/04/2018   Procedure: OPEN REDUCTION INTERNAL FIXATION (ORIF) TIBIAL PLATEAU;  Surgeon: Altamese Pine, MD;  Location: [facility];  Service: Orthopedics;  Laterality: Left;   SIGMOIDOSCOPY     TOOTH EXTRACTION     UPPER GASTROINTESTINAL ENDOSCOPY      Social History   Socioeconomic History   Marital status: Single    Spouse name: Not on file   Number of children: Not on file   Years of education: Not on file   Highest education level: Not on file  Occupational History   Occupation: Personnel officer-- cleans 3 buildings    Comment: Fairly physical job    Employer: [employer]  Social Designer, fashion/clothing strain: Not on file   Food insecurity    Worry: Not on file    Inability: Not on file   Transportation needs    Medical: Not on file    Non-medical: Not on file  Tobacco Use   Smoking status: Never Smoker   Smokeless tobacco: Never Used  Substance and Sexual Activity   Alcohol use: No   Drug use:  No   Sexual activity: Not Currently  Lifestyle   Physical activity    Days per week: Not on file    Minutes per session: Not on file   Stress: Not on file  Relationships   Social connections    Talks on phone: Not on file    Gets together: Not on file    Attends religious service: Not on file    Active member of club or organization: Not on file    Attends meetings of clubs or organizations: Not on file    Relationship status: Not on file   Intimate partner violence    Fear of current or ex partner: Not on file    Emotionally abused: Not on file    Physically abused: Not on file    Forced sexual activity: Not on file  Other Topics Concern   Not on file  Social History Narrative   Lives with brother and mother   Mother is quite sick, he and his brother take turns taking care of her   No children    Current Outpatient Medications on File Prior to Visit  Medication Sig Dispense  Refill   acetaminophen (TYLENOL) 500 MG tablet Take 1 tablet (500 mg total) by mouth every 12 (twelve) hours. 60 tablet 0   albuterol (PROVENTIL HFA;VENTOLIN HFA) 108 (90 Base) MCG/ACT inhaler Inhale 2 puffs into the lungs every 4 (four) hours as needed for wheezing or shortness of breath. 1 Inhaler 2   aspirin 81 MG tablet Take 81 mg by mouth daily.       atorvastatin (LIPITOR) 40 MG tablet TAKE ONE TABLET BY MOUTH ONE TIME DAILY  90 tablet 0   bromocriptine (PARLODEL) 2.5 MG tablet Take 1 tablet (2.5 mg total) by mouth 2 (two) times daily. 180 tablet 2   cetirizine (ZYRTEC) 10 MG tablet Take 10 mg by mouth daily as needed for allergies.      docusate sodium (COLACE) 100 MG capsule Take 1 capsule (100 mg total) by mouth 2 (two) times daily. 20 capsule 0   enoxaparin (LOVENOX) 40 MG/0.4ML injection Inject 0.4 mLs (40 mg total) into the skin daily. 30 Syringe 0   ezetimibe (ZETIA) 10 MG tablet TAKE ONE TABLET BY MOUTH ONE TIME DAILY  90 tablet 0   HYDROcodone-acetaminophen (NORCO) 7.5-325 MG tablet Take 1-2 tablets by mouth every 6 (six) hours as needed for moderate pain or severe pain. 50 tablet 0   losartan (COZAAR) 50 MG tablet TAKE ONE TABLET BY MOUTH ONE TIME DAILY  90 tablet 0   methocarbamol (ROBAXIN) 500 MG tablet Take 1 tablet (500 mg total) by mouth every 8 (eight) hours as needed for muscle spasms. 60 tablet 0   metoprolol tartrate (LOPRESSOR) 25 MG tablet TAKE HALF TABLET BY MOUTH TWICE DAILY  90 tablet 0   mometasone (NASONEX) 50 MCG/ACT nasal spray Place 2 sprays into the nose daily. (Patient taking differently: Place 2 sprays into the nose daily as needed (congestion). ) 17 g 2   Multiple Vitamins-Minerals (MENS ONE DAILY PO) Take 1 tablet by mouth daily.     omeprazole (PRILOSEC) 40 MG capsule Take 1 capsule (40 mg total) by mouth daily. 30 minutes before breakfast (Patient taking differently: Take 40 mg by mouth at bedtime. 30 minutes before breakfast) 30 capsule 3    predniSONE (DELTASONE) 10 MG tablet TAKE 3 TABLETS PO QD FOR 3 DAYS THEN TAKE 2 TABLETS PO QD FOR 3 DAYS THEN TAKE 1 TABLET PO  QD FOR 3 DAYS THEN TAKE 1/2 TAB PO QD FOR 3 DAYS 20 tablet 0   traMADol (ULTRAM) 50 MG tablet Take 1 tablet (50 mg total) by mouth every 6 (six) hours as needed for severe pain. 30 tablet 0   valACYclovir (VALTREX) 1000 MG tablet Take 1 tablet (1,000 mg total) by mouth 3 (three) times daily. 30 tablet 0   No current facility-administered medications on file prior to visit.     No Known Allergies  Family History  Problem Relation Age of Onset   Stroke Mother    Heart attack Father 73       MI   Skin cancer Father    Cancer Father        ? type "rare"   Obesity Brother    Coronary artery disease Brother        Possible, pt not sure of specifics   Hypertension Brother    Colon polyps Maternal Aunt    Colon cancer Maternal Aunt    Colon polyps Paternal Grandmother    Other Neg Hx    Esophageal cancer Neg Hx    Rectal cancer Neg Hx    Stomach cancer Neg Hx     BP 110/70 (BP Location: Left Arm, Patient Position: Sitting, Cuff Size: Large)    Pulse 66    Ht 6\' 2"  (1.88 m)    Wt 264 lb 6.4 oz (119.9 kg)    SpO2 96%    BMI 33.95 kg/m    Review of Systems denies diplopia.  No weight change    Objective:   Physical Exam VITAL SIGNS:  See vs page GENERAL: no distress EYES: EOMI.    Lab Results  Component Value Date   TSH 0.564 08/05/2018       Assessment & Plan:  Hyperprolactinemia: well-controlled.   Pituitary adenoma: due for recheck.  Headache: uncertain if this is related to the above.    Patient Instructions  blood tests are requested for you today.  We'll let you know about the results.  If it is high, we'll change the bromocriptine to an alternative called "cabergoline."   When the blood test is normal, we'll recheck the MRI.   Please come back for a follow-up appointment in 2 months.

## 2019-05-03 LAB — PROLACTIN: Prolactin: 5.7 ng/mL (ref 2.0–18.0)

## 2019-05-29 ENCOUNTER — Encounter: Payer: Self-pay | Admitting: Family Medicine

## 2019-05-29 ENCOUNTER — Ambulatory Visit (INDEPENDENT_AMBULATORY_CARE_PROVIDER_SITE_OTHER): Payer: Medicare Other | Admitting: Family Medicine

## 2019-05-29 ENCOUNTER — Other Ambulatory Visit: Payer: Self-pay

## 2019-05-29 VITALS — BP 114/68 | HR 56 | Temp 97.9°F | Resp 18 | Ht 74.0 in | Wt 263.6 lb

## 2019-05-29 DIAGNOSIS — L233 Allergic contact dermatitis due to drugs in contact with skin: Secondary | ICD-10-CM | POA: Diagnosis not present

## 2019-05-29 DIAGNOSIS — E221 Hyperprolactinemia: Secondary | ICD-10-CM | POA: Diagnosis not present

## 2019-05-29 DIAGNOSIS — I10 Essential (primary) hypertension: Secondary | ICD-10-CM

## 2019-05-29 DIAGNOSIS — I2510 Atherosclerotic heart disease of native coronary artery without angina pectoris: Secondary | ICD-10-CM | POA: Diagnosis not present

## 2019-05-29 DIAGNOSIS — R351 Nocturia: Secondary | ICD-10-CM | POA: Diagnosis not present

## 2019-05-29 DIAGNOSIS — E785 Hyperlipidemia, unspecified: Secondary | ICD-10-CM | POA: Diagnosis not present

## 2019-05-29 LAB — COMPREHENSIVE METABOLIC PANEL
ALT: 22 U/L (ref 0–53)
AST: 19 U/L (ref 0–37)
Albumin: 4.2 g/dL (ref 3.5–5.2)
Alkaline Phosphatase: 60 U/L (ref 39–117)
BUN: 16 mg/dL (ref 6–23)
CO2: 31 mEq/L (ref 19–32)
Calcium: 9.4 mg/dL (ref 8.4–10.5)
Chloride: 105 mEq/L (ref 96–112)
Creatinine, Ser: 0.88 mg/dL (ref 0.40–1.50)
GFR: 88.82 mL/min (ref >60.00)
Glucose, Bld: 113 mg/dL — ABNORMAL HIGH (ref 70–99)
Potassium: 4.3 mEq/L (ref 3.5–5.1)
Sodium: 140 mEq/L (ref 135–145)
Total Bilirubin: 0.6 mg/dL (ref 0.2–1.2)
Total Protein: 6.3 g/dL (ref 6.0–8.3)

## 2019-05-29 LAB — PSA: PSA: 1.09 ng/mL (ref 0.10–4.00)

## 2019-05-29 LAB — LIPID PANEL
Cholesterol: 93 mg/dL (ref 0–200)
HDL: 45.9 mg/dL (ref >39.00)
LDL Cholesterol: 32 mg/dL (ref 0–99)
NonHDL: 46.93
Total CHOL/HDL Ratio: 2
Triglycerides: 73 mg/dL (ref 0.0–149.0)
VLDL: 14.6 mg/dL (ref 0.0–40.0)

## 2019-05-29 MED ORDER — TRIAMCINOLONE ACETONIDE 0.1 % EX CREA: 1.0000 "application " | TOPICAL_CREAM | Freq: Two times a day (BID) | CUTANEOUS | 2 refills | Status: DC

## 2019-05-29 NOTE — Assessment & Plan Note (Signed)
Well controlled, no changes to meds. Encouraged heart healthy diet such as the DASH diet and exercise as tolerated.  °

## 2019-05-29 NOTE — Assessment & Plan Note (Signed)
Pt sees cardiology Check labs  con't meds Pt c/o no symptoms

## 2019-05-29 NOTE — Progress Notes (Signed)
Patient ID: Michael Pearson, male    DOB: 09-Sep-1961  Age: 58 y.o. MRN: [id number]    Subjective:  Subjective  HPI AYDEEN BLUME presents for f/u bp and chol He c/o rash on his arms from chem they use at work.      Review of Systems  Constitutional: Negative for appetite change, diaphoresis, fatigue and unexpected weight change.  Eyes: Negative for pain, redness and visual disturbance.  Respiratory: Negative for cough, chest tightness, shortness of breath and wheezing.   Cardiovascular: Negative for chest pain, palpitations and leg swelling.  Endocrine: Negative for cold intolerance, heat intolerance, polydipsia, polyphagia and polyuria.  Genitourinary: Negative for difficulty urinating, dysuria and frequency.  Skin: Positive for rash.  Neurological: Negative for dizziness, light-headedness, numbness and headaches.    History Past Medical History:  Diagnosis Date   Allergy    Arthritis    Asthma    CAD (coronary artery disease)    NSTEMI 7/12:  Cardiac cath on 7/17: pLAD occluded, Dx 30-40%, pRCA 30-40%, mRCA 30-40%.  Proximal LAD was treated with a BMS.  Echo 7/19:  EF 60-65%, mild LVH.    ETT-Myoview 6/14:  Inf thinning, no ischemia, EF 64%, normal study   Cognitive communication deficit    Colitis    Diverticulosis    Duodenitis    May 2012 with heme positive stools at this time. Endorses only rare streaking of blood now.   External hemorrhoids    GERD (gastroesophageal reflux disease)    Heart murmur    as a child per the pt   Hiatal hernia    Hx of hemorrhoids    Hyperlipidemia    Hypertension    Kidney stones    Microadenoma ([place])    MRI in 2008 demonstrating 4.6 mm area of pituitary gland    Migraines    Has been evaluated multiple times in past for chronic headache in which pt has had occasional nose bleeds and bloodshot eyes. This lasted for 4-5 months. CT of the head was negative in May 2012.   Myocardial infarction ([place]) 05/17/2011   Pituitary  tumor    RBBB (right bundle branch block)    Noted on EKG in 2008   Ulcer     He has a past surgical history that includes Hernia repair; Tooth extraction; Hammer toe surgery; egd (03/05/2011); Flexible sigmoidoscopy (03/05/2011); Heart stent; Upper gastrointestinal endoscopy; Colonoscopy; Sigmoidoscopy; and ORIF tibia plateau (Left, 08/04/2018).   His family history includes Cancer in his father; Colon cancer in his maternal aunt; Colon polyps in his maternal aunt and paternal grandmother; Coronary artery disease in his brother; Heart attack (age of onset: 23) in his father; Hypertension in his brother; Obesity in his brother; Skin cancer in his father; Stroke in his mother.He reports that he has never smoked. He has never used smokeless tobacco. He reports that he does not drink alcohol or use drugs.  Current Outpatient Medications on File Prior to Visit  Medication Sig Dispense Refill   acetaminophen (TYLENOL) 500 MG tablet Take 1 tablet (500 mg total) by mouth every 12 (twelve) hours. 60 tablet 0   albuterol (PROVENTIL HFA;VENTOLIN HFA) 108 (90 Base) MCG/ACT inhaler Inhale 2 puffs into the lungs every 4 (four) hours as needed for wheezing or shortness of breath. 1 Inhaler 2   aspirin 81 MG tablet Take 81 mg by mouth daily.       atorvastatin (LIPITOR) 40 MG tablet TAKE ONE TABLET BY MOUTH ONE TIME DAILY  90 tablet 0   bromocriptine (PARLODEL) 2.5 MG tablet Take 1 tablet (2.5 mg total) by mouth 2 (two) times daily. 180 tablet 2   cetirizine (ZYRTEC) 10 MG tablet Take 10 mg by mouth daily as needed for allergies.      docusate sodium (COLACE) 100 MG capsule Take 1 capsule (100 mg total) by mouth 2 (two) times daily. 20 capsule 0   enoxaparin (LOVENOX) 40 MG/0.4ML injection Inject 0.4 mLs (40 mg total) into the skin daily. 30 Syringe 0   ezetimibe (ZETIA) 10 MG tablet TAKE ONE TABLET BY MOUTH ONE TIME DAILY  90 tablet 0   HYDROcodone-acetaminophen (NORCO) 7.5-325 MG tablet Take 1-2  tablets by mouth every 6 (six) hours as needed for moderate pain or severe pain. 50 tablet 0   losartan (COZAAR) 50 MG tablet TAKE ONE TABLET BY MOUTH ONE TIME DAILY  90 tablet 0   methocarbamol (ROBAXIN) 500 MG tablet Take 1 tablet (500 mg total) by mouth every 8 (eight) hours as needed for muscle spasms. 60 tablet 0   metoprolol tartrate (LOPRESSOR) 25 MG tablet TAKE HALF TABLET BY MOUTH TWICE DAILY  90 tablet 0   mometasone (NASONEX) 50 MCG/ACT nasal spray Place 2 sprays into the nose daily. (Patient taking differently: Place 2 sprays into the nose daily as needed (congestion). ) 17 g 2   Multiple Vitamins-Minerals (MENS ONE DAILY PO) Take 1 tablet by mouth daily.     omeprazole (PRILOSEC) 40 MG capsule Take 1 capsule (40 mg total) by mouth daily. 30 minutes before breakfast (Patient taking differently: Take 40 mg by mouth at bedtime. 30 minutes before breakfast) 30 capsule 3   predniSONE (DELTASONE) 10 MG tablet TAKE 3 TABLETS PO QD FOR 3 DAYS THEN TAKE 2 TABLETS PO QD FOR 3 DAYS THEN TAKE 1 TABLET PO QD FOR 3 DAYS THEN TAKE 1/2 TAB PO QD FOR 3 DAYS 20 tablet 0   traMADol (ULTRAM) 50 MG tablet Take 1 tablet (50 mg total) by mouth every 6 (six) hours as needed for severe pain. 30 tablet 0   valACYclovir (VALTREX) 1000 MG tablet Take 1 tablet (1,000 mg total) by mouth 3 (three) times daily. 30 tablet 0   No current facility-administered medications on file prior to visit.      Objective:  Objective  Physical Exam Vitals signs and nursing note reviewed.  Constitutional:      General: He is sleeping.     Appearance: He is well-developed.  HENT:     Head: Normocephalic and atraumatic.  Eyes:     Pupils: Pupils are equal, round, and reactive to light.  Neck:     Musculoskeletal: Normal range of motion and neck supple.     Thyroid: No thyromegaly.  Cardiovascular:     Rate and Rhythm: Normal rate and regular rhythm.     Heart sounds: No murmur.  Pulmonary:     Effort: Pulmonary  effort is normal. No respiratory distress.     Breath sounds: Normal breath sounds. No wheezing or rales.  Chest:     Chest wall: No tenderness.  Musculoskeletal:        General: No tenderness.  Skin:    General: Skin is warm and dry.     Findings: Erythema and rash present.       Neurological:     Mental Status: He is oriented to person, place, and time.  Psychiatric:        Behavior: Behavior normal.  Thought Content: Thought content normal.        Judgment: Judgment normal.    BP 114/68 (BP Location: Left Arm, Patient Position: Sitting, Cuff Size: Normal)    Pulse (!) 56    Temp 97.9 F (36.6 C) (Oral)    Resp 18    Ht 6\' 2"  (1.88 m)    Wt 263 lb 9.6 oz (119.6 kg)    SpO2 99%    BMI 33.84 kg/m  Wt Readings from Last 3 Encounters:  05/29/19 263 lb 9.6 oz (119.6 kg)  05/02/19 264 lb 6.4 oz (119.9 kg)  12/28/18 287 lb (130.2 kg)     Lab Results  Component Value Date   WBC 6.6 08/06/2018   HGB 12.0 (L) 08/06/2018   HCT 37.2 (L) 08/06/2018   PLT 174 08/06/2018   GLUCOSE 105 (H) 05/02/2019   CHOL 108 11/27/2018   TRIG 70.0 11/27/2018   HDL 52.10 11/27/2018   LDLCALC 42 11/27/2018   ALT 60 (H) 11/27/2018   AST 48 (H) 11/27/2018   NA 139 05/02/2019   K 4.7 05/02/2019   CL 104 05/02/2019   CREATININE 0.91 05/02/2019   BUN 12 05/02/2019   CO2 30 05/02/2019   TSH 0.564 08/05/2018   PSA 0.33 11/23/2016   INR 0.97 05/17/2011   HGBA1C 5.8 09/17/2014   MICROALBUR 1.5 11/20/2015    Dg Tibia/fibula Left  Result Date: 08/04/2018 CLINICAL DATA:  Tibial plateau fracture EXAM: LEFT TIBIA AND FIBULA - 2 VIEW; DG C-ARM 61-120 MIN COMPARISON:  CT 08/03/2018, radiograph 08/03/2018 FINDINGS: Eight low resolution intraoperative spot views of the left tibia and fibula. Total fluoroscopy time was 52 seconds. Images were obtained during operative placement of medial and lateral surgical plate and multiple fixating screws across proximal tibial fracture. Anatomic alignment on the  images submitted. IMPRESSION: Intraoperative fluoroscopic assistance provided during surgical fixation of proximal tibia fracture Electronically Signed   By: Donavan Foil M.D.   On: 08/04/2018 14:14   Mr Jeri Cos MW Contrast  Result Date: 08/04/2018 CLINICAL DATA:  Follow-up pituitary tumor. EXAM: MRI HEAD WITHOUT AND WITH CONTRAST TECHNIQUE: Multiplanar, multiecho pulse sequences of the brain and surrounding structures were obtained without and with intravenous contrast. CONTRAST:  5 mL Gadavist COMPARISON:  06/19/2016 FINDINGS: Brain: There is no evidence of acute infarct, intracranial hemorrhage, midline shift, or extra-axial fluid collection. The ventricles and sulci are within normal limits for age. No significant white matter disease is evident. Dedicated pituitary imaging was performed and is mildly to moderately motion degraded. A hypoenhancing mass is again seen in the right aspect of the pituitary gland, however precise measurement is limited by motion. The lesion appears to have mildly decreased in size with the superior margin of the gland now being essentially flat where as it was convex before. The mass is estimated to measure 8 x 6 mm (transverse x craniocaudal) on coronal dynamic post-contrast imaging (series 1102, image 3, previously 8 x 9 mm when measured similarly). No definite cavernous sinus invasion is currently evident. The infundibulum is mildly deviated leftward. The optic chiasm is unremarkable. Vascular: Major intracranial vascular flow voids are preserved. Skull and upper cervical spine: Unremarkable bone marrow signal. Sinuses/Orbits: Unremarkable orbits. Paranasal sinuses and mastoid air cells are clear. Other: None. IMPRESSION: 1. Motion degraded examination with slight decrease in size of pituitary adenoma, now 8 mm. 2. Otherwise unremarkable appearance of the brain. Electronically Signed   By: Logan Bores M.D.   On: 08/04/2018 19:29  Ct Tibia Fibula Left Wo Contrast  Result  Date: 08/03/2018 CLINICAL DATA:  Left lower leg injury secondary to a trip and fall today while walking. Initial encounter. EXAM: CT OF THE LOWER LEFT EXTREMITY WITHOUT CONTRAST TECHNIQUE: Multidetector CT imaging of the lower left extremity was performed according to the standard protocol. COMPARISON:  Plain films left lower leg this same scratch the plain films left knee this same day. FINDINGS: Bones/Joint/Cartilage As seen on the comparison plain films, the patient has a nondisplaced fracture of the proximal tibia. The main component of the fracture is obliquely transverse in orientation originating in the lateral metaphysis 5 cm below the joint line and extending inferiorly through the medial metaphysis approximately 10 cm below the joint line. A longitudinal fracture line extends cephalad through the medial tibial eminence. The tibial plateaus are preserved. No other fracture is identified. Ligaments Suboptimally assessed by CT. The cruciate and collateral ligaments appear intact. Muscles and Tendons Intact and normal in appearance. Soft tissues Small lipohemarthrosis is noted. IMPRESSION: Nondisplaced fracture of the proximal metaphysis and diaphysis of the tibia includes a longitudinal component extending cephalad through the medial tibial eminence. The tibial plateaus are intact. No other fracture is identified. Electronically Signed   By: Inge Rise M.D.   On: 08/03/2018 16:05   Dg Chest Port 1 View  Result Date: 08/04/2018 CLINICAL DATA:  Tachypnea. EXAM: PORTABLE CHEST 1 VIEW COMPARISON:  01/21/2018.  05/17/2011. FINDINGS: Mild mediastinal and right hilar fullness, this is most likely vascular. Upright PA lateral chest x-ray suggested for further evaluation. Mild bibasilar atelectasis. No pleural effusion or pneumothorax. Heart size normal. IMPRESSION: 1. Mild mediastinal and right hilar fullness. This is most likely vascular related to portable technique. PA lateral chest x-ray suggested for  further evaluation. 2.  Mild bibasilar subsegmental atelectasis. Electronically Signed   By: Marcello Moores  Register   On: 08/04/2018 14:09   Dg Knee Complete 4 Views Left  Result Date: 08/03/2018 CLINICAL DATA:  Acute LEFT knee pain following fall today. Initial encounter. EXAM: LEFT KNEE - COMPLETE 4+ VIEW COMPARISON:  None. FINDINGS: A mildly comminuted proximal tibial fracture is noted from the interspinous region inferiorly 10 cm to the MEDIAL cortex. 2 mm LATERAL displacement at the MEDIAL cortex fracture noted. Small lipohemarthrosis identified. No dislocation. IMPRESSION: Mildly comminuted intra-articular proximal tibial fracture extending inferiorly from the articular surface 10 cm. Electronically Signed   By: Margarette Canada M.D.   On: 08/03/2018 13:24   Dg Knee Left Port  Result Date: 08/04/2018 CLINICAL DATA:  Fracture repair EXAM: PORTABLE LEFT KNEE - 1-2 VIEW COMPARISON:  None. FINDINGS: The patient's proximal tibial fracture has been repaired with a sideplate and screws. Hardware is in good position within visualize limits. The distal most aspect of the hardware is not included on today's study. IMPRESSION: Left tibial fracture repair as above. Electronically Signed   By: Dorise Bullion III M.D   On: 08/04/2018 21:37   Dg C-arm 1-60 Min  Result Date: 08/04/2018 CLINICAL DATA:  Tibial plateau fracture EXAM: LEFT TIBIA AND FIBULA - 2 VIEW; DG C-ARM 61-120 MIN COMPARISON:  CT 08/03/2018, radiograph 08/03/2018 FINDINGS: Eight low resolution intraoperative spot views of the left tibia and fibula. Total fluoroscopy time was 52 seconds. Images were obtained during operative placement of medial and lateral surgical plate and multiple fixating screws across proximal tibial fracture. Anatomic alignment on the images submitted. IMPRESSION: Intraoperative fluoroscopic assistance provided during surgical fixation of proximal tibia fracture Electronically Signed   By: Maudie Mercury  Francoise Ceo M.D.   On: 08/04/2018 14:14    Dg C-arm 1-60 Min  Result Date: 08/04/2018 CLINICAL DATA:  Tibial plateau fracture EXAM: LEFT TIBIA AND FIBULA - 2 VIEW; DG C-ARM 61-120 MIN COMPARISON:  CT 08/03/2018, radiograph 08/03/2018 FINDINGS: Eight low resolution intraoperative spot views of the left tibia and fibula. Total fluoroscopy time was 52 seconds. Images were obtained during operative placement of medial and lateral surgical plate and multiple fixating screws across proximal tibial fracture. Anatomic alignment on the images submitted. IMPRESSION: Intraoperative fluoroscopic assistance provided during surgical fixation of proximal tibia fracture Electronically Signed   By: Donavan Foil M.D.   On: 08/04/2018 14:14     Assessment & Plan:  Plan  I am having Jeannine Boga start on triamcinolone cream. I am also having him maintain his aspirin, cetirizine, omeprazole, mometasone, albuterol, Multiple Vitamins-Minerals (MENS ONE DAILY PO), acetaminophen, HYDROcodone-acetaminophen, docusate sodium, enoxaparin, methocarbamol, bromocriptine, valACYclovir, predniSONE, traMADol, ezetimibe, metoprolol tartrate, losartan, and atorvastatin.  Meds ordered this encounter  Medications   triamcinolone cream (KENALOG) 0.1 %    Sig: Apply 1 application topically 2 (two) times daily.    Dispense:  30 g    Refill:  2    Problem List Items Addressed This Visit      Unprioritized   CAD (coronary artery disease)    Pt sees cardiology Check labs  con't meds Pt c/o no symptoms      Essential hypertension - Primary    Well controlled, no changes to meds. Encouraged heart healthy diet such as the DASH diet and exercise as tolerated.       Relevant Orders   Lipid panel   Comprehensive metabolic panel   Hyperlipidemia    Tolerating statin, encouraged heart healthy diet, avoid trans fats, minimize simple carbs and saturated fats. Increase exercise as tolerated      Relevant Orders   Lipid panel   Comprehensive metabolic panel    Hyperprolactinemia ([facility])    Other Visit Diagnoses    Allergic contact dermatitis due to drugs in contact with skin       Relevant Medications   triamcinolone cream (KENALOG) 0.1 %   Nocturia       Relevant Orders   PSA      Follow-up: Return in about 6 months (around 11/29/2019) for hypertension, hyperlipidemia.  [name], DO

## 2019-05-29 NOTE — Patient Instructions (Signed)

## 2019-05-29 NOTE — Assessment & Plan Note (Signed)
Tolerating statin, encouraged heart healthy diet, avoid trans fats, minimize simple carbs and saturated fats. Increase exercise as tolerated 

## 2019-05-30 ENCOUNTER — Other Ambulatory Visit: Payer: Self-pay | Admitting: Family Medicine

## 2019-05-30 DIAGNOSIS — R972 Elevated prostate specific antigen [PSA]: Secondary | ICD-10-CM

## 2019-05-30 DIAGNOSIS — R739 Hyperglycemia, unspecified: Secondary | ICD-10-CM

## 2019-05-30 DIAGNOSIS — E785 Hyperlipidemia, unspecified: Secondary | ICD-10-CM

## 2019-06-14 ENCOUNTER — Other Ambulatory Visit: Payer: Self-pay

## 2019-06-14 ENCOUNTER — Ambulatory Visit
Admission: RE | Admit: 2019-06-14 | Discharge: 2019-06-14 | Disposition: A | Discharge status: Home / Self Care | Payer: Medicare Other | Source: Ambulatory Visit | Attending: Endocrinology | Admitting: Endocrinology

## 2019-06-14 DIAGNOSIS — D352 Benign neoplasm of pituitary gland: Secondary | ICD-10-CM | POA: Diagnosis not present

## 2019-06-14 MED ORDER — GADOBENATE DIMEGLUMINE 529 MG/ML IV SOLN
10.0000 mL | Freq: Once | INTRAVENOUS | Status: AC | PRN
  Administered 2019-06-14: 10 mL via INTRAVENOUS

## 2019-07-04 ENCOUNTER — Ambulatory Visit (INDEPENDENT_AMBULATORY_CARE_PROVIDER_SITE_OTHER): Payer: Medicare Other | Admitting: Endocrinology

## 2019-07-04 ENCOUNTER — Encounter: Payer: Self-pay | Admitting: Endocrinology

## 2019-07-04 ENCOUNTER — Other Ambulatory Visit: Payer: Self-pay

## 2019-07-04 VITALS — BP 124/62 | HR 60 | Ht 74.0 in | Wt 262.6 lb

## 2019-07-04 DIAGNOSIS — D352 Benign neoplasm of pituitary gland: Secondary | ICD-10-CM | POA: Diagnosis not present

## 2019-07-04 DIAGNOSIS — R609 Edema, unspecified: Secondary | ICD-10-CM | POA: Diagnosis not present

## 2019-07-04 DIAGNOSIS — I2510 Atherosclerotic heart disease of native coronary artery without angina pectoris: Secondary | ICD-10-CM | POA: Diagnosis not present

## 2019-07-04 DIAGNOSIS — E221 Hyperprolactinemia: Secondary | ICD-10-CM

## 2019-07-04 DIAGNOSIS — R5383 Other fatigue: Secondary | ICD-10-CM | POA: Diagnosis not present

## 2019-07-04 NOTE — Progress Notes (Signed)
Subjective:    Patient ID: Michael Pearson, male    DOB: 1961/08/07, 58 y.o.   MRN: [id number]  HPI Pt returns for f/u of pituitary microprolactinoma (dx'ed 2005, adenoma was incidentally noted to have a pituitary adenoma; in 2017, elev. prolactin was noted; f/u MRI in 2019 showed slight interval growth; h/o near-syncope limits bromocriptine dosage; other pituitary functions are normal; f/u MRI in 2020 was unchanged).   pt states slight swelling of the legs, and assoc fatigue.   Past Medical History:  Diagnosis Date  . Allergy   . Arthritis   . Asthma   . CAD (coronary artery disease)    NSTEMI 7/12:  Cardiac cath on 7/17: pLAD occluded, Dx 30-40%, pRCA 30-40%, mRCA 30-40%.  Proximal LAD was treated with a BMS.  Echo 7/19:  EF 60-65%, mild LVH.    ETT-Myoview 6/14:  Inf thinning, no ischemia, EF 64%, normal study  . Cognitive communication deficit   . Colitis   . Diverticulosis   . Duodenitis    May 2012 with heme positive stools at this time. Endorses only rare streaking of blood now.  . External hemorrhoids   . GERD (gastroesophageal reflux disease)   . Heart murmur    as a child per the pt  . Hiatal hernia   . Hx of hemorrhoids   . Hyperlipidemia   . Hypertension   . Kidney stones   . Microadenoma ([place])    MRI in 2008 demonstrating 4.6 mm area of pituitary gland   . Migraines    Has been evaluated multiple times in past for chronic headache in which pt has had occasional nose bleeds and bloodshot eyes. This lasted for 4-5 months. CT of the head was negative in May 2012.  . Myocardial infarction ([place]) 05/17/2011  . Pituitary tumor   . RBBB (right bundle branch block)    Noted on EKG in 2008  . Ulcer     Past Surgical History:  Procedure Laterality Date  . COLONOSCOPY    . egd  03/05/2011   Dr. Benson Norway  . FLEXIBLE SIGMOIDOSCOPY  03/05/2011   Dr. Benson Norway  . HAMMER TOE SURGERY    . Heart stent    . HERNIA REPAIR     In 20's  . ORIF TIBIA PLATEAU Left 08/04/2018   Procedure: OPEN  REDUCTION INTERNAL FIXATION (ORIF) TIBIAL PLATEAU;  Surgeon: Altamese Elk Creek, MD;  Location: [facility];  Service: Orthopedics;  Laterality: Left;  . SIGMOIDOSCOPY    . TOOTH EXTRACTION    . UPPER GASTROINTESTINAL ENDOSCOPY      Social History   Socioeconomic History  . Marital status: Single    Spouse name: Not on file  . Number of children: Not on file  . Years of education: Not on file  . Highest education level: Not on file  Occupational History  . Occupation: Personnel officer-- cleans 3 buildings    Comment: Fairly physical job    Employer: [employer]  Social Needs  . Financial resource strain: Not on file  . Food insecurity    Worry: Not on file    Inability: Not on file  . Transportation needs    Medical: Not on file    Non-medical: Not on file  Tobacco Use  . Smoking status: Never Smoker  . Smokeless tobacco: Never Used  Substance and Sexual Activity  . Alcohol use: No  . Drug use: No  . Sexual activity: Not Currently  Lifestyle  . Physical activity  Days per week: Not on file    Minutes per session: Not on file  . Stress: Not on file  Relationships  . Social Herbalist on phone: Not on file    Gets together: Not on file    Attends religious service: Not on file    Active member of club or organization: Not on file    Attends meetings of clubs or organizations: Not on file    Relationship status: Not on file  . Intimate partner violence    Fear of current or ex partner: Not on file    Emotionally abused: Not on file    Physically abused: Not on file    Forced sexual activity: Not on file  Other Topics Concern  . Not on file  Social History Narrative   Lives with brother and mother   Mother is quite sick, he and his brother take turns taking care of her   No children    Current Outpatient Medications on File Prior to Visit  Medication Sig Dispense Refill  . acetaminophen (TYLENOL) 500 MG tablet Take 1 tablet (500 mg total) by mouth every  12 (twelve) hours. 60 tablet 0  . albuterol (PROVENTIL HFA;VENTOLIN HFA) 108 (90 Base) MCG/ACT inhaler Inhale 2 puffs into the lungs every 4 (four) hours as needed for wheezing or shortness of breath. 1 Inhaler 2  . aspirin 81 MG tablet Take 81 mg by mouth daily.      Marland Kitchen atorvastatin (LIPITOR) 40 MG tablet TAKE ONE TABLET BY MOUTH ONE TIME DAILY  90 tablet 0  . bromocriptine (PARLODEL) 2.5 MG tablet Take 1 tablet (2.5 mg total) by mouth 2 (two) times daily. 180 tablet 2  . cetirizine (ZYRTEC) 10 MG tablet Take 10 mg by mouth daily as needed for allergies.     Marland Kitchen docusate sodium (COLACE) 100 MG capsule Take 1 capsule (100 mg total) by mouth 2 (two) times daily. 20 capsule 0  . enoxaparin (LOVENOX) 40 MG/0.4ML injection Inject 0.4 mLs (40 mg total) into the skin daily. 30 Syringe 0  . ezetimibe (ZETIA) 10 MG tablet TAKE ONE TABLET BY MOUTH ONE TIME DAILY  90 tablet 0  . HYDROcodone-acetaminophen (NORCO) 7.5-325 MG tablet Take 1-2 tablets by mouth every 6 (six) hours as needed for moderate pain or severe pain. 50 tablet 0  . losartan (COZAAR) 50 MG tablet TAKE ONE TABLET BY MOUTH ONE TIME DAILY  90 tablet 0  . methocarbamol (ROBAXIN) 500 MG tablet Take 1 tablet (500 mg total) by mouth every 8 (eight) hours as needed for muscle spasms. 60 tablet 0  . metoprolol tartrate (LOPRESSOR) 25 MG tablet TAKE HALF TABLET BY MOUTH TWICE DAILY  90 tablet 0  . mometasone (NASONEX) 50 MCG/ACT nasal spray Place 2 sprays into the nose daily. (Patient taking differently: Place 2 sprays into the nose daily as needed (congestion). ) 17 g 2  . Multiple Vitamins-Minerals (MENS ONE DAILY PO) Take 1 tablet by mouth daily.    Marland Kitchen omeprazole (PRILOSEC) 40 MG capsule Take 1 capsule (40 mg total) by mouth daily. 30 minutes before breakfast (Patient taking differently: Take 40 mg by mouth at bedtime. 30 minutes before breakfast) 30 capsule 3  . predniSONE (DELTASONE) 10 MG tablet TAKE 3 TABLETS PO QD FOR 3 DAYS THEN TAKE 2 TABLETS PO  QD FOR 3 DAYS THEN TAKE 1 TABLET PO QD FOR 3 DAYS THEN TAKE 1/2 TAB PO QD FOR 3 DAYS 20 tablet 0  .  traMADol (ULTRAM) 50 MG tablet Take 1 tablet (50 mg total) by mouth every 6 (six) hours as needed for severe pain. 30 tablet 0  . triamcinolone cream (KENALOG) 0.1 % Apply 1 application topically 2 (two) times daily. 30 g 2  . valACYclovir (VALTREX) 1000 MG tablet Take 1 tablet (1,000 mg total) by mouth 3 (three) times daily. 30 tablet 0   No current facility-administered medications on file prior to visit.     No Known Allergies  Family History  Problem Relation Age of Onset  . Stroke Mother   . Heart attack Father 8       MI  . Skin cancer Father   . Cancer Father        ? type "rare"  . Obesity Brother   . Coronary artery disease Brother        Possible, pt not sure of specifics  . Hypertension Brother   . Colon polyps Maternal Aunt   . Colon cancer Maternal Aunt   . Colon polyps Paternal Grandmother   . Other Neg Hx   . Esophageal cancer Neg Hx   . Rectal cancer Neg Hx   . Stomach cancer Neg Hx     BP 124/62 (BP Location: Left Arm, Patient Position: Sitting, Cuff Size: Large)   Pulse 60   Ht 6\' 2"  (1.88 m)   Wt 262 lb 9.6 oz (119.1 kg)   SpO2 96%   BMI 33.72 kg/m   Review of Systems Denies dizziness, sob, and LOC.       Objective:   Physical Exam VITAL SIGNS:  See vs page GENERAL: no distress.  Ext: 1+ bilat leg edema.   Prolactin=6    Assessment & Plan:  Hyperprolactinemia: well-controlled Pituitary microadenoma, not prolactinoma.  Fatigue, not related to the prolactin Edema: mild.  Could be related to parlodel.  We'll follow for now.   Patient Instructions  Please continue the same medications.  Please come back for a follow-up appointment in 6 months.

## 2019-07-04 NOTE — Patient Instructions (Addendum)
Please continue the same medications.   Please come back for a follow-up appointment in 6 months.    

## 2019-08-15 ENCOUNTER — Telehealth: Payer: Self-pay | Admitting: Family Medicine

## 2019-08-15 NOTE — Telephone Encounter (Signed)
Pt needs office visit please 

## 2019-08-15 NOTE — Telephone Encounter (Signed)
LM for pt to call and schedule in person or virtual visit to discuss concerns

## 2019-08-15 NOTE — Telephone Encounter (Signed)
Patient called and said that he has kidney stones and would like to know if Dr. Etter Sjogren can call him something in so he can pass them. If any question please call patient back, thanks.

## 2019-08-17 ENCOUNTER — Other Ambulatory Visit: Payer: Self-pay | Admitting: Family Medicine

## 2019-08-17 DIAGNOSIS — I10 Essential (primary) hypertension: Secondary | ICD-10-CM

## 2019-08-17 DIAGNOSIS — E785 Hyperlipidemia, unspecified: Secondary | ICD-10-CM

## 2019-08-17 DIAGNOSIS — I2510 Atherosclerotic heart disease of native coronary artery without angina pectoris: Secondary | ICD-10-CM

## 2019-10-05 ENCOUNTER — Other Ambulatory Visit: Payer: Self-pay | Admitting: Endocrinology

## 2019-11-10 ENCOUNTER — Other Ambulatory Visit: Payer: Self-pay | Admitting: Family Medicine

## 2019-11-10 DIAGNOSIS — I10 Essential (primary) hypertension: Secondary | ICD-10-CM

## 2019-11-10 DIAGNOSIS — E785 Hyperlipidemia, unspecified: Secondary | ICD-10-CM

## 2019-11-10 DIAGNOSIS — I2510 Atherosclerotic heart disease of native coronary artery without angina pectoris: Secondary | ICD-10-CM

## 2019-11-29 ENCOUNTER — Encounter: Payer: Self-pay | Admitting: Family Medicine

## 2019-11-29 ENCOUNTER — Other Ambulatory Visit: Payer: Self-pay

## 2019-11-29 ENCOUNTER — Ambulatory Visit (INDEPENDENT_AMBULATORY_CARE_PROVIDER_SITE_OTHER): Payer: Medicare Other | Admitting: Family Medicine

## 2019-11-29 VITALS — BP 110/82 | HR 63 | Temp 97.5°F | Resp 18 | Ht 74.0 in | Wt 252.6 lb

## 2019-11-29 DIAGNOSIS — E785 Hyperlipidemia, unspecified: Secondary | ICD-10-CM

## 2019-11-29 DIAGNOSIS — I10 Essential (primary) hypertension: Secondary | ICD-10-CM

## 2019-11-29 DIAGNOSIS — D352 Benign neoplasm of pituitary gland: Secondary | ICD-10-CM | POA: Diagnosis not present

## 2019-11-29 DIAGNOSIS — I2510 Atherosclerotic heart disease of native coronary artery without angina pectoris: Secondary | ICD-10-CM | POA: Diagnosis not present

## 2019-11-29 DIAGNOSIS — I214 Non-ST elevation (NSTEMI) myocardial infarction: Secondary | ICD-10-CM | POA: Diagnosis not present

## 2019-11-29 DIAGNOSIS — E229 Hyperfunction of pituitary gland, unspecified: Secondary | ICD-10-CM | POA: Diagnosis not present

## 2019-11-29 LAB — LIPID PANEL
Cholesterol: 106 mg/dL (ref 0–200)
HDL: 51.7 mg/dL (ref >39.00)
LDL Cholesterol: 40 mg/dL (ref 0–99)
NonHDL: 53.88
Total CHOL/HDL Ratio: 2
Triglycerides: 70 mg/dL (ref 0.0–149.0)
VLDL: 14 mg/dL (ref 0.0–40.0)

## 2019-11-29 LAB — COMPREHENSIVE METABOLIC PANEL
ALT: 37 U/L (ref 0–53)
AST: 25 U/L (ref 0–37)
Albumin: 4.4 g/dL (ref 3.5–5.2)
Alkaline Phosphatase: 66 U/L (ref 39–117)
BUN: 12 mg/dL (ref 6–23)
CO2: 27 mEq/L (ref 19–32)
Calcium: 9.5 mg/dL (ref 8.4–10.5)
Chloride: 105 mEq/L (ref 96–112)
Creatinine, Ser: 0.69 mg/dL (ref 0.40–1.50)
GFR: 117.39 mL/min (ref >60.00)
Glucose, Bld: 104 mg/dL — ABNORMAL HIGH (ref 70–99)
Potassium: 4 mEq/L (ref 3.5–5.1)
Sodium: 140 mEq/L (ref 135–145)
Total Bilirubin: 0.8 mg/dL (ref 0.2–1.2)
Total Protein: 6.5 g/dL (ref 6.0–8.3)

## 2019-11-29 NOTE — Assessment & Plan Note (Signed)
Per cardiology con't meds 

## 2019-11-29 NOTE — Assessment & Plan Note (Signed)
Well controlled, no changes to meds. Encouraged heart healthy diet such as the DASH diet and exercise as tolerated.  °

## 2019-11-29 NOTE — Progress Notes (Signed)
Patient ID: Michael Pearson, male    DOB: 1961-02-23  Age: 59 y.o. MRN: [id number]    Subjective:  Subjective  HPI Michael Pearson presents for f/u bp and cholesterol and f/u prolactin    No complaints   Review of Systems  Constitutional: Negative for appetite change, diaphoresis, fatigue and unexpected weight change.  Eyes: Negative for pain, redness and visual disturbance.  Respiratory: Negative for cough, chest tightness, shortness of breath and wheezing.   Cardiovascular: Negative for chest pain, palpitations and leg swelling.  Endocrine: Negative for cold intolerance, heat intolerance, polydipsia, polyphagia and polyuria.  Genitourinary: Negative for difficulty urinating, dysuria and frequency.  Neurological: Negative for dizziness, light-headedness, numbness and headaches.    History Past Medical History:  Diagnosis Date  . Allergy   . Arthritis   . Asthma   . CAD (coronary artery disease)    NSTEMI 7/12:  Cardiac cath on 7/17: pLAD occluded, Dx 30-40%, pRCA 30-40%, mRCA 30-40%.  Proximal LAD was treated with a BMS.  Echo 7/19:  EF 60-65%, mild LVH.    ETT-Myoview 6/14:  Inf thinning, no ischemia, EF 64%, normal study  . Cognitive communication deficit   . Colitis   . Diverticulosis   . Duodenitis    May 2012 with heme positive stools at this time. Endorses only rare streaking of blood now.  . External hemorrhoids   . GERD (gastroesophageal reflux disease)   . Heart murmur    as a child per the pt  . Hiatal hernia   . Hx of hemorrhoids   . Hyperlipidemia   . Hypertension   . Kidney stones   . Microadenoma    MRI in 2008 demonstrating 4.6 mm area of pituitary gland   . Migraines    Has been evaluated multiple times in past for chronic headache in which pt has had occasional nose bleeds and bloodshot eyes. This lasted for 4-5 months. CT of the head was negative in May 2012.  . Myocardial infarction ([place]) 05/17/2011  . Pituitary tumor   . RBBB (right bundle branch block)    Noted on EKG in 2008  . Ulcer     He has a past surgical history that includes Hernia repair; Tooth extraction; Hammer toe surgery; egd (03/05/2011); Flexible sigmoidoscopy (03/05/2011); Heart stent; Upper gastrointestinal endoscopy; Colonoscopy; Sigmoidoscopy; and ORIF tibia plateau (Left, 08/04/2018).   His family history includes Cancer in his father; Colon cancer in his maternal aunt; Colon polyps in his maternal aunt and paternal grandmother; Coronary artery disease in his brother; Heart attack (age of onset: 50) in his father; Hypertension in his brother; Obesity in his brother; Skin cancer in his father; Stroke in his mother.He reports that he has never smoked. He has never used smokeless tobacco. He reports that he does not drink alcohol or use drugs.  Current Outpatient Medications on File Prior to Visit  Medication Sig Dispense Refill  . acetaminophen (TYLENOL) 500 MG tablet Take 1 tablet (500 mg total) by mouth every 12 (twelve) hours. 60 tablet 0  . albuterol (PROVENTIL HFA;VENTOLIN HFA) 108 (90 Base) MCG/ACT inhaler Inhale 2 puffs into the lungs every 4 (four) hours as needed for wheezing or shortness of breath. 1 Inhaler 2  . aspirin 81 MG tablet Take 81 mg by mouth daily.      Marland Kitchen atorvastatin (LIPITOR) 40 MG tablet TAKE ONE TABLET BY MOUTH ONE TIME DAILY  90 tablet 0  . bromocriptine (PARLODEL) 2.5 MG tablet TAKE ONE TABLET BY  MOUTH TWICE DAILY  180 tablet 0  . cetirizine (ZYRTEC) 10 MG tablet Take 10 mg by mouth daily as needed for allergies.     Marland Kitchen docusate sodium (COLACE) 100 MG capsule Take 1 capsule (100 mg total) by mouth 2 (two) times daily. 20 capsule 0  . enoxaparin (LOVENOX) 40 MG/0.4ML injection Inject 0.4 mLs (40 mg total) into the skin daily. 30 Syringe 0  . ezetimibe (ZETIA) 10 MG tablet TAKE ONE TABLET BY MOUTH ONE TIME DAILY  90 tablet 0  . HYDROcodone-acetaminophen (NORCO) 7.5-325 MG tablet Take 1-2 tablets by mouth every 6 (six) hours as needed for moderate pain or severe  pain. 50 tablet 0  . losartan (COZAAR) 50 MG tablet TAKE ONE TABLET BY MOUTH ONE TIME DAILY  90 tablet 0  . methocarbamol (ROBAXIN) 500 MG tablet Take 1 tablet (500 mg total) by mouth every 8 (eight) hours as needed for muscle spasms. 60 tablet 0  . metoprolol tartrate (LOPRESSOR) 25 MG tablet TAKE HALF TABLET BY MOUTH TWICE DAILY  90 tablet 0  . mometasone (NASONEX) 50 MCG/ACT nasal spray Place 2 sprays into the nose daily. (Patient taking differently: Place 2 sprays into the nose daily as needed (congestion). ) 17 g 2  . Multiple Vitamins-Minerals (MENS ONE DAILY PO) Take 1 tablet by mouth daily.    Marland Kitchen omeprazole (PRILOSEC) 40 MG capsule Take 1 capsule (40 mg total) by mouth daily. 30 minutes before breakfast (Patient taking differently: Take 40 mg by mouth at bedtime. 30 minutes before breakfast) 30 capsule 3  . predniSONE (DELTASONE) 10 MG tablet TAKE 3 TABLETS PO QD FOR 3 DAYS THEN TAKE 2 TABLETS PO QD FOR 3 DAYS THEN TAKE 1 TABLET PO QD FOR 3 DAYS THEN TAKE 1/2 TAB PO QD FOR 3 DAYS 20 tablet 0  . traMADol (ULTRAM) 50 MG tablet Take 1 tablet (50 mg total) by mouth every 6 (six) hours as needed for severe pain. 30 tablet 0  . triamcinolone cream (KENALOG) 0.1 % Apply 1 application topically 2 (two) times daily. 30 g 2  . valACYclovir (VALTREX) 1000 MG tablet Take 1 tablet (1,000 mg total) by mouth 3 (three) times daily. 30 tablet 0   No current facility-administered medications on file prior to visit.     Objective:  Objective  Physical Exam Vitals and nursing note reviewed.  Constitutional:      General: He is sleeping.     Appearance: He is well-developed.  HENT:     Head: Normocephalic and atraumatic.  Eyes:     Pupils: Pupils are equal, round, and reactive to light.  Neck:     Thyroid: No thyromegaly.  Cardiovascular:     Rate and Rhythm: Normal rate and regular rhythm.     Heart sounds: No murmur.  Pulmonary:     Effort: Pulmonary effort is normal. No respiratory distress.      Breath sounds: Normal breath sounds. No wheezing or rales.  Chest:     Chest wall: No tenderness.  Musculoskeletal:        General: No tenderness.     Cervical back: Normal range of motion and neck supple.  Skin:    General: Skin is warm and dry.  Neurological:     Mental Status: He is oriented to person, place, and time.  Psychiatric:        Behavior: Behavior normal.        Thought Content: Thought content normal.  Judgment: Judgment normal.    BP 110/82 (BP Location: Left Arm, Patient Position: Sitting, Cuff Size: Normal)   Pulse 63   Temp (!) 97.5 F (36.4 C) (Temporal)   Resp 18   Ht 6\' 2"  (1.88 m)   Wt 252 lb 9.6 oz (114.6 kg)   SpO2 96%   BMI 32.43 kg/m  Wt Readings from Last 3 Encounters:  11/29/19 252 lb 9.6 oz (114.6 kg)  07/04/19 262 lb 9.6 oz (119.1 kg)  05/29/19 263 lb 9.6 oz (119.6 kg)     Lab Results  Component Value Date   WBC 6.6 08/06/2018   HGB 12.0 (L) 08/06/2018   HCT 37.2 (L) 08/06/2018   PLT 174 08/06/2018   GLUCOSE 104 (H) 11/29/2019   CHOL 106 11/29/2019   TRIG 70.0 11/29/2019   HDL 51.70 11/29/2019   LDLCALC 40 11/29/2019   ALT 37 11/29/2019   AST 25 11/29/2019   NA 140 11/29/2019   K 4.0 11/29/2019   CL 105 11/29/2019   CREATININE 0.69 11/29/2019   BUN 12 11/29/2019   CO2 27 11/29/2019   TSH 0.564 08/05/2018   PSA 1.09 05/29/2019   INR 0.97 05/17/2011   HGBA1C 5.8 09/17/2014   MICROALBUR 1.5 11/20/2015    MR BRAIN W WO CONTRAST  Result Date: 06/14/2019 CLINICAL DATA:  Benign neoplasm of the pituitary gland. Follow-up pituitary adenoma. EXAM: MRI HEAD WITHOUT AND WITH CONTRAST TECHNIQUE: Multiplanar, multiecho pulse sequences of the brain and surrounding structures were obtained without and with intravenous contrast. CONTRAST:  78mL MULTIHANCE GADOBENATE DIMEGLUMINE 529 MG/ML IV SOLN COMPARISON:  08/04/2018.  06/19/2016. FINDINGS: Brain: Brain itself continues to have a normal appearance without evidence accelerated  atrophy, old or acute infarction, intra-axial mass lesion, hemorrhage, hydrocephalus or extra-axial collection. After contrast administration, no abnormal brain enhancement occurs. Pituitary micro adenoma to the right of midline measures 7 mm today, not significantly changed. This is best appreciable on the dynamic imaging, but coming less apparent on the delayed imaging. Vascular: Major vessels at the base of the brain show flow. Skull and upper cervical spine: Negative Sinuses/Orbits: Clear/normal Other: None IMPRESSION: Continued normal appearance of the brain itself. Very similar appearance of a pituitary microadenoma just to the right of midline, measuring 7 mm today compared with 8 mm previously. Electronically Signed   By: Nelson Chimes M.D.   On: 06/14/2019 22:01     Assessment & Plan:  Plan  I am having Michael Pearson maintain his aspirin, cetirizine, omeprazole, mometasone, albuterol, Multiple Vitamins-Minerals (MENS ONE DAILY PO), acetaminophen, HYDROcodone-acetaminophen, docusate sodium, enoxaparin, methocarbamol, valACYclovir, predniSONE, traMADol, triamcinolone cream, losartan, bromocriptine, metoprolol tartrate, ezetimibe, and atorvastatin.  No orders of the defined types were placed in this encounter.   Problem List Items Addressed This Visit      Unprioritized   Essential hypertension    Well controlled, no changes to meds. Encouraged heart healthy diet such as the DASH diet and exercise as tolerated.       Relevant Orders   Lipid panel (Completed)   Comprehensive metabolic panel (Completed)   Hyperlipidemia    Tolerating statin, encouraged heart healthy diet, avoid trans fats, minimize simple carbs and saturated fats. Increase exercise as tolerated      Relevant Orders   Lipid panel (Completed)   Comprehensive metabolic panel (Completed)   Non-ST elevation myocardial infarction (NSTEMI)    Per cardiology con't meds       Other Visit Diagnoses    Pituitary microadenoma  with  hyperprolactinemia ([place])    -  Primary   Relevant Orders   Prolactin      Follow-up: Return in about 6 months (around 05/28/2020), or if symptoms worsen or fail to improve.  [name], DO     +

## 2019-11-29 NOTE — Assessment & Plan Note (Signed)
Tolerating statin, encouraged heart healthy diet, avoid trans fats, minimize simple carbs and saturated fats. Increase exercise as tolerated 

## 2019-11-29 NOTE — Patient Instructions (Signed)
DASH Eating Plan DASH stands for "Dietary Approaches to Stop Hypertension." The DASH eating plan is a healthy eating plan that has been shown to reduce high blood pressure (hypertension). It may also reduce your risk for type 2 diabetes, heart disease, and stroke. The DASH eating plan may also help with weight loss. What are tips for following this plan?  General guidelines  Avoid eating more than 2,300 mg (milligrams) of salt (sodium) a day. If you have hypertension, you may need to reduce your sodium intake to 1,500 mg a day.  Limit alcohol intake to no more than 1 drink a day for nonpregnant women and 2 drinks a day for men. One drink equals 12 oz of beer, 5 oz of wine, or 1 oz of hard liquor.  Work with your health care provider to maintain a healthy body weight or to lose weight. Ask what an ideal weight is for you.  Get at least 30 minutes of exercise that causes your heart to beat faster (aerobic exercise) most days of the week. Activities may include walking, swimming, or biking.  Work with your health care provider or diet and nutrition specialist (dietitian) to adjust your eating plan to your individual calorie needs. Reading food labels   Check food labels for the amount of sodium per serving. Choose foods with less than 5 percent of the Daily Value of sodium. Generally, foods with less than 300 mg of sodium per serving fit into this eating plan.  To find whole grains, look for the word "whole" as the first word in the ingredient list. Shopping  Buy products labeled as "low-sodium" or "no salt added."  Buy fresh foods. Avoid canned foods and premade or frozen meals. Cooking  Avoid adding salt when cooking. Use salt-free seasonings or herbs instead of table salt or sea salt. Check with your health care provider or pharmacist before using salt substitutes.  Do not fry foods. Cook foods using healthy methods such as baking, boiling, grilling, and broiling instead.  Cook with  heart-healthy oils, such as olive, canola, soybean, or sunflower oil. Meal planning  Eat a balanced diet that includes: ? 5 or more servings of fruits and vegetables each day. At each meal, try to fill half of your plate with fruits and vegetables. ? Up to 6-8 servings of whole grains each day. ? Less than 6 oz of lean meat, poultry, or fish each day. A 3-oz serving of meat is about the same size as a deck of cards. One egg equals 1 oz. ? 2 servings of low-fat dairy each day. ? A serving of nuts, seeds, or beans 5 times each week. ? Heart-healthy fats. Healthy fats called Omega-3 fatty acids are found in foods such as flaxseeds and coldwater fish, like sardines, salmon, and mackerel.  Limit how much you eat of the following: ? Canned or prepackaged foods. ? Food that is high in trans fat, such as fried foods. ? Food that is high in saturated fat, such as fatty meat. ? Sweets, desserts, sugary drinks, and other foods with added sugar. ? Full-fat dairy products.  Do not salt foods before eating.  Try to eat at least 2 vegetarian meals each week.  Eat more home-cooked food and less restaurant, buffet, and fast food.  When eating at a restaurant, ask that your food be prepared with less salt or no salt, if possible. What foods are recommended? The items listed may not be a complete list. Talk with your dietitian about   what dietary choices are best for you. Grains Whole-grain or whole-wheat bread. Whole-grain or whole-wheat pasta. Brown rice. Oatmeal. Quinoa. Bulgur. Whole-grain and low-sodium cereals. Pita bread. Low-fat, low-sodium crackers. Whole-wheat flour tortillas. Vegetables Fresh or frozen vegetables (raw, steamed, roasted, or grilled). Low-sodium or reduced-sodium tomato and vegetable juice. Low-sodium or reduced-sodium tomato sauce and tomato paste. Low-sodium or reduced-sodium canned vegetables. Fruits All fresh, dried, or frozen fruit. Canned fruit in natural juice (without  added sugar). Meat and other protein foods Skinless chicken or turkey. Ground chicken or turkey. Pork with fat trimmed off. Fish and seafood. Egg whites. Dried beans, peas, or lentils. Unsalted nuts, nut butters, and seeds. Unsalted canned beans. Lean cuts of beef with fat trimmed off. Low-sodium, lean deli meat. Dairy Low-fat (1%) or fat-free (skim) milk. Fat-free, low-fat, or reduced-fat cheeses. Nonfat, low-sodium ricotta or cottage cheese. Low-fat or nonfat yogurt. Low-fat, low-sodium cheese. Fats and oils Soft margarine without trans fats. Vegetable oil. Low-fat, reduced-fat, or light mayonnaise and salad dressings (reduced-sodium). Canola, safflower, olive, soybean, and sunflower oils. Avocado. Seasoning and other foods Herbs. Spices. Seasoning mixes without salt. Unsalted popcorn and pretzels. Fat-free sweets. What foods are not recommended? The items listed may not be a complete list. Talk with your dietitian about what dietary choices are best for you. Grains Baked goods made with fat, such as croissants, muffins, or some breads. Dry pasta or rice meal packs. Vegetables Creamed or fried vegetables. Vegetables in a cheese sauce. Regular canned vegetables (not low-sodium or reduced-sodium). Regular canned tomato sauce and paste (not low-sodium or reduced-sodium). Regular tomato and vegetable juice (not low-sodium or reduced-sodium). Pickles. Olives. Fruits Canned fruit in a light or heavy syrup. Fried fruit. Fruit in cream or butter sauce. Meat and other protein foods Fatty cuts of meat. Ribs. Fried meat. Bacon. Sausage. Bologna and other processed lunch meats. Salami. Fatback. Hotdogs. Bratwurst. Salted nuts and seeds. Canned beans with added salt. Canned or smoked fish. Whole eggs or egg yolks. Chicken or turkey with skin. Dairy Whole or 2% milk, cream, and half-and-half. Whole or full-fat cream cheese. Whole-fat or sweetened yogurt. Full-fat cheese. Nondairy creamers. Whipped toppings.  Processed cheese and cheese spreads. Fats and oils Butter. Stick margarine. Lard. Shortening. Ghee. Bacon fat. Tropical oils, such as coconut, palm kernel, or palm oil. Seasoning and other foods Salted popcorn and pretzels. Onion salt, garlic salt, seasoned salt, table salt, and sea salt. Worcestershire sauce. Tartar sauce. Barbecue sauce. Teriyaki sauce. Soy sauce, including reduced-sodium. Steak sauce. Canned and packaged gravies. Fish sauce. Oyster sauce. Cocktail sauce. Horseradish that you find on the shelf. Ketchup. Mustard. Meat flavorings and tenderizers. Bouillon cubes. Hot sauce and Tabasco sauce. Premade or packaged marinades. Premade or packaged taco seasonings. Relishes. Regular salad dressings. Where to find more information:  National Heart, Lung, and Blood Institute: www.nhlbi.nih.gov  American Heart Association: www.heart.org Summary  The DASH eating plan is a healthy eating plan that has been shown to reduce high blood pressure (hypertension). It may also reduce your risk for type 2 diabetes, heart disease, and stroke.  With the DASH eating plan, you should limit salt (sodium) intake to 2,300 mg a day. If you have hypertension, you may need to reduce your sodium intake to 1,500 mg a day.  When on the DASH eating plan, aim to eat more fresh fruits and vegetables, whole grains, lean proteins, low-fat dairy, and heart-healthy fats.  Work with your health care provider or diet and nutrition specialist (dietitian) to adjust your eating plan to your   individual calorie needs. This information is not intended to replace advice given to you by your health care provider. Make sure you discuss any questions you have with your health care provider. Document Revised: 09/30/2017 Document Reviewed: 10/11/2016 Elsevier Patient Education  2020 Elsevier Inc.  

## 2019-11-30 LAB — PROLACTIN: Prolactin: 5.7 ng/mL (ref 2.0–18.0)

## 2019-12-10 ENCOUNTER — Telehealth: Payer: Self-pay

## 2019-12-10 ENCOUNTER — Encounter: Payer: Self-pay | Admitting: Family Medicine

## 2019-12-10 NOTE — Telephone Encounter (Signed)
Pt is a requesting a letter from PCP for Social Security stating he is of sound mind to manage finances.  Pt states the person that is handling his money is taking it and not giving him an explanation of how it is being used.

## 2019-12-10 NOTE — Telephone Encounter (Signed)
Please advise 

## 2019-12-10 NOTE — Telephone Encounter (Signed)
Letter in mychart

## 2020-01-02 ENCOUNTER — Ambulatory Visit (INDEPENDENT_AMBULATORY_CARE_PROVIDER_SITE_OTHER): Payer: Medicare Other | Admitting: Endocrinology

## 2020-01-02 ENCOUNTER — Encounter: Payer: Self-pay | Admitting: Endocrinology

## 2020-01-02 ENCOUNTER — Other Ambulatory Visit: Payer: Self-pay

## 2020-01-02 VITALS — BP 110/60 | HR 63 | Ht 74.0 in | Wt 250.0 lb

## 2020-01-02 DIAGNOSIS — E221 Hyperprolactinemia: Secondary | ICD-10-CM

## 2020-01-02 DIAGNOSIS — D352 Benign neoplasm of pituitary gland: Secondary | ICD-10-CM

## 2020-01-02 DIAGNOSIS — I2510 Atherosclerotic heart disease of native coronary artery without angina pectoris: Secondary | ICD-10-CM

## 2020-01-02 LAB — T4, FREE: Free T4: 0.81 ng/dL (ref 0.60–1.60)

## 2020-01-02 LAB — TSH: TSH: 1.82 u[IU]/mL (ref 0.35–4.50)

## 2020-01-02 NOTE — Patient Instructions (Signed)
Blood tests are requested for you today.  We'll let you know about the results.  Please come back for a follow-up appointment in 6 months.   

## 2020-01-02 NOTE — Progress Notes (Signed)
Subjective:    Patient ID: Michael Pearson, male    DOB: Jun 18, 1961, 59 y.o.   MRN: [id number]  HPI Pt returns for f/u of pituitary microprolactinoma (in 2005, pt was incidentally noted to have a pituitary adenoma; in 2017, elev. prolactin was noted; f/u MRI in 2020 showed adenoma was down to 7 mm; h/o near-syncope limits bromocriptine dosage; other pituitary functions are normal; f/u MRI in 2020 was unchanged).  Denies nausea and visual loss.  Past Medical History:  Diagnosis Date  . Allergy   . Arthritis   . Asthma   . CAD (coronary artery disease)    NSTEMI 7/12:  Cardiac cath on 7/17: pLAD occluded, Dx 30-40%, pRCA 30-40%, mRCA 30-40%.  Proximal LAD was treated with a BMS.  Echo 7/19:  EF 60-65%, mild LVH.    ETT-Myoview 6/14:  Inf thinning, no ischemia, EF 64%, normal study  . Cognitive communication deficit   . Colitis   . Diverticulosis   . Duodenitis    May 2012 with heme positive stools at this time. Endorses only rare streaking of blood now.  . External hemorrhoids   . GERD (gastroesophageal reflux disease)   . Heart murmur    as a child per the pt  . Hiatal hernia   . Hx of hemorrhoids   . Hyperlipidemia   . Hypertension   . Kidney stones   . Microadenoma    MRI in 2008 demonstrating 4.6 mm area of pituitary gland   . Migraines    Has been evaluated multiple times in past for chronic headache in which pt has had occasional nose bleeds and bloodshot eyes. This lasted for 4-5 months. CT of the head was negative in May 2012.  . Myocardial infarction ([place]) 05/17/2011  . Pituitary tumor   . RBBB (right bundle branch block)    Noted on EKG in 2008  . Ulcer     Past Surgical History:  Procedure Laterality Date  . COLONOSCOPY    . egd  03/05/2011   Dr. Benson [place]  . FLEXIBLE SIGMOIDOSCOPY  03/05/2011   Dr. Benson [place]  . HAMMER TOE SURGERY    . Heart stent    . HERNIA REPAIR     In 20's  . ORIF TIBIA PLATEAU Left 08/04/2018   Procedure: OPEN REDUCTION INTERNAL FIXATION (ORIF) TIBIAL  PLATEAU;  Surgeon: Altamese , MD;  Location: [facility];  Service: Orthopedics;  Laterality: Left;  . SIGMOIDOSCOPY    . TOOTH EXTRACTION    . UPPER GASTROINTESTINAL ENDOSCOPY      Social History   Socioeconomic History  . Marital status: Single    Spouse name: Not on file  . Number of children: Not on file  . Years of education: Not on file  . Highest education level: Not on file  Occupational History  . Occupation: Personnel officer-- cleans 3 buildings    Comment: Fairly physical job    Employer: [employer]  Tobacco Use  . Smoking status: Never Smoker  . Smokeless tobacco: Never Used  Substance and Sexual Activity  . Alcohol use: No  . Drug use: No  . Sexual activity: Not Currently  Other Topics Concern  . Not on file  Social History Narrative   Lives with brother and mother   Mother is quite sick, he and his brother take turns taking care of her   No children   Social Determinants of Health   Financial Resource Strain:   . Difficulty of Paying Living Expenses:  Not on file  Food Insecurity:   . Worried About Charity fundraiser in the Last Year: Not on file  . Ran Out of Food in the Last Year: Not on file  Transportation Needs:   . Lack of Transportation (Medical): Not on file  . Lack of Transportation (Non-Medical): Not on file  Physical Activity:   . Days of Exercise per Week: Not on file  . Minutes of Exercise per Session: Not on file  Stress:   . Feeling of Stress : Not on file  Social Connections:   . Frequency of Communication with Friends and Family: Not on file  . Frequency of Social Gatherings with Friends and Family: Not on file  . Attends Religious Services: Not on file  . Active Member of Clubs or Organizations: Not on file  . Attends Archivist Meetings: Not on file  . Marital Status: Not on file  Intimate Partner Violence:   . Fear of Current or Ex-Partner: Not on file  . Emotionally Abused: Not on file  . Physically Abused: Not  on file  . Sexually Abused: Not on file    Current Outpatient Medications on File Prior to Visit  Medication Sig Dispense Refill  . acetaminophen (TYLENOL) 500 MG tablet Take 1 tablet (500 mg total) by mouth every 12 (twelve) hours. 60 tablet 0  . albuterol (PROVENTIL HFA;VENTOLIN HFA) 108 (90 Base) MCG/ACT inhaler Inhale 2 puffs into the lungs every 4 (four) hours as needed for wheezing or shortness of breath. 1 Inhaler 2  . aspirin 81 MG tablet Take 81 mg by mouth daily.      Marland Kitchen atorvastatin (LIPITOR) 40 MG tablet TAKE ONE TABLET BY MOUTH ONE TIME DAILY  90 tablet 0  . bromocriptine (PARLODEL) 2.5 MG tablet TAKE ONE TABLET BY MOUTH TWICE DAILY  180 tablet 0  . cetirizine (ZYRTEC) 10 MG tablet Take 10 mg by mouth daily as needed for allergies.     Marland Kitchen docusate sodium (COLACE) 100 MG capsule Take 1 capsule (100 mg total) by mouth 2 (two) times daily. 20 capsule 0  . enoxaparin (LOVENOX) 40 MG/0.4ML injection Inject 0.4 mLs (40 mg total) into the skin daily. 30 Syringe 0  . ezetimibe (ZETIA) 10 MG tablet TAKE ONE TABLET BY MOUTH ONE TIME DAILY  90 tablet 0  . HYDROcodone-acetaminophen (NORCO) 7.5-325 MG tablet Take 1-2 tablets by mouth every 6 (six) hours as needed for moderate pain or severe pain. 50 tablet 0  . losartan (COZAAR) 50 MG tablet TAKE ONE TABLET BY MOUTH ONE TIME DAILY  90 tablet 0  . methocarbamol (ROBAXIN) 500 MG tablet Take 1 tablet (500 mg total) by mouth every 8 (eight) hours as needed for muscle spasms. 60 tablet 0  . metoprolol tartrate (LOPRESSOR) 25 MG tablet TAKE HALF TABLET BY MOUTH TWICE DAILY  90 tablet 0  . mometasone (NASONEX) 50 MCG/ACT nasal spray Place 2 sprays into the nose daily. (Patient taking differently: Place 2 sprays into the nose daily as needed (congestion). ) 17 g 2  . Multiple Vitamins-Minerals (MENS ONE DAILY PO) Take 1 tablet by mouth daily.    Marland Kitchen omeprazole (PRILOSEC) 40 MG capsule Take 1 capsule (40 mg total) by mouth daily. 30 minutes before breakfast  (Patient taking differently: Take 40 mg by mouth at bedtime. 30 minutes before breakfast) 30 capsule 3  . predniSONE (DELTASONE) 10 MG tablet TAKE 3 TABLETS PO QD FOR 3 DAYS THEN TAKE 2 TABLETS PO QD FOR  3 DAYS THEN TAKE 1 TABLET PO QD FOR 3 DAYS THEN TAKE 1/2 TAB PO QD FOR 3 DAYS 20 tablet 0  . traMADol (ULTRAM) 50 MG tablet Take 1 tablet (50 mg total) by mouth every 6 (six) hours as needed for severe pain. 30 tablet 0  . triamcinolone cream (KENALOG) 0.1 % Apply 1 application topically 2 (two) times daily. 30 g 2  . valACYclovir (VALTREX) 1000 MG tablet Take 1 tablet (1,000 mg total) by mouth 3 (three) times daily. 30 tablet 0   No current facility-administered medications on file prior to visit.    No Known Allergies  Family History  Problem Relation Age of Onset  . Stroke Mother   . Heart attack Father 38       MI  . Skin cancer Father   . Cancer Father        ? type "rare"  . Obesity Brother   . Coronary artery disease Brother        Possible, pt not sure of specifics  . Hypertension Brother   . Colon polyps Maternal Aunt   . Colon cancer Maternal Aunt   . Colon polyps Paternal Grandmother   . Other Neg Hx   . Esophageal cancer Neg Hx   . Rectal cancer Neg Hx   . Stomach cancer Neg Hx     BP 110/60 (BP Location: Left Arm, Patient Position: Sitting, Cuff Size: Large)   Pulse 63   Ht 6\' 2"  (1.88 m)   Wt 250 lb (113.4 kg)   SpO2 97%   BMI 32.10 kg/m    Review of Systems Denies headache.      Objective:   Physical Exam VITAL SIGNS:  See vs page GENERAL: no distress NECK: There is no palpable thyroid enlargement.  No thyroid nodule is palpable.  No palpable lymphadenopathy at the anterior neck.   EXT: trace bilat leg edema.         Assessment & Plan:  Hyperprolactinemia: recheck today Pituitary adenoma: due fore recheck  Patient Instructions  Blood tests are requested for you today.  We'll let you know about the results.  Please come back for a follow-up  appointment in 6 months.

## 2020-01-03 LAB — PROLACTIN: Prolactin: 9.3 ng/mL (ref 4.0–15.2)

## 2020-01-16 ENCOUNTER — Other Ambulatory Visit: Payer: Self-pay | Admitting: Endocrinology

## 2020-02-05 ENCOUNTER — Other Ambulatory Visit: Payer: Self-pay | Admitting: Family Medicine

## 2020-02-05 DIAGNOSIS — E785 Hyperlipidemia, unspecified: Secondary | ICD-10-CM

## 2020-02-05 DIAGNOSIS — I10 Essential (primary) hypertension: Secondary | ICD-10-CM

## 2020-02-05 DIAGNOSIS — I2510 Atherosclerotic heart disease of native coronary artery without angina pectoris: Secondary | ICD-10-CM

## 2020-03-03 ENCOUNTER — Other Ambulatory Visit: Payer: Self-pay

## 2020-03-03 ENCOUNTER — Ambulatory Visit (HOSPITAL_BASED_OUTPATIENT_CLINIC_OR_DEPARTMENT_OTHER)
Admission: RE | Admit: 2020-03-03 | Discharge: 2020-03-03 | Disposition: A | Discharge status: Home / Self Care | Payer: Medicare Other | Source: Ambulatory Visit | Attending: Family Medicine | Admitting: Family Medicine

## 2020-03-03 ENCOUNTER — Encounter: Payer: Self-pay | Admitting: Family Medicine

## 2020-03-03 ENCOUNTER — Ambulatory Visit (INDEPENDENT_AMBULATORY_CARE_PROVIDER_SITE_OTHER): Payer: Medicare Other | Admitting: Family Medicine

## 2020-03-03 VITALS — BP 120/64 | HR 70 | Temp 95.1°F | Ht 74.0 in | Wt 248.5 lb

## 2020-03-03 DIAGNOSIS — S92341A Displaced fracture of fourth metatarsal bone, right foot, initial encounter for closed fracture: Secondary | ICD-10-CM | POA: Diagnosis not present

## 2020-03-03 DIAGNOSIS — M79671 Pain in right foot: Secondary | ICD-10-CM | POA: Diagnosis not present

## 2020-03-03 LAB — COMPREHENSIVE METABOLIC PANEL
ALT: 35 U/L (ref 0–53)
AST: 24 U/L (ref 0–37)
Albumin: 4.3 g/dL (ref 3.5–5.2)
Alkaline Phosphatase: 58 U/L (ref 39–117)
BUN: 16 mg/dL (ref 6–23)
CO2: 30 mEq/L (ref 19–32)
Calcium: 9.3 mg/dL (ref 8.4–10.5)
Chloride: 104 mEq/L (ref 96–112)
Creatinine, Ser: 0.8 mg/dL (ref 0.40–1.50)
GFR: 98.88 mL/min (ref >60.00)
Glucose, Bld: 94 mg/dL (ref 70–99)
Potassium: 4.2 mEq/L (ref 3.5–5.1)
Sodium: 139 mEq/L (ref 135–145)
Total Bilirubin: 0.7 mg/dL (ref 0.2–1.2)
Total Protein: 6.4 g/dL (ref 6.0–8.3)

## 2020-03-03 LAB — CBC
HCT: 45 % (ref 39.0–52.0)
Hemoglobin: 15 g/dL (ref 13.0–17.0)
MCHC: 33.3 g/dL (ref 30.0–36.0)
MCV: 97.3 fl (ref 78.0–100.0)
Platelets: 171 10*3/uL (ref 150.0–400.0)
RBC: 4.62 Mil/uL (ref 4.22–5.81)
RDW: 13.8 % (ref 11.5–15.5)
WBC: 6.1 10*3/uL (ref 4.0–10.5)

## 2020-03-03 LAB — SEDIMENTATION RATE: Sed Rate: 5 mm/hr (ref 0–20)

## 2020-03-03 MED ORDER — TRAMADOL HCL 50 MG PO TABS: 50.0000 mg | ORAL_TABLET | Freq: Three times a day (TID) | ORAL | 0 refills | Status: AC | PRN

## 2020-03-03 NOTE — Patient Instructions (Signed)
Ice/cold pack over area for 10-15 min twice daily.  OK to take Tylenol 1000 mg (2 extra strength tabs) or 975 mg (3 regular strength tabs) every 6 hours as needed.  Give [place] 2-3 business days to get the results of your labs back.   Ibuprofen 400-600 mg (2-3 over the counter strength tabs) every 6 hours as needed for pain.  If you do not hear anything about your referral in the next 1-2 days, call our office and ask for an update.   Things to look out for: increasing pain not relieved by ibuprofen/acetaminophen, fevers, spreading redness, drainage of pus, or foul odor.  Let us know if you need anything.

## 2020-03-03 NOTE — Progress Notes (Signed)
Musculoskeletal Exam  Patient: Michael Pearson DOB: June 04, 1961  DOS: 03/03/2020  SUBJECTIVE:  Chief Complaint:   Chief Complaint  Patient presents with  . Foot Pain    right foot second toe    Michael Pearson is a 59 y.o.  male for evaluation and treatment of R foot pain.   Onset:  1 month ago. No inj or change in activity.  Location: 1st and 2nd digit Character:  aching  Progression of issue:  has worsened Associated symptoms: bruising, swelling; no redness, fevers Treatment: to date has been rest.   Neurovascular symptoms: no +hx of hammer toes  Past Medical History:  Diagnosis Date  . Allergy   . Arthritis   . Asthma   . CAD (coronary artery disease)    NSTEMI 7/12:  Cardiac cath on 7/17: pLAD occluded, Dx 30-40%, pRCA 30-40%, mRCA 30-40%.  Proximal LAD was treated with a BMS.  Echo 7/19:  EF 60-65%, mild LVH.    ETT-Myoview 6/14:  Inf thinning, no ischemia, EF 64%, normal study  . Cognitive communication deficit   . Colitis   . Diverticulosis   . Duodenitis    May 2012 with heme positive stools at this time. Endorses only rare streaking of blood now.  . External hemorrhoids   . GERD (gastroesophageal reflux disease)   . Heart murmur    as a child per the pt  . Hiatal hernia   . Hx of hemorrhoids   . Hyperlipidemia   . Hypertension   . Kidney stones   . Microadenoma    MRI in 2008 demonstrating 4.6 mm area of pituitary gland   . Migraines    Has been evaluated multiple times in past for chronic headache in which pt has had occasional nose bleeds and bloodshot eyes. This lasted for 4-5 months. CT of the head was negative in May 2012.  . Myocardial infarction ([place]) 05/17/2011  . Pituitary tumor   . RBBB (right bundle branch block)    Noted on EKG in 2008  . Ulcer     Objective: VITAL SIGNS: BP 120/64 (BP Location: Left Arm, Patient Position: Sitting, Cuff Size: Large)   Pulse 70   Temp (!) 95.1 F (35.1 C) (Temporal)   Ht 6\' 2"  (1.88 m)   Wt 248 lb 8 oz (112.7  kg)   SpO2 98%   BMI 31.91 kg/m  Constitutional: Well formed, well developed. No acute distress. Cardiovascular: Brisk cap refill Thorax & Lungs: No accessory muscle use Musculoskeletal: R foot.   Tenderness to palpation: yes over 1st and 2nd digit; pain w rotation/movement of above toes as well (at MTP's) Deformity: no Ecchymosis: yes +edema of 2nd digit There is no erythema, crepitus or excessive warmth Neurologic: Normal sensory function.  Psychiatric: Normal mood. Age appropriate judgment and insight. Alert & oriented x 3.          Assessment:  Right foot pain - Plan: DG Foot Complete Right, CBC, Comprehensive metabolic panel, traMADol (ULTRAM) 50 MG tablet, Sedimentation rate, Ambulatory referral to Podiatry  Plan: Doubt osteo, no fx/dislocation pending official read. Ck labs. Urgent referral to podiatry. No hx of trauma is odd. Warning signs and symptoms verbalized and written down in AVS.  F/u prn. The patient voiced understanding and agreement to the plan.   [place], DO 03/03/20  11:26 AM

## 2020-03-05 ENCOUNTER — Ambulatory Visit (INDEPENDENT_AMBULATORY_CARE_PROVIDER_SITE_OTHER): Payer: Medicare Other

## 2020-03-05 ENCOUNTER — Other Ambulatory Visit: Payer: Self-pay

## 2020-03-05 ENCOUNTER — Encounter: Payer: Self-pay | Admitting: Podiatry

## 2020-03-05 ENCOUNTER — Ambulatory Visit (INDEPENDENT_AMBULATORY_CARE_PROVIDER_SITE_OTHER): Payer: Medicare Other | Admitting: Podiatry

## 2020-03-05 VITALS — Temp 97.3°F

## 2020-03-05 DIAGNOSIS — I2510 Atherosclerotic heart disease of native coronary artery without angina pectoris: Secondary | ICD-10-CM

## 2020-03-05 DIAGNOSIS — L84 Corns and callosities: Secondary | ICD-10-CM | POA: Diagnosis not present

## 2020-03-05 DIAGNOSIS — S9031XA Contusion of right foot, initial encounter: Secondary | ICD-10-CM

## 2020-03-05 DIAGNOSIS — D689 Coagulation defect, unspecified: Secondary | ICD-10-CM

## 2020-03-05 NOTE — Progress Notes (Signed)
Subjective:   Patient ID: Michael Pearson, male   DOB: 59 y.o.   MRN: [id number]   HPI Patient presents stating that he was concerned about discoloration of his right big toenail a very painful corn at the end of his right big toe and a black nail second with thickness.  Does not remember specific trauma but it is possible and patient is in relatively poor health and is on blood thinner.  Patient does not smoke likes to be active   Review of Systems  All other systems reviewed and are negative.       Objective:  Physical Exam Vitals and nursing note reviewed.  Constitutional:      Appearance: He is well-developed.  Pulmonary:     Effort: Pulmonary effort is normal.  Musculoskeletal:        General: Normal range of motion.  Skin:    General: Skin is warm.  Neurological:     Mental Status: He is alert.     Neurovascular status found to be diminished with patient found to have history of digital procedures right with elongated right hallux with distal keratotic lesion that is very painful.  Patient has discoloration of the right big toenail and thickness of the right second nail and does have to take Lovenox due to history of clotting.  Patient has good digital perfusion well oriented x3     Assessment:  Significant foot structural issues with distal painful keratotic lesion and probability for trauma right foot     Plan:  H&P x-rays reviewed and today sterile distal debridement accomplished of the right hallux along with padding therapy and I discussed soaks and patient will be seen back if symptoms persist and may ultimately require a hallux fusion  X-rays indicate that there is significant elongation of the right hallux with history of digital procedures digits 2 and 3 right

## 2020-03-25 NOTE — Progress Notes (Signed)
Cardiology Office Note:    Date:  03/26/2020   ID:  Michael Pearson, DOB August 21, 1961, MRN [id number]  PCP:  Michael Pearson, Michael Apa, DO  Cardiologist:  Michael Mocha, MD   Electrophysiologist:  None   Referring MD: Michael Pearson, Michael Pearson, *   Chief Complaint:  Follow-up (CAD)    Patient Profile:    Michael Pearson is a 59 y.o. male with:   Coronary artery disease   S/p NSTEMI in 2012 tx with BMS to LAD  Myoview in 2014 low risk   Myoview 2017: low risk   Asthma   Hypertension   Hyperlipidemia   RBBB  Pituitary Microprolactinoma   Prior CV studies: Echocardiogram 02/07/18 EF 55-60, no RWMA, mild LAE, mild RVE  Myoview 06/09/16 EF 61, diaph atten, normal perfusion; Low Risk   LHC 05/2011:  Proximal LAD occluded, ostial Dx 30-40%,  proximal RCA 30-40%, mid RCA 30-40% PCI: BMS to LAD   History of Present Illness:    [name] was last seen by Dr. Burt Pearson in 01/2018. He returns for follow-up. He fell and broke his left leg about a year ago. It sounds like he had to have fairly extensive surgery. He is somewhat bothered by wearing masks. He notes chest discomfort from time to time. It sounds as though his discomfort reminds him of his previous angina. He will have shortness of breath with this as well. He is able to exert himself without a mask and not have symptoms. He has not had syncope, orthopnea. He does have burning in his feet and sounds as though he may have neuropathy. He has some swelling associated with this.  Past Medical History:  Diagnosis Date  . Allergy   . Arthritis   . Asthma   . CAD (coronary artery disease)    NSTEMI 7/12:  Cardiac cath on 7/17: pLAD occluded, Dx 30-40%, pRCA 30-40%, mRCA 30-40%.  Proximal LAD was treated with a BMS.  Echo 7/19:  EF 60-65%, mild LVH.    ETT-Myoview 6/14:  Inf thinning, no ischemia, EF 64%, normal study  . Cognitive communication deficit   . Colitis   . Diverticulosis   . Duodenitis    May 2012 with heme positive stools at  this time. Endorses only rare streaking of blood now.  . External hemorrhoids   . GERD (gastroesophageal reflux disease)   . Heart murmur    as a child per the pt  . Hiatal hernia   . Hx of hemorrhoids   . Hyperlipidemia   . Hypertension   . Kidney stones   . Microadenoma    MRI in 2008 demonstrating 4.6 mm area of pituitary gland   . Migraines    Has been evaluated multiple times in past for chronic headache in which pt has had occasional nose bleeds and bloodshot eyes. This lasted for 4-5 months. CT of the head was negative in May 2012.  . Myocardial infarction ([place]) 05/17/2011  . Pituitary tumor   . RBBB (right bundle branch block)    Noted on EKG in 2008  . Ulcer     Current Medications: Current Meds  Medication Sig  . acetaminophen (TYLENOL) 500 MG tablet Take 1 tablet (500 mg total) by mouth every 12 (twelve) hours.  Marland Kitchen albuterol (PROVENTIL HFA;VENTOLIN HFA) 108 (90 Base) MCG/ACT inhaler Inhale 2 puffs into the lungs every 4 (four) hours as needed for wheezing or shortness of breath.  Marland Kitchen aspirin 81 MG tablet Take 81 mg by  mouth daily.    Marland Kitchen atorvastatin (LIPITOR) 40 MG tablet TAKE ONE TABLET BY MOUTH ONE TIME DAILY   . bromocriptine (PARLODEL) 2.5 MG tablet TAKE ONE TABLET BY MOUTH TWICE DAILY   . cetirizine (ZYRTEC) 10 MG tablet Take 10 mg by mouth daily as needed for allergies.   Marland Kitchen docusate sodium (COLACE) 100 MG capsule Take 1 capsule (100 mg total) by mouth 2 (two) times daily.  Marland Kitchen ezetimibe (ZETIA) 10 MG tablet TAKE ONE TABLET BY MOUTH ONE TIME DAILY   . HYDROcodone-acetaminophen (NORCO) 7.5-325 MG tablet Take 1-2 tablets by mouth every 6 (six) hours as needed for moderate pain or severe pain.  Marland Kitchen losartan (COZAAR) 50 MG tablet TAKE ONE TABLET BY MOUTH ONE TIME DAILY   . methocarbamol (ROBAXIN) 500 MG tablet Take 1 tablet (500 mg total) by mouth every 8 (eight) hours as needed for muscle spasms.  . metoprolol tartrate (LOPRESSOR) 25 MG tablet TAKE HALF TABLET BY MOUTH TWICE  DAILY   . mometasone (NASONEX) 50 MCG/ACT nasal spray Place 2 sprays into the nose daily.  . Multiple Vitamins-Minerals (MENS ONE DAILY PO) Take 1 tablet by mouth daily.  Marland Kitchen omeprazole (PRILOSEC) 40 MG capsule Take 1 capsule (40 mg total) by mouth daily. 30 minutes before breakfast  . triamcinolone cream (KENALOG) 0.1 % Apply 1 application topically 2 (two) times daily.  . valACYclovir (VALTREX) 1000 MG tablet Take 1 tablet (1,000 mg total) by mouth 3 (three) times daily.     Allergies:   Patient has no known allergies.   Social History   Tobacco Use  . Smoking status: Never Smoker  . Smokeless tobacco: Never Used  Substance Use Topics  . Alcohol use: No  . Drug use: No     Family Hx: The patient's family history includes Cancer in his father; Colon cancer in his maternal aunt; Colon polyps in his maternal aunt and paternal grandmother; Coronary artery disease in his brother; Heart attack (age of onset: 36) in his father; Hypertension in his brother; Obesity in his brother; Skin cancer in his father; Stroke in his mother. There is no history of Other, Esophageal cancer, Rectal cancer, or Stomach cancer.  ROS   EKGs/Labs/Other Test Reviewed:    EKG:  EKG is   ordered today.  The ekg ordered today demonstrates sinus bradycardia, heart rate 56, normal axis, right bundle branch block, QTC 422, no ST-T wave changes  Recent Labs: 01/02/2020: TSH 1.82 03/03/2020: ALT 35; BUN 16; Creatinine, Ser 0.80; Hemoglobin 15.0; Platelets 171.0; Potassium 4.2; Sodium 139   Recent Lipid Panel Lab Results  Component Value Date/Time   CHOL 106 11/29/2019 11:22 AM   TRIG 70.0 11/29/2019 11:22 AM   HDL 51.70 11/29/2019 11:22 AM   CHOLHDL 2 11/29/2019 11:22 AM   LDLCALC 40 11/29/2019 11:22 AM    Physical Exam:    VS:  BP 124/72   Pulse 62   Ht 6\' 2"  (1.88 m)   Wt 246 lb (111.6 kg)   SpO2 94%   BMI 31.58 kg/m     Wt Readings from Last 3 Encounters:  03/26/20 246 lb (111.6 kg)  03/03/20 248 lb  8 oz (112.7 kg)  01/02/20 250 lb (113.4 kg)     Constitutional:      Appearance: Healthy appearance. Not in distress.  Neck:     Thyroid: No thyromegaly.     Vascular: No carotid bruit. JVD normal.  Pulmonary:     Effort: Pulmonary effort is normal.  Breath sounds: No wheezing. No rales.  Cardiovascular:     Normal rate. Regular rhythm. Normal S1. Normal S2.     Murmurs: There is no murmur.  Edema:    Peripheral edema absent.  Abdominal:     Palpations: Abdomen is soft. There is no hepatomegaly.  Skin:    General: Skin is warm and dry.  Neurological:     General: No focal deficit present.     Mental Status: Alert and oriented to person, place and time.     Cranial Nerves: Cranial nerves are intact.       ASSESSMENT & PLAN:    1. Coronary artery disease involving native coronary artery of native heart with angina pectoris ([place]) History of non-STEMI in 2012 treated a bare-metal stent to the LAD. Nuclear stress test in 2017 was low risk. He does note symptoms of angina at times. This seems to be related to wearing a mask. In any event, it has been several years since his last assessment for ischemia. Therefore, I will arrange a Lexiscan Myoview to rule out significant ischemia. Continue aspirin, atorvastatin, ezetimibe, metoprolol tartrate, losartan. Follow-up in 1 year, or sooner if Myoview abnormal.  2. Essential hypertension The patient's blood pressure is controlled on his current regimen.  Continue current therapy.   3. Mixed hyperlipidemia LDL optimal on most recent lab work.  Continue current Rx.      Dispo:  Return in about 1 year (around 03/26/2021) for Routine Follow Up, w/ Dr. Burt Pearson, or Richardson Dopp, PA-C, in person.   Medication Adjustments/Labs and Tests Ordered: Current medicines are reviewed at length with the patient today.  Concerns regarding medicines are outlined above.  Tests Ordered: Orders Placed This Encounter  Procedures  . MYOCARDIAL PERFUSION  IMAGING  . EKG 12-Lead   Medication Changes: No orders of the defined types were placed in this encounter.   Signed, Richardson Dopp, PA-C  03/26/2020 11:15 AM    [facility], [place], [place]  [id number] Phone: [phone]; Fax: [phone]

## 2020-03-26 ENCOUNTER — Encounter (HOSPITAL_COMMUNITY): Payer: Self-pay | Admitting: *Deleted

## 2020-03-26 ENCOUNTER — Encounter: Payer: Self-pay | Admitting: Physician Assistant

## 2020-03-26 ENCOUNTER — Telehealth (HOSPITAL_COMMUNITY): Payer: Self-pay | Admitting: *Deleted

## 2020-03-26 ENCOUNTER — Other Ambulatory Visit: Payer: Self-pay

## 2020-03-26 ENCOUNTER — Ambulatory Visit (INDEPENDENT_AMBULATORY_CARE_PROVIDER_SITE_OTHER): Payer: Medicare Other | Admitting: Physician Assistant

## 2020-03-26 VITALS — BP 124/72 | HR 62 | Ht 74.0 in | Wt 246.0 lb

## 2020-03-26 DIAGNOSIS — I10 Essential (primary) hypertension: Secondary | ICD-10-CM | POA: Diagnosis not present

## 2020-03-26 DIAGNOSIS — E782 Mixed hyperlipidemia: Secondary | ICD-10-CM | POA: Diagnosis not present

## 2020-03-26 DIAGNOSIS — I25119 Atherosclerotic heart disease of native coronary artery with unspecified angina pectoris: Secondary | ICD-10-CM

## 2020-03-26 NOTE — Telephone Encounter (Signed)
Attempted to call patient- no answer- my chart letter sent with instructions for upcoming Nuclear stress test. Kirstie Peri

## 2020-03-26 NOTE — Patient Instructions (Signed)
Medication Instructions:   Your physician recommends that you continue on your current medications as directed. Please refer to the Current Medication list given to you today.  *If you need a refill on your cardiac medications before your next appointment, please call your pharmacy*  Lab Work:  None ordered today  Testing/Procedures:  Your physician has requested that you have a lexiscan myoview. For further information please visit [email or web address]. Please follow instruction sheet, as given.  Follow-Up: At [facility], you and your health needs are our priority.  As part of our continuing mission to provide you with exceptional heart care, we have created designated Provider Care Teams.  These Care Teams include your primary Cardiologist (physician) and Advanced Practice Providers (APPs -  Physician Assistants and Nurse Practitioners) who all work together to provide you with the care you need, when you need it.  We recommend signing up for the patient portal called "MyChart".  Sign up information is provided on this After Visit Summary.  MyChart is used to connect with patients for Virtual Visits (Telemedicine).  Patients are able to view lab/test results, encounter notes, upcoming appointments, etc.  Non-urgent messages can be sent to your provider as well.   To learn more about what you can do with MyChart, go to [email or web address].    Your next appointment:   12 month(s)  The format for your next appointment:   In Person  Provider:   You may see Sherren Mocha, MD or Richardson Dopp, PA-C

## 2020-04-02 ENCOUNTER — Ambulatory Visit (HOSPITAL_COMMUNITY): Payer: Medicare Other | Attending: Cardiovascular Disease

## 2020-04-02 ENCOUNTER — Encounter: Payer: Self-pay | Admitting: Physician Assistant

## 2020-04-02 ENCOUNTER — Other Ambulatory Visit: Payer: Self-pay

## 2020-04-02 DIAGNOSIS — I25119 Atherosclerotic heart disease of native coronary artery with unspecified angina pectoris: Secondary | ICD-10-CM | POA: Diagnosis not present

## 2020-04-02 LAB — MYOCARDIAL PERFUSION IMAGING
LV dias vol: 135 mL (ref 62–150)
LV sys vol: 58 mL
Peak HR: 80 {beats}/min
Rest HR: 57 {beats}/min
SDS: 2
SRS: 0
SSS: 2
TID: 1.04

## 2020-04-02 MED ORDER — REGADENOSON 0.4 MG/5ML IV SOLN
0.4000 mg | Freq: Once | INTRAVENOUS | Status: AC
  Administered 2020-04-02: 0.4 mg via INTRAVENOUS

## 2020-04-02 MED ORDER — TECHNETIUM TC 99M TETROFOSMIN IV KIT
31.9000 | PACK | Freq: Once | INTRAVENOUS | Status: AC | PRN
  Administered 2020-04-02: 31.9 via INTRAVENOUS
  Filled 2020-04-02: qty 32

## 2020-04-02 MED ORDER — TECHNETIUM TC 99M TETROFOSMIN IV KIT
11.0000 | PACK | Freq: Once | INTRAVENOUS | Status: AC | PRN
  Administered 2020-04-02: 11 via INTRAVENOUS
  Filled 2020-04-02: qty 11

## 2020-04-21 ENCOUNTER — Other Ambulatory Visit: Payer: Self-pay | Admitting: Endocrinology

## 2020-05-07 ENCOUNTER — Other Ambulatory Visit: Payer: Self-pay | Admitting: Family Medicine

## 2020-05-07 DIAGNOSIS — I10 Essential (primary) hypertension: Secondary | ICD-10-CM

## 2020-05-07 DIAGNOSIS — I2510 Atherosclerotic heart disease of native coronary artery without angina pectoris: Secondary | ICD-10-CM

## 2020-05-07 DIAGNOSIS — E785 Hyperlipidemia, unspecified: Secondary | ICD-10-CM

## 2020-05-16 ENCOUNTER — Encounter: Payer: Self-pay | Admitting: Family Medicine

## 2020-05-16 ENCOUNTER — Ambulatory Visit (INDEPENDENT_AMBULATORY_CARE_PROVIDER_SITE_OTHER): Payer: Medicare Other | Admitting: Family Medicine

## 2020-05-16 ENCOUNTER — Other Ambulatory Visit: Payer: Self-pay

## 2020-05-16 VITALS — BP 110/70 | HR 64 | Temp 98.0°F | Resp 18 | Ht 74.0 in | Wt 256.6 lb

## 2020-05-16 DIAGNOSIS — I10 Essential (primary) hypertension: Secondary | ICD-10-CM | POA: Diagnosis not present

## 2020-05-16 DIAGNOSIS — E785 Hyperlipidemia, unspecified: Secondary | ICD-10-CM | POA: Diagnosis not present

## 2020-05-16 DIAGNOSIS — E782 Mixed hyperlipidemia: Secondary | ICD-10-CM

## 2020-05-16 DIAGNOSIS — R41841 Cognitive communication deficit: Secondary | ICD-10-CM | POA: Insufficient documentation

## 2020-05-16 DIAGNOSIS — D352 Benign neoplasm of pituitary gland: Secondary | ICD-10-CM | POA: Diagnosis not present

## 2020-05-16 DIAGNOSIS — I25119 Atherosclerotic heart disease of native coronary artery with unspecified angina pectoris: Secondary | ICD-10-CM | POA: Diagnosis not present

## 2020-05-16 LAB — LIPID PANEL
Cholesterol: 92 mg/dL (ref 0–200)
HDL: 47.1 mg/dL (ref >39.00)
LDL Cholesterol: 36 mg/dL (ref 0–99)
NonHDL: 45.09
Total CHOL/HDL Ratio: 2
Triglycerides: 47 mg/dL (ref 0.0–149.0)
VLDL: 9.4 mg/dL (ref 0.0–40.0)

## 2020-05-16 LAB — COMPREHENSIVE METABOLIC PANEL
ALT: 37 U/L (ref 0–53)
AST: 24 U/L (ref 0–37)
Albumin: 4.4 g/dL (ref 3.5–5.2)
Alkaline Phosphatase: 54 U/L (ref 39–117)
BUN: 13 mg/dL (ref 6–23)
CO2: 28 mEq/L (ref 19–32)
Calcium: 9.4 mg/dL (ref 8.4–10.5)
Chloride: 104 mEq/L (ref 96–112)
Creatinine, Ser: 0.91 mg/dL (ref 0.40–1.50)
GFR: 85.16 mL/min (ref >60.00)
Glucose, Bld: 97 mg/dL (ref 70–99)
Potassium: 3.8 mEq/L (ref 3.5–5.1)
Sodium: 138 mEq/L (ref 135–145)
Total Bilirubin: 0.6 mg/dL (ref 0.2–1.2)
Total Protein: 6.6 g/dL (ref 6.0–8.3)

## 2020-05-16 NOTE — Assessment & Plan Note (Signed)
Per endo °

## 2020-05-16 NOTE — Progress Notes (Signed)
Patient ID: Michael Pearson, male    DOB: 1961/05/03  Age: 59 y.o. MRN: [id number]    Subjective:  Subjective  HPI [name] presents for f/u bp / chol and he may lose his disability due to him working at [employer]--- he states he did not know he could not work but actually is now not working due to cognitive disability and feet pain   No other complaints   Review of Systems  Constitutional: Negative.   HENT: Negative for congestion, ear pain, hearing loss, nosebleeds, postnasal drip, rhinorrhea, sinus pressure, sneezing and tinnitus.   Eyes: Negative for photophobia, discharge, itching and visual disturbance.  Respiratory: Negative.   Cardiovascular: Negative.   Gastrointestinal: Negative for abdominal distention, abdominal pain, anal bleeding, blood in stool and constipation.  Endocrine: Negative.   Genitourinary: Negative.   Musculoskeletal: Negative.   Skin: Negative.   Allergic/Immunologic: Negative.   Neurological: Negative for dizziness, weakness, light-headedness, numbness and headaches.  Psychiatric/Behavioral: Negative for agitation, confusion, decreased concentration, dysphoric mood, sleep disturbance and suicidal ideas. The patient is not nervous/anxious.     History Past Medical History:  Diagnosis Date  . Allergy   . Arthritis   . Asthma   . CAD (coronary artery disease)    NSTEMI 7/12:  Cardiac cath on 7/17: pLAD occluded, Dx 30-40%, pRCA 30-40%, mRCA 30-40%.  Proximal LAD was treated with a BMS.  Echo 7/19:  EF 60-65%, mild LVH.    ETT-Myoview 6/14:  Inf thinning, no ischemia, EF 64%, normal study // Myoview 04/2020: EF 57, normal perfusion; Low Risk    . Cognitive communication deficit   . Colitis   . Diverticulosis   . Duodenitis    May 2012 with heme positive stools at this time. Endorses only rare streaking of blood now.  . External hemorrhoids   . GERD (gastroesophageal reflux disease)   . Heart murmur    as a child per the pt  . Hiatal hernia   . Hx of  hemorrhoids   . Hyperlipidemia   . Hypertension   . Kidney stones   . Microadenoma    MRI in 2008 demonstrating 4.6 mm area of pituitary gland   . Migraines    Has been evaluated multiple times in past for chronic headache in which pt has had occasional nose bleeds and bloodshot eyes. This lasted for 4-5 months. CT of the head was negative in May 2012.  . Myocardial infarction ([place]) 05/17/2011  . Pituitary tumor   . RBBB (right bundle branch block)    Noted on EKG in 2008  . Ulcer     He has a past surgical history that includes Hernia repair; Tooth extraction; Hammer toe surgery; egd (03/05/2011); Flexible sigmoidoscopy (03/05/2011); Heart stent; Upper gastrointestinal endoscopy; Colonoscopy; Sigmoidoscopy; and ORIF tibia plateau (Left, 08/04/2018).   His family history includes Cancer in his father; Colon cancer in his maternal aunt; Colon polyps in his maternal aunt and paternal grandmother; Coronary artery disease in his brother; Heart attack (age of onset: 79) in his father; Hypertension in his brother; Obesity in his brother; Skin cancer in his father; Stroke in his mother.He reports that he has never smoked. He has never used smokeless tobacco. He reports that he does not drink alcohol and does not use drugs.  Current Outpatient Medications on File Prior to Visit  Medication Sig Dispense Refill  . acetaminophen (TYLENOL) 500 MG tablet Take 1 tablet (500 mg total) by mouth every 12 (twelve) hours. [place]  tablet 0  . albuterol (PROVENTIL HFA;VENTOLIN HFA) 108 (90 Base) MCG/ACT inhaler Inhale 2 puffs into the lungs every 4 (four) hours as needed for wheezing or shortness of breath. 1 Inhaler 2  . aspirin 81 MG tablet Take 81 mg by mouth daily.      Marland Kitchen atorvastatin (LIPITOR) 40 MG tablet Take 1 tablet (40 mg total) by mouth daily. 90 tablet 0  . bromocriptine (PARLODEL) 2.5 MG tablet TAKE ONE TABLET BY MOUTH TWICE DAILY 180 tablet 0  . cetirizine (ZYRTEC) 10 MG tablet Take 10 mg by mouth daily as  needed for allergies.     Marland Kitchen docusate sodium (COLACE) 100 MG capsule Take 1 capsule (100 mg total) by mouth 2 (two) times daily. 20 capsule 0  . ezetimibe (ZETIA) 10 MG tablet Take 1 tablet (10 mg total) by mouth daily. 90 tablet 0  . HYDROcodone-acetaminophen (NORCO) 7.5-325 MG tablet Take 1-2 tablets by mouth every 6 (six) hours as needed for moderate pain or severe pain. 50 tablet 0  . losartan (COZAAR) 50 MG tablet Take 1 tablet (50 mg total) by mouth daily. 90 tablet 0  . methocarbamol (ROBAXIN) 500 MG tablet Take 1 tablet (500 mg total) by mouth every 8 (eight) hours as needed for muscle spasms. 60 tablet 0  . metoprolol tartrate (LOPRESSOR) 25 MG tablet Take 0.5 tablets (12.5 mg total) by mouth 2 (two) times daily. 90 tablet 0  . mometasone (NASONEX) 50 MCG/ACT nasal spray Place 2 sprays into the nose daily. 17 g 2  . Multiple Vitamins-Minerals (MENS ONE DAILY PO) Take 1 tablet by mouth daily.    Marland Kitchen omeprazole (PRILOSEC) 40 MG capsule Take 1 capsule (40 mg total) by mouth daily. 30 minutes before breakfast 30 capsule 3  . triamcinolone cream (KENALOG) 0.1 % Apply 1 application topically 2 (two) times daily. 30 g 2  . valACYclovir (VALTREX) 1000 MG tablet Take 1 tablet (1,000 mg total) by mouth 3 (three) times daily. 30 tablet 0   No current facility-administered medications on file prior to visit.     Objective:  Objective  Physical Exam Vitals and nursing note reviewed.  Constitutional:      General: He is sleeping.     Appearance: He is well-developed.  HENT:     Head: Normocephalic and atraumatic.  Eyes:     Pupils: Pupils are equal, round, and reactive to light.  Neck:     Thyroid: No thyromegaly.  Cardiovascular:     Rate and Rhythm: Normal rate and regular rhythm.     Heart sounds: No murmur heard.   Pulmonary:     Effort: Pulmonary effort is normal. No respiratory distress.     Breath sounds: Normal breath sounds. No wheezing or rales.  Chest:     Chest wall: No  tenderness.  Musculoskeletal:        General: No tenderness.     Cervical back: Normal range of motion and neck supple.  Skin:    General: Skin is warm and dry.  Neurological:     Mental Status: He is oriented to person, place, and time.  Psychiatric:        Behavior: Behavior normal.        Thought Content: Thought content normal.        Judgment: Judgment normal.    BP 110/70 (BP Location: Right Arm, Patient Position: Sitting, Cuff Size: Normal)   Pulse 64   Temp 98 F (36.7 C) (Temporal)   Resp 18  Ht 6\' 2"  (1.88 m)   Wt 256 lb 9.6 oz (116.4 kg)   SpO2 92%   BMI 32.95 kg/m  Wt Readings from Last 3 Encounters:  05/16/20 256 lb 9.6 oz (116.4 kg)  04/02/20 246 lb (111.6 kg)  03/26/20 246 lb (111.6 kg)     Lab Results  Component Value Date   WBC 6.1 03/03/2020   HGB 15.0 03/03/2020   HCT 45.0 03/03/2020   PLT 171.0 03/03/2020   GLUCOSE 94 03/03/2020   CHOL 106 11/29/2019   TRIG 70.0 11/29/2019   HDL 51.70 11/29/2019   LDLCALC 40 11/29/2019   ALT 35 03/03/2020   AST 24 03/03/2020   NA 139 03/03/2020   K 4.2 03/03/2020   CL 104 03/03/2020   CREATININE 0.80 03/03/2020   BUN 16 03/03/2020   CO2 30 03/03/2020   TSH 1.82 01/02/2020   PSA 1.09 05/29/2019   INR 0.97 05/17/2011   HGBA1C 5.8 09/17/2014   MICROALBUR 1.5 11/20/2015    DG Foot Complete Right  Result Date: 03/03/2020 CLINICAL DATA:  Pain. Edema. EXAM: RIGHT FOOT COMPLETE - 3+ VIEW COMPARISON:  None. FINDINGS: Exam limited by positioning. Bones are diffusely demineralized. Cortical irregularity noted at the base of the fourth meta tarsal suggesting nonacute and potentially un healed fracture. No worrisome lytic or sclerotic osseous abnormality. IMPRESSION: Fracture at the proximal fourth metatarsal may be nonacute but unhealed. Stress injury not excluded. CT could be used to further evaluate as clinically warranted. Electronically Signed   By: Misty Stanley M.D.   On: 03/03/2020 14:17     Assessment &  Plan:  Plan  I am having Jeannine Boga maintain his aspirin, cetirizine, omeprazole, mometasone, albuterol, Multiple Vitamins-Minerals (MENS ONE DAILY PO), acetaminophen, HYDROcodone-acetaminophen, docusate sodium, methocarbamol, valACYclovir, triamcinolone cream, bromocriptine, ezetimibe, losartan, atorvastatin, and metoprolol tartrate.  No orders of the defined types were placed in this encounter.   Problem List Items Addressed This Visit      Unprioritized   Cognitive communication deficit    Pt is unable to work due to this and also severe pain / numbness in feet       Essential hypertension    Well controlled, no changes to meds. Encouraged heart healthy diet such as the DASH diet and exercise as tolerated.       Relevant Orders   Lipid panel   Comprehensive metabolic panel   Hyperlipidemia    Tolerating statin, encouraged heart healthy diet, avoid trans fats, minimize simple carbs and saturated fats. Increase exercise as tolerated      Pituitary adenoma ([place])    Per endo       Other Visit Diagnoses    Dyslipidemia    -  Primary   Relevant Orders   Lipid panel   Comprehensive metabolic panel      Follow-up: Return in about 6 months (around 11/16/2020) for annual exam, fasting.  [name], DO

## 2020-05-16 NOTE — Assessment & Plan Note (Signed)
Well controlled, no changes to meds. Encouraged heart healthy diet such as the DASH diet and exercise as tolerated.  °

## 2020-05-16 NOTE — Patient Instructions (Signed)
Fat and Cholesterol Restricted Eating Plan Getting too much fat and cholesterol in your diet may cause health problems. Choosing the right foods helps keep your fat and cholesterol at normal levels. This can keep you from getting certain diseases. Your doctor may recommend an eating plan that includes:  Total fat: ______% or less of total calories a day.  Saturated fat: ______% or less of total calories a day.  Cholesterol: less than _________mg a day.  Fiber: ______g a day. What are tips for following this plan? Meal planning  At meals, divide your plate into four equal parts: ? Fill one-half of your plate with vegetables and green salads. ? Fill one-fourth of your plate with whole grains. ? Fill one-fourth of your plate with low-fat (lean) protein foods.  Eat fish that is high in omega-3 fats at least two times a week. This includes mackerel, tuna, sardines, and salmon.  Eat foods that are high in fiber, such as whole grains, beans, apples, broccoli, carrots, peas, and barley. General tips   Work with your doctor to lose weight if you need to.  Avoid: ? Foods with added sugar. ? Fried foods. ? Foods with partially hydrogenated oils.  Limit alcohol intake to no more than 1 drink a day for nonpregnant women and 2 drinks a day for men. One drink equals 12 oz of beer, 5 oz of wine, or 1 oz of hard liquor. Reading food labels  Check food labels for: ? Trans fats. ? Partially hydrogenated oils. ? Saturated fat (g) in each serving. ? Cholesterol (mg) in each serving. ? Fiber (g) in each serving.  Choose foods with healthy fats, such as: ? Monounsaturated fats. ? Polyunsaturated fats. ? Omega-3 fats.  Choose grain products that have whole grains. Look for the word "whole" as the first word in the ingredient list. Cooking  Cook foods using low-fat methods. These include baking, boiling, grilling, and broiling.  Eat more home-cooked foods. Eat at restaurants and buffets  less often.  Avoid cooking using saturated fats, such as butter, cream, palm oil, palm kernel oil, and coconut oil. Recommended foods  Fruits  All fresh, canned (in natural juice), or frozen fruits. Vegetables  Fresh or frozen vegetables (raw, steamed, roasted, or grilled). Green salads. Grains  Whole grains, such as whole wheat or whole grain breads, crackers, cereals, and pasta. Unsweetened oatmeal, bulgur, barley, quinoa, or brown rice. Corn or whole wheat flour tortillas. Meats and other protein foods  Ground beef (85% or leaner), grass-fed beef, or beef trimmed of fat. Skinless chicken or turkey. Ground chicken or turkey. Pork trimmed of fat. All fish and seafood. Egg whites. Dried beans, peas, or lentils. Unsalted nuts or seeds. Unsalted canned beans. Nut butters without added sugar or oil. Dairy  Low-fat or nonfat dairy products, such as skim or 1% milk, 2% or reduced-fat cheeses, low-fat and fat-free ricotta or cottage cheese, or plain low-fat and nonfat yogurt. Fats and oils  Tub margarine without trans fats. Light or reduced-fat mayonnaise and salad dressings. Avocado. Olive, canola, sesame, or safflower oils. The items listed above may not be a complete list of foods and beverages you can eat. Contact a dietitian for more information. Foods to avoid Fruits  Canned fruit in heavy syrup. Fruit in cream or butter sauce. Fried fruit. Vegetables  Vegetables cooked in cheese, cream, or butter sauce. Fried vegetables. Grains  White bread. White pasta. White rice. Cornbread. Bagels, pastries, and croissants. Crackers and snack foods that contain trans fat   and hydrogenated oils. Meats and other protein foods  Fatty cuts of meat. Ribs, chicken wings, bacon, sausage, bologna, salami, chitterlings, fatback, hot dogs, bratwurst, and packaged lunch meats. Liver and organ meats. Whole eggs and egg yolks. Chicken and turkey with skin. Fried meat. Dairy  Whole or 2% milk, cream,  half-and-half, and cream cheese. Whole milk cheeses. Whole-fat or sweetened yogurt. Full-fat cheeses. Nondairy creamers and whipped toppings. Processed cheese, cheese spreads, and cheese curds. Beverages  Alcohol. Sugar-sweetened drinks such as sodas, lemonade, and fruit drinks. Fats and oils  Butter, stick margarine, lard, shortening, ghee, or bacon fat. Coconut, palm kernel, and palm oils. Sweets and desserts  Corn syrup, sugars, honey, and molasses. Candy. Jam and jelly. Syrup. Sweetened cereals. Cookies, pies, cakes, donuts, muffins, and ice cream. The items listed above may not be a complete list of foods and beverages you should avoid. Contact a dietitian for more information. Summary  Choosing the right foods helps keep your fat and cholesterol at normal levels. This can keep you from getting certain diseases.  At meals, fill one-half of your plate with vegetables and green salads.  Eat high-fiber foods, like whole grains, beans, apples, carrots, peas, and barley.  Limit added sugar, saturated fats, alcohol, and fried foods. This information is not intended to replace advice given to you by your health care provider. Make sure you discuss any questions you have with your health care provider. Document Revised: 06/21/2018 Document Reviewed: 07/05/2017 Elsevier Patient Education  2020 [employer]  

## 2020-05-16 NOTE — Assessment & Plan Note (Signed)
Tolerating statin, encouraged heart healthy diet, avoid trans fats, minimize simple carbs and saturated fats. Increase exercise as tolerated 

## 2020-05-16 NOTE — Assessment & Plan Note (Signed)
Pt is unable to work due to this and also severe pain / numbness in feet

## 2020-05-26 ENCOUNTER — Ambulatory Visit: Payer: Medicare Other | Admitting: Family Medicine

## 2020-07-16 ENCOUNTER — Other Ambulatory Visit: Payer: Self-pay

## 2020-07-16 ENCOUNTER — Ambulatory Visit (INDEPENDENT_AMBULATORY_CARE_PROVIDER_SITE_OTHER): Payer: Medicare Other | Admitting: Endocrinology

## 2020-07-16 VITALS — BP 112/72 | HR 70 | Ht 74.0 in | Wt 258.0 lb

## 2020-07-16 DIAGNOSIS — I25119 Atherosclerotic heart disease of native coronary artery with unspecified angina pectoris: Secondary | ICD-10-CM

## 2020-07-16 DIAGNOSIS — D352 Benign neoplasm of pituitary gland: Secondary | ICD-10-CM | POA: Diagnosis not present

## 2020-07-16 LAB — TSH: TSH: 1.6 u[IU]/mL (ref 0.35–4.50)

## 2020-07-16 LAB — T4, FREE: Free T4: 0.76 ng/dL (ref 0.60–1.60)

## 2020-07-16 NOTE — Patient Instructions (Addendum)
Blood tests are requested for you today.  We'll let you know about the results.  We can skip the MRI this year.   Please come back for a follow-up appointment in 6-12 months.

## 2020-07-16 NOTE — Progress Notes (Signed)
Subjective:    Patient ID: Michael Pearson, male    DOB: [birth year], 59 y.o.   MRN: [id number]  HPI Pt returns for f/u of pituitary microprolactinoma (in 2005, pt was incidentally noted to have a pituitary adenoma; in 2017, elev. prolactin was noted; f/u MRI in 2020 showed adenoma was down to 7 mm; h/o near-syncope limits bromocriptine dosage; other pituitary functions are normal).  He has occ mild headache.   Past Medical History:  Diagnosis Date  . Allergy   . Arthritis   . Asthma   . CAD (coronary artery disease)    NSTEMI 7/12:  Cardiac cath on 7/17: pLAD occluded, Dx 30-40%, pRCA 30-40%, mRCA 30-40%.  Proximal LAD was treated with a BMS.  Echo 7/19:  EF 60-65%, mild LVH.    ETT-Myoview 6/14:  Inf thinning, no ischemia, EF 64%, normal study // Myoview 04/2020: EF 57, normal perfusion; Low Risk    . Cognitive communication deficit   . Colitis   . Diverticulosis   . Duodenitis    May 2012 with heme positive stools at this time. Endorses only rare streaking of blood now.  . External hemorrhoids   . GERD (gastroesophageal reflux disease)   . Heart murmur    as a child per the pt  . Hiatal hernia   . Hx of hemorrhoids   . Hyperlipidemia   . Hypertension   . Kidney stones   . Microadenoma    MRI in 2008 demonstrating 4.6 mm area of pituitary gland   . Migraines    Has been evaluated multiple times in past for chronic headache in which pt has had occasional nose bleeds and bloodshot eyes. This lasted for 4-5 months. CT of the head was negative in May 2012.  . Myocardial infarction ([place]) 05/17/2011  . Pituitary tumor   . RBBB (right bundle branch block)    Noted on EKG in 2008  . Ulcer     Past Surgical History:  Procedure Laterality Date  . COLONOSCOPY    . egd  03/05/2011   Dr. Benson Norway  . FLEXIBLE SIGMOIDOSCOPY  03/05/2011   Dr. Benson [place]  . HAMMER TOE SURGERY    . Heart stent    . HERNIA REPAIR     In 20's  . ORIF TIBIA PLATEAU Left 08/04/2018   Procedure: OPEN REDUCTION INTERNAL  FIXATION (ORIF) TIBIAL PLATEAU;  Surgeon: Altamese Dorchester, MD;  Location: [facility];  Service: Orthopedics;  Laterality: Left;  . SIGMOIDOSCOPY    . TOOTH EXTRACTION    . UPPER GASTROINTESTINAL ENDOSCOPY      Social History   Socioeconomic History  . Marital status: Single    Spouse name: Not on file  . Number of children: Not on file  . Years of education: Not on file  . Highest education level: Not on file  Occupational History  . Occupation: Personnel officer-- cleans 3 buildings    Comment: Fairly physical job    Employer: [employer]  Tobacco Use  . Smoking status: Never Smoker  . Smokeless tobacco: Never Used  Vaping Use  . Vaping Use: Never used  Substance and Sexual Activity  . Alcohol use: No  . Drug use: No  . Sexual activity: Not Currently  Other Topics Concern  . Not on file  Social History Narrative   Lives with brother and mother   Mother is quite sick, he and his brother take turns taking care of her   No children   Social Determinants  of Health   Financial Resource Strain:   . Difficulty of Paying Living Expenses: Not on file  Food Insecurity:   . Worried About Charity fundraiser in the Last Year: Not on file  . Ran Out of Food in the Last Year: Not on file  Transportation Needs:   . Lack of Transportation (Medical): Not on file  . Lack of Transportation (Non-Medical): Not on file  Physical Activity:   . Days of Exercise per Week: Not on file  . Minutes of Exercise per Session: Not on file  Stress:   . Feeling of Stress : Not on file  Social Connections:   . Frequency of Communication with Friends and Family: Not on file  . Frequency of Social Gatherings with Friends and Family: Not on file  . Attends Religious Services: Not on file  . Active Member of Clubs or Organizations: Not on file  . Attends Archivist Meetings: Not on file  . Marital Status: Not on file  Intimate Partner Violence:   . Fear of Current or Ex-Partner: Not on file   . Emotionally Abused: Not on file  . Physically Abused: Not on file  . Sexually Abused: Not on file    Current Outpatient Medications on File Prior to Visit  Medication Sig Dispense Refill  . acetaminophen (TYLENOL) 500 MG tablet Take 1 tablet (500 mg total) by mouth every 12 (twelve) hours. 60 tablet 0  . albuterol (PROVENTIL HFA;VENTOLIN HFA) 108 (90 Base) MCG/ACT inhaler Inhale 2 puffs into the lungs every 4 (four) hours as needed for wheezing or shortness of breath. 1 Inhaler 2  . aspirin 81 MG tablet Take 81 mg by mouth daily.      Marland Kitchen atorvastatin (LIPITOR) 40 MG tablet Take 1 tablet (40 mg total) by mouth daily. 90 tablet 0  . bromocriptine (PARLODEL) 2.5 MG tablet TAKE ONE TABLET BY MOUTH TWICE DAILY 180 tablet 0  . cetirizine (ZYRTEC) 10 MG tablet Take 10 mg by mouth daily as needed for allergies.     Marland Kitchen docusate sodium (COLACE) 100 MG capsule Take 1 capsule (100 mg total) by mouth 2 (two) times daily. 20 capsule 0  . ezetimibe (ZETIA) 10 MG tablet Take 1 tablet (10 mg total) by mouth daily. 90 tablet 0  . HYDROcodone-acetaminophen (NORCO) 7.5-325 MG tablet Take 1-2 tablets by mouth every 6 (six) hours as needed for moderate pain or severe pain. 50 tablet 0  . losartan (COZAAR) 50 MG tablet Take 1 tablet (50 mg total) by mouth daily. 90 tablet 0  . methocarbamol (ROBAXIN) 500 MG tablet Take 1 tablet (500 mg total) by mouth every 8 (eight) hours as needed for muscle spasms. 60 tablet 0  . metoprolol tartrate (LOPRESSOR) 25 MG tablet Take 0.5 tablets (12.5 mg total) by mouth 2 (two) times daily. 90 tablet 0  . mometasone (NASONEX) 50 MCG/ACT nasal spray Place 2 sprays into the nose daily. 17 g 2  . Multiple Vitamins-Minerals (MENS ONE DAILY PO) Take 1 tablet by mouth daily.    Marland Kitchen omeprazole (PRILOSEC) 40 MG capsule Take 1 capsule (40 mg total) by mouth daily. 30 minutes before breakfast 30 capsule 3  . triamcinolone cream (KENALOG) 0.1 % Apply 1 application topically 2 (two) times daily.  30 g 2  . valACYclovir (VALTREX) 1000 MG tablet Take 1 tablet (1,000 mg total) by mouth 3 (three) times daily. 30 tablet 0   No current facility-administered medications on file prior to visit.  No Known Allergies  Family History  Problem Relation Age of Onset  . Stroke Mother   . Heart attack Father 49       MI  . Skin cancer Father   . Cancer Father        ? type "rare"  . Obesity Brother   . Coronary artery disease Brother        Possible, pt not sure of specifics  . Hypertension Brother   . Colon polyps Maternal Aunt   . Colon cancer Maternal Aunt   . Colon polyps Paternal Grandmother   . Other Neg Hx   . Esophageal cancer Neg Hx   . Rectal cancer Neg Hx   . Stomach cancer Neg Hx     BP 112/72   Pulse 70   Ht 6\' 2"  (1.88 m)   Wt 258 lb (117 kg)   SpO2 95%   BMI 33.13 kg/m    Review of Systems  Denies nausea and visual loss.    Objective:   Physical Exam VITAL SIGNS:  See vs page.   GENERAL: no distress.   GAIT: normal and steady.      Lab Results  Component Value Date   CREATININE 0.91 05/16/2020   BUN 13 05/16/2020   NA 138 05/16/2020   K 3.8 05/16/2020   CL 104 05/16/2020   CO2 28 05/16/2020   Prolactin=6    Assessment & Plan:  Hyperprolactinemia: well-controlled.  Please continue the same parlodel. Pituitary adenoma: we discussed.  Rechecking now vs waiting another year.  He chooses to wait.   Patient Instructions  Blood tests are requested for you today.  We'll let you know about the results.  We can skip the MRI this year.   Please come back for a follow-up appointment in 6-12 months.

## 2020-07-17 LAB — PROLACTIN: Prolactin: 6 ng/mL (ref 2.0–18.0)

## 2020-07-21 ENCOUNTER — Telehealth: Payer: Self-pay | Admitting: Endocrinology

## 2020-07-21 NOTE — Telephone Encounter (Signed)
Patient requests to be called at ph# [phone] re: Patient states he is having difficulty standing up-unable to get his balance-everything going around in circles. Patient states while laying down he feels like everything is spinning. Patient states he is nauseated.

## 2020-07-21 NOTE — Telephone Encounter (Signed)
D/c bromocriptine Please come back for a follow-up appointment in 2-3 weeks

## 2020-07-21 NOTE — Telephone Encounter (Signed)
Is this something you would like to address or defer to PCP?

## 2020-07-21 NOTE — Telephone Encounter (Signed)
Called pt and gave him MD message. Pt verbalized understanding. 

## 2020-07-28 NOTE — Telephone Encounter (Signed)
Spoke with patient and advised Dr Loanne Drilling is out of the office.  Patient is going to follow up with PCP or go to Urgent Care, ED if necessary.

## 2020-07-28 NOTE — Telephone Encounter (Signed)
Symptoms still persist dizzy upon getting up, lying down, bending down to tie shoes almost fall down. Pt last took dose bromocriptine on Monday 07/21/20. Please advise [phone].

## 2020-07-30 ENCOUNTER — Telehealth: Payer: Medicare Other | Admitting: Family Medicine

## 2020-07-31 ENCOUNTER — Encounter: Payer: Self-pay | Admitting: Family Medicine

## 2020-07-31 ENCOUNTER — Ambulatory Visit (INDEPENDENT_AMBULATORY_CARE_PROVIDER_SITE_OTHER): Payer: Medicare Other | Admitting: Family Medicine

## 2020-07-31 ENCOUNTER — Other Ambulatory Visit: Payer: Self-pay

## 2020-07-31 VITALS — BP 100/60 | HR 65 | Temp 97.8°F | Resp 18 | Ht 74.0 in | Wt 261.8 lb

## 2020-07-31 DIAGNOSIS — R42 Dizziness and giddiness: Secondary | ICD-10-CM | POA: Diagnosis not present

## 2020-07-31 DIAGNOSIS — I10 Essential (primary) hypertension: Secondary | ICD-10-CM | POA: Diagnosis not present

## 2020-07-31 DIAGNOSIS — Z23 Encounter for immunization: Secondary | ICD-10-CM | POA: Diagnosis not present

## 2020-07-31 DIAGNOSIS — I25119 Atherosclerotic heart disease of native coronary artery with unspecified angina pectoris: Secondary | ICD-10-CM | POA: Diagnosis not present

## 2020-07-31 MED ORDER — METOPROLOL TARTRATE 25 MG PO TABS: ORAL_TABLET | ORAL | 0 refills | Status: DC

## 2020-07-31 NOTE — Assessment & Plan Note (Signed)
Hold bromocriptine per endo Wean off metoprolol F/u 2-3 weeks

## 2020-07-31 NOTE — Assessment & Plan Note (Signed)
Well controlled, no changes to meds. Encouraged heart healthy diet such as the DASH diet and exercise as tolerated. --- running low--- wean off metoprolol F/u 2-3 weeks

## 2020-07-31 NOTE — Progress Notes (Signed)
Patient ID: Michael Pearson, male    DOB: 06/09/1961  Age: 59 y.o. MRN: [id number]    Subjective:  Subjective  HPI Michael Pearson presents for dizziness x 3 weeks    Worse when laying down and makes him feel like he is falling back on the bed  Review of Systems  Constitutional: Negative for appetite change, diaphoresis, fatigue and unexpected weight change.  Eyes: Negative for pain, redness and visual disturbance.  Respiratory: Negative for cough, chest tightness, shortness of breath and wheezing.   Cardiovascular: Negative for chest pain, palpitations and leg swelling.  Endocrine: Negative for cold intolerance, heat intolerance, polydipsia, polyphagia and polyuria.  Genitourinary: Negative for difficulty urinating, dysuria and frequency.  Neurological: Positive for dizziness and light-headedness. Negative for numbness and headaches.    History Past Medical History:  Diagnosis Date  . Allergy   . Arthritis   . Asthma   . CAD (coronary artery disease)    NSTEMI 7/12:  Cardiac cath on 7/17: pLAD occluded, Dx 30-40%, pRCA 30-40%, mRCA 30-40%.  Proximal LAD was treated with a BMS.  Echo 7/19:  EF 60-65%, mild LVH.    ETT-Myoview 6/14:  Inf thinning, no ischemia, EF 64%, normal study // Myoview 04/2020: EF 57, normal perfusion; Low Risk    . Cognitive communication deficit   . Colitis   . Diverticulosis   . Duodenitis    May 2012 with heme positive stools at this time. Endorses only rare streaking of blood now.  . External hemorrhoids   . GERD (gastroesophageal reflux disease)   . Heart murmur    as a child per the pt  . Hiatal hernia   . Hx of hemorrhoids   . Hyperlipidemia   . Hypertension   . Kidney stones   . Microadenoma    MRI in 2008 demonstrating 4.6 mm area of pituitary gland   . Migraines    Has been evaluated multiple times in past for chronic headache in which pt has had occasional nose bleeds and bloodshot eyes. This lasted for 4-5 months. CT of the head was negative in May  2012.  . Myocardial infarction ([place]) 05/17/2011  . Pituitary tumor   . RBBB (right bundle branch block)    Noted on EKG in 2008  . Ulcer     He has a past surgical history that includes Hernia repair; Tooth extraction; Hammer toe surgery; egd (03/05/2011); Flexible sigmoidoscopy (03/05/2011); Heart stent; Upper gastrointestinal endoscopy; Colonoscopy; Sigmoidoscopy; and ORIF tibia plateau (Left, 08/04/2018).   His family history includes Cancer in his father; Colon cancer in his maternal aunt; Colon polyps in his maternal aunt and paternal grandmother; Coronary artery disease in his brother; Heart attack (age of onset: 43) in his father; Hypertension in his brother; Obesity in his brother; Skin cancer in his father; Stroke in his mother.He reports that he has never smoked. He has never used smokeless tobacco. He reports that he does not drink alcohol and does not use drugs.  Current Outpatient Medications on File Prior to Visit  Medication Sig Dispense Refill  . acetaminophen (TYLENOL) 500 MG tablet Take 1 tablet (500 mg total) by mouth every 12 (twelve) hours. 60 tablet 0  . albuterol (PROVENTIL HFA;VENTOLIN HFA) 108 (90 Base) MCG/ACT inhaler Inhale 2 puffs into the lungs every 4 (four) hours as needed for wheezing or shortness of breath. 1 Inhaler 2  . aspirin 81 MG tablet Take 81 mg by mouth daily.      Marland Kitchen atorvastatin (  LIPITOR) 40 MG tablet Take 1 tablet (40 mg total) by mouth daily. 90 tablet 0  . bromocriptine (PARLODEL) 2.5 MG tablet TAKE ONE TABLET BY MOUTH TWICE DAILY 180 tablet 0  . cetirizine (ZYRTEC) 10 MG tablet Take 10 mg by mouth daily as needed for allergies.     Marland Kitchen docusate sodium (COLACE) 100 MG capsule Take 1 capsule (100 mg total) by mouth 2 (two) times daily. 20 capsule 0  . ezetimibe (ZETIA) 10 MG tablet Take 1 tablet (10 mg total) by mouth daily. 90 tablet 0  . HYDROcodone-acetaminophen (NORCO) 7.5-325 MG tablet Take 1-2 tablets by mouth every 6 (six) hours as needed for moderate  pain or severe pain. 50 tablet 0  . losartan (COZAAR) 50 MG tablet Take 1 tablet (50 mg total) by mouth daily. 90 tablet 0  . methocarbamol (ROBAXIN) 500 MG tablet Take 1 tablet (500 mg total) by mouth every 8 (eight) hours as needed for muscle spasms. 60 tablet 0  . mometasone (NASONEX) 50 MCG/ACT nasal spray Place 2 sprays into the nose daily. 17 g 2  . Multiple Vitamins-Minerals (MENS ONE DAILY PO) Take 1 tablet by mouth daily.    Marland Kitchen omeprazole (PRILOSEC) 40 MG capsule Take 1 capsule (40 mg total) by mouth daily. 30 minutes before breakfast 30 capsule 3  . triamcinolone cream (KENALOG) 0.1 % Apply 1 application topically 2 (two) times daily. 30 g 2  . valACYclovir (VALTREX) 1000 MG tablet Take 1 tablet (1,000 mg total) by mouth 3 (three) times daily. 30 tablet 0   No current facility-administered medications on file prior to visit.     Objective:  Objective  Physical Exam Vitals and nursing note reviewed.  Constitutional:      General: He is sleeping.     Appearance: He is well-developed.  HENT:     Head: Normocephalic and atraumatic.  Eyes:     Pupils: Pupils are equal, round, and reactive to light.  Neck:     Thyroid: No thyromegaly.  Cardiovascular:     Rate and Rhythm: Normal rate and regular rhythm.     Heart sounds: No murmur heard.   Pulmonary:     Effort: Pulmonary effort is normal. No respiratory distress.     Breath sounds: Normal breath sounds. No wheezing or rales.  Chest:     Chest wall: No tenderness.  Musculoskeletal:        General: No tenderness.     Cervical back: Normal range of motion and neck supple.  Skin:    General: Skin is warm and dry.  Neurological:     Mental Status: He is oriented to person, place, and time.  Psychiatric:        Behavior: Behavior normal.        Thought Content: Thought content normal.        Judgment: Judgment normal.    BP 100/60   Pulse 65   Temp 97.8 F (36.6 C) (Oral)   Resp 18   Ht 6\' 2"  (1.88 m)   Wt 261 lb  12.8 oz (118.8 kg)   SpO2 94%   BMI 33.61 kg/m  Wt Readings from Last 3 Encounters:  07/31/20 261 lb 12.8 oz (118.8 kg)  07/16/20 258 lb (117 kg)  05/16/20 256 lb 9.6 oz (116.4 kg)   ekg-- RBBB  Lab Results  Component Value Date   WBC 6.1 03/03/2020   HGB 15.0 03/03/2020   HCT 45.0 03/03/2020   PLT 171.0 03/03/2020  GLUCOSE 97 05/16/2020   CHOL 92 05/16/2020   TRIG 47.0 05/16/2020   HDL 47.10 05/16/2020   LDLCALC 36 05/16/2020   ALT 37 05/16/2020   AST 24 05/16/2020   NA 138 05/16/2020   K 3.8 05/16/2020   CL 104 05/16/2020   CREATININE 0.91 05/16/2020   BUN 13 05/16/2020   CO2 28 05/16/2020   TSH 1.60 07/16/2020   PSA 1.09 05/29/2019   INR 0.97 05/17/2011   HGBA1C 5.8 09/17/2014   MICROALBUR 1.5 11/20/2015    DG Foot Complete Right  Result Date: 03/03/2020 CLINICAL DATA:  Pain. Edema. EXAM: RIGHT FOOT COMPLETE - 3+ VIEW COMPARISON:  None. FINDINGS: Exam limited by positioning. Bones are diffusely demineralized. Cortical irregularity noted at the base of the fourth meta tarsal suggesting nonacute and potentially un healed fracture. No worrisome lytic or sclerotic osseous abnormality. IMPRESSION: Fracture at the proximal fourth metatarsal may be nonacute but unhealed. Stress injury not excluded. CT could be used to further evaluate as clinically warranted. Electronically Signed   By: Misty Stanley M.D.   On: 03/03/2020 14:17     Assessment & Plan:  Plan  I have changed [place] metoprolol tartrate. I am also having him maintain his aspirin, cetirizine, omeprazole, mometasone, albuterol, Multiple Vitamins-Minerals (MENS ONE DAILY PO), acetaminophen, HYDROcodone-acetaminophen, docusate sodium, methocarbamol, valACYclovir, triamcinolone cream, bromocriptine, ezetimibe, losartan, and atorvastatin.  Meds ordered this encounter  Medications  . metoprolol tartrate (LOPRESSOR) 25 MG tablet    Sig: 1/2 po qd x 1 week then stop    Dispense:  90 tablet    Refill:  0     Problem List Items Addressed This Visit      Unprioritized   Dizziness - Primary    Hold bromocriptine per endo Wean off metoprolol F/u 2-3 weeks       Relevant Orders   EKG 12-Lead (Completed)   Essential hypertension    Well controlled, no changes to meds. Encouraged heart healthy diet such as the DASH diet and exercise as tolerated. --- running low--- wean off metoprolol F/u 2-3 weeks       Relevant Medications   metoprolol tartrate (LOPRESSOR) 25 MG tablet    Other Visit Diagnoses    Need for influenza vaccination       Relevant Orders   Flu Vaccine QUAD 36+ mos IM (Completed)      Follow-up: No follow-ups on file.  [name], DO

## 2020-07-31 NOTE — Patient Instructions (Addendum)
Decrease your metoprolol to 25 mg 1/2 , 1 x a day for 1 week then stop  F/u with Dr Loanne Drilling in 2-3 weeks  Also per Dr Loanne Drilling --- stop the bromocriptine     Dizziness Dizziness is a common problem. It is a feeling of unsteadiness or light-headedness. You may feel like you are about to faint. Dizziness can lead to injury if you stumble or fall. Anyone can become dizzy, but dizziness is more common in older adults. This condition can be caused by a number of things, including medicines, dehydration, or illness. Follow these instructions at home: Eating and drinking  Drink enough fluid to keep your urine clear or pale yellow. This helps to keep you from becoming dehydrated. Try to drink more clear fluids, such as water.  Do not drink alcohol.  Limit your caffeine intake if told to do so by your health care provider. Check ingredients and nutrition facts to see if a food or beverage contains caffeine.  Limit your salt (sodium) intake if told to do so by your health care provider. Check ingredients and nutrition facts to see if a food or beverage contains sodium. Activity  Avoid making quick movements. ? Rise slowly from chairs and steady yourself until you feel okay. ? In the morning, first sit up on the side of the bed. When you feel okay, stand slowly while you hold onto something until you know that your balance is fine.  If you need to stand in one place for a long time, move your legs often. Tighten and relax the muscles in your legs while you are standing.  Do not drive or use heavy machinery if you feel dizzy.  Avoid bending down if you feel dizzy. Place items in your home so that they are easy for you to reach without leaning over. Lifestyle  Do not use any products that contain nicotine or tobacco, such as cigarettes and e-cigarettes. If you need help quitting, ask your health care provider.  Try to reduce your stress level by using methods such as yoga or meditation. Talk  with your health care provider if you need help to manage your stress. General instructions  Watch your dizziness for any changes.  Take over-the-counter and prescription medicines only as told by your health care provider. Talk with your health care provider if you think that your dizziness is caused by a medicine that you are taking.  Tell a friend or a family member that you are feeling dizzy. If he or she notices any changes in your behavior, have this person call your health care provider.  Keep all follow-up visits as told by your health care provider. This is important. Contact a health care provider if:  Your dizziness does not go away.  Your dizziness or light-headedness gets worse.  You feel nauseous.  You have reduced hearing.  You have new symptoms.  You are unsteady on your feet or you feel like the room is spinning. Get help right away if:  You vomit or have diarrhea and are unable to eat or drink anything.  You have problems talking, walking, swallowing, or using your arms, hands, or legs.  You feel generally weak.  You are not thinking clearly or you have trouble forming sentences. It may take a friend or family member to notice this.  You have chest pain, abdominal pain, shortness of breath, or sweating.  Your vision changes.  You have any bleeding.  You have a severe headache.  You have neck pain or a stiff neck.  You have a fever. These symptoms may represent a serious problem that is an emergency. Do not wait to see if the symptoms will go away. Get medical help right away. Call your local emergency services (911 in the [place] Do not drive yourself to the hospital. Summary  Dizziness is a feeling of unsteadiness or light-headedness. This condition can be caused by a number of things, including medicines, dehydration, or illness.  Anyone can become dizzy, but dizziness is more common in older adults.  Drink enough fluid to keep your urine clear or  pale yellow. Do not drink alcohol.  Avoid making quick movements if you feel dizzy. Monitor your dizziness for any changes. This information is not intended to replace advice given to you by your health care provider. Make sure you discuss any questions you have with your health care provider. Document Revised: 10/21/2017 Document Reviewed: 11/20/2016 Elsevier Patient Education  2020 [employer].

## 2020-08-02 ENCOUNTER — Other Ambulatory Visit: Payer: Self-pay | Admitting: Family Medicine

## 2020-08-02 DIAGNOSIS — I2510 Atherosclerotic heart disease of native coronary artery without angina pectoris: Secondary | ICD-10-CM

## 2020-08-02 DIAGNOSIS — I10 Essential (primary) hypertension: Secondary | ICD-10-CM

## 2020-08-02 DIAGNOSIS — E785 Hyperlipidemia, unspecified: Secondary | ICD-10-CM

## 2020-08-14 ENCOUNTER — Ambulatory Visit: Payer: Medicare Other | Admitting: Endocrinology

## 2020-08-15 ENCOUNTER — Other Ambulatory Visit: Payer: Self-pay

## 2020-08-15 ENCOUNTER — Encounter: Payer: Self-pay | Admitting: Endocrinology

## 2020-08-15 ENCOUNTER — Ambulatory Visit (INDEPENDENT_AMBULATORY_CARE_PROVIDER_SITE_OTHER): Payer: Medicare Other | Admitting: Endocrinology

## 2020-08-15 VITALS — BP 102/60 | HR 74 | Ht 74.0 in | Wt 265.4 lb

## 2020-08-15 DIAGNOSIS — D352 Benign neoplasm of pituitary gland: Secondary | ICD-10-CM | POA: Diagnosis not present

## 2020-08-15 DIAGNOSIS — I25119 Atherosclerotic heart disease of native coronary artery with unspecified angina pectoris: Secondary | ICD-10-CM | POA: Diagnosis not present

## 2020-08-15 LAB — BASIC METABOLIC PANEL
BUN: 14 mg/dL (ref 6–23)
CO2: 29 mEq/L (ref 19–32)
Calcium: 9.4 mg/dL (ref 8.4–10.5)
Chloride: 103 mEq/L (ref 96–112)
Creatinine, Ser: 1.04 mg/dL (ref 0.40–1.50)
GFR: 77.87 mL/min (ref >60.00)
Glucose, Bld: 142 mg/dL — ABNORMAL HIGH (ref 70–99)
Potassium: 4.1 mEq/L (ref 3.5–5.1)
Sodium: 138 mEq/L (ref 135–145)

## 2020-08-15 LAB — CORTISOL
Cortisol, Plasma: 5 ug/dL
Cortisol, Plasma: 32.3 ug/dL

## 2020-08-15 MED ORDER — COSYNTROPIN 0.25 MG IJ SOLR
0.2500 mg | Freq: Once | INTRAMUSCULAR | Status: AC
  Administered 2020-08-15: 0.25 mg via INTRAMUSCULAR

## 2020-08-15 NOTE — Progress Notes (Signed)
Subjective:    Patient ID: Michael Pearson, male    DOB: [birth year], 59 y.o.   MRN: [id number]  HPI Pt returns for f/u of pituitary microprolactinoma (in 2005, pt was incidentally noted to have a pituitary adenoma; in 2017, elev. prolactin was noted; f/u MRI in 2020 showed adenoma was down to 7 mm; h/o near-syncope limits bromocriptine dosage; other pituitary functions are normal).  He had to d/c parlodel, due to lightheadedness.  Since off it, lightheadedness did not improve, and headache has recurred.   Past Medical History:  Diagnosis Date  . Allergy   . Arthritis   . Asthma   . CAD (coronary artery disease)    NSTEMI 7/12:  Cardiac cath on 7/17: pLAD occluded, Dx 30-40%, pRCA 30-40%, mRCA 30-40%.  Proximal LAD was treated with a BMS.  Echo 7/19:  EF 60-65%, mild LVH.    ETT-Myoview 6/14:  Inf thinning, no ischemia, EF 64%, normal study // Myoview 04/2020: EF 57, normal perfusion; Low Risk    . Cognitive communication deficit   . Colitis   . Diverticulosis   . Duodenitis    May 2012 with heme positive stools at this time. Endorses only rare streaking of blood now.  . External hemorrhoids   . GERD (gastroesophageal reflux disease)   . Heart murmur    as a child per the pt  . Hiatal hernia   . Hx of hemorrhoids   . Hyperlipidemia   . Hypertension   . Kidney stones   . Microadenoma    MRI in 2008 demonstrating 4.6 mm area of pituitary gland   . Migraines    Has been evaluated multiple times in past for chronic headache in which pt has had occasional nose bleeds and bloodshot eyes. This lasted for 4-5 months. CT of the head was negative in May 2012.  . Myocardial infarction ([place]) 05/17/2011  . Pituitary tumor   . RBBB (right bundle branch block)    Noted on EKG in 2008  . Ulcer     Past Surgical History:  Procedure Laterality Date  . COLONOSCOPY    . egd  03/05/2011   Dr. Benson Norway  . FLEXIBLE SIGMOIDOSCOPY  03/05/2011   Dr. Benson Norway  . HAMMER TOE SURGERY    . Heart stent    . HERNIA  REPAIR     In 20's  . ORIF TIBIA PLATEAU Left 08/04/2018   Procedure: OPEN REDUCTION INTERNAL FIXATION (ORIF) TIBIAL PLATEAU;  Surgeon: Altamese Le Rays[place], MD;  Location: [facility];  Service: Orthopedics;  Laterality: Left;  . SIGMOIDOSCOPY    . TOOTH EXTRACTION    . UPPER GASTROINTESTINAL ENDOSCOPY      Social History   Socioeconomic History  . Marital status: Single    Spouse name: Not on file  . Number of children: Not on file  . Years of education: Not on file  . Highest education level: Not on file  Occupational History  . Occupation: Personnel officer-- cleans 3 buildings    Comment: Fairly physical job    Employer: [employer]  Tobacco Use  . Smoking status: Never Smoker  . Smokeless tobacco: Never Used  Vaping Use  . Vaping Use: Never used  Substance and Sexual Activity  . Alcohol use: No  . Drug use: No  . Sexual activity: Not Currently  Other Topics Concern  . Not on file  Social History Narrative   Lives with brother and mother   Mother is quite sick, he and his  brother take turns taking care of her   No children   Social Determinants of Health   Financial Resource Strain:   . Difficulty of Paying Living Expenses: Not on file  Food Insecurity:   . Worried About Charity fundraiser in the Last Year: Not on file  . Ran Out of Food in the Last Year: Not on file  Transportation Needs:   . Lack of Transportation (Medical): Not on file  . Lack of Transportation (Non-Medical): Not on file  Physical Activity:   . Days of Exercise per Week: Not on file  . Minutes of Exercise per Session: Not on file  Stress:   . Feeling of Stress : Not on file  Social Connections:   . Frequency of Communication with Friends and Family: Not on file  . Frequency of Social Gatherings with Friends and Family: Not on file  . Attends Religious Services: Not on file  . Active Member of Clubs or Organizations: Not on file  . Attends Archivist Meetings: Not on file  . Marital  Status: Not on file  Intimate Partner Violence:   . Fear of Current or Ex-Partner: Not on file  . Emotionally Abused: Not on file  . Physically Abused: Not on file  . Sexually Abused: Not on file    Current Outpatient Medications on File Prior to Visit  Medication Sig Dispense Refill  . acetaminophen (TYLENOL) 500 MG tablet Take 1 tablet (500 mg total) by mouth every 12 (twelve) hours. 60 tablet 0  . albuterol (PROVENTIL HFA;VENTOLIN HFA) 108 (90 Base) MCG/ACT inhaler Inhale 2 puffs into the lungs every 4 (four) hours as needed for wheezing or shortness of breath. 1 Inhaler 2  . aspirin 81 MG tablet Take 81 mg by mouth daily.      Marland Kitchen atorvastatin (LIPITOR) 40 MG tablet TAKE ONE TABLET BY MOUTH ONE TIME DAILY 90 tablet 0  . cetirizine (ZYRTEC) 10 MG tablet Take 10 mg by mouth daily as needed for allergies.     Marland Kitchen docusate sodium (COLACE) 100 MG capsule Take 1 capsule (100 mg total) by mouth 2 (two) times daily. 20 capsule 0  . ezetimibe (ZETIA) 10 MG tablet TAKE ONE TABLET BY MOUTH ONE TIME DAILY 90 tablet 0  . HYDROcodone-acetaminophen (NORCO) 7.5-325 MG tablet Take 1-2 tablets by mouth every 6 (six) hours as needed for moderate pain or severe pain. 50 tablet 0  . losartan (COZAAR) 50 MG tablet TAKE ONE TABLET BY MOUTH ONE TIME DAILY 90 tablet 0  . methocarbamol (ROBAXIN) 500 MG tablet Take 1 tablet (500 mg total) by mouth every 8 (eight) hours as needed for muscle spasms. 60 tablet 0  . mometasone (NASONEX) 50 MCG/ACT nasal spray Place 2 sprays into the nose daily. 17 g 2  . Multiple Vitamins-Minerals (MENS ONE DAILY PO) Take 1 tablet by mouth daily.    Marland Kitchen omeprazole (PRILOSEC) 40 MG capsule Take 1 capsule (40 mg total) by mouth daily. 30 minutes before breakfast 30 capsule 3  . triamcinolone cream (KENALOG) 0.1 % Apply 1 application topically 2 (two) times daily. 30 g 2  . valACYclovir (VALTREX) 1000 MG tablet Take 1 tablet (1,000 mg total) by mouth 3 (three) times daily. 30 tablet 0  .  bromocriptine (PARLODEL) 2.5 MG tablet TAKE ONE TABLET BY MOUTH TWICE DAILY (Patient not taking: Reported on 08/15/2020) 180 tablet 0  . metoprolol tartrate (LOPRESSOR) 25 MG tablet 1/2 po qd x 1 week then stop (Patient  not taking: Reported on 08/15/2020) 90 tablet 0   No current facility-administered medications on file prior to visit.    No Known Allergies  Family History  Problem Relation Age of Onset  . Stroke Mother   . Heart attack Father 69       MI  . Skin cancer Father   . Cancer Father        ? type "rare"  . Obesity Brother   . Coronary artery disease Brother        Possible, pt not sure of specifics  . Hypertension Brother   . Colon polyps Maternal Aunt   . Colon cancer Maternal Aunt   . Colon polyps Paternal Grandmother   . Other Neg Hx   . Esophageal cancer Neg Hx   . Rectal cancer Neg Hx   . Stomach cancer Neg Hx     BP 102/60   Pulse 74   Ht 6\' 2"  (1.88 m)   Wt 265 lb 6.4 oz (120.4 kg)   SpO2 95%   BMI 34.08 kg/m    Review of Systems Denies LOC    Objective:   Physical Exam VITAL SIGNS:  See vs page GENERAL: no distress GAIT: normal and steady.        Assessment & Plan:  Lightheadedness: unlikely due to parlodel, but we'll hold off on rx for now Pit adenoma: in view of the above, we'll recheck.   Patient Instructions  Blood tests are requested for you today.  We'll let you know about the results.  Let's recheck the MRI.  you will receive a phone call, about a day and time for an appointment. Please come back for a follow-up appointment in 6 weeks.

## 2020-08-15 NOTE — Patient Instructions (Addendum)
Blood tests are requested for you today.  We'll let you know about the results.  Let's recheck the MRI.  you will receive a phone call, about a day and time for an appointment. Please come back for a follow-up appointment in 6 weeks.

## 2020-08-16 LAB — PROLACTIN: Prolactin: 112.8 ng/mL — ABNORMAL HIGH (ref 2.0–18.0)

## 2020-08-21 ENCOUNTER — Telehealth: Payer: Self-pay | Admitting: Endocrinology

## 2020-08-21 NOTE — Telephone Encounter (Signed)
Pt called for lab results. Please call pt [phone].

## 2020-08-21 NOTE — Telephone Encounter (Signed)
Informed patient of results and recommendations. 

## 2020-09-14 ENCOUNTER — Ambulatory Visit
Admission: RE | Admit: 2020-09-14 | Discharge: 2020-09-14 | Disposition: A | Discharge status: Home / Self Care | Payer: Medicare Other | Source: Ambulatory Visit | Attending: Endocrinology | Admitting: Endocrinology

## 2020-09-14 ENCOUNTER — Other Ambulatory Visit: Payer: Self-pay

## 2020-09-14 DIAGNOSIS — D352 Benign neoplasm of pituitary gland: Secondary | ICD-10-CM | POA: Diagnosis not present

## 2020-09-14 DIAGNOSIS — E237 Disorder of pituitary gland, unspecified: Secondary | ICD-10-CM | POA: Diagnosis not present

## 2020-09-14 DIAGNOSIS — C719 Malignant neoplasm of brain, unspecified: Secondary | ICD-10-CM | POA: Diagnosis not present

## 2020-09-14 DIAGNOSIS — G9389 Other specified disorders of brain: Secondary | ICD-10-CM | POA: Diagnosis not present

## 2020-09-14 MED ORDER — GADOBENATE DIMEGLUMINE 529 MG/ML IV SOLN
10.0000 mL | Freq: Once | INTRAVENOUS | Status: AC | PRN
  Administered 2020-09-14: 10 mL via INTRAVENOUS

## 2020-09-29 ENCOUNTER — Telehealth: Payer: Self-pay | Admitting: Endocrinology

## 2020-09-29 NOTE — Telephone Encounter (Signed)
Notified pt with the lab results

## 2020-09-29 NOTE — Telephone Encounter (Signed)
Pt called for LAB Results from October 2021.  Informed pt of prior note regarding pt being notified of results but pt does not remember the phone call. Please call pt [phone].

## 2020-11-10 ENCOUNTER — Other Ambulatory Visit: Payer: Self-pay | Admitting: Family Medicine

## 2020-11-10 DIAGNOSIS — I2510 Atherosclerotic heart disease of native coronary artery without angina pectoris: Secondary | ICD-10-CM

## 2020-11-10 DIAGNOSIS — I10 Essential (primary) hypertension: Secondary | ICD-10-CM

## 2020-11-10 DIAGNOSIS — E785 Hyperlipidemia, unspecified: Secondary | ICD-10-CM

## 2020-11-21 ENCOUNTER — Ambulatory Visit: Payer: Medicare Other | Admitting: Family Medicine

## 2020-12-04 IMAGING — MR MR HEAD WO/W CM
19 series · 48 of 48 positions shown · IV contrast (multihance)
Comparison: MRI of the brain June 14, 2019.

CLINICAL DATA: Brain/CNS neoplasm, surveillance (pituitary
adenoma).

EXAM:
MRI HEAD WITHOUT AND WITH CONTRAST
TECHNIQUE: Multiplanar, multiecho pulse sequences of the brain and surrounding
structures were obtained without and with intravenous contrast.
CONTRAST:  10mL MULTIHANCE GADOBENATE DIMEGLUMINE 529 MG/ML IV SOLN

[Series 5: T1 · sagittal · 4.5mm · 0.72mm/px · 1 of 30 slices shown (1 of 5)]
[im 1/30]
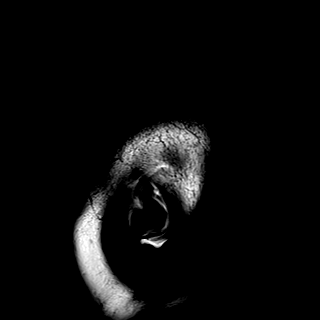

[Series 6: DWI · axial · 3.0mm · 1.44mm/px · z∈[-75,+92]mm · 6 of 88 slices shown (1 of 2)]
[im 1/88]
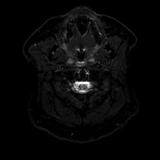
[im 18/88]
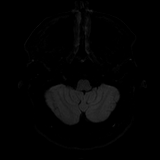
[im 35/88]
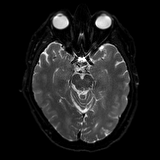
[im 53/88]
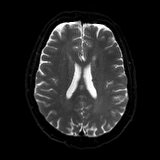
[im 70/88]
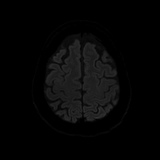
[im 88/88]
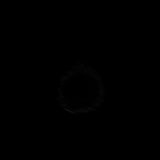

[Series 7: DWI · axial · 3.0mm · 1.44mm/px · z∈[-75,+92]mm · 3 of 43 slices shown (2 of 2)]
[im 1/43]
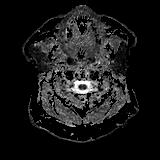
[im 22/43]
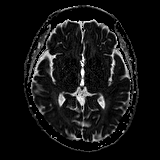
[im 43/43]
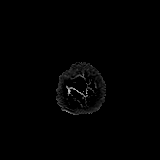

[Series 8: T2 · axial · 4.0mm · 0.36mm/px · z∈[-75,+91]mm · 2 of 33 slices shown]
[im 1/33]
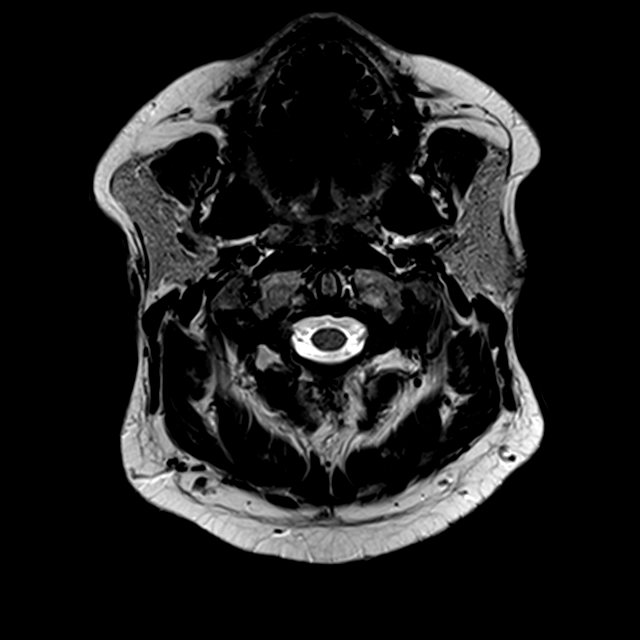
[im 33/33]
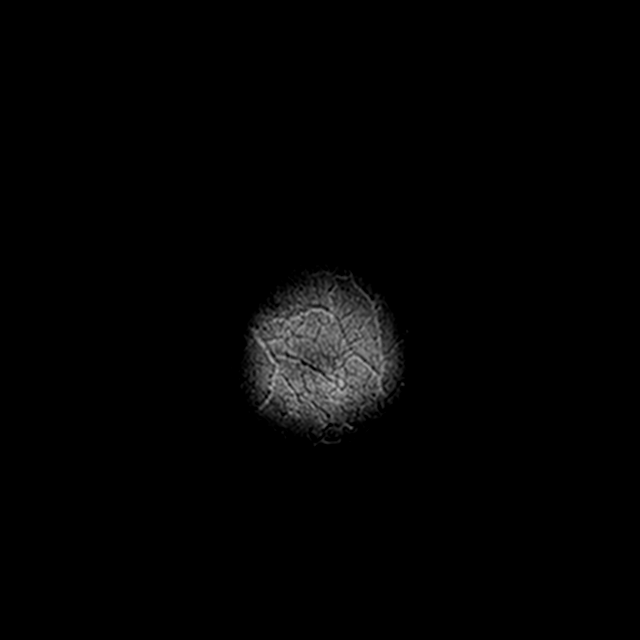

[Series 10: swi_images · axial · 1.5mm · 0.90mm/px · z∈[-74,+92]mm · 8 of 112 slices shown]
[im 1/112]
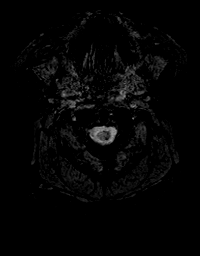
[im 16/112]
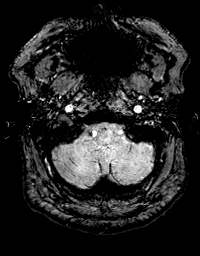
[im 32/112]
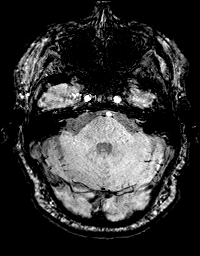
[im 48/112]
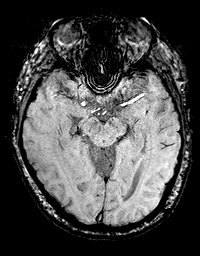
[im 64/112]
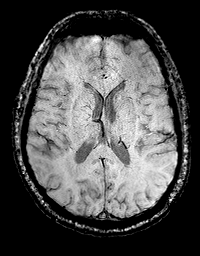
[im 80/112]
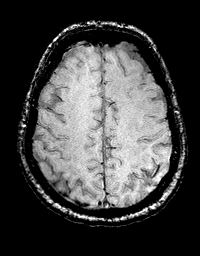
[im 96/112]
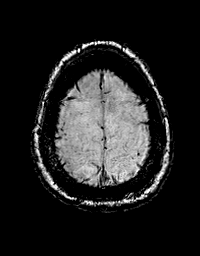
[im 112/112]
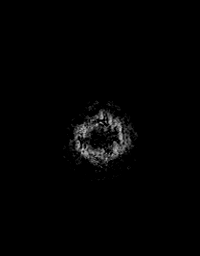

[Series 11: FLAIR · axial · 3.0mm · 0.72mm/px · z∈[-63,+80]mm · 2 of 25 slices shown]
[im 1/25]
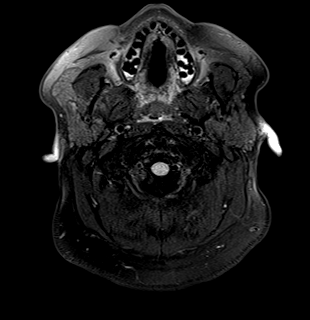
[im 25/25]
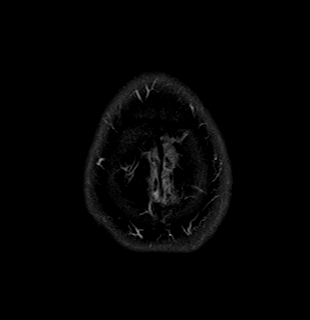

[Series 12: T1 · sagittal · 3.0mm · 0.42mm/px · 1 of 13 slices shown (2 of 5)]
[im 1/13]
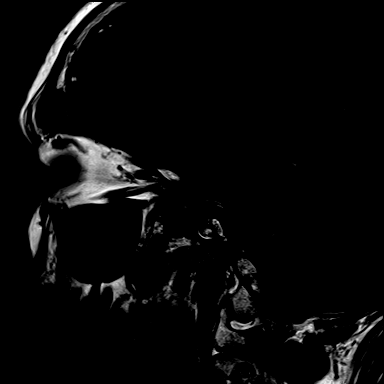

[Series 13: T1 · coronal · 3.0mm · 0.42mm/px · 1 of 10 slices shown (3 of 5)]
[im 1/10]
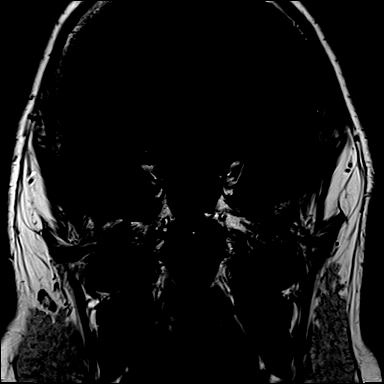

[Series 14: T1 · coronal · non-contrast · 3.0mm · 0.62mm/px · 1 of 9 slices shown (4 of 5)]
[im 1/9]
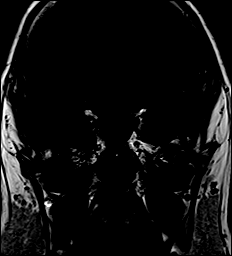

[Series 15: cor post dyn · coronal · 3.0mm · 0.62mm/px · 1 of 9 slices shown (1 of 6)]
[im 1/9]
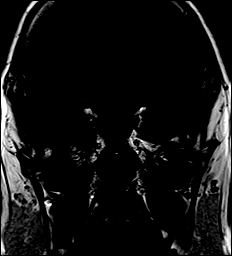

[Series 16: cor post dyn · coronal · 3.0mm · 0.62mm/px · 1 of 9 slices shown (2 of 6)]
[im 1/9]
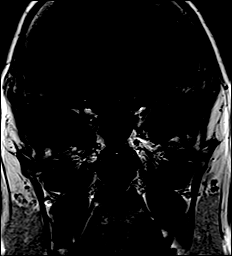

[Series 17: cor post dyn · coronal · 3.0mm · 0.62mm/px · 1 of 9 slices shown (3 of 6)]
[im 1/9]
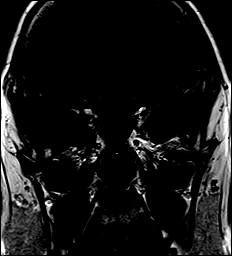

[Series 18: cor post dyn · coronal · 3.0mm · 0.62mm/px · 1 of 9 slices shown (4 of 6)]
[im 1/9]
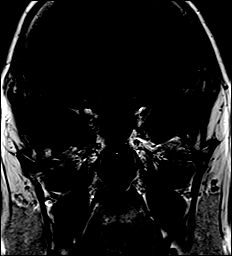

[Series 19: cor post dyn · coronal · 3.0mm · 0.62mm/px · 1 of 9 slices shown (5 of 6)]
[im 1/9]
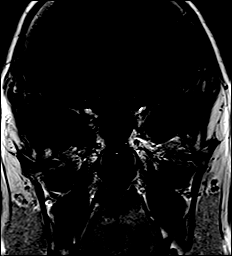

[Series 20: cor post dyn · coronal · 3.0mm · 0.62mm/px · 1 of 9 slices shown (6 of 6)]
[im 1/9]
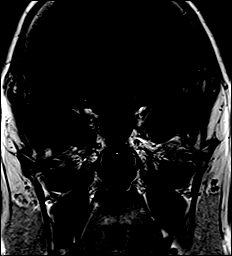

[Series 21: T1 post-contrast · sagittal · 3.0mm · 0.42mm/px · 1 of 13 slices shown (1 of 3)]
[im 1/13]
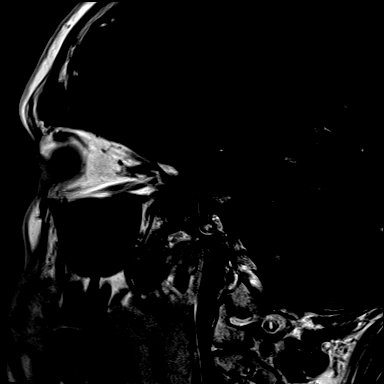

[Series 22: T1 post-contrast · coronal · 3.0mm · 0.42mm/px · 1 of 10 slices shown (2 of 3)]
[im 1/10]
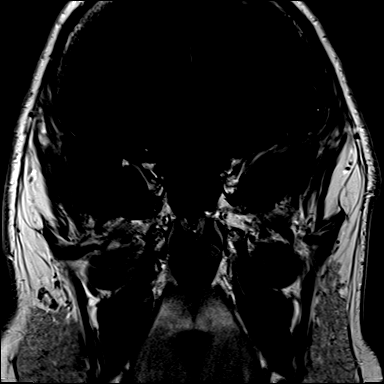

[Series 23: T1 · axial · 1.0mm · 0.86mm/px · z∈[-76,+83]mm · 12 of 160 slices shown (5 of 5)]
[im 1/160]
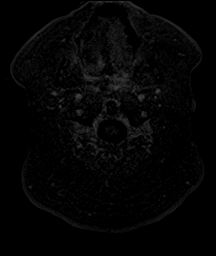
[im 15/160]
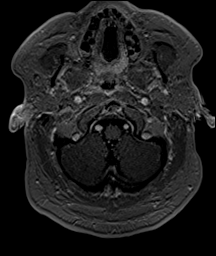
[im 29/160]
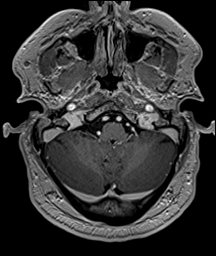
[im 44/160]
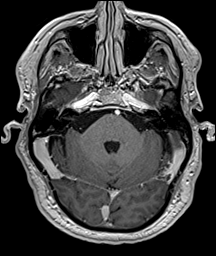
[im 58/160]
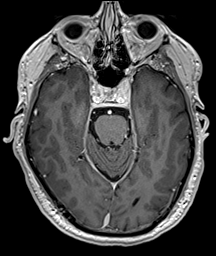
[im 73/160]
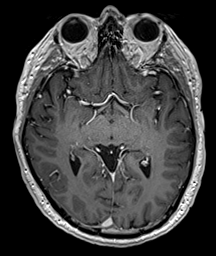
[im 87/160]
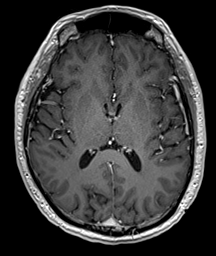
[im 102/160]
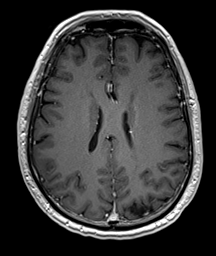
[im 116/160]
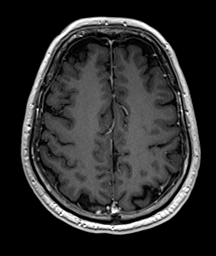
[im 131/160]
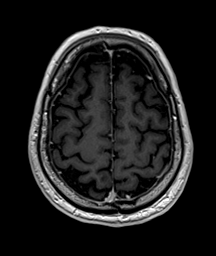
[im 145/160]
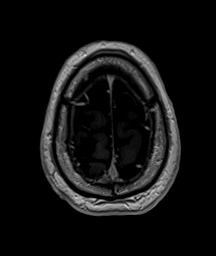
[im 160/160]
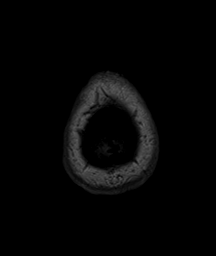

[Series 24: T1 post-contrast · coronal · 4.0mm · 0.47mm/px · 3 of 36 slices shown (3 of 3)]
[im 1/36]
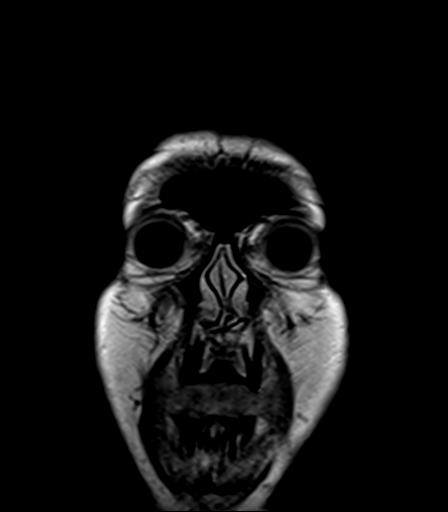
[im 18/36]
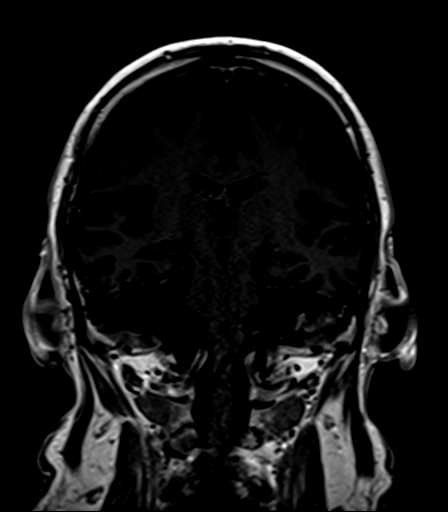
[im 36/36]
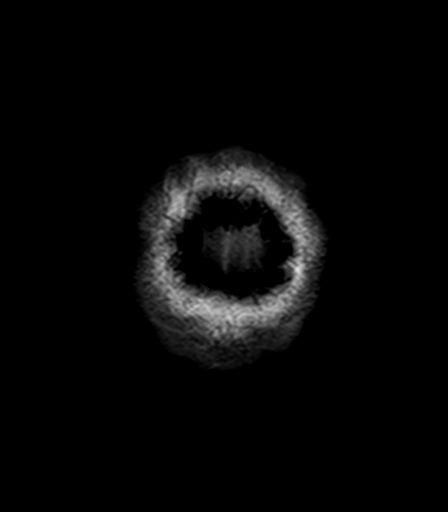

[48 of 48 positions shown; findings below may reference images not displayed]

FINDINGS: Brain: No acute infarction, hemorrhage, hydrocephalus or extra-axial
collection.

Hypoenhancing 7 mm lesion within the anterior pituitary gland to the
right of midline, unchanged from prior MRI. No extension into the
cavernous sinus or suprasellar cistern. Pituitary stalk is midline.

Otherwise, the brain parenchyma has normal morphology and signal
characteristics.

Vascular: Normal flow voids.

Skull and upper cervical spine: Normal marrow signal.

Sinuses/Orbits: Negative.

Other: None.
IMPRESSION: Stable 7 mm pituitary microadenoma to the right of midline.

## 2020-12-09 ENCOUNTER — Other Ambulatory Visit: Payer: Self-pay | Admitting: Family Medicine

## 2020-12-09 ENCOUNTER — Encounter: Payer: Self-pay | Admitting: Family Medicine

## 2020-12-09 ENCOUNTER — Ambulatory Visit (INDEPENDENT_AMBULATORY_CARE_PROVIDER_SITE_OTHER): Payer: Medicare Other | Admitting: Family Medicine

## 2020-12-09 ENCOUNTER — Other Ambulatory Visit: Payer: Self-pay

## 2020-12-09 VITALS — BP 120/80 | HR 73 | Temp 98.3°F | Resp 20 | Ht 74.0 in | Wt 278.6 lb

## 2020-12-09 DIAGNOSIS — R351 Nocturia: Secondary | ICD-10-CM | POA: Diagnosis not present

## 2020-12-09 DIAGNOSIS — E785 Hyperlipidemia, unspecified: Secondary | ICD-10-CM

## 2020-12-09 DIAGNOSIS — M25562 Pain in left knee: Secondary | ICD-10-CM

## 2020-12-09 DIAGNOSIS — K219 Gastro-esophageal reflux disease without esophagitis: Secondary | ICD-10-CM | POA: Diagnosis not present

## 2020-12-09 DIAGNOSIS — G8929 Other chronic pain: Secondary | ICD-10-CM

## 2020-12-09 DIAGNOSIS — Z0000 Encounter for general adult medical examination without abnormal findings: Secondary | ICD-10-CM | POA: Diagnosis not present

## 2020-12-09 DIAGNOSIS — R748 Abnormal levels of other serum enzymes: Secondary | ICD-10-CM

## 2020-12-09 DIAGNOSIS — I10 Essential (primary) hypertension: Secondary | ICD-10-CM | POA: Diagnosis not present

## 2020-12-09 DIAGNOSIS — D352 Benign neoplasm of pituitary gland: Secondary | ICD-10-CM

## 2020-12-09 DIAGNOSIS — M25561 Pain in right knee: Secondary | ICD-10-CM

## 2020-12-09 LAB — COMPREHENSIVE METABOLIC PANEL
ALT: 62 U/L — ABNORMAL HIGH (ref 0–53)
AST: 42 U/L — ABNORMAL HIGH (ref 0–37)
Albumin: 4.7 g/dL (ref 3.5–5.2)
Alkaline Phosphatase: 56 U/L (ref 39–117)
BUN: 12 mg/dL (ref 6–23)
CO2: 32 mEq/L (ref 19–32)
Calcium: 10.3 mg/dL (ref 8.4–10.5)
Chloride: 101 mEq/L (ref 96–112)
Creatinine, Ser: 0.97 mg/dL (ref 0.40–1.50)
GFR: 85.18 mL/min (ref >60.00)
Glucose, Bld: 97 mg/dL (ref 70–99)
Potassium: 4.6 mEq/L (ref 3.5–5.1)
Sodium: 140 mEq/L (ref 135–145)
Total Bilirubin: 0.6 mg/dL (ref 0.2–1.2)
Total Protein: 7.4 g/dL (ref 6.0–8.3)

## 2020-12-09 LAB — LIPID PANEL
Cholesterol: 118 mg/dL (ref 0–200)
HDL: 53.6 mg/dL (ref >39.00)
LDL Cholesterol: 51 mg/dL (ref 0–99)
NonHDL: 64.34
Total CHOL/HDL Ratio: 2
Triglycerides: 67 mg/dL (ref 0.0–149.0)
VLDL: 13.4 mg/dL (ref 0.0–40.0)

## 2020-12-09 LAB — PSA: PSA: 0.58 ng/mL (ref 0.10–4.00)

## 2020-12-09 MED ORDER — PANTOPRAZOLE SODIUM 40 MG PO TBEC
40.0000 mg | DELAYED_RELEASE_TABLET | Freq: Every day | ORAL | 3 refills | Status: DC
Start: 2020-12-09 — End: 2021-01-07

## 2020-12-09 MED ORDER — DICLOFENAC SODIUM 1 % EX GEL: 4.0000 g | Freq: Four times a day (QID) | CUTANEOUS | 2 refills | Status: DC

## 2020-12-09 NOTE — Assessment & Plan Note (Signed)
Worsening  Refer to GI

## 2020-12-09 NOTE — Assessment & Plan Note (Signed)
Per endo °

## 2020-12-09 NOTE — Patient Instructions (Signed)

## 2020-12-09 NOTE — Progress Notes (Signed)
Patient ID: Michael Pearson, male    DOB: [birth year]  Age: 60 y.o. MRN: [id number]    Subjective:  Subjective  HPI Michael Pearson presents for cpe and f/u with labs    Pt states he is eating healthier.  He c/o inc in gerd symptoms ----even with omeprazole -- he has a bad taste in his mouth   Review of Systems  Constitutional: Negative for fatigue, fever and unexpected weight change.  HENT: Negative for congestion.   Respiratory: Negative for cough and shortness of breath.   Cardiovascular: Negative for chest pain, palpitations and leg swelling.  Gastrointestinal: Negative for abdominal pain, blood in stool and nausea.  Genitourinary: Negative for dysuria and frequency.  Skin: Negative for rash.  Allergic/Immunologic: Negative for environmental allergies.  Neurological: Negative for dizziness and headaches.  Psychiatric/Behavioral: The patient is not nervous/anxious.     History Past Medical History:  Diagnosis Date  . Allergy   . Arthritis   . Asthma   . CAD (coronary artery disease)    NSTEMI 7/12:  Cardiac cath on 7/17: pLAD occluded, Dx 30-40%, pRCA 30-40%, mRCA 30-40%.  Proximal LAD was treated with a BMS.  Echo 7/19:  EF 60-65%, mild LVH.    ETT-Myoview 6/14:  Inf thinning, no ischemia, EF 64%, normal study // Myoview 04/2020: EF 57, normal perfusion; Low Risk    . Cognitive communication deficit   . Colitis   . Diverticulosis   . Duodenitis    May 2012 with heme positive stools at this time. Endorses only rare streaking of blood now.  . External hemorrhoids   . GERD (gastroesophageal reflux disease)   . Heart murmur    as a child per the pt  . Hiatal hernia   . Hx of hemorrhoids   . Hyperlipidemia   . Hypertension   . Kidney stones   . Microadenoma    MRI in 2008 demonstrating 4.6 mm area of pituitary gland   . Migraines    Has been evaluated multiple times in past for chronic headache in which pt has had occasional nose bleeds and bloodshot eyes. This lasted for 4-5  months. CT of the head was negative in May 2012.  . Myocardial infarction ([place]) 05/17/2011  . Pituitary tumor   . RBBB (right bundle branch block)    Noted on EKG in 2008  . Ulcer     He has a past surgical history that includes Hernia repair; Tooth extraction; Hammer toe surgery; egd (03/05/2011); Flexible sigmoidoscopy (03/05/2011); Heart stent; Upper gastrointestinal endoscopy; Colonoscopy; Sigmoidoscopy; and ORIF tibia plateau (Left, 08/04/2018).   His family history includes Cancer in his father; Colon cancer in his maternal aunt; Colon polyps in his maternal aunt and paternal grandmother; Coronary artery disease in his brother; Heart attack (age of onset: 34) in his father; Hypertension in his brother; Obesity in his brother; Skin cancer in his father; Stroke in his mother.He reports that he has never smoked. He has never used smokeless tobacco. He reports that he does not drink alcohol and does not use drugs.  Current Outpatient Medications on File Prior to Visit  Medication Sig Dispense Refill  . acetaminophen (TYLENOL) 500 MG tablet Take 1 tablet (500 mg total) by mouth every 12 (twelve) hours. 60 tablet 0  . albuterol (PROVENTIL HFA;VENTOLIN HFA) 108 (90 Base) MCG/ACT inhaler Inhale 2 puffs into the lungs every 4 (four) hours as needed for wheezing or shortness of breath. 1 Inhaler 2  . aspirin 81 MG  tablet Take 81 mg by mouth daily.    Marland Kitchen atorvastatin (LIPITOR) 40 MG tablet Take 1 tablet (40 mg total) by mouth daily. 90 tablet 0  . cetirizine (ZYRTEC) 10 MG tablet Take 10 mg by mouth daily as needed for allergies.     Marland Kitchen docusate sodium (COLACE) 100 MG capsule Take 1 capsule (100 mg total) by mouth 2 (two) times daily. 20 capsule 0  . ezetimibe (ZETIA) 10 MG tablet Take 1 tablet (10 mg total) by mouth daily. 90 tablet 0  . HYDROcodone-acetaminophen (NORCO) 7.5-325 MG tablet Take 1-2 tablets by mouth every 6 (six) hours as needed for moderate pain or severe pain. 50 tablet 0  . losartan  (COZAAR) 50 MG tablet Take 1 tablet (50 mg total) by mouth daily. 90 tablet 0  . methocarbamol (ROBAXIN) 500 MG tablet Take 1 tablet (500 mg total) by mouth every 8 (eight) hours as needed for muscle spasms. 60 tablet 0  . mometasone (NASONEX) 50 MCG/ACT nasal spray Place 2 sprays into the nose daily. 17 g 2  . Multiple Vitamins-Minerals (MENS ONE DAILY PO) Take 1 tablet by mouth daily.    Marland Kitchen triamcinolone cream (KENALOG) 0.1 % Apply 1 application topically 2 (two) times daily. 30 g 2  . valACYclovir (VALTREX) 1000 MG tablet Take 1 tablet (1,000 mg total) by mouth 3 (three) times daily. 30 tablet 0   No current facility-administered medications on file prior to visit.     Objective:  Objective  Physical Exam Vitals and nursing note reviewed.  Constitutional:      General: He is not in acute distress.    Appearance: He is well-developed and well-nourished. He is not diaphoretic.  HENT:     Head: Normocephalic and atraumatic.     Right Ear: External ear normal.     Left Ear: External ear normal.     Nose: Nose normal.     Mouth/Throat:     Mouth: Oropharynx is clear and moist.     Pharynx: No oropharyngeal exudate.  Eyes:     General:        Right eye: No discharge.        Left eye: No discharge.     Extraocular Movements: EOM normal.     Conjunctiva/sclera: Conjunctivae normal.     Pupils: Pupils are equal, round, and reactive to light.  Neck:     Thyroid: No thyromegaly.     Vascular: No JVD.  Cardiovascular:     Rate and Rhythm: Normal rate and regular rhythm.     Pulses: Intact distal pulses.     Heart sounds: No murmur heard.   Pulmonary:     Effort: Pulmonary effort is normal. No respiratory distress.     Breath sounds: Normal breath sounds. No wheezing or rales.  Chest:     Chest wall: No tenderness.  Abdominal:     General: Bowel sounds are normal. There is no distension.     Palpations: Abdomen is soft. There is no mass.     Tenderness: There is no abdominal  tenderness. There is no guarding or rebound.  Musculoskeletal:        General: No tenderness or edema. Normal range of motion.     Cervical back: Normal range of motion and neck supple.  Lymphadenopathy:     Cervical: No cervical adenopathy.  Skin:    General: Skin is warm and dry.     Findings: No erythema or rash.  Neurological:  Mental Status: He is alert and oriented to person, place, and time.     Cranial Nerves: No cranial nerve deficit.     Motor: No abnormal muscle tone.     Deep Tendon Reflexes: Reflexes are normal and symmetric. Reflexes normal.  Psychiatric:        Mood and Affect: Mood and affect normal.        Behavior: Behavior normal.        Thought Content: Thought content normal.        Judgment: Judgment normal.    BP 120/80 (BP Location: Right Arm, Patient Position: Sitting, Cuff Size: Large)   Pulse 73   Temp 98.3 F (36.8 C) (Oral)   Resp 20   Ht 6\' 2"  (1.88 m)   Wt 278 lb 9.6 oz (126.4 kg)   SpO2 95%   BMI 35.77 kg/m  Wt Readings from Last 3 Encounters:  12/09/20 278 lb 9.6 oz (126.4 kg)  08/15/20 265 lb 6.4 oz (120.4 kg)  07/31/20 261 lb 12.8 oz (118.8 kg)     Lab Results  Component Value Date   WBC 6.1 03/03/2020   HGB 15.0 03/03/2020   HCT 45.0 03/03/2020   PLT 171.0 03/03/2020   GLUCOSE 142 (H) 08/15/2020   CHOL 92 05/16/2020   TRIG 47.0 05/16/2020   HDL 47.10 05/16/2020   LDLCALC 36 05/16/2020   ALT 37 05/16/2020   AST 24 05/16/2020   NA 138 08/15/2020   K 4.1 08/15/2020   CL 103 08/15/2020   CREATININE 1.04 08/15/2020   BUN 14 08/15/2020   CO2 29 08/15/2020   TSH 1.60 07/16/2020   PSA 1.09 05/29/2019   INR 0.97 05/17/2011   HGBA1C 5.8 09/17/2014   MICROALBUR 1.5 11/20/2015    MR Brain W Wo Contrast  Result Date: 09/14/2020 CLINICAL DATA:  Brain/CNS neoplasm, surveillance (pituitary adenoma). EXAM: MRI HEAD WITHOUT AND WITH CONTRAST TECHNIQUE: Multiplanar, multiecho pulse sequences of the brain and surrounding structures  were obtained without and with intravenous contrast. CONTRAST:  31mL MULTIHANCE GADOBENATE DIMEGLUMINE 529 MG/ML IV SOLN COMPARISON:  MRI of the brain June 14, 2019. FINDINGS: Brain: No acute infarction, hemorrhage, hydrocephalus or extra-axial collection. Hypoenhancing 7 mm lesion within the anterior pituitary gland to the right of midline, unchanged from prior MRI. No extension into the cavernous sinus or suprasellar cistern. Pituitary stalk is midline. Otherwise, the brain parenchyma has normal morphology and signal characteristics. Vascular: Normal flow voids. Skull and upper cervical spine: Normal marrow signal. Sinuses/Orbits: Negative. Other: None. IMPRESSION: Stable 7 mm pituitary microadenoma to the right of midline. Electronically Signed   By: Pedro Earls M.D.   On: 09/14/2020 18:58     Assessment & Plan:  Plan  I have discontinued Rodrigo W. Meller's omeprazole, bromocriptine, and metoprolol tartrate. I am also having him start on pantoprazole and diclofenac Sodium. Additionally, I am having him maintain his aspirin, cetirizine, mometasone, albuterol, Multiple Vitamins-Minerals (MENS ONE DAILY PO), acetaminophen, HYDROcodone-acetaminophen, docusate sodium, methocarbamol, valACYclovir, triamcinolone, ezetimibe, atorvastatin, and losartan.  Meds ordered this encounter  Medications  . pantoprazole (PROTONIX) 40 MG tablet    Sig: Take 1 tablet (40 mg total) by mouth daily.    Dispense:  30 tablet    Refill:  3  . diclofenac Sodium (VOLTAREN) 1 % GEL    Sig: Apply 4 g topically 4 (four) times daily.    Dispense:  100 g    Refill:  2    Problem List Items Addressed  This Visit      Unprioritized   Chronic pain of both knees    voltaren gel  F/u ortho       Relevant Medications   diclofenac Sodium (VOLTAREN) 1 % GEL   Other Relevant Orders   Ambulatory referral to Orthopedic Surgery   GERD (gastroesophageal reflux disease)    Worsening  Refer to GI      Relevant  Medications   pantoprazole (PROTONIX) 40 MG tablet   Other Relevant Orders   Ambulatory referral to Gastroenterology   Hyperlipidemia   Relevant Orders   Lipid panel   Comprehensive metabolic panel   Microalbumin / creatinine urine ratio   Hypertension    Well controlled, no changes to meds. Encouraged heart healthy diet such as the DASH diet and exercise as tolerated.       Relevant Orders   Lipid panel   Comprehensive metabolic panel   Microalbumin / creatinine urine ratio   Pituitary adenoma (HCC)    Per endo       Preventative health care - Primary    ghm utd Check labs  See avs       Other Visit Diagnoses    Nocturia       Relevant Orders   PSA      Follow-up: Return in about 6 months (around 06/08/2021), or if symptoms worsen or fail to improve, for hypertension, hyperlipidemia.  [name], DO

## 2020-12-09 NOTE — Assessment & Plan Note (Signed)
ghm utd Check labs  See avs  

## 2020-12-09 NOTE — Assessment & Plan Note (Signed)
voltaren gel  F/u ortho

## 2020-12-09 NOTE — Assessment & Plan Note (Signed)
Well controlled, no changes to meds. Encouraged heart healthy diet such as the DASH diet and exercise as tolerated.  °

## 2020-12-10 LAB — MICROALBUMIN / CREATININE URINE RATIO
Creatinine,U: 164.8 mg/dL
Microalb Creat Ratio: 1.2 mg/g (ref 0.0–30.0)
Microalb, Ur: 2 mg/dL — ABNORMAL HIGH (ref 0.0–1.9)

## 2021-01-06 NOTE — Progress Notes (Signed)
01/06/2021 Michael Pearson [id number] 22-Nov-1960    Chief Complaint:  GERD   History of Present Illness: Michael Pearson is a 60 year old male with a past medical history of asthma hypertension, coronary artery disease, NSTEMI s/p LAD stent 2012, RBBB, hyperlipidemia, pituitary microadenoma, diverticulitis with segmental colitis per colonoscopy 2014 and 2019 and  infectious verses ischemic colitis per flex sigmoidoscopy 03/2011 and GERD.   He is followed by Dr. Hilarie Fredrickson. He was last seen in our office by Ellouise Newer PA-C on 06/14/2018 for further evaluation for vomiting, lower abdominal pain and intermittent days with numerous BMs. His GERD symptoms were stable at that time on Omeprazole 40mg  QD. He underwent a colonoscopy 06/26/2018 which showed evidence of segmental colitis to the left colon with diverticulosis which was treated with Lialda. Father with history of "cancerous colon polyps".  Maternal aunt with history of colon cancer.  He presents to our office today for further evaluation for GERD symptoms. He complains of having worsening acid reflux for the past 4 months with associated dysphagia. He stated his acid reflux feels like a blow torch in his esophagus and he has a foul taste in his mouth which is worse than rotten eggs. He reports food gets stuck to the upper esophagus on a daily basis for the past 4 months. He drinks 2 to 3 glasses of fluids and the stuck food passes down the esophagus. He vomited acid like secretions on one occasion. He also recalled vomiting a few times when he bent forward to pick something off of the ground. His emesis was darker  red in color on a few occasions last week but he is not sure if it was blood. He is on Pantoprazole 40mg  QD without improvement. He used Omeprazole in the past which was ineffective. He underwent an EGD 02/17/2016 which showed a 1 to 2 cm hiatal hernia, is esophagus was dilated due to having dysphagia, normal stomach and duodenum. He is passing  a normal brown formed BM daily, some days he passes more than 10 solid BMs in a day. No diarrhea. No rectal bleeding or melena. He takes ASA 81mg  daily. Topical Voltaren, no other NSAID use.  He has a history of mildly elevated LFTs 05/2018 - 1/20220. An abdominal sonogram 11/22/2018 showed  diffuse hepatocellular disease. Negative Hep C ab 2017. He is on Atorvastatin 40mg  QD. Rare alcohol use.   During his office visit today he was SOB which he initially stated was due to wearing a mask. He received his Covid vaccinations x 2 and Booster. He took his mask  off and 15 minutes later he remained mildly SOB. When laying supine during his exam,  his SOB was more pronounced but he was not in any acute distress. His SOB was somewhat more pronounced when laying supine with upper airway gurgling sounds. His lungs were clear, a bit diminished without wheezing or crackles. No cough. No chest pain. He has a history of asthma, occasionally uses Albuterol. No known history of sleep apnea. He has gained 19lbs over the past 5 to 6 months.   CBC Latest Ref Rng & Units 03/03/2020 08/06/2018 08/05/2018  WBC 4.0 - 10.5 K/uL 6.1 6.6 8.3  Hemoglobin 13.0 - 17.0 g/dL 15.0 12.0(L) 12.0(L)  Hematocrit 39.0 - 52.0 % 45.0 37.2(L) 36.5(L)  Platelets 150.0 - 400.0 K/uL 171.0 174 172    CMP Latest Ref Rng & Units 12/09/2020 08/15/2020 05/16/2020  Glucose 70 - 99 mg/dL 97 142(H) 97  BUN 6 - 23 mg/dL 12 14 13   Creatinine 0.40 - 1.50 mg/dL 0.97 1.04 0.91  Sodium 135 - 145 mEq/L 140 138 138  Potassium 3.5 - 5.1 mEq/L 4.6 4.1 3.8  Chloride 96 - 112 mEq/L 101 103 104  CO2 19 - 32 mEq/L 32 29 28  Calcium 8.4 - 10.5 mg/dL 10.3 9.4 9.4  Total Protein 6.0 - 8.3 g/dL 7.4 - 6.6  Total Bilirubin 0.2 - 1.2 mg/dL 0.6 - 0.6  Alkaline Phos 39 - 117 U/L 56 - 54  AST 0 - 37 U/L 42(H) - 24  ALT 0 - 53 U/L 62(H) - 37   Abdominal sonogram 11/22/2018: Gallbladder: No gallstones or wall thickening visualized. No sonographic Murphy sign noted by  sonographer. Common bile duct: Diameter: 3 mm, within normal limits. Liver: Diffusely increased and heterogeneous echotexture, consistent with diffuse hepatocellular disease. No liver mass identified, although visualization of the hepatic parenchyma is limited by lack of acoustic penetration from hepatocellular disease. Portal vein is patent on color Doppler imaging with normal direction of blood flow towards the liver. IVC: No abnormality visualized. Pancreas: Visualized portion unremarkable. Spleen: Size and appearance within normal limits. Right Kidney: Length: 12.4 cm. Echogenicity within normal limits. No mass or hydronephrosis visualized. Left Kidney: Length: 13.2 cm. Echogenicity within normal limits. No mass or hydronephrosis visualized. Abdominal aorta: No aneurysm visualized. Other findings: None. IMPRESSION: Diffuse hepatocellular disease. Consider hepatic elastography ultrasound for further evaluation. Otherwise unremarkable exam.  EGD 02/17/2016: - No endoscopic esophageal abnormality to explain patient's dysphagia. Z-line regular at 38 cm without evidence of esophagitis. Esophagus dilated to 18 mm with Savary over guidewire. - 1-2 cm hiatal hernia. - Normal stomach. - Nodular mucosa in the duodenal bulb. Biopsied. - Normal second portion of the duodenum.  Colonoscopy 06/26/2018:  - The examined portion of the ileum was normal. - Mild diverticulosis in the sigmoid colon and in the distal descending colon. Erythema was seen in association with the diverticular opening. - External hemorrhoids. - Biopsies were taken with a cold forceps for histology in the sigmoid colon, in the distal descending colon, in the transverse colon and in the ascending colon Biopsies: 1. Surgical [P], right colon - COLONIC MUCOSA WITH NO SPECIFIC HISTOPATHOLOGIC CHANGES. 2. Surgical [P], left colon - COLONIC MUCOSA WITH MILD ARCHITECTURAL CHANGES. - NEGATIVE FOR ACUTE OR  CHRONIC INFLAMMATION, GRANULOMAS OR DYSPLASIA. - SEE NOTE. 2. The findings are not diagnostic but can be compatible with mild diverticulosis-associated colitis in the appropriate clinical setting  Past Medical History:  Diagnosis Date  . Allergy   . Arthritis   . Asthma   . CAD (coronary artery disease)    NSTEMI 7/12:  Cardiac cath on 7/17: pLAD occluded, Dx 30-40%, pRCA 30-40%, mRCA 30-40%.  Proximal LAD was treated with a BMS.  Echo 7/19:  EF 60-65%, mild LVH.    ETT-Myoview 6/14:  Inf thinning, no ischemia, EF 64%, normal study // Myoview 04/2020: EF 57, normal perfusion; Low Risk    . Cognitive communication deficit   . Colitis   . Diverticulosis   . Duodenitis    May 2012 with heme positive stools at this time. Endorses only rare streaking of blood now.  . External hemorrhoids   . GERD (gastroesophageal reflux disease)   . Heart murmur    as a child per the pt  . Hiatal hernia   . Hx of hemorrhoids   . Hyperlipidemia   . Hypertension   . Kidney stones   .  Microadenoma    MRI in 2008 demonstrating 4.6 mm area of pituitary gland   . Migraines    Has been evaluated multiple times in past for chronic headache in which pt has had occasional nose bleeds and bloodshot eyes. This lasted for 4-5 months. CT of the head was negative in May 2012.  . Myocardial infarction ([place]) 05/17/2011  . Pituitary tumor   . RBBB (right bundle branch block)    Noted on EKG in 2008  . Ulcer    Past Surgical History:  Procedure Laterality Date  . COLONOSCOPY    . egd  03/05/2011   Dr. Benson [place]  . FLEXIBLE SIGMOIDOSCOPY  03/05/2011   Dr. Benson [place]  . HAMMER TOE SURGERY    . Heart stent    . HERNIA REPAIR     In 20's  . ORIF TIBIA PLATEAU Left 08/04/2018   Procedure: OPEN REDUCTION INTERNAL FIXATION (ORIF) TIBIAL PLATEAU;  Surgeon: Altamese Cheney, MD;  Location: [facility];  Service: Orthopedics;  Laterality: Left;  . SIGMOIDOSCOPY    . TOOTH EXTRACTION    . UPPER GASTROINTESTINAL ENDOSCOPY     Current  Outpatient Medications on File Prior to Visit  Medication Sig Dispense Refill  . acetaminophen (TYLENOL) 500 MG tablet Take 1 tablet (500 mg total) by mouth every 12 (twelve) hours. 60 tablet 0  . albuterol (PROVENTIL HFA;VENTOLIN HFA) 108 (90 Base) MCG/ACT inhaler Inhale 2 puffs into the lungs every 4 (four) hours as needed for wheezing or shortness of breath. 1 Inhaler 2  . aspirin 81 MG tablet Take 81 mg by mouth daily.    Marland Kitchen atorvastatin (LIPITOR) 40 MG tablet Take 1 tablet (40 mg total) by mouth daily. 90 tablet 0  . cetirizine (ZYRTEC) 10 MG tablet Take 10 mg by mouth daily as needed for allergies.     Marland Kitchen diclofenac Sodium (VOLTAREN) 1 % GEL Apply 4 g topically 4 (four) times daily. 100 g 2  . docusate sodium (COLACE) 100 MG capsule Take 1 capsule (100 mg total) by mouth 2 (two) times daily. 20 capsule 0  . ezetimibe (ZETIA) 10 MG tablet Take 1 tablet (10 mg total) by mouth daily. 90 tablet 0  . HYDROcodone-acetaminophen (NORCO) 7.5-325 MG tablet Take 1-2 tablets by mouth every 6 (six) hours as needed for moderate pain or severe pain. 50 tablet 0  . losartan (COZAAR) 50 MG tablet Take 1 tablet (50 mg total) by mouth daily. 90 tablet 0  . methocarbamol (ROBAXIN) 500 MG tablet Take 1 tablet (500 mg total) by mouth every 8 (eight) hours as needed for muscle spasms. 60 tablet 0  . mometasone (NASONEX) 50 MCG/ACT nasal spray Place 2 sprays into the nose daily. 17 g 2  . Multiple Vitamins-Minerals (MENS ONE DAILY PO) Take 1 tablet by mouth daily.    Marland Kitchen triamcinolone cream (KENALOG) 0.1 % Apply 1 application topically 2 (two) times daily. 30 g 2  . valACYclovir (VALTREX) 1000 MG tablet Take 1 tablet (1,000 mg total) by mouth 3 (three) times daily. 30 tablet 0   No current facility-administered medications on file prior to visit.   No Known Allergies  Current Medications, Allergies, Past Medical History, Past Surgical History, Family History and Social History were reviewed in [employer] record.   Review of Systems:   Constitutional: Negative for fever, sweats, chills or weight loss.  Respiratory: See HPI.  Cardiovascular: Negative for chest pain, palpitations and leg swelling.  Gastrointestinal: See HPI.  Musculoskeletal: Negative  for back pain or muscle aches.  Neurological: Negative for dizziness, headaches or paresthesias.    Physical Exam: BP 118/64   Pulse 70   Ht 6\' 2"  (1.88 m)   Wt 284 lb (128.8 kg)   BMI 36.46 kg/m    Wt Readings from Last 3 Encounters:  01/07/21 284 lb (128.8 kg)  12/09/20 278 lb 9.6 oz (126.4 kg)  08/15/20 265 lb 6.4 oz (120.4 kg)    General: 60 year old tall obese male in no acute distress. Head: Normocephalic and atraumatic. Eyes: No scleral icterus. Conjunctiva pink . Ears: Normal auditory acuity. Mouth: Dentition intact. No ulcers or lesions. Mallampati class II.  Lungs: Clear throughout to auscultation. Heart: Regular rate and rhythm, no murmur. Abdomen: Soft, nontender and nondistended. No masses or hepatomegaly. Normal bowel sounds x 4 quadrants.  Rectal: Deferred. Musculoskeletal: Symmetrical with no gross deformities. Extremities: Trace LE edema.  Neurological: Alert oriented x 4. No focal deficits.  Psychological: Alert and cooperative. Normal mood and affect  Assessment and Recommendations: 23. 60 year old male with GERD and dysphagia. Questionable hematemesis.  -Stop Pantoprazole. -Esomeprazole 40mg  po bid  -EGD with possible esophageal dilatation benefits and risks discussed including risk with sedation, risk of bleeding, perforation and infection  -Pulmonary evaluation asap prior to scheduling EGD -Cardiology follow up recommended if pulmonary evaluation unrevealing  -Patient to call our office if symptoms worsen   2. Mildly elevated LFTs. Abdominal sonogram 11/2018 showed diffusely increased and heterogeneous echotexture, consistent with diffuse hepatocellular disease -Hepatic panel, Hep A  total ab, Hep B surface antigen, Hep B core total ab, Hep  C antibody -Continue Atorvastatin  3. Asthma. SOB with/without wearing a mask, worse when supine. Possible OSA component.  -Pulmonary evaluation asap, required prior to sedating patient for EGD  4. Family history of colon cancer. Patient reports father had cancerous colon polyps, he did not require surgery, chemo or radiation. Maternal aunt with history of colon cancer. Last colonoscopy in 2019, no polyps. -Dr. Hilarie Fredrickson to verify colonoscopy recall date  74. History of diverticulosis with segmental colitis. No diarrhea. Intermittent days with 10+ solid nonbloody BMs without abdominal pain/cramping.   6. History of CAD, s/p MI with metal stent placement 2012. ECHO 01/2018 with LV EF 55- 60%. -May need repeat ECHO

## 2021-01-07 ENCOUNTER — Other Ambulatory Visit (INDEPENDENT_AMBULATORY_CARE_PROVIDER_SITE_OTHER): Payer: Medicare Other

## 2021-01-07 ENCOUNTER — Other Ambulatory Visit: Payer: Self-pay

## 2021-01-07 ENCOUNTER — Other Ambulatory Visit: Payer: Self-pay | Admitting: Family Medicine

## 2021-01-07 ENCOUNTER — Ambulatory Visit (INDEPENDENT_AMBULATORY_CARE_PROVIDER_SITE_OTHER): Payer: Medicare Other | Admitting: Nurse Practitioner

## 2021-01-07 ENCOUNTER — Telehealth: Payer: Self-pay | Admitting: Family Medicine

## 2021-01-07 ENCOUNTER — Encounter: Payer: Self-pay | Admitting: Nurse Practitioner

## 2021-01-07 VITALS — BP 118/64 | HR 70 | Ht 74.0 in | Wt 284.0 lb

## 2021-01-07 DIAGNOSIS — R7989 Other specified abnormal findings of blood chemistry: Secondary | ICD-10-CM | POA: Diagnosis not present

## 2021-01-07 DIAGNOSIS — R748 Abnormal levels of other serum enzymes: Secondary | ICD-10-CM

## 2021-01-07 DIAGNOSIS — K219 Gastro-esophageal reflux disease without esophagitis: Secondary | ICD-10-CM | POA: Diagnosis not present

## 2021-01-07 DIAGNOSIS — R1310 Dysphagia, unspecified: Secondary | ICD-10-CM

## 2021-01-07 DIAGNOSIS — J4520 Mild intermittent asthma, uncomplicated: Secondary | ICD-10-CM

## 2021-01-07 LAB — CBC
HCT: 43.5 % (ref 39.0–52.0)
Hemoglobin: 14.6 g/dL (ref 13.0–17.0)
MCHC: 33.7 g/dL (ref 30.0–36.0)
MCV: 95.7 fl (ref 78.0–100.0)
Platelets: 194 10*3/uL (ref 150.0–400.0)
RBC: 4.54 Mil/uL (ref 4.22–5.81)
RDW: 13.9 % (ref 11.5–15.5)
WBC: 5.7 10*3/uL (ref 4.0–10.5)

## 2021-01-07 LAB — COMPREHENSIVE METABOLIC PANEL
ALT: 52 U/L (ref 0–53)
AST: 36 U/L (ref 0–37)
Albumin: 4.3 g/dL (ref 3.5–5.2)
Alkaline Phosphatase: 48 U/L (ref 39–117)
BUN: 12 mg/dL (ref 6–23)
CO2: 28 mEq/L (ref 19–32)
Calcium: 9.5 mg/dL (ref 8.4–10.5)
Chloride: 102 mEq/L (ref 96–112)
Creatinine, Ser: 0.83 mg/dL (ref 0.40–1.50)
GFR: 95.45 mL/min (ref >60.00)
Glucose, Bld: 114 mg/dL — ABNORMAL HIGH (ref 70–99)
Potassium: 4.1 mEq/L (ref 3.5–5.1)
Sodium: 139 mEq/L (ref 135–145)
Total Bilirubin: 0.8 mg/dL (ref 0.2–1.2)
Total Protein: 6.8 g/dL (ref 6.0–8.3)

## 2021-01-07 LAB — HEPATIC FUNCTION PANEL
ALT: 53 U/L (ref 0–53)
AST: 38 U/L — ABNORMAL HIGH (ref 0–37)
Albumin: 4.2 g/dL (ref 3.5–5.2)
Alkaline Phosphatase: 48 U/L (ref 39–117)
Bilirubin, Direct: 0.2 mg/dL (ref 0.0–0.3)
Total Bilirubin: 0.7 mg/dL (ref 0.2–1.2)
Total Protein: 7 g/dL (ref 6.0–8.3)

## 2021-01-07 MED ORDER — ESOMEPRAZOLE MAGNESIUM 40 MG PO CPDR: 40.0000 mg | DELAYED_RELEASE_CAPSULE | Freq: Two times a day (BID) | ORAL | 1 refills | Status: DC

## 2021-01-07 NOTE — Patient Instructions (Signed)
If you are age 60 or younger, your body mass index should be between 19-25. Your Body mass index is 36.46 kg/m. If this is out of the aformentioned range listed, please consider follow up with your Primary Care Provider.   Please contact your primary care provider to discuss pulmonary clearance prior to [place] scheduling your procedure.  Stop taking the Pantoprazole.  Start Esomeprazole 40 MG twice a day. We have sent this in to your pharmacy.  LABS:  Lab work has been ordered for you today. Our lab is located in the basement. Press "B" on the elevator. The lab is located at the first door on the left as you exit the elevator.  HEALTHCARE LAWS AND MY CHART RESULTS: Due to recent changes in healthcare laws, you may see the results of your imaging and laboratory studies on MyChart before your provider has had a chance to review them.   We understand that in some cases there may be results that are confusing or concerning to you. Not all laboratory results come back in the same time frame and the provider may be waiting for multiple results in order to interpret others.  Please give [place] 48 hours in order for your provider to thoroughly review all the results before contacting the office for clarification of your results.   It was great seeing you today! Thank you for entrusting me with your care and choosing [facility].  Noralyn Pick, CRNP   Gastroesophageal Reflux Disease, Adult  Gastroesophageal reflux (GER) happens when acid from the stomach flows up into the tube that connects the mouth and the stomach (esophagus). Normally, food travels down the esophagus and stays in the stomach to be digested. With GER, food and stomach acid sometimes move back up into the esophagus. You may have a disease called gastroesophageal reflux disease (GERD) if the reflux:  Happens often.  Causes frequent or very bad symptoms.  Causes problems such as damage to the esophagus. When this  happens, the esophagus becomes sore and swollen. Over time, GERD can make small holes (ulcers) in the lining of the esophagus. What are the causes? This condition is caused by a problem with the muscle between the esophagus and the stomach. When this muscle is weak or not normal, it does not close properly to keep food and acid from coming back up from the stomach. The muscle can be weak because of:  Tobacco use.  Pregnancy.  Having a certain type of hernia (hiatal hernia).  Alcohol use.  Certain foods and drinks, such as coffee, chocolate, onions, and peppermint. What increases the risk?  Being overweight.  Having a disease that affects your connective tissue.  Taking NSAIDs, such a ibuprofen. What are the signs or symptoms?  Heartburn.  Difficult or painful swallowing.  The feeling of having a lump in the throat.  A bitter taste in the mouth.  Bad breath.  Having a lot of saliva.  Having an upset or bloated stomach.  Burping.  Chest pain. Different conditions can cause chest pain. Make sure you see your doctor if you have chest pain.  Shortness of breath or wheezing.  A long-term cough or a cough at night.  Wearing away of the surface of teeth (tooth enamel).  Weight loss. How is this treated?  Making changes to your diet.  Taking medicine.  Having surgery. Treatment will depend on how bad your symptoms are. Follow these instructions at home: Eating and drinking  Follow a diet as  told by your doctor. You may need to avoid foods and drinks such as: ? Coffee and tea, with or without caffeine. ? Drinks that contain alcohol. ? Energy drinks and sports drinks. ? Bubbly (carbonated) drinks or sodas. ? Chocolate and cocoa. ? Peppermint and mint flavorings. ? Garlic and onions. ? Horseradish. ? Spicy and acidic foods. These include peppers, chili powder, curry powder, vinegar, hot sauces, and BBQ sauce. ? Citrus fruit juices and citrus fruits, such as  oranges, lemons, and limes. ? Tomato-based foods. These include red sauce, chili, salsa, and pizza with red sauce. ? Fried and fatty foods. These include donuts, french fries, potato chips, and high-fat dressings. ? High-fat meats. These include hot dogs, rib eye steak, sausage, ham, and bacon. ? High-fat dairy items, such as whole milk, butter, and cream cheese.  Eat small meals often. Avoid eating large meals.  Avoid drinking large amounts of liquid with your meals.  Avoid eating meals during the 2-3 hours before bedtime.  Avoid lying down right after you eat.  Do not exercise right after you eat.   Lifestyle  Do not smoke or use any products that contain nicotine or tobacco. If you need help quitting, ask your doctor.  Try to lower your stress. If you need help doing this, ask your doctor.  If you are overweight, lose an amount of weight that is healthy for you. Ask your doctor about a safe weight loss goal.   General instructions  Pay attention to any changes in your symptoms.  Take over-the-counter and prescription medicines only as told by your doctor.  Do not take aspirin, ibuprofen, or other NSAIDs unless your doctor says it is okay.  Wear loose clothes. Do not wear anything tight around your waist.  Raise (elevate) the head of your bed about 6 inches (15 cm). You may need to use a wedge to do this.  Avoid bending over if this makes your symptoms worse.  Keep all follow-up visits. Contact a doctor if:  You have new symptoms.  You lose weight and you do not know why.  You have trouble swallowing or it hurts to swallow.  You have wheezing or a cough that keeps happening.  You have a hoarse voice.  Your symptoms do not get better with treatment. Get help right away if:  You have sudden pain in your arms, neck, jaw, teeth, or back.  You suddenly feel sweaty, dizzy, or light-headed.  You have chest pain or shortness of breath.  You vomit and the vomit is  green, yellow, or black, or it looks like blood or coffee grounds.  You faint.  Your poop (stool) is red, bloody, or black.  You cannot swallow, drink, or eat. These symptoms may represent a serious problem that is an emergency. Do not wait to see if the symptoms will go away. Get medical help right away. Call your local emergency services (911 in the [place] Do not drive yourself to the hospital. Summary  If a person has gastroesophageal reflux disease (GERD), food and stomach acid move back up into the esophagus and cause symptoms or problems such as damage to the esophagus.  Treatment will depend on how bad your symptoms are.  Follow a diet as told by your doctor.  Take all medicines only as told by your doctor. This information is not intended to replace advice given to you by your health care provider. Make sure you discuss any questions you have with your health care provider.  Document Revised: 04/28/2020 Document Reviewed: 04/28/2020 Elsevier Patient Education  [employer].

## 2021-01-07 NOTE — Telephone Encounter (Signed)
Caller: Michael Pearson  Call Back @ [phone]   Patient states he went to GI doctor this morning. GI referred him back to PCP for a referral for Pulmonary Clearance before procedure in GI . Patient is breathing hard due to his asthma.

## 2021-01-07 NOTE — Telephone Encounter (Signed)
Referral was placed earlier today

## 2021-01-07 NOTE — Telephone Encounter (Signed)
The OV note from GI said to  "Please contact your primary care provider to discuss pulmonary clearance prior to [place] scheduling your procedure."  Please advise on how to move forward.

## 2021-01-08 LAB — HEPATITIS B SURFACE ANTIBODY,QUALITATIVE: Hep B S Ab: NONREACTIVE

## 2021-01-08 LAB — HEPATITIS C ANTIBODY
Hepatitis C Ab: NONREACTIVE
SIGNAL TO CUT-OFF: 0 (ref <1.00)

## 2021-01-08 LAB — HEPATITIS A ANTIBODY, IGM: Hep A IgM: NONREACTIVE

## 2021-01-08 LAB — HEPATITIS B SURFACE ANTIGEN: Hepatitis B Surface Ag: NONREACTIVE

## 2021-01-08 NOTE — Progress Notes (Signed)
Colonoscopy recall placed for 8/24 with Dr Hilarie Fredrickson

## 2021-01-08 NOTE — Progress Notes (Signed)
Addendum: Reviewed and agree with assessment and management plan. 5-year recall colonoscopy recommended given family history of colon cancer Agree with recommendation for upcoming EGD Celisse Ciulla, Lajuan Lines, MD

## 2021-01-08 NOTE — Progress Notes (Signed)
Michael Pearson, please change patient colonoscopy recall to 06/2023 as verified by Dr. Hilarie Fredrickson.   Please contact the patient and if needed facilitate a pulmonary evaluation asap which I requested prior to scheduling an EGD. Refer to 3/9 office consult note. Thx.

## 2021-01-25 ENCOUNTER — Telehealth: Payer: Self-pay | Admitting: Nurse Practitioner

## 2021-01-25 NOTE — Telephone Encounter (Signed)
Beth, pls call the patient and verify when he is seeing pulmonologist. He needs EGD due to significant reflux and dysphagia symptoms but needs pulmonary evaluation prior to being sedated for EGD. Refer to 01/07/2021 office visit. Pls obtain symptom update from patient and let me know outcome. thx

## 2021-01-26 NOTE — Telephone Encounter (Signed)
Patient has been scheduled with Pulmonary for 02/25/21 9:00.  Patient notified of the appointment details.  He reports doing better "than I was"

## 2021-02-06 ENCOUNTER — Other Ambulatory Visit: Payer: Self-pay | Admitting: Family Medicine

## 2021-02-06 DIAGNOSIS — E785 Hyperlipidemia, unspecified: Secondary | ICD-10-CM

## 2021-02-06 DIAGNOSIS — I2510 Atherosclerotic heart disease of native coronary artery without angina pectoris: Secondary | ICD-10-CM

## 2021-02-06 DIAGNOSIS — I10 Essential (primary) hypertension: Secondary | ICD-10-CM

## 2021-02-12 ENCOUNTER — Other Ambulatory Visit: Payer: Self-pay

## 2021-02-12 ENCOUNTER — Telehealth: Payer: Self-pay | Admitting: Family Medicine

## 2021-02-12 DIAGNOSIS — I2510 Atherosclerotic heart disease of native coronary artery without angina pectoris: Secondary | ICD-10-CM

## 2021-02-12 DIAGNOSIS — E785 Hyperlipidemia, unspecified: Secondary | ICD-10-CM

## 2021-02-12 MED ORDER — ATORVASTATIN CALCIUM 40 MG PO TABS: 1.0000 | ORAL_TABLET | Freq: Every day | ORAL | 1 refills | Status: DC

## 2021-02-12 NOTE — Telephone Encounter (Signed)
Resent

## 2021-02-12 NOTE — Telephone Encounter (Signed)
Per patient [facility] has not received the medication

## 2021-02-12 NOTE — Telephone Encounter (Signed)
Please resend the medication to [facility]   atorvastatin (LIPITOR) 40 MG tablet

## 2021-02-25 ENCOUNTER — Ambulatory Visit (INDEPENDENT_AMBULATORY_CARE_PROVIDER_SITE_OTHER): Payer: Medicare Other | Admitting: Pulmonary Disease

## 2021-02-25 ENCOUNTER — Other Ambulatory Visit: Payer: Self-pay

## 2021-02-25 ENCOUNTER — Encounter: Payer: Self-pay | Admitting: Pulmonary Disease

## 2021-02-25 ENCOUNTER — Ambulatory Visit (INDEPENDENT_AMBULATORY_CARE_PROVIDER_SITE_OTHER): Payer: Medicare Other

## 2021-02-25 VITALS — BP 128/78 | HR 76 | Temp 97.0°F | Ht 74.0 in | Wt 282.4 lb

## 2021-02-25 DIAGNOSIS — R0602 Shortness of breath: Secondary | ICD-10-CM | POA: Diagnosis not present

## 2021-02-25 DIAGNOSIS — G4733 Obstructive sleep apnea (adult) (pediatric): Secondary | ICD-10-CM | POA: Diagnosis not present

## 2021-02-25 LAB — CBC WITH DIFFERENTIAL/PLATELET
Basophils Absolute: 0.1 10*3/uL (ref 0.0–0.1)
Basophils Relative: 0.9 % (ref 0.0–3.0)
Eosinophils Absolute: 0.3 10*3/uL (ref 0.0–0.7)
Eosinophils Relative: 4.8 % (ref 0.0–5.0)
HCT: 45 % (ref 39.0–52.0)
Hemoglobin: 15.1 g/dL (ref 13.0–17.0)
Lymphocytes Relative: 32.1 % (ref 12.0–46.0)
Lymphs Abs: 2 10*3/uL (ref 0.7–4.0)
MCHC: 33.5 g/dL (ref 30.0–36.0)
MCV: 95.8 fl (ref 78.0–100.0)
Monocytes Absolute: 0.6 10*3/uL (ref 0.1–1.0)
Monocytes Relative: 9 % (ref 3.0–12.0)
Neutro Abs: 3.3 10*3/uL (ref 1.4–7.7)
Neutrophils Relative %: 53.2 % (ref 43.0–77.0)
Platelets: 188 10*3/uL (ref 150.0–400.0)
RBC: 4.7 Mil/uL (ref 4.22–5.81)
RDW: 13.9 % (ref 11.5–15.5)
WBC: 6.3 10*3/uL (ref 4.0–10.5)

## 2021-02-25 MED ORDER — BREO ELLIPTA 200-25 MCG/INH IN AEPB: 1.0000 | INHALATION_SPRAY | Freq: Every day | RESPIRATORY_TRACT | 0 refills | Status: DC

## 2021-02-25 MED ORDER — BREO ELLIPTA 200-25 MCG/INH IN AEPB: 1.0000 | INHALATION_SPRAY | Freq: Every day | RESPIRATORY_TRACT | 2 refills | Status: DC

## 2021-02-25 NOTE — Addendum Note (Signed)
Addended by: Suzzanne Cloud E on: 02/25/2021 09:42 AM   Modules accepted: Orders

## 2021-02-25 NOTE — Addendum Note (Signed)
Addended by: Elton Sin on: 02/25/2021 10:16 AM   Modules accepted: Orders

## 2021-02-25 NOTE — Progress Notes (Signed)
CASSIEL FERNANDEZ    [id number]    [birth year]  Primary Care Physician:Lowne Cheri Rous Alferd Apa, DO  Referring Physician: Carollee Herter, Alferd Apa, DO [address] [place],  [place] [address]  Chief complaint: Consult for asthma  HPI: 60 year old with history of asthma, GERD, coronary artery disease Complains of worsening dyspnea on exertion associated with occasional wheezing for the past few years History notable for chloride fumes and acid fumes exposure in 2002.  He later worked in [employer] and is exposed to corrosive inks  Has history of significant GERD and is followed by GI with plans for EGD in the near future.  He is on PPI twice daily Also notes witnessed apneas at night with snoring and daytime somnolence  Pets: No pets Occupation: Used to work as a Dealer, at [employer] Exposures: No mold, hot tub, Customer service manager.  No feather pillows or comforters Smoking history: Never smoker Travel history: No significant travel history Relevant family history: No family history of lung disease   Outpatient Encounter Medications as of 02/25/2021  Medication Sig  . acetaminophen (TYLENOL) 500 MG tablet Take 1 tablet (500 mg total) by mouth every 12 (twelve) hours.  Marland Kitchen albuterol (PROVENTIL HFA;VENTOLIN HFA) 108 (90 Base) MCG/ACT inhaler Inhale 2 puffs into the lungs every 4 (four) hours as needed for wheezing or shortness of breath.  Marland Kitchen aspirin 81 MG tablet Take 81 mg by mouth daily.  Marland Kitchen atorvastatin (LIPITOR) 40 MG tablet Take 1 tablet (40 mg total) by mouth daily.  . cetirizine (ZYRTEC) 10 MG tablet Take 10 mg by mouth daily as needed for allergies.   Marland Kitchen diclofenac Sodium (VOLTAREN) 1 % GEL Apply 4 g topically 4 (four) times daily.  Marland Kitchen docusate sodium (COLACE) 100 MG capsule Take 1 capsule (100 mg total) by mouth 2 (two) times daily.  Marland Kitchen esomeprazole (NEXIUM) 40 MG capsule Take 1 capsule (40 mg total) by mouth 2 (two) times daily before a meal.  .  ezetimibe (ZETIA) 10 MG tablet TAKE ONE TABLET BY MOUTH ONE TIME DAILY  . HYDROcodone-acetaminophen (NORCO) 7.5-325 MG tablet Take 1-2 tablets by mouth every 6 (six) hours as needed for moderate pain or severe pain.  Marland Kitchen losartan (COZAAR) 50 MG tablet TAKE ONE TABLET BY MOUTH ONE TIME DAILY  . methocarbamol (ROBAXIN) 500 MG tablet Take 1 tablet (500 mg total) by mouth every 8 (eight) hours as needed for muscle spasms.  . mometasone (NASONEX) 50 MCG/ACT nasal spray Place 2 sprays into the nose daily.  . Multiple Vitamins-Minerals (MENS ONE DAILY PO) Take 1 tablet by mouth daily.  Marland Kitchen triamcinolone cream (KENALOG) 0.1 % Apply 1 application topically 2 (two) times daily.  . [DISCONTINUED] valACYclovir (VALTREX) 1000 MG tablet Take 1 tablet (1,000 mg total) by mouth 3 (three) times daily.   No facility-administered encounter medications on file as of 02/25/2021.    Allergies as of 02/25/2021  . (No Known Allergies)    Past Medical History:  Diagnosis Date  . Allergy   . Arthritis   . Asthma   . CAD (coronary artery disease)    NSTEMI 7/12:  Cardiac cath on 7/17: pLAD occluded, Dx 30-40%, pRCA 30-40%, mRCA 30-40%.  Proximal LAD was treated with a BMS.  Echo 7/19:  EF 60-65%, mild LVH.    ETT-Myoview 6/14:  Inf thinning, no ischemia, EF 64%, normal study // Myoview 04/2020: EF 57, normal perfusion; Low Risk    .  Cognitive communication deficit   . Colitis   . Diverticulosis   . Duodenitis    May 2012 with heme positive stools at this time. Endorses only rare streaking of blood now.  . External hemorrhoids   . GERD (gastroesophageal reflux disease)   . Heart murmur    as a child per the pt  . Hiatal hernia   . Hx of hemorrhoids   . Hyperlipidemia   . Hypertension   . Kidney stones   . Microadenoma    MRI in 2008 demonstrating 4.6 mm area of pituitary gland   . Migraines    Has been evaluated multiple times in past for chronic headache in which pt has had occasional nose bleeds and  bloodshot eyes. This lasted for 4-5 months. CT of the head was negative in May 2012.  . Myocardial infarction ([place]) 05/17/2011  . Pituitary tumor   . RBBB (right bundle branch block)    Noted on EKG in 2008  . Ulcer     Past Surgical History:  Procedure Laterality Date  . COLONOSCOPY    . egd  03/05/2011   Dr. Benson Norway  . FLEXIBLE SIGMOIDOSCOPY  03/05/2011   Dr. Benson Norway  . HAMMER TOE SURGERY    . Heart stent    . HERNIA REPAIR     In 20's  . ORIF TIBIA PLATEAU Left 08/04/2018   Procedure: OPEN REDUCTION INTERNAL FIXATION (ORIF) TIBIAL PLATEAU;  Surgeon: Altamese Gassaway, MD;  Location: [facility];  Service: Orthopedics;  Laterality: Left;  . SIGMOIDOSCOPY    . TOOTH EXTRACTION    . UPPER GASTROINTESTINAL ENDOSCOPY      Family History  Problem Relation Age of Onset  . Stroke Mother   . Heart attack Father 67       MI  . Skin cancer Father   . Cancer Father        ? type "rare"  . Obesity Brother   . Coronary artery disease Brother        Possible, pt not sure of specifics  . Hypertension Brother   . Colon polyps Maternal Aunt   . Colon cancer Maternal Aunt   . Colon polyps Paternal Grandmother   . Other Neg Hx   . Esophageal cancer Neg Hx   . Rectal cancer Neg Hx   . Stomach cancer Neg Hx     Social History   Socioeconomic History  . Marital status: Single    Spouse name: Not on file  . Number of children: Not on file  . Years of education: Not on file  . Highest education level: Not on file  Occupational History  . Occupation: Personnel officer-- cleans 3 buildings    Comment: Fairly physical job    Employer: [employer]  Tobacco Use  . Smoking status: Never Smoker  . Smokeless tobacco: Never Used  Vaping Use  . Vaping Use: Never used  Substance and Sexual Activity  . Alcohol use: No  . Drug use: No  . Sexual activity: Not Currently  Other Topics Concern  . Not on file  Social History Narrative   Lives with brother and mother   Mother is quite sick, he and his  brother take turns taking care of her   No children   Social Determinants of Health   Financial Resource Strain: Not on file  Food Insecurity: Not on file  Transportation Needs: Not on file  Physical Activity: Not on file  Stress: Not on file  Social  Connections: Not on file  Intimate Partner Violence: Not on file    Review of systems: Review of Systems  Constitutional: Negative for fever and chills.  HENT: Negative.   Eyes: Negative for blurred vision.  Respiratory: as per HPI  Cardiovascular: Negative for chest pain and palpitations.  Gastrointestinal: Negative for vomiting, diarrhea, blood per rectum. Genitourinary: Negative for dysuria, urgency, frequency and hematuria.  Musculoskeletal: Negative for myalgias, back pain and joint pain.  Skin: Negative for itching and rash.  Neurological: Negative for dizziness, tremors, focal weakness, seizures and loss of consciousness.  Endo/Heme/Allergies: Negative for environmental allergies.  Psychiatric/Behavioral: Negative for depression, suicidal ideas and hallucinations.  All other systems reviewed and are negative.  Physical Exam: Blood pressure 128/78, pulse 76, temperature (!) 97 F (36.1 C), temperature source Temporal, height 6\' 2"  (1.88 m), weight 282 lb 6.4 oz (128.1 kg), SpO2 96 %. Gen:      No acute distress HEENT:  EOMI, sclera anicteric Neck:     No masses; no thyromegaly Lungs:    Clear to auscultation bilaterally; normal respiratory effort CV:         Regular rate and rhythm; no murmurs Abd:      + bowel sounds; soft, non-tender; no palpable masses, no distension Ext:    No edema; adequate peripheral perfusion Skin:      Warm and dry; no rash Neuro: alert and oriented x 3 Psych: normal mood and affect  Data Reviewed: Imaging: Chest x-ray 08/05/2018- lungs are clear with no acute cardiopulmonary disease.  I have reviewed the images personally.  PFTs: 08/31/2011 FVC 5.30 [97%], FEV1 4.32 [99%], F/F 82, TLC 8.48  [99%], DLCO 31.5 [94%] Normal spirometry, normal lung volumes and diffusion capacity  Labs: CBC 08/05/2018-WBC 8.3, eos 0%  Assessment:  Moderate persistent asthma Has significant GERD which is contributing to presentation He is only on albuterol rescue inhaler.  We will start a controller medication with Breo 200  Check baseline CBC differential, IgE and PFTs Continue PPI for GERD.  He is due to get an EGD soon  Suspected sleep apnea Has witnessed apneas and daytime somnolence Schedule home sleep study  Plan/Recommendations: Breo 200 CBC, IgE, PFTs, chest x-ray Home sleep study  Marshell Garfinkel MD [facility] 02/25/2021, 9:25 AM  CC: [name], *

## 2021-02-25 NOTE — Progress Notes (Deleted)
Michael Pearson    [id number]    1960/12/24  Primary Care Physician:Lowne Chase, Alferd Apa, DO  Referring Physician: Carollee Herter, Alferd Apa, DO [address] [place],  [place] [address]  Chief complaint:    HPI:   Pets: Occupation: Exposures: Smoking history: Travel history: Relevant family history:   Outpatient Encounter Medications as of 02/25/2021  Medication Sig  . acetaminophen (TYLENOL) 500 MG tablet Take 1 tablet (500 mg total) by mouth every 12 (twelve) hours.  Marland Kitchen albuterol (PROVENTIL HFA;VENTOLIN HFA) 108 (90 Base) MCG/ACT inhaler Inhale 2 puffs into the lungs every 4 (four) hours as needed for wheezing or shortness of breath.  Marland Kitchen aspirin 81 MG tablet Take 81 mg by mouth daily.  Marland Kitchen atorvastatin (LIPITOR) 40 MG tablet Take 1 tablet (40 mg total) by mouth daily.  . cetirizine (ZYRTEC) 10 MG tablet Take 10 mg by mouth daily as needed for allergies.   Marland Kitchen diclofenac Sodium (VOLTAREN) 1 % GEL Apply 4 g topically 4 (four) times daily.  Marland Kitchen docusate sodium (COLACE) 100 MG capsule Take 1 capsule (100 mg total) by mouth 2 (two) times daily.  Marland Kitchen esomeprazole (NEXIUM) 40 MG capsule Take 1 capsule (40 mg total) by mouth 2 (two) times daily before a meal.  . ezetimibe (ZETIA) 10 MG tablet TAKE ONE TABLET BY MOUTH ONE TIME DAILY  . HYDROcodone-acetaminophen (NORCO) 7.5-325 MG tablet Take 1-2 tablets by mouth every 6 (six) hours as needed for moderate pain or severe pain.  Marland Kitchen losartan (COZAAR) 50 MG tablet TAKE ONE TABLET BY MOUTH ONE TIME DAILY  . methocarbamol (ROBAXIN) 500 MG tablet Take 1 tablet (500 mg total) by mouth every 8 (eight) hours as needed for muscle spasms.  . mometasone (NASONEX) 50 MCG/ACT nasal spray Place 2 sprays into the nose daily.  . Multiple Vitamins-Minerals (MENS ONE DAILY PO) Take 1 tablet by mouth daily.  Marland Kitchen triamcinolone cream (KENALOG) 0.1 % Apply 1 application topically 2 (two) times daily.  . [DISCONTINUED] valACYclovir (VALTREX) 1000 MG  tablet Take 1 tablet (1,000 mg total) by mouth 3 (three) times daily.   No facility-administered encounter medications on file as of 02/25/2021.    Allergies as of 02/25/2021  . (No Known Allergies)    Past Medical History:  Diagnosis Date  . Allergy   . Arthritis   . Asthma   . CAD (coronary artery disease)    NSTEMI 7/12:  Cardiac cath on 7/17: pLAD occluded, Dx 30-40%, pRCA 30-40%, mRCA 30-40%.  Proximal LAD was treated with a BMS.  Echo 7/19:  EF 60-65%, mild LVH.    ETT-Myoview 6/14:  Inf thinning, no ischemia, EF 64%, normal study // Myoview 04/2020: EF 57, normal perfusion; Low Risk    . Cognitive communication deficit   . Colitis   . Diverticulosis   . Duodenitis    May 2012 with heme positive stools at this time. Endorses only rare streaking of blood now.  . External hemorrhoids   . GERD (gastroesophageal reflux disease)   . Heart murmur    as a child per the pt  . Hiatal hernia   . Hx of hemorrhoids   . Hyperlipidemia   . Hypertension   . Kidney stones   . Microadenoma    MRI in 2008 demonstrating 4.6 mm area of pituitary gland   . Migraines    Has been evaluated multiple times in past for chronic headache in which pt  has had occasional nose bleeds and bloodshot eyes. This lasted for 4-5 months. CT of the head was negative in May 2012.  . Myocardial infarction ([facility]) 05/17/2011  . Pituitary tumor   . RBBB (right bundle branch block)    Noted on EKG in 2008  . Ulcer     Past Surgical History:  Procedure Laterality Date  . COLONOSCOPY    . egd  03/05/2011   Dr. Benson Norway  . FLEXIBLE SIGMOIDOSCOPY  03/05/2011   Dr. Benson Norway  . HAMMER TOE SURGERY    . Heart stent    . HERNIA REPAIR     In 20's  . ORIF TIBIA PLATEAU Left 08/04/2018   Procedure: OPEN REDUCTION INTERNAL FIXATION (ORIF) TIBIAL PLATEAU;  Surgeon: Altamese Marengo, MD;  Location: [facility];  Service: Orthopedics;  Laterality: Left;  . SIGMOIDOSCOPY    . TOOTH EXTRACTION    . UPPER GASTROINTESTINAL ENDOSCOPY       Family History  Problem Relation Age of Onset  . Stroke Mother   . Heart attack Father 39       MI  . Skin cancer Father   . Cancer Father        ? type "rare"  . Obesity Brother   . Coronary artery disease Brother        Possible, pt not sure of specifics  . Hypertension Brother   . Colon polyps Maternal Aunt   . Colon cancer Maternal Aunt   . Colon polyps Paternal Grandmother   . Other Neg Hx   . Esophageal cancer Neg Hx   . Rectal cancer Neg Hx   . Stomach cancer Neg Hx     Social History   Socioeconomic History  . Marital status: Single    Spouse name: Not on file  . Number of children: Not on file  . Years of education: Not on file  . Highest education level: Not on file  Occupational History  . Occupation: Personnel officer-- cleans 3 buildings    Comment: Fairly physical job    Employer: [employer]  Tobacco Use  . Smoking status: Never Smoker  . Smokeless tobacco: Never Used  Vaping Use  . Vaping Use: Never used  Substance and Sexual Activity  . Alcohol use: No  . Drug use: No  . Sexual activity: Not Currently  Other Topics Concern  . Not on file  Social History Narrative   Lives with brother and mother   Mother is quite sick, he and his brother take turns taking care of her   No children   Social Determinants of Health   Financial Resource Strain: Not on file  Food Insecurity: Not on file  Transportation Needs: Not on file  Physical Activity: Not on file  Stress: Not on file  Social Connections: Not on file  Intimate Partner Violence: Not on file    Review of systems: Review of Systems  Constitutional: Negative for fever and chills.  HENT: Negative.   Eyes: Negative for blurred vision.  Respiratory: as per HPI  Cardiovascular: Negative for chest pain and palpitations.  Gastrointestinal: Negative for vomiting, diarrhea, blood per rectum. Genitourinary: Negative for dysuria, urgency, frequency and hematuria.  Musculoskeletal:  Negative for myalgias, back pain and joint pain.  Skin: Negative for itching and rash.  Neurological: Negative for dizziness, tremors, focal weakness, seizures and loss of consciousness.  Endo/Heme/Allergies: Negative for environmental allergies.  Psychiatric/Behavioral: Negative for depression, suicidal ideas and hallucinations.  All other systems reviewed and are  negative.  Physical Exam: Blood pressure 128/78, pulse 76, temperature (!) 97 F (36.1 C), temperature source Temporal, height 6\' 2"  (1.88 m), weight 282 lb 6.4 oz (128.1 kg), SpO2 96 %. Gen:      No acute distress HEENT:  EOMI, sclera anicteric Neck:     No masses; no thyromegaly Lungs:    Clear to auscultation bilaterally; normal respiratory effort*** CV:         Regular rate and rhythm; no murmurs Abd:      + bowel sounds; soft, non-tender; no palpable masses, no distension Ext:    No edema; adequate peripheral perfusion Skin:      Warm and dry; no rash Neuro: alert and oriented x 3 Psych: normal mood and affect  Data Reviewed: Imaging:  PFTs:  Labs:  Assessment:  ***  Plan/Recommendations: *** This appointment required *** minutes of patient care (this includes precharting, chart review, review of results, face-to-face care, etc.).  Marshell Garfinkel MD [facility] 02/25/2021, 9:18 AM  CC: [name], *

## 2021-02-25 NOTE — Patient Instructions (Addendum)
Check CBC with differential, IgE Chest x-ray today, schedule PFTs Start Breo 200 Schedule home sleep study  Follow-up in 3 months.

## 2021-02-26 LAB — IGE: IgE (Immunoglobulin E), Serum: 32 kU/L (ref <=114)

## 2021-03-02 ENCOUNTER — Telehealth: Payer: Self-pay | Admitting: Pulmonary Disease

## 2021-03-02 NOTE — Telephone Encounter (Signed)
Called and spoke with patient who is requesting his lab results from 04/27. Advised patient that Dr. Vaughan Browner is working nights the next couple days so it would probably be tomorrow before we get back to him. Patient expressed understanding.   Dr. Vaughan Browner please advise

## 2021-03-03 NOTE — Telephone Encounter (Signed)
Please let him know that labs and chest x ray is normal

## 2021-03-04 NOTE — Telephone Encounter (Signed)
Spoke with the pt and notified of results per Dr Vaughan Browner. He verbalized understanding. Nothing further needed.

## 2021-03-10 ENCOUNTER — Other Ambulatory Visit: Payer: Self-pay

## 2021-03-10 ENCOUNTER — Ambulatory Visit: Payer: Medicare Other

## 2021-03-10 DIAGNOSIS — G4733 Obstructive sleep apnea (adult) (pediatric): Secondary | ICD-10-CM

## 2021-03-11 ENCOUNTER — Other Ambulatory Visit: Payer: Self-pay | Admitting: Nurse Practitioner

## 2021-03-17 ENCOUNTER — Telehealth: Payer: Self-pay | Admitting: Pulmonary Disease

## 2021-03-17 DIAGNOSIS — G4733 Obstructive sleep apnea (adult) (pediatric): Secondary | ICD-10-CM | POA: Diagnosis not present

## 2021-03-17 NOTE — Telephone Encounter (Signed)
I called and spoke with patient regarding HST and Dr Doreene Eland. He verbalized understanding and is agreeable to CPAP titration. I went ahead and sent in order and told patient they will reach out to schedule appt. Nothing further needed at this time.

## 2021-03-17 NOTE — Telephone Encounter (Signed)
Call patient  Sleep study result  Date of study: 03/10/2021  Impression: Severe obstructive sleep apnea Severe oxygen desaturations  Recommendation: Will recommend in lab titration study for optimal treatment of severe obstructive sleep apnea and severe oxygen desaturations  May require oxygen supplementation for optimal treatment  Encourage aggressive weight loss measures  Follow-up as previously scheduled    Schedule for in lab titration study    FYI: Dr. Marshell Garfinkel

## 2021-04-02 ENCOUNTER — Other Ambulatory Visit: Payer: Self-pay | Admitting: Nurse Practitioner

## 2021-04-07 ENCOUNTER — Telehealth: Payer: Self-pay

## 2021-04-07 DIAGNOSIS — E785 Hyperlipidemia, unspecified: Secondary | ICD-10-CM

## 2021-04-07 DIAGNOSIS — I2510 Atherosclerotic heart disease of native coronary artery without angina pectoris: Secondary | ICD-10-CM

## 2021-04-07 NOTE — Telephone Encounter (Signed)
Pt called. Left VM instructing pt to call the pharmacy. Pt advised that sent a 6 month supply.

## 2021-04-07 NOTE — Telephone Encounter (Signed)
Pt stated he is running our of atorvastatin 40 MG tab and he is thinking [facility] may have shorted him some when filling the prescription.  I went over the prescribing directions and pt stated he is only taking one tab per day.  He is concerned what will happen if he runs out of medication, which he will because he only has enough to get him thorough this Thursday 04/09/21).

## 2021-04-09 ENCOUNTER — Other Ambulatory Visit: Payer: Self-pay

## 2021-04-09 DIAGNOSIS — I2510 Atherosclerotic heart disease of native coronary artery without angina pectoris: Secondary | ICD-10-CM

## 2021-04-09 DIAGNOSIS — E785 Hyperlipidemia, unspecified: Secondary | ICD-10-CM

## 2021-04-09 MED ORDER — ATORVASTATIN CALCIUM 40 MG PO TABS: 1.0000 | ORAL_TABLET | Freq: Every day | ORAL | 1 refills | Status: DC

## 2021-05-09 ENCOUNTER — Other Ambulatory Visit: Payer: Self-pay | Admitting: Nurse Practitioner

## 2021-05-20 ENCOUNTER — Other Ambulatory Visit: Payer: Self-pay

## 2021-05-20 ENCOUNTER — Ambulatory Visit (HOSPITAL_BASED_OUTPATIENT_CLINIC_OR_DEPARTMENT_OTHER): Payer: Medicare Other | Attending: Pulmonary Disease | Admitting: Pulmonary Disease

## 2021-05-20 DIAGNOSIS — G4733 Obstructive sleep apnea (adult) (pediatric): Secondary | ICD-10-CM | POA: Diagnosis not present

## 2021-05-26 ENCOUNTER — Telehealth: Payer: Self-pay | Admitting: Pulmonary Disease

## 2021-05-26 DIAGNOSIS — G4733 Obstructive sleep apnea (adult) (pediatric): Secondary | ICD-10-CM

## 2021-05-26 NOTE — Telephone Encounter (Signed)
Call patient  Sleep study result  Date of study: 05/20/2021  Impression: Severe obstructive sleep apnea  Recommendation: DME referral  Trial of CPAP therapy on 16 cm H2O with a Medium size Resmed Full Face Mask AirFit F20 mask and heated humidification.  Encourage weight loss measures  Follow-up in the office 4 to 6 weeks following initiation of treatment

## 2021-05-26 NOTE — Procedures (Signed)
POLYSOMNOGRAPHY  Last, First: Trevione, Darko MRN: [id number] Gender: Male Age (years): 60 Weight (lbs): 280 DOB: [birth year] BMI: 36 Primary Care: No PCP Epworth Score: 15 Referring: Laurin Coder MD Technician: Laren Everts Interpreting: Laurin Coder MD Study Type: CPAP Ordered Study Type: CPAP Study date: 05/20/2021 Location: [facility] CLINICAL INFORMATION Michael Pearson is a 60 year old Male and was referred to the sleep center for evaluation of CPAP efficacy, OSA established on home sleep test. Indications include Daytime Fatigue, Established OSA, Hypertension, Insomnia, Obesity, Snoring.   Most recent polysomnogram dated 03/10/2021 revealed an AHI of 69.2/h. MEDICATIONS Patient self administered medications include: N/A. Medications administered during study include No sleep medicine administered.  SLEEP STUDY TECHNIQUE The patient underwent an attended overnight polysomnography titration to assess the effects of CPAP therapy. The following variables were monitored: EEG(C4-A1, C3-A2, O1-A2, O2-A1), EOG, submental and leg EMG, ECG, oxyhemoglobin saturation by pulse oximetry, thoracic and abdominal respiratory effort belts, nasal/oral airflow by pressure sensor, body position sensor and snoring sensor. CPAP pressure was titrated to eliminate apneas, hypopneas and oxygen desaturation. Hypopneas were scored per AASM definition IB (4% desaturation)  TECHNICIAN COMMENTS Comments added by Technician: NONE Comments added by Scorer: N/A SLEEP ARCHITECTURE The study was initiated at 10:27:10 PM and terminated at 4:40:35 AM. Total recorded time was 373.4 minutes. EEG confirmed total sleep time was 314.5 minutes yielding a sleep efficiency of 84.2%%. Sleep onset after lights out was 8.6 minutes with a REM latency of 205.5 minutes. The patient spent 4.9%% of the night in stage N1 sleep, 85.2%% in stage N2 sleep, 2.7%% in stage N3 and 7.2% in REM. The Arousal Index was  33.2/hour. RESPIRATORY PARAMETERS The overall AHI was 15.5 per hour, and the RDI was 22.3 events/hour with a central apnea index of 0.6per hour. The most appropriate setting of CPAP was 16 cm H2O. At this setting, the sleep efficiency was 97% and the patient was supine for 100%. The AHI was 1.3 events per hour, and the RDI was 7.4 events/hour (with 0.6 central events) and the arousal index was 15.4 per hour.The oxygen nadir was 93.0% during sleep.    The cumulative time under 88% oxygen saturation was 5.5 minutes  LEG MOVEMENT DATA The total leg movements were 186 with a resulting leg movement index of 35.5/hr. Associated arousal with leg movement index was 2.1/hr. CARDIAC DATA The underlying cardiac rhythm was most consistent with sinus rhythm. Mean heart rate during sleep was 63.6 bpm. Additional rhythm abnormalities include None. IMPRESSIONS - EKG showed no cardiac abnormalities. - No snoring was audible during this study. - Severe Obstructive Sleep apnea(OSA) Optimal pressure attained. - No Significant Central Sleep Apnea (CSA) - No significant periodic leg movements(PLMs) during sleep. However, no significant associated arousals. - Normal sleep efficiency, normal primary sleep latency, long REM sleep latency and long slow wave latency.  DIAGNOSIS - Obstructive Sleep Apnea (G47.33)  RECOMMENDATIONS - Trial of CPAP therapy on 16 cm H2O with a Medium size Resmed Full Face Mask AirFit F20 mask and heated humidification. - Avoid alcohol, sedatives and other CNS depressants that may worsen sleep apnea and disrupt normal sleep architecture. - Sleep hygiene should be reviewed to assess factors that may improve sleep quality. - Weight management and regular exercise should be initiated or continued. - Return to Sleep Center for re-evaluation after 4 weeks of therapy  [Electronically signed] 05/26/2021 02:26 PM  Sherrilyn Rist MD NPI: [id number]

## 2021-05-28 NOTE — Telephone Encounter (Signed)
I called and spoke with patient regarding HST and Dr. Ander Slade recs. Patient verbalized understanding and is agreeable to CPAP. I have sent in the order and informed the patient that a DME company will be in touch next and also of the delay due to the shortage. Patient verbalized understanding, nothing further needed.

## 2021-06-08 ENCOUNTER — Other Ambulatory Visit: Payer: Self-pay

## 2021-06-08 ENCOUNTER — Encounter: Payer: Self-pay | Admitting: Family Medicine

## 2021-06-08 ENCOUNTER — Ambulatory Visit (INDEPENDENT_AMBULATORY_CARE_PROVIDER_SITE_OTHER): Payer: Medicare Other | Admitting: Family Medicine

## 2021-06-08 VITALS — BP 124/75 | HR 71 | Temp 98.6°F | Resp 18 | Ht 74.0 in | Wt 287.0 lb

## 2021-06-08 DIAGNOSIS — E785 Hyperlipidemia, unspecified: Secondary | ICD-10-CM

## 2021-06-08 DIAGNOSIS — J4520 Mild intermittent asthma, uncomplicated: Secondary | ICD-10-CM

## 2021-06-08 DIAGNOSIS — M2042 Other hammer toe(s) (acquired), left foot: Secondary | ICD-10-CM

## 2021-06-08 DIAGNOSIS — M25561 Pain in right knee: Secondary | ICD-10-CM

## 2021-06-08 DIAGNOSIS — I10 Essential (primary) hypertension: Secondary | ICD-10-CM

## 2021-06-08 DIAGNOSIS — I2510 Atherosclerotic heart disease of native coronary artery without angina pectoris: Secondary | ICD-10-CM

## 2021-06-08 DIAGNOSIS — G8929 Other chronic pain: Secondary | ICD-10-CM

## 2021-06-08 DIAGNOSIS — M2041 Other hammer toe(s) (acquired), right foot: Secondary | ICD-10-CM

## 2021-06-08 DIAGNOSIS — M25562 Pain in left knee: Secondary | ICD-10-CM

## 2021-06-08 LAB — COMPREHENSIVE METABOLIC PANEL
ALT: 72 U/L — ABNORMAL HIGH (ref 0–53)
AST: 55 U/L — ABNORMAL HIGH (ref 0–37)
Albumin: 4.5 g/dL (ref 3.5–5.2)
Alkaline Phosphatase: 58 U/L (ref 39–117)
BUN: 12 mg/dL (ref 6–23)
CO2: 28 mEq/L (ref 19–32)
Calcium: 10.1 mg/dL (ref 8.4–10.5)
Chloride: 101 mEq/L (ref 96–112)
Creatinine, Ser: 0.79 mg/dL (ref 0.40–1.50)
GFR: 96.6 mL/min (ref >60.00)
Glucose, Bld: 134 mg/dL — ABNORMAL HIGH (ref 70–99)
Potassium: 4.8 mEq/L (ref 3.5–5.1)
Sodium: 137 mEq/L (ref 135–145)
Total Bilirubin: 0.6 mg/dL (ref 0.2–1.2)
Total Protein: 7.2 g/dL (ref 6.0–8.3)

## 2021-06-08 LAB — LIPID PANEL
Cholesterol: 129 mg/dL (ref 0–200)
HDL: 52.8 mg/dL (ref >39.00)
LDL Cholesterol: 61 mg/dL (ref 0–99)
NonHDL: 75.8
Total CHOL/HDL Ratio: 2
Triglycerides: 75 mg/dL (ref 0.0–149.0)
VLDL: 15 mg/dL (ref 0.0–40.0)

## 2021-06-08 MED ORDER — ESOMEPRAZOLE MAGNESIUM 40 MG PO CPDR: 40.0000 mg | DELAYED_RELEASE_CAPSULE | Freq: Two times a day (BID) | ORAL | 1 refills | Status: DC

## 2021-06-08 NOTE — Assessment & Plan Note (Signed)
Tolerating statin, encouraged heart healthy diet, avoid trans fats, minimize simple carbs and saturated fats. Increase exercise as tolerated 

## 2021-06-08 NOTE — Assessment & Plan Note (Signed)
Check labs F/u cardiology 

## 2021-06-08 NOTE — Assessment & Plan Note (Signed)
Stable

## 2021-06-08 NOTE — Patient Instructions (Signed)
https://www.nhlbi.nih.gov/files/docs/public/heart/dash_brief.pdf">  DASH Eating Plan DASH stands for Dietary Approaches to Stop Hypertension. The DASH eating plan is a healthy eating plan that has been shown to: Reduce high blood pressure (hypertension). Reduce your risk for type 2 diabetes, heart disease, and stroke. Help with weight loss. What are tips for following this plan? Reading food labels Check food labels for the amount of salt (sodium) per serving. Choose foods with less than 5 percent of the Daily Value of sodium. Generally, foods with less than 300 milligrams (mg) of sodium per serving fit into this eating plan. To find whole grains, look for the word "whole" as the first word in the ingredient list. Shopping Buy products labeled as "low-sodium" or "no salt added." Buy fresh foods. Avoid canned foods and pre-made or frozen meals. Cooking Avoid adding salt when cooking. Use salt-free seasonings or herbs instead of table salt or sea salt. Check with your health care provider or pharmacist before using salt substitutes. Do not fry foods. Cook foods using healthy methods such as baking, boiling, grilling, roasting, and broiling instead. Cook with heart-healthy oils, such as olive, canola, avocado, soybean, or sunflower oil. Meal planning  Eat a balanced diet that includes: 4 or more servings of fruits and 4 or more servings of vegetables each day. Try to fill one-half of your plate with fruits and vegetables. 6-8 servings of whole grains each day. Less than 6 oz (170 g) of lean meat, poultry, or fish each day. A 3-oz (85-g) serving of meat is about the same size as a deck of cards. One egg equals 1 oz (28 g). 2-3 servings of low-fat dairy each day. One serving is 1 cup (237 mL). 1 serving of nuts, seeds, or beans 5 times each week. 2-3 servings of heart-healthy fats. Healthy fats called omega-3 fatty acids are found in foods such as walnuts, flaxseeds, fortified milks, and eggs.  These fats are also found in cold-water fish, such as sardines, salmon, and mackerel. Limit how much you eat of: Canned or prepackaged foods. Food that is high in trans fat, such as some fried foods. Food that is high in saturated fat, such as fatty meat. Desserts and other sweets, sugary drinks, and other foods with added sugar. Full-fat dairy products. Do not salt foods before eating. Do not eat more than 4 egg yolks a week. Try to eat at least 2 vegetarian meals a week. Eat more home-cooked food and less restaurant, buffet, and fast food.  Lifestyle When eating at a restaurant, ask that your food be prepared with less salt or no salt, if possible. If you drink alcohol: Limit how much you use to: 0-1 drink a day for women who are not pregnant. 0-2 drinks a day for men. Be aware of how much alcohol is in your drink. In the [place] one drink equals one 12 oz bottle of beer (355 mL), one 5 oz glass of wine (148 mL), or one 1 oz glass of hard liquor (44 mL). General information Avoid eating more than 2,300 mg of salt a day. If you have hypertension, you may need to reduce your sodium intake to 1,500 mg a day. Work with your health care provider to maintain a healthy body weight or to lose weight. Ask what an ideal weight is for you. Get at least 30 minutes of exercise that causes your heart to beat faster (aerobic exercise) most days of the week. Activities may include walking, swimming, or biking. Work with your health care provider   or dietitian to adjust your eating plan to your individual calorie needs. What foods should I eat? Fruits All fresh, dried, or frozen fruit. Canned fruit in natural juice (without addedsugar). Vegetables Fresh or frozen vegetables (raw, steamed, roasted, or grilled). Low-sodium or reduced-sodium tomato and vegetable juice. Low-sodium or reduced-sodium tomatosauce and tomato paste. Low-sodium or reduced-sodium canned vegetables. Grains Whole-grain or  whole-wheat bread. Whole-grain or whole-wheat pasta. Brown rice. Oatmeal. Quinoa. Bulgur. Whole-grain and low-sodium cereals. Pita bread.Low-fat, low-sodium crackers. Whole-wheat flour tortillas. Meats and other proteins Skinless chicken or turkey. Ground chicken or turkey. Pork with fat trimmed off. Fish and seafood. Egg whites. Dried beans, peas, or lentils. Unsalted nuts, nut butters, and seeds. Unsalted canned beans. Lean cuts of beef with fat trimmed off. Low-sodium, lean precooked or cured meat, such as sausages or meatloaves. Dairy Low-fat (1%) or fat-free (skim) milk. Reduced-fat, low-fat, or fat-free cheeses. Nonfat, low-sodium ricotta or cottage cheese. Low-fat or nonfatyogurt. Low-fat, low-sodium cheese. Fats and oils Soft margarine without trans fats. Vegetable oil. Reduced-fat, low-fat, or light mayonnaise and salad dressings (reduced-sodium). Canola, safflower, olive, avocado, soybean, andsunflower oils. Avocado. Seasonings and condiments Herbs. Spices. Seasoning mixes without salt. Other foods Unsalted popcorn and pretzels. Fat-free sweets. The items listed above may not be a complete list of foods and beverages you can eat. Contact a dietitian for more information. What foods should I avoid? Fruits Canned fruit in a light or heavy syrup. Fried fruit. Fruit in cream or buttersauce. Vegetables Creamed or fried vegetables. Vegetables in a cheese sauce. Regular canned vegetables (not low-sodium or reduced-sodium). Regular canned tomato sauce and paste (not low-sodium or reduced-sodium). Regular tomato and vegetable juice(not low-sodium or reduced-sodium). Pickles. Olives. Grains Baked goods made with fat, such as croissants, muffins, or some breads. Drypasta or rice meal packs. Meats and other proteins Fatty cuts of meat. Ribs. Fried meat. Bacon. Bologna, salami, and other precooked or cured meats, such as sausages or meat loaves. Fat from the back of a pig (fatback). Bratwurst.  Salted nuts and seeds. Canned beans with added salt. Canned orsmoked fish. Whole eggs or egg yolks. Chicken or turkey with skin. Dairy Whole or 2% milk, cream, and half-and-half. Whole or full-fat cream cheese. Whole-fat or sweetened yogurt. Full-fat cheese. Nondairy creamers. Whippedtoppings. Processed cheese and cheese spreads. Fats and oils Butter. Stick margarine. Lard. Shortening. Ghee. Bacon fat. Tropical oils, suchas coconut, palm kernel, or palm oil. Seasonings and condiments Onion salt, garlic salt, seasoned salt, table salt, and sea salt. Worcestershire sauce. Tartar sauce. Barbecue sauce. Teriyaki sauce. Soy sauce, including reduced-sodium. Steak sauce. Canned and packaged gravies. Fish sauce. Oyster sauce. Cocktail sauce. Store-bought horseradish. Ketchup. Mustard. Meat flavorings and tenderizers. Bouillon cubes. Hot sauces. Pre-made or packaged marinades. Pre-made or packaged taco seasonings. Relishes. Regular saladdressings. Other foods Salted popcorn and pretzels. The items listed above may not be a complete list of foods and beverages you should avoid. Contact a dietitian for more information. Where to find more information National Heart, Lung, and Blood Institute: www.nhlbi.nih.gov American Heart Association: www.heart.org Academy of Nutrition and Dietetics: www.eatright.org National Kidney Foundation: www.kidney.org Summary The DASH eating plan is a healthy eating plan that has been shown to reduce high blood pressure (hypertension). It may also reduce your risk for type 2 diabetes, heart disease, and stroke. When on the DASH eating plan, aim to eat more fresh fruits and vegetables, whole grains, lean proteins, low-fat dairy, and heart-healthy fats. With the DASH eating plan, you should limit salt (sodium) intake to 2,300   mg a day. If you have hypertension, you may need to reduce your sodium intake to 1,500 mg a day. Work with your health care provider or dietitian to adjust  your eating plan to your individual calorie needs. This information is not intended to replace advice given to you by your health care provider. Make sure you discuss any questions you have with your healthcare provider. Document Revised: 09/21/2019 Document Reviewed: 09/21/2019 Elsevier Patient Education  2022 Elsevier Inc.  

## 2021-06-08 NOTE — Progress Notes (Signed)
Subjective:   By signing my name below, I, [name], attest that this documentation has been prepared under the direction and in the presence of Carollee Herter, Kendrick Fries R DO. 06/08/2021    Patient ID: Michael Pearson, male    DOB: Sep 14, 1961, 60 y.o.   MRN: [id number]  Chief Complaint  Patient presents with   Hypertension    Here for follow up    HPI Patient is in today for an office visit.  He mentions he is doing well. He has been having a good summer with his brothers. He has difficulty breathing and recently went to the sleep clinic to get a CPAP machine. He mentions he has been dong well on it and is sleeping better. He finds it hard to breathe when wearing a mask. He does not walk around big stores because he experiences RLE pain. He has not gone to the podiatrist or orthopedic doctor because there has been a problem with the billing.  He recently had a mole frozen on his face and is now waiting for it to fall off.  He has 1 Covid-19 vaccine at this time. He is not UTD on the shingles vaccine.   Past Medical History:  Diagnosis Date   Allergy    Arthritis    Asthma    CAD (coronary artery disease)    NSTEMI 7/12:  Cardiac cath on 7/17: pLAD occluded, Dx 30-40%, pRCA 30-40%, mRCA 30-40%.  Proximal LAD was treated with a BMS.  Echo 7/19:  EF 60-65%, mild LVH.    ETT-Myoview 6/14:  Inf thinning, no ischemia, EF 64%, normal study // Myoview 04/2020: EF 57, normal perfusion; Low Risk     Cognitive communication deficit    Colitis    Diverticulosis    Duodenitis    May 2012 with heme positive stools at this time. Endorses only rare streaking of blood now.   External hemorrhoids    GERD (gastroesophageal reflux disease)    Heart murmur    as a child per the pt   Hiatal hernia    Hx of hemorrhoids    Hyperlipidemia    Hypertension    Kidney stones    Microadenoma    MRI in 2008 demonstrating 4.6 mm area of pituitary gland    Migraines    Has been evaluated multiple times in  past for chronic headache in which pt has had occasional nose bleeds and bloodshot eyes. This lasted for 4-5 months. CT of the head was negative in May 2012.   Myocardial infarction ([place]) 05/17/2011   Pituitary tumor    RBBB (right bundle branch block)    Noted on EKG in 2008   Ulcer     Past Surgical History:  Procedure Laterality Date   COLONOSCOPY     egd  03/05/2011   Dr. Benson [place]   FLEXIBLE SIGMOIDOSCOPY  03/05/2011   Dr. Benson [place]   HAMMER TOE SURGERY     Heart stent     [place]     In 20's   ORIF TIBIA PLATEAU Left 08/04/2018   Procedure: OPEN REDUCTION INTERNAL FIXATION (ORIF) TIBIAL PLATEAU;  Surgeon: Altamese Hopland, MD;  Location: [facility];  Service: Orthopedics;  Laterality: Left;   SIGMOIDOSCOPY     TOOTH EXTRACTION     UPPER GASTROINTESTINAL ENDOSCOPY      Family History  Problem Relation Age of Onset   Stroke Mother    Heart attack Father 65       MI  Skin cancer Father    Cancer Father        ? type "rare"   Obesity Brother    Coronary artery disease Brother        Possible, pt not sure of specifics   Hypertension Brother    Colon polyps Maternal Aunt    Colon cancer Maternal Aunt    Colon polyps Paternal Grandmother    Other Neg Hx    Esophageal cancer Neg Hx    Rectal cancer Neg Hx    Stomach cancer Neg Hx     Social History   Socioeconomic History   Marital status: Single    Spouse name: Not on file   Number of children: Not on file   Years of education: Not on file   Highest education level: Not on file  Occupational History   Occupation: Personnel officer-- cleans 3 buildings    Comment: Fairly physical job    Employer: [employer]  Tobacco Use   Smoking status: Never   Smokeless tobacco: Never  Vaping Use   Vaping Use: Never used  Substance and Sexual Activity   Alcohol use: No   Drug use: No   Sexual activity: Not Currently  Other Topics Concern   Not on file  Social History Narrative   Lives with brother and mother   Mother is  quite sick, he and his brother take turns taking care of her   No children   Social Determinants of Health   Financial Resource Strain: Not on file  Food Insecurity: Not on file  Transportation Needs: Not on file  Physical Activity: Not on file  Stress: Not on file  Social Connections: Not on file  Intimate Partner Violence: Not on file    Outpatient Medications Prior to Visit  Medication Sig Dispense Refill   acetaminophen (TYLENOL) 500 MG tablet Take 1 tablet (500 mg total) by mouth every 12 (twelve) hours. 60 tablet 0   albuterol (PROVENTIL HFA;VENTOLIN HFA) 108 (90 Base) MCG/ACT inhaler Inhale 2 puffs into the lungs every 4 (four) hours as needed for wheezing or shortness of breath. 1 Inhaler 2   aspirin 81 MG tablet Take 81 mg by mouth daily.     atorvastatin (LIPITOR) 40 MG tablet Take 1 tablet (40 mg total) by mouth daily. 90 tablet 1   cetirizine (ZYRTEC) 10 MG tablet Take 10 mg by mouth daily as needed for allergies.      diclofenac Sodium (VOLTAREN) 1 % GEL Apply 4 g topically 4 (four) times daily. 100 g 2   docusate sodium (COLACE) 100 MG capsule Take 1 capsule (100 mg total) by mouth 2 (two) times daily. 20 capsule 0   ezetimibe (ZETIA) 10 MG tablet TAKE ONE TABLET BY MOUTH ONE TIME DAILY 90 tablet 1   fluticasone furoate-vilanterol (BREO ELLIPTA) 200-25 MCG/INH AEPB Inhale 1 puff into the lungs daily. 60 each 2   fluticasone furoate-vilanterol (BREO ELLIPTA) 200-25 MCG/INH AEPB Inhale 1 puff into the lungs daily. 14 each 0   HYDROcodone-acetaminophen (NORCO) 7.5-325 MG tablet Take 1-2 tablets by mouth every 6 (six) hours as needed for moderate pain or severe pain. 50 tablet 0   losartan (COZAAR) 50 MG tablet TAKE ONE TABLET BY MOUTH ONE TIME DAILY 90 tablet 1   methocarbamol (ROBAXIN) 500 MG tablet Take 1 tablet (500 mg total) by mouth every 8 (eight) hours as needed for muscle spasms. 60 tablet 0   Multiple Vitamins-Minerals (MENS ONE DAILY PO) Take 1 tablet  by mouth daily.      triamcinolone cream (KENALOG) 0.1 % Apply 1 application topically 2 (two) times daily. 30 g 2   esomeprazole (NEXIUM) 40 MG capsule TAKE ONE CAPSULE BY MOUTH TWICE DAILY BEFORE MEALS 60 capsule 0   mometasone (NASONEX) 50 MCG/ACT nasal spray Place 2 sprays into the nose daily. 17 g 2   No facility-administered medications prior to visit.    No Known Allergies  Review of Systems  Constitutional:  Negative for fever and malaise/fatigue.  HENT:  Negative for congestion.   Eyes:  Negative for blurred vision.  Respiratory:  Negative for cough and shortness of breath.   Cardiovascular:  Negative for chest pain, palpitations and leg swelling.  Gastrointestinal:  Negative for abdominal pain, blood in stool, nausea and vomiting.  Genitourinary:  Negative for dysuria and frequency.  Musculoskeletal:  Positive for joint pain (RLE). Negative for back pain and falls.  Skin:  Negative for rash.  Neurological:  Negative for dizziness, loss of consciousness and headaches.  Endo/Heme/Allergies:  Negative for environmental allergies.  Psychiatric/Behavioral:  Negative for depression. The patient is not nervous/anxious.       Objective:    Physical Exam Vitals and nursing note reviewed.  Constitutional:      General: He is not in acute distress.    Appearance: Normal appearance. He is not ill-appearing.  HENT:     Head: Normocephalic and atraumatic.     Right Ear: Tympanic membrane, ear canal and external ear normal.     Left Ear: Tympanic membrane, ear canal and external ear normal.  Eyes:     Pupils: Pupils are equal, round, and reactive to light.  Cardiovascular:     Rate and Rhythm: Normal rate and regular rhythm.     Pulses: Normal pulses.     Heart sounds: No murmur heard.   No gallop.  Pulmonary:     Effort: Pulmonary effort is normal. No respiratory distress.     Breath sounds: Normal breath sounds. No wheezing or rhonchi.  Abdominal:     General: Bowel sounds are normal.  There is no distension.     Palpations: Abdomen is soft.     Tenderness: There is no abdominal tenderness. There is no guarding.     Hernia: No hernia is present.  Musculoskeletal:        General: Tenderness present.     Cervical back: Neck supple.  Lymphadenopathy:     Cervical: No cervical adenopathy.  Skin:    General: Skin is warm and dry.  Neurological:     Mental Status: He is alert and oriented to person, place, and time.  Psychiatric:        Mood and Affect: Mood normal.        Behavior: Behavior normal.        Thought Content: Thought content normal.    BP 124/75 (BP Location: Right Arm, Patient Position: Sitting, Cuff Size: Large)   Pulse 71   Temp 98.6 F (37 C) (Oral)   Resp 18   Ht '6\' 2"'$  (1.88 m)   Wt 287 lb (130.2 kg)   SpO2 99%   BMI 36.85 kg/m  Wt Readings from Last 3 Encounters:  06/08/21 287 lb (130.2 kg)  05/20/21 280 lb (127 kg)  02/25/21 282 lb 6.4 oz (128.1 kg)    Diabetic Foot Exam - Simple   No data filed    Lab Results  Component Value Date   WBC 6.3 02/25/2021  HGB 15.1 02/25/2021   HCT 45.0 02/25/2021   PLT 188.0 02/25/2021   GLUCOSE 114 (H) 01/07/2021   CHOL 118 12/09/2020   TRIG 67.0 12/09/2020   HDL 53.60 12/09/2020   LDLCALC 51 12/09/2020   ALT 53 01/07/2021   AST 38 (H) 01/07/2021   NA 139 01/07/2021   K 4.1 01/07/2021   CL 102 01/07/2021   CREATININE 0.83 01/07/2021   BUN 12 01/07/2021   CO2 28 01/07/2021   TSH 1.60 07/16/2020   PSA 0.58 12/09/2020   INR 0.97 05/17/2011   HGBA1C 5.8 09/17/2014   MICROALBUR 2.0 (H) 12/09/2020    Lab Results  Component Value Date   TSH 1.60 07/16/2020   Lab Results  Component Value Date   WBC 6.3 02/25/2021   HGB 15.1 02/25/2021   HCT 45.0 02/25/2021   MCV 95.8 02/25/2021   PLT 188.0 02/25/2021   Lab Results  Component Value Date   NA 139 01/07/2021   K 4.1 01/07/2021   CO2 28 01/07/2021   GLUCOSE 114 (H) 01/07/2021   BUN 12 01/07/2021   CREATININE 0.83 01/07/2021    BILITOT 0.7 01/07/2021   ALKPHOS 48 01/07/2021   AST 38 (H) 01/07/2021   ALT 53 01/07/2021   PROT 7.0 01/07/2021   ALBUMIN 4.2 01/07/2021   CALCIUM 9.5 01/07/2021   ANIONGAP 9 08/05/2018   GFR 95.45 01/07/2021   Lab Results  Component Value Date   CHOL 118 12/09/2020   Lab Results  Component Value Date   HDL 53.60 12/09/2020   Lab Results  Component Value Date   LDLCALC 51 12/09/2020   Lab Results  Component Value Date   TRIG 67.0 12/09/2020   Lab Results  Component Value Date   CHOLHDL 2 12/09/2020   Lab Results  Component Value Date   HGBA1C 5.8 09/17/2014       Assessment & Plan:   Problem List Items Addressed This Visit       Unprioritized   Chronic pain of both knees   Relevant Orders   Ambulatory referral to Orthopedic Surgery   Hypertension - Primary   Relevant Orders   Lipid panel   Comprehensive metabolic panel   Coronary atherosclerosis of native coronary artery    Check labs  F/u cardiology       Essential hypertension    Well controlled, no changes to meds. Encouraged heart healthy diet such as the DASH diet and exercise as tolerated.        Hyperlipidemia    Tolerating statin, encouraged heart healthy diet, avoid trans fats, minimize simple carbs and saturated fats. Increase exercise as tolerated       Relevant Orders   Lipid panel   Comprehensive metabolic panel   Mild intermittent asthma    Stable        Other Visit Diagnoses     Hammer toes of both feet       Relevant Orders   Ambulatory referral to Orthopedic Surgery       Meds ordered this encounter  Medications   esomeprazole (NEXIUM) 40 MG capsule    Sig: Take 1 capsule (40 mg total) by mouth 2 (two) times daily before a meal.    Dispense:  180 capsule    Refill:  1    I,[name],acting as a scribe for [employer], DO.,have documented all relevant documentation on the behalf of [name], DO,as directed by  [name], DO  while in the presence of [name], DO.   I, Carollee Herter, [place] DO. , personally preformed the services described in this documentation.  All medical record entries made by the scribe were at my direction and in my presence.  I have reviewed the chart and discharge instructions (if applicable) and agree that the record reflects my personal performance and is accurate and complete. 06/08/2021

## 2021-06-08 NOTE — Assessment & Plan Note (Signed)
Well controlled, no changes to meds. Encouraged heart healthy diet such as the DASH diet and exercise as tolerated.  °

## 2021-06-10 ENCOUNTER — Telehealth: Payer: Self-pay

## 2021-06-10 NOTE — Telephone Encounter (Signed)
[facility] NP called in she did a home visit with and his A1C was 7.3. She just wanted to make you aware.

## 2021-06-15 ENCOUNTER — Other Ambulatory Visit: Payer: Self-pay | Admitting: Family Medicine

## 2021-06-15 DIAGNOSIS — E1169 Type 2 diabetes mellitus with other specified complication: Secondary | ICD-10-CM

## 2021-06-15 DIAGNOSIS — R7989 Other specified abnormal findings of blood chemistry: Secondary | ICD-10-CM

## 2021-06-17 ENCOUNTER — Telehealth: Payer: Self-pay | Admitting: *Deleted

## 2021-06-17 NOTE — Telephone Encounter (Signed)
Cholesterol is good---- liver function elevated  Did he start taking tylenol again?    Recheck labs in 3 months

## 2021-06-17 NOTE — Telephone Encounter (Signed)
Advised patient of lab results.  He stated that he has not been taking any tylenlol.  He asked about his sugar and it was 134 and he stated that he checked it at home it was a little.  Would you like to add an a1c when he rechecks in 42mo?

## 2021-07-09 ENCOUNTER — Ambulatory Visit (INDEPENDENT_AMBULATORY_CARE_PROVIDER_SITE_OTHER): Payer: Medicare Other

## 2021-07-09 ENCOUNTER — Other Ambulatory Visit: Payer: Self-pay

## 2021-07-09 VITALS — BP 110/80 | HR 78 | Temp 98.1°F | Resp 16 | Ht 74.0 in | Wt 287.6 lb

## 2021-07-09 DIAGNOSIS — Z0000 Encounter for general adult medical examination without abnormal findings: Secondary | ICD-10-CM | POA: Diagnosis not present

## 2021-07-09 NOTE — Patient Instructions (Addendum)
Michael Pearson , Thank you for taking time to come for your Medicare Wellness Visit. I appreciate your ongoing commitment to your health goals. Please review the following plan we discussed and let me know if I can assist you in the future.   Screening recommendations/referrals: Colonoscopy: Completed 06/26/2018-Due 06/26/2028 Recommended yearly ophthalmology/optometry visit for glaucoma screening and checkup Recommended yearly dental visit for hygiene and checkup  Vaccinations: Influenza vaccine: Due-May obtain vaccine at our office or your local pharmacy Pneumococcal vaccine:Due at age 60 Tdap vaccine: Up to date-12/27/2025 Shingles vaccine: Declined   Covid-19: Booster due  Advanced directives: Declined information today  Conditions/risks identified: Please follow up with Dr. Loanne Drilling regarding elevated blood sugar.  Next appointment: Follow up in one year for your annual wellness visit 07/13/2022 @ 2:20.  Preventive Care 40-64 Years, Male Preventive care refers to lifestyle choices and visits with your health care provider that can promote health and wellness. What does preventive care include? A yearly physical exam. This is also called an annual well check. Dental exams once or twice a year. Routine eye exams. Ask your health care provider how often you should have your eyes checked. Personal lifestyle choices, including: Daily care of your teeth and gums. Regular physical activity. Eating a healthy diet. Avoiding tobacco and drug use. Limiting alcohol use. Practicing safe sex. Taking low-dose aspirin every day starting at age 60. What happens during an annual well check? The services and screenings done by your health care provider during your annual well check will depend on your age, overall health, lifestyle risk factors, and family history of disease. Counseling  Your health care provider may ask you questions about your: Alcohol use. Tobacco use. Drug use. Emotional  well-being. Home and relationship well-being. Sexual activity. Eating habits. Work and work Statistician. Screening  You may have the following tests or measurements: Height, weight, and BMI. Blood pressure. Lipid and cholesterol levels. These may be checked every 5 years, or more frequently if you are over 34 years old. Skin check. Lung cancer screening. You may have this screening every year starting at age 60 if you have a 30-pack-year history of smoking and currently smoke or have quit within the past 15 years. Fecal occult blood test (FOBT) of the stool. You may have this test every year starting at age 60. Flexible sigmoidoscopy or colonoscopy. You may have a sigmoidoscopy every 5 years or a colonoscopy every 10 years starting at age 60. Prostate cancer screening. Recommendations will vary depending on your family history and other risks. Hepatitis C blood test. Hepatitis B blood test. Sexually transmitted disease (STD) testing. Diabetes screening. This is done by checking your blood sugar (glucose) after you have not eaten for a while (fasting). You may have this done every 1-3 years. Discuss your test results, treatment options, and if necessary, the need for more tests with your health care provider. Vaccines  Your health care provider may recommend certain vaccines, such as: Influenza vaccine. This is recommended every year. Tetanus, diphtheria, and acellular pertussis (Tdap, Td) vaccine. You may need a Td booster every 10 years. Zoster vaccine. You may need this after age 60. Pneumococcal 13-valent conjugate (PCV13) vaccine. You may need this if you have certain conditions and have not been vaccinated. Pneumococcal polysaccharide (PPSV23) vaccine. You may need one or two doses if you smoke cigarettes or if you have certain conditions. Talk to your health care provider about which screenings and vaccines you need and how often you need them. This  information is not intended to  replace advice given to you by your health care provider. Make sure you discuss any questions you have with your health care provider. Document Released: 11/14/2015 Document Revised: 07/07/2016 Document Reviewed: 08/19/2015 Elsevier Interactive Patient Education  2017 [place] Prevention in the Home Falls can cause injuries. They can happen to people of all ages. There are many things you can do to make your home safe and to help prevent falls. What can I do on the outside of my home? Regularly fix the edges of walkways and driveways and fix any cracks. Remove anything that might make you trip as you walk through a door, such as a raised step or threshold. Trim any bushes or trees on the path to your home. Use bright outdoor lighting. Clear any walking paths of anything that might make someone trip, such as rocks or tools. Regularly check to see if handrails are loose or broken. Make sure that both sides of any steps have handrails. Any raised decks and porches should have guardrails on the edges. Have any leaves, snow, or ice cleared regularly. Use sand or salt on walking paths during winter. Clean up any spills in your garage right away. This includes oil or grease spills. What can I do in the bathroom? Use night lights. Install grab bars by the toilet and in the tub and shower. Do not use towel bars as grab bars. Use non-skid mats or decals in the tub or shower. If you need to sit down in the shower, use a plastic, non-slip stool. Keep the floor dry. Clean up any water that spills on the floor as soon as it happens. Remove soap buildup in the tub or shower regularly. Attach bath mats securely with double-sided non-slip rug tape. Do not have throw rugs and other things on the floor that can make you trip. What can I do in the bedroom? Use night lights. Make sure that you have a light by your bed that is easy to reach. Do not use any sheets or blankets that are too big for  your bed. They should not hang down onto the floor. Have a firm chair that has side arms. You can use this for support while you get dressed. Do not have throw rugs and other things on the floor that can make you trip. What can I do in the kitchen? Clean up any spills right away. Avoid walking on wet floors. Keep items that you use a lot in easy-to-reach places. If you need to reach something above you, use a strong step stool that has a grab bar. Keep electrical cords out of the way. Do not use floor polish or wax that makes floors slippery. If you must use wax, use non-skid floor wax. Do not have throw rugs and other things on the floor that can make you trip. What can I do with my stairs? Do not leave any items on the stairs. Make sure that there are handrails on both sides of the stairs and use them. Fix handrails that are broken or loose. Make sure that handrails are as long as the stairways. Check any carpeting to make sure that it is firmly attached to the stairs. Fix any carpet that is loose or worn. Avoid having throw rugs at the top or bottom of the stairs. If you do have throw rugs, attach them to the floor with carpet tape. Make sure that you have a light switch at the top  of the stairs and the bottom of the stairs. If you do not have them, ask someone to add them for you. What else can I do to help prevent falls? Wear shoes that: Do not have high heels. Have rubber bottoms. Are comfortable and fit you well. Are closed at the toe. Do not wear sandals. If you use a stepladder: Make sure that it is fully opened. Do not climb a closed stepladder. Make sure that both sides of the stepladder are locked into place. Ask someone to hold it for you, if possible. Clearly mark and make sure that you can see: Any grab bars or handrails. First and last steps. Where the edge of each step is. Use tools that help you move around (mobility aids) if they are needed. These  include: Canes. Walkers. Scooters. Crutches. Turn on the lights when you go into a dark area. Replace any light bulbs as soon as they burn out. Set up your furniture so you have a clear path. Avoid moving your furniture around. If any of your floors are uneven, fix them. If there are any pets around you, be aware of where they are. Review your medicines with your doctor. Some medicines can make you feel dizzy. This can increase your chance of falling. Ask your doctor what other things that you can do to help prevent falls. This information is not intended to replace advice given to you by your health care provider. Make sure you discuss any questions you have with your health care provider. Document Released: 08/14/2009 Document Revised: 03/25/2016 Document Reviewed: 11/22/2014 Elsevier Interactive Patient Education  2017 [employer].

## 2021-07-09 NOTE — Progress Notes (Signed)
Subjective:   Michael Pearson is a 60 y.o. male who presents for an Initial Medicare Annual Wellness Visit.  Review of Systems     Cardiac Risk Factors include: male gender;hypertension;dyslipidemia;obesity (BMI >30kg/m2)     Objective:    Today's Vitals   07/09/21 1537 07/09/21 1541  BP: 110/80   Pulse: 78   Resp: 16   Temp: 98.1 F (36.7 C)   TempSrc: Temporal   SpO2: 95%   Weight: 287 lb 9.6 oz (130.5 kg)   Height: '6\' 2"'$  (1.88 m)   PainSc:  10-Worst pain ever   Body mass index is 36.93 kg/m.  Advanced Directives 07/09/2021 05/20/2021 08/04/2018 08/04/2018 08/03/2018 11/30/2016 02/03/2013  Does Patient Have a Medical Advance Directive? No No No No No No Patient would like information  Would patient like information on creating a medical advance directive? No - Patient declined No - Patient declined No - Patient declined No - Patient declined No - Patient declined No - Patient declined Advance directive packet given    Current Medications (verified) Outpatient Encounter Medications as of 07/09/2021  Medication Sig   acetaminophen (TYLENOL) 500 MG tablet Take 1 tablet (500 mg total) by mouth every 12 (twelve) hours.   albuterol (PROVENTIL HFA;VENTOLIN HFA) 108 (90 Base) MCG/ACT inhaler Inhale 2 puffs into the lungs every 4 (four) hours as needed for wheezing or shortness of breath.   aspirin 81 MG tablet Take 81 mg by mouth daily.   atorvastatin (LIPITOR) 40 MG tablet Take 1 tablet (40 mg total) by mouth daily.   cetirizine (ZYRTEC) 10 MG tablet Take 10 mg by mouth daily as needed for allergies.    diclofenac Sodium (VOLTAREN) 1 % GEL Apply 4 g topically 4 (four) times daily.   docusate sodium (COLACE) 100 MG capsule Take 1 capsule (100 mg total) by mouth 2 (two) times daily.   esomeprazole (NEXIUM) 40 MG capsule Take 1 capsule (40 mg total) by mouth 2 (two) times daily before a meal.   ezetimibe (ZETIA) 10 MG tablet TAKE ONE TABLET BY MOUTH ONE TIME DAILY   fluticasone  furoate-vilanterol (BREO ELLIPTA) 200-25 MCG/INH AEPB Inhale 1 puff into the lungs daily.   fluticasone furoate-vilanterol (BREO ELLIPTA) 200-25 MCG/INH AEPB Inhale 1 puff into the lungs daily.   HYDROcodone-acetaminophen (NORCO) 7.5-325 MG tablet Take 1-2 tablets by mouth every 6 (six) hours as needed for moderate pain or severe pain.   losartan (COZAAR) 50 MG tablet TAKE ONE TABLET BY MOUTH ONE TIME DAILY   methocarbamol (ROBAXIN) 500 MG tablet Take 1 tablet (500 mg total) by mouth every 8 (eight) hours as needed for muscle spasms.   mometasone (NASONEX) 50 MCG/ACT nasal spray Place 2 sprays into the nose daily.   Multiple Vitamins-Minerals (MENS ONE DAILY PO) Take 1 tablet by mouth daily.   triamcinolone cream (KENALOG) 0.1 % Apply 1 application topically 2 (two) times daily.   No facility-administered encounter medications on file as of 07/09/2021.    Allergies (verified) Patient has no known allergies.   History: Past Medical History:  Diagnosis Date   Allergy    Arthritis    Asthma    CAD (coronary artery disease)    NSTEMI 7/12:  Cardiac cath on 7/17: pLAD occluded, Dx 30-40%, pRCA 30-40%, mRCA 30-40%.  Proximal LAD was treated with a BMS.  Echo 7/19:  EF 60-65%, mild LVH.    ETT-Myoview 6/14:  Inf thinning, no ischemia, EF 64%, normal study // Myoview 04/2020: EF 57,  normal perfusion; Low Risk     Cognitive communication deficit    Colitis    Diverticulosis    Duodenitis    May 2012 with heme positive stools at this time. Endorses only rare streaking of blood now.   External hemorrhoids    GERD (gastroesophageal reflux disease)    Heart murmur    as a child per the pt   Hiatal hernia    Hx of hemorrhoids    Hyperlipidemia    Hypertension    Kidney stones    Microadenoma    MRI in 2008 demonstrating 4.6 mm area of pituitary gland    Migraines    Has been evaluated multiple times in past for chronic headache in which pt has had occasional nose bleeds and bloodshot eyes.  This lasted for 4-5 months. CT of the head was negative in May 2012.   Myocardial infarction ([place]) 05/17/2011   Pituitary tumor    RBBB (right bundle branch block)    Noted on EKG in 2008   Ulcer    Past Surgical History:  Procedure Laterality Date   COLONOSCOPY     egd  03/05/2011   Dr. Benson [place]   FLEXIBLE SIGMOIDOSCOPY  03/05/2011   Dr. Benson [place]   HAMMER TOE SURGERY     Heart stent     [place]     In 20's   ORIF TIBIA PLATEAU Left 08/04/2018   Procedure: OPEN REDUCTION INTERNAL FIXATION (ORIF) TIBIAL PLATEAU;  Surgeon: Altamese Denair, MD;  Location: [facility];  Service: Orthopedics;  Laterality: Left;   SIGMOIDOSCOPY     TOOTH EXTRACTION     UPPER GASTROINTESTINAL ENDOSCOPY     Family History  Problem Relation Age of Onset   Stroke Mother    Heart attack Father 12       MI   Skin cancer Father    Cancer Father        ? type "rare"   Obesity Brother    Coronary artery disease Brother        Possible, pt not sure of specifics   Hypertension Brother    Colon polyps Maternal Aunt    Colon cancer Maternal Aunt    Colon polyps Paternal Grandmother    Other Neg Hx    Esophageal cancer Neg Hx    Rectal cancer Neg Hx    Stomach cancer Neg Hx    Social History   Socioeconomic History   Marital status: Single    Spouse name: Not on file   Number of children: Not on file   Years of education: Not on file   Highest education level: Not on file  Occupational History   Occupation: Personnel officer-- cleans 3 buildings    Comment: Fairly physical job    Employer: [employer]  Tobacco Use   Smoking status: Never   Smokeless tobacco: Never  Vaping Use   Vaping Use: Never used  Substance and Sexual Activity   Alcohol use: No   Drug use: No   Sexual activity: Not Currently  Other Topics Concern   Not on file  Social History Narrative   Lives with brother and mother   Mother is quite sick, he and his brother take turns taking care of her   No children   Social  Determinants of Health   Financial Resource Strain: Low Risk    Difficulty of Paying Living Expenses: Not hard at all  Food Insecurity: No Food Insecurity   Worried About Running  Out of Food in the Last Year: Never true   Ran Out of Food in the Last Year: Never true  Transportation Needs: No Transportation Needs   Lack of Transportation (Medical): No   Lack of Transportation (Non-Medical): No  Physical Activity: Sufficiently Active   Days of Exercise per Week: 7 days   Minutes of Exercise per Session: 30 min  Stress: No Stress Concern Present   Feeling of Stress : Not at all  Social Connections: Socially Isolated   Frequency of Communication with Friends and Family: More than three times a week   Frequency of Social Gatherings with Friends and Family: More than three times a week   Attends Religious Services: Never   Marine scientist or Organizations: No   Attends Music therapist: Never   Marital Status: Never married    Tobacco Counseling Counseling given: Not Answered   Clinical Intake:  Pre-visit preparation completed: Yes  Pain : 0-10 Pain Score: 10-Worst pain ever (patient states this is normal for him) Pain Type: Chronic pain (patient states he has headaches all the time) Pain Location: Hand Pain Onset: More than a month ago Pain Frequency: Intermittent     BMI - recorded: 36.93 Nutritional Status: BMI > 30  Obese Nutritional Risks: None Diabetes: No  How often do you need to have someone help you when you read instructions, pamphlets, or other written materials from your doctor or pharmacy?: 1 - Never  Diabetic?No  Interpreter Needed?: No  Information entered by :: Caroleen Hamman LPN   Activities of Daily Living In your present state of health, do you have any difficulty performing the following activities: 07/09/2021 07/31/2020  Hearing? N N  Vision? N N  Difficulty concentrating or making decisions? N N  Walking or climbing stairs?  N Y  Dressing or bathing? N N  Doing errands, shopping? N N  Preparing Food and eating ? N -  Using the Toilet? N -  In the past six months, have you accidently leaked urine? N -  Do you have problems with loss of bowel control? N -  Managing your Medications? N -  Managing your Finances? N -  Housekeeping or managing your Housekeeping? N -  Some recent data might be hidden    Patient Care Team: Carollee Herter, Alferd Apa, DO as PCP - General (Family Medicine) Sherren Mocha, MD as PCP - Cardiology (Cardiology) Hillary Bow, MD as Consulting Physician (Cardiology) Renato Shin, MD as Consulting Physician (Endocrinology) Altamese Carmi, MD as Consulting Physician (Orthopedic Surgery) Dermatology, Verdene Lennert, Ernesto Rutherford, MD as Consulting Physician (Pulmonary Disease)  Indicate any recent Medical Services you may have received from other than [facility] providers in the past year (date may be approximate).     Assessment:   This is a routine wellness examination for Jamison.  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 - Comments:: Last eye exam-2 years  Dietary issues and exercise activities discussed: Current Exercise Habits: Home exercise routine, Type of exercise: walking;strength training/weights, Time (Minutes): 30, Frequency (Times/Week): 7, Weekly Exercise (Minutes/Week): 210, Intensity: Mild, Exercise limited by: None identified   Goals Addressed             This Visit's Progress    Patient Stated       Continue eating healthy foods       Depression Screen PHQ 2/9 Scores 07/09/2021 06/08/2021 11/24/2017 11/30/2016 11/23/2016 11/03/2012 11/03/2012  PHQ - 2 Score 0 2 0 0 0 0 0  PHQ- 9 Score - 2 - - - - -    Fall Risk Fall Risk  07/09/2021 06/08/2021 11/24/2017 11/30/2016 11/23/2016  Falls in the past year? 0 0 Yes Yes Yes  Number falls in past yr: 0 0 '1 1 1  '$ Injury with Fall? 0 0 No No No  Risk  for fall due to : - - - Impaired balance/gait;History of fall(s) -  Follow up Falls prevention discussed - - Falls prevention discussed -    FALL RISK PREVENTION PERTAINING TO THE HOME:  Any stairs in or around the home? Yes  If so, are there any without handrails? No  Home free of loose throw rugs in walkways, pet beds, electrical cords, etc? Yes  Adequate lighting in your home to reduce risk of falls? Yes   ASSISTIVE DEVICES UTILIZED TO PREVENT FALLS:  Life alert? No  Use of a cane, walker or w/c? No  Grab bars in the bathroom? No  Shower chair or bench in shower? No  Elevated toilet seat or a handicapped toilet? No   TIMED UP AND GO:  Was the test performed? Yes .  Length of time to ambulate 10 feet: 10 sec.   Gait steady and fast without use of assistive device  Cognitive Function:Normal cognitive status assessed by direct observation by this Nurse Health Advisor. No abnormalities found.          Immunizations Immunization History  Administered Date(s) Administered   Influenza Whole 09/01/2012   Influenza,inj,Quad PF,6+ Mos 09/17/2014, 08/14/2015, 11/23/2016, 08/05/2018, 08/15/2019, 07/31/2020   Janssen (J&J) SARS-COV-2 Vaccination 08/05/2020   Pneumococcal Conjugate-13 09/17/2014   Tdap 05/15/2012, 12/28/2015    TDAP status: Up to date  Flu Vaccine status: Due, Education has been provided regarding the importance of this vaccine. Advised may receive this vaccine at local pharmacy or Health Dept. Aware to provide a copy of the vaccination record if obtained from local pharmacy or Health Dept. Verbalized acceptance and understanding.  Pneumococcal vaccine status: Up to date  Covid-19 vaccine status: Information provided on how to obtain vaccines. Booster due  Qualifies for Shingles Vaccine? Yes   Zostavax completed No   Shingrix Completed?: No.    Education has been provided regarding the importance of this vaccine. Patient has been advised to call insurance  company to determine out of pocket expense if they have not yet received this vaccine. Advised may also receive vaccine at local pharmacy or Health Dept. Verbalized acceptance and understanding.  Screening Tests Health Maintenance  Topic Date Due   COVID-19 Vaccine (2 - Booster for Janssen series) 09/30/2020   INFLUENZA VACCINE  06/01/2021   Zoster Vaccines- Shingrix (1 of 2) 09/08/2021 (Originally 12/27/2010)   TETANUS/TDAP  12/27/2025   COLONOSCOPY (Pts 45-66yr Insurance coverage will need to be confirmed)  06/26/2028   Hepatitis C Screening  Completed   HIV Screening  Completed   Pneumococcal Vaccine [id number] old  Aged Out   HPV VSevierMaintenance Due  Topic Date Due   COVID-19 Vaccine (2 - Booster for Janssen series) 09/30/2020   INFLUENZA VACCINE  06/01/2021    Colorectal cancer screening: Type of screening: Colonoscopy. Completed 06/26/2018. Repeat every 10 years  Lung Cancer Screening: (Low Dose CT Chest recommended if Age 60-80years, 30 pack-year currently smoking OR have quit w/in 15years.) does not qualify.     Additional Screening:  Hepatitis C Screening: Completed 01/07/2021  Vision Screening: Recommended annual ophthalmology exams for early detection of glaucoma and other disorders of the eye. Is the patient up to date with their annual eye exam?  No  Patient plans to make an appt soon.  Dental Screening: Recommended annual dental exams for proper oral hygiene  Community Resource Referral / Chronic Care Management: CRR required this visit?  No   CCM required this visit?  No      Plan:     I have personally reviewed and noted the following in the patient's chart:   Medical and social history Use of alcohol, tobacco or illicit drugs  Current medications and supplements including opioid prescriptions. Patient is not currently taking opioid prescriptions. Functional ability and status Nutritional  status Physical activity Advanced directives List of other physicians Hospitalizations, surgeries, and ER visits in previous 12 months Vitals Screenings to include cognitive, depression, and falls Referrals and appointments  In addition, I have reviewed and discussed with patient certain preventive protocols, quality metrics, and best practice recommendations. A written personalized care plan for preventive services as well as general preventive health recommendations were provided to patient.     Marta Antu, LPN   [id number]  Nurse Health Advisor  Nurse Notes: Patient states a nurse from [employer] came to his house for a visit & she told him his sugar was elevated. See message in chart from 06/10/21.

## 2021-07-29 ENCOUNTER — Encounter: Payer: Self-pay | Admitting: Pulmonary Disease

## 2021-07-29 ENCOUNTER — Ambulatory Visit (INDEPENDENT_AMBULATORY_CARE_PROVIDER_SITE_OTHER): Payer: Medicare Other | Admitting: Pulmonary Disease

## 2021-07-29 ENCOUNTER — Other Ambulatory Visit: Payer: Self-pay

## 2021-07-29 ENCOUNTER — Ambulatory Visit (INDEPENDENT_AMBULATORY_CARE_PROVIDER_SITE_OTHER): Payer: Medicare Other | Admitting: Endocrinology

## 2021-07-29 ENCOUNTER — Other Ambulatory Visit: Payer: Self-pay | Admitting: Family Medicine

## 2021-07-29 ENCOUNTER — Telehealth: Payer: Self-pay | Admitting: Pulmonary Disease

## 2021-07-29 VITALS — BP 140/84 | HR 67 | Ht 75.0 in | Wt 287.6 lb

## 2021-07-29 VITALS — BP 140/76 | HR 75 | Temp 97.7°F | Ht 74.0 in | Wt 288.8 lb

## 2021-07-29 DIAGNOSIS — E785 Hyperlipidemia, unspecified: Secondary | ICD-10-CM

## 2021-07-29 DIAGNOSIS — J4540 Moderate persistent asthma, uncomplicated: Secondary | ICD-10-CM | POA: Diagnosis not present

## 2021-07-29 DIAGNOSIS — I10 Essential (primary) hypertension: Secondary | ICD-10-CM

## 2021-07-29 DIAGNOSIS — G4733 Obstructive sleep apnea (adult) (pediatric): Secondary | ICD-10-CM

## 2021-07-29 DIAGNOSIS — D352 Benign neoplasm of pituitary gland: Secondary | ICD-10-CM

## 2021-07-29 DIAGNOSIS — R739 Hyperglycemia, unspecified: Secondary | ICD-10-CM

## 2021-07-29 LAB — TSH: TSH: 1.87 u[IU]/mL (ref 0.35–5.50)

## 2021-07-29 LAB — PROLACTIN: Prolactin: 130.3 ng/mL — ABNORMAL HIGH (ref 2.0–18.0)

## 2021-07-29 LAB — HEMOGLOBIN A1C: Hgb A1c MFr Bld: 8.2 % — ABNORMAL HIGH (ref 4.6–6.5)

## 2021-07-29 LAB — T4, FREE: Free T4: 0.74 ng/dL (ref 0.60–1.60)

## 2021-07-29 MED ORDER — METFORMIN HCL ER 500 MG PO TB24
1000.0000 mg | ORAL_TABLET | Freq: Every day | ORAL | 3 refills | Status: DC
Start: 2021-07-29 — End: 2022-09-20

## 2021-07-29 NOTE — Telephone Encounter (Signed)
ATC Patient.  LM to call back.  Patient was seen in office today by Dr. Vaughan Browner. Patient has previous order for PFT that has not been scheduled.  Dr. Vaughan Browner would like Patient scheduled for full PFT's.

## 2021-07-29 NOTE — Patient Instructions (Addendum)
Blood tests are requested for you today.  We'll let you know about the results.  Depending on how high then blood test is, I may prescribe for you a different type of medication for it.  Please come back for a follow-up appointment in 6 months.

## 2021-07-29 NOTE — Progress Notes (Signed)
Michael Pearson    [id number]    1961/01/07  Primary Care Physician:Lowne Cheri Rous Alferd Apa, DO  Referring Physician: Carollee Herter, Alferd Apa, DO [address] [place],  [place] [address]  Chief complaint: Follow-up for asthma, severe OSA  HPI: 60 year old with history of asthma, GERD, coronary artery disease Complains of worsening dyspnea on exertion associated with occasional wheezing for the past few years History notable for chloride fumes and acid fumes exposure in 2002.  He later worked in [employer] and is exposed to corrosive inks  Has history of significant GERD and is followed by GI with plans for EGD in early 2022 but it was deferred until he could be evaluated in the pulmonary office.Marland Kitchen  He is on PPI twice daily Also notes witnessed apneas at night with snoring and daytime somnolence  Pets: No pets Occupation: Used to work as a Dealer, at [employer] Exposures: No mold, hot tub, Customer service manager.  No feather pillows or comforters Smoking history: Never smoker Travel history: No significant travel history Relevant family history: No family history of lung disease  Interim history: He had a sleep study which showed severe OSA.  Titrated to CPAP of 16 Here for follow-up and review of download  Started on Breo inhaler at last visit.  States that breathing is improved with this therapy. No new complaints today.  Outpatient Encounter Medications as of 07/29/2021  Medication Sig   acetaminophen (TYLENOL) 500 MG tablet Take 1 tablet (500 mg total) by mouth every 12 (twelve) hours.   albuterol (PROVENTIL HFA;VENTOLIN HFA) 108 (90 Base) MCG/ACT inhaler Inhale 2 puffs into the lungs every 4 (four) hours as needed for wheezing or shortness of breath.   aspirin 81 MG tablet Take 81 mg by mouth daily.   atorvastatin (LIPITOR) 40 MG tablet Take 1 tablet (40 mg total) by mouth daily.   cetirizine (ZYRTEC) 10 MG tablet Take 10 mg by mouth daily as  needed for allergies.    diclofenac Sodium (VOLTAREN) 1 % GEL Apply 4 g topically 4 (four) times daily.   docusate sodium (COLACE) 100 MG capsule Take 1 capsule (100 mg total) by mouth 2 (two) times daily.   esomeprazole (NEXIUM) 40 MG capsule Take 1 capsule (40 mg total) by mouth 2 (two) times daily before a meal.   ezetimibe (ZETIA) 10 MG tablet TAKE ONE TABLET BY MOUTH ONE TIME DAILY   fluticasone furoate-vilanterol (BREO ELLIPTA) 200-25 MCG/INH AEPB Inhale 1 puff into the lungs daily.   HYDROcodone-acetaminophen (NORCO) 7.5-325 MG tablet Take 1-2 tablets by mouth every 6 (six) hours as needed for moderate pain or severe pain.   losartan (COZAAR) 50 MG tablet TAKE ONE TABLET BY MOUTH ONE TIME DAILY   metFORMIN (GLUCOPHAGE-XR) 500 MG 24 hr tablet Take 2 tablets (1,000 mg total) by mouth daily.   methocarbamol (ROBAXIN) 500 MG tablet Take 1 tablet (500 mg total) by mouth every 8 (eight) hours as needed for muscle spasms.   Multiple Vitamins-Minerals (MENS ONE DAILY PO) Take 1 tablet by mouth daily.   triamcinolone cream (KENALOG) 0.1 % Apply 1 application topically 2 (two) times daily.   [DISCONTINUED] fluticasone furoate-vilanterol (BREO ELLIPTA) 200-25 MCG/INH AEPB Inhale 1 puff into the lungs daily.   mometasone (NASONEX) 50 MCG/ACT nasal spray Place 2 sprays into the nose daily.   [DISCONTINUED] ezetimibe (ZETIA) 10 MG tablet TAKE ONE TABLET BY MOUTH ONE TIME DAILY   [  DISCONTINUED] losartan (COZAAR) 50 MG tablet TAKE ONE TABLET BY MOUTH ONE TIME DAILY   No facility-administered encounter medications on file as of 07/29/2021.   Physical Exam: Blood pressure 140/76, pulse 75, temperature 97.7 F (36.5 C), temperature source Oral, height 6\' 2"  (1.88 m), weight 288 lb 12.8 oz (131 kg), SpO2 95 %. Gen:      No acute distress HEENT:  EOMI, sclera anicteric Neck:     No masses; no thyromegaly Lungs:    Clear to auscultation bilaterally; normal respiratory effort CV:         Regular rate and  rhythm; no murmurs Abd:      + bowel sounds; soft, non-tender; no palpable masses, no distension Ext:    No edema; adequate peripheral perfusion Skin:      Warm and dry; no rash Neuro: alert and oriented x 3 Psych: normal mood and affect   Data Reviewed: Imaging: Chest x-ray 08/05/2018- lungs are clear with no acute cardiopulmonary disease.  Chest x-ray 02/25/2021-no acute cardiopulmonary abnormality I have reviewed the images personally.  PFTs: 08/31/2011 FVC 5.30 [97%], FEV1 4.32 [99%], F/F 82, TLC 8.48 [99%], DLCO 31.5 [94%] Normal spirometry, normal lung volumes and diffusion capacity  Labs: CBC 08/05/2018-WBC 8.3, eos 0%  CBC 02/25/2021- WBC 6.3, eos 4.8%, blood eosinophil count 300 IgE 02/25/2021- 32  Sleep: Sleep study 03/10/2021-severe sleep apnea, AHI 69.2  Titration study 05/20/2021-trial CPAP therapy on 16 cm of water  CPAP download 07/27/2021-100% compliance, residual AHI 12.9  Assessment:  Moderate persistent asthma Has significant GERD which is contributing to presentation Symptoms improved with breo inhaler Chest x-ray is normal PFTs ordered at last visit but not done.  Will need to reorder  GERD Continue PPI for GERD.   Will ask him to get in touch with his GI to reschedule EGD  Severe sleep apnea On CPAP of 16 Download reviewed with 100% compliance.  AHI has improved but still has residual value of 13 I will refer him to one of our sleep colleagues for further evaluation  Plan/Recommendations: Breo 200 Reorder PFTs Continue CPAP.  Refer to sleep clinic  Marshell Garfinkel MD [facility] 07/29/2021, 3:59 PM  CC: Carollee Herter, Alferd Apa, *

## 2021-07-29 NOTE — Patient Instructions (Signed)
Glad your breathing has improved with the Breo inhaler Your sleep apnea has improved with the CPAP machine but not completely back to normal I would like you to see one of our sleep doctors for further evaluation I will also get in touch with your gastroenterologist as they need to reevaluate you for a scope  Follow-up with me in 6 months.

## 2021-07-29 NOTE — Progress Notes (Signed)
Subjective:    Patient ID: Michael Pearson, male    DOB: 09-24-1961, 60 y.o.   MRN: [id number]  HPI Pt returns for f/u of pituitary microprolactinoma (in 2005, pt was incidentally noted to have a pituitary adenoma; in 2017, elev. prolactin was noted; f/u MRI in 2020 showed adenoma was down to 7 mm 2021 shoed no change; h/o near-syncope limits bromocriptine dosage; other pituitary functions are normal).  No change in chronic lightheadedness or headache. Hyperglycemia was recently noted.   Past Medical History:  Diagnosis Date   Allergy    Arthritis    Asthma    CAD (coronary artery disease)    NSTEMI 7/12:  Cardiac cath on 7/17: pLAD occluded, Dx 30-40%, pRCA 30-40%, mRCA 30-40%.  Proximal LAD was treated with a BMS.  Echo 7/19:  EF 60-65%, mild LVH.    ETT-Myoview 6/14:  Inf thinning, no ischemia, EF 64%, normal study // Myoview 04/2020: EF 57, normal perfusion; Low Risk     Cognitive communication deficit    Colitis    Diverticulosis    Duodenitis    May 2012 with heme positive stools at this time. Endorses only rare streaking of blood now.   External hemorrhoids    GERD (gastroesophageal reflux disease)    Heart murmur    as a child per the pt   Hiatal hernia    Hx of hemorrhoids    Hyperlipidemia    Hypertension    Kidney stones    Microadenoma    MRI in 2008 demonstrating 4.6 mm area of pituitary gland    Migraines    Has been evaluated multiple times in past for chronic headache in which pt has had occasional nose bleeds and bloodshot eyes. This lasted for 4-5 months. CT of the head was negative in May 2012.   Myocardial infarction ([place]) 05/17/2011   Pituitary tumor    RBBB (right bundle branch block)    Noted on EKG in 2008   Ulcer     Past Surgical History:  Procedure Laterality Date   COLONOSCOPY     egd  03/05/2011   Dr. Benson Norway   FLEXIBLE SIGMOIDOSCOPY  03/05/2011   Dr. Benson Norway   HAMMER TOE SURGERY     Heart stent     [place]     In 20's   ORIF TIBIA PLATEAU Left  08/04/2018   Procedure: OPEN REDUCTION INTERNAL FIXATION (ORIF) TIBIAL PLATEAU;  Surgeon: Altamese Woodford, MD;  Location: [facility];  Service: Orthopedics;  Laterality: Left;   SIGMOIDOSCOPY     TOOTH EXTRACTION     UPPER GASTROINTESTINAL ENDOSCOPY      Social History   Socioeconomic History   Marital status: Single    Spouse name: Not on file   Number of children: Not on file   Years of education: Not on file   Highest education level: Not on file  Occupational History   Occupation: Personnel officer-- cleans 3 buildings    Comment: Fairly physical job    Employer: [employer]  Tobacco Use   Smoking status: Never   Smokeless tobacco: Never  Vaping Use   Vaping Use: Never used  Substance and Sexual Activity   Alcohol use: No   Drug use: No   Sexual activity: Not Currently  Other Topics Concern   Not on file  Social History Narrative   Lives with brother and mother   Mother is quite sick, he and his brother take turns taking care of her  No children   Social Determinants of Radio broadcast assistant Strain: Low Risk    Difficulty of Paying Living Expenses: Not hard at all  Food Insecurity: No Food Insecurity   Worried About Charity fundraiser in the Last Year: Never true   Ran Out of Food in the Last Year: Never true  Transportation Needs: No Transportation Needs   Lack of Transportation (Medical): No   Lack of Transportation (Non-Medical): No  Physical Activity: Sufficiently Active   Days of Exercise per Week: 7 days   Minutes of Exercise per Session: 30 min  Stress: No Stress Concern Present   Feeling of Stress : Not at all  Social Connections: Socially Isolated   Frequency of Communication with Friends and Family: More than three times a week   Frequency of Social Gatherings with Friends and Family: More than three times a week   Attends Religious Services: Never   Marine scientist or Organizations: No   Attends Music therapist: Never    Marital Status: Never married  Human resources officer Violence: Not At Risk   Fear of Current or Ex-Partner: No   Emotionally Abused: No   Physically Abused: No   Sexually Abused: No    Current Outpatient Medications on File Prior to Visit  Medication Sig Dispense Refill   acetaminophen (TYLENOL) 500 MG tablet Take 1 tablet (500 mg total) by mouth every 12 (twelve) hours. 60 tablet 0   albuterol (PROVENTIL HFA;VENTOLIN HFA) 108 (90 Base) MCG/ACT inhaler Inhale 2 puffs into the lungs every 4 (four) hours as needed for wheezing or shortness of breath. 1 Inhaler 2   aspirin 81 MG tablet Take 81 mg by mouth daily.     atorvastatin (LIPITOR) 40 MG tablet Take 1 tablet (40 mg total) by mouth daily. 90 tablet 1   cetirizine (ZYRTEC) 10 MG tablet Take 10 mg by mouth daily as needed for allergies.      diclofenac Sodium (VOLTAREN) 1 % GEL Apply 4 g topically 4 (four) times daily. 100 g 2   docusate sodium (COLACE) 100 MG capsule Take 1 capsule (100 mg total) by mouth 2 (two) times daily. 20 capsule 0   esomeprazole (NEXIUM) 40 MG capsule Take 1 capsule (40 mg total) by mouth 2 (two) times daily before a meal. 180 capsule 1   fluticasone furoate-vilanterol (BREO ELLIPTA) 200-25 MCG/INH AEPB Inhale 1 puff into the lungs daily. 60 each 2   HYDROcodone-acetaminophen (NORCO) 7.5-325 MG tablet Take 1-2 tablets by mouth every 6 (six) hours as needed for moderate pain or severe pain. 50 tablet 0   methocarbamol (ROBAXIN) 500 MG tablet Take 1 tablet (500 mg total) by mouth every 8 (eight) hours as needed for muscle spasms. 60 tablet 0   Multiple Vitamins-Minerals (MENS ONE DAILY PO) Take 1 tablet by mouth daily.     triamcinolone cream (KENALOG) 0.1 % Apply 1 application topically 2 (two) times daily. 30 g 2   mometasone (NASONEX) 50 MCG/ACT nasal spray Place 2 sprays into the nose daily. 17 g 2   No current facility-administered medications on file prior to visit.    No Known Allergies  Family History   Problem Relation Age of Onset   Stroke Mother    Heart attack Father 44       MI   Skin cancer Father    Cancer Father        ? type "rare"   Obesity Brother  Coronary artery disease Brother        Possible, pt not sure of specifics   Hypertension Brother    Colon polyps Maternal Aunt    Colon cancer Maternal Aunt    Colon polyps Paternal Grandmother    Other Neg Hx    Esophageal cancer Neg Hx    Rectal cancer Neg Hx    Stomach cancer Neg Hx     BP 140/84 (BP Location: Right Arm, Patient Position: Sitting, Cuff Size: Large)   Pulse 67   Ht 6\' 3"  (1.905 m)   Wt 287 lb 9.6 oz (130.5 kg)   SpO2 94%   BMI 35.95 kg/m   Review of Systems     Objective:   Physical Exam   Lab Results  Component Value Date   CREATININE 0.79 06/08/2021   BUN 12 06/08/2021   NA 137 06/08/2021   K 4.8 06/08/2021   CL 101 06/08/2021   CO2 28 06/08/2021   Lab Results  Component Value Date   HGBA1C 8.2 (H) 07/29/2021      Assessment & Plan:  Type 2 DM: uncontrolled. I have sent a prescription to your pharmacy, to start metformin Hyperprolactinemia: recheck today.

## 2021-07-30 MED ORDER — CABERGOLINE 0.5 MG PO TABS: 0.2500 mg | ORAL_TABLET | ORAL | 3 refills | Status: DC

## 2021-07-30 NOTE — Telephone Encounter (Signed)
Patient returned call and is scheduled 08/03/21 for PFT.  Nothing further at this time.

## 2021-08-03 ENCOUNTER — Telehealth: Payer: Self-pay

## 2021-08-03 ENCOUNTER — Other Ambulatory Visit: Payer: Self-pay

## 2021-08-03 ENCOUNTER — Ambulatory Visit (INDEPENDENT_AMBULATORY_CARE_PROVIDER_SITE_OTHER): Payer: Medicare Other | Admitting: Pulmonary Disease

## 2021-08-03 DIAGNOSIS — K219 Gastro-esophageal reflux disease without esophagitis: Secondary | ICD-10-CM

## 2021-08-03 DIAGNOSIS — R0602 Shortness of breath: Secondary | ICD-10-CM

## 2021-08-03 DIAGNOSIS — R1310 Dysphagia, unspecified: Secondary | ICD-10-CM

## 2021-08-03 LAB — PULMONARY FUNCTION TEST
DL/VA % pred: 114 %
DL/VA: 4.76 ml/min/mmHg/L
DLCO cor % pred: 116 %
DLCO cor: 36.15 ml/min/mmHg
DLCO unc % pred: 115 %
DLCO unc: 36.11 ml/min/mmHg
FEF 25-75 Pre: 4.45 L/sec
FEF2575-%Pred-Pre: 132 %
FEV1-%Pred-Pre: 102 %
FEV1-Pre: 4.22 L
FEV1FVC-%Pred-Pre: 105 %
FEV6-%Pred-Pre: 100 %
FEV6-Pre: 5.28 L
FEV6FVC-%Pred-Pre: 104 %
FVC-%Pred-Pre: 96 %
FVC-Pre: 5.28 L
Pre FEV1/FVC ratio: 80 %
Pre FEV6/FVC Ratio: 100 %
RV % pred: 179 %
RV: 4.42 L
TLC % pred: 119 %
TLC: 9.35 L

## 2021-08-03 NOTE — Progress Notes (Signed)
Patient not able to perform spirometry correctly. DLCO and Pleth performed.

## 2021-08-03 NOTE — Progress Notes (Signed)
Excellent investigation Beth, I agree completely. EGD to be schedule at [facility].  Thank you.

## 2021-08-03 NOTE — Telephone Encounter (Signed)
Contacted the patient and scheduled his EGD for 10/15/21 with Dr Hilarie Fredrickson at [facility]. He will check in at 7:45 am. Written instructions mailed to the patient.

## 2021-08-03 NOTE — Patient Instructions (Signed)
DLCO and Pleth performed.

## 2021-08-03 NOTE — Progress Notes (Signed)
Beth, pls contact the patient and schedule him for an EGD with possible esophageal dilatation with Dr. Hilarie Fredrickson if he is willing,  if he wishes to discuss this further then schedule him for a follow up office visit. Thx   Pulmonary clearance received, refer to office visit 12/2020

## 2021-08-27 ENCOUNTER — Encounter: Payer: Self-pay | Admitting: Pulmonary Disease

## 2021-08-27 ENCOUNTER — Other Ambulatory Visit: Payer: Self-pay

## 2021-08-27 ENCOUNTER — Ambulatory Visit (INDEPENDENT_AMBULATORY_CARE_PROVIDER_SITE_OTHER): Payer: Medicare Other | Admitting: Pulmonary Disease

## 2021-08-27 VITALS — BP 118/70 | HR 69 | Temp 97.8°F | Ht 74.0 in | Wt 288.0 lb

## 2021-08-27 DIAGNOSIS — G4733 Obstructive sleep apnea (adult) (pediatric): Secondary | ICD-10-CM | POA: Diagnosis not present

## 2021-08-27 NOTE — Patient Instructions (Addendum)
CPAP supplies  Continue using CPAP on a nightly basis  Call with significant concerns  I will see you in about 3 months

## 2021-08-27 NOTE — Progress Notes (Signed)
Michael Pearson    [id number]    January 18, 1961  Primary Care Physician:Lowne Cheri Rous Alferd Apa, DO  Referring Physician: Carollee Herter, Alferd Apa, [place] [address] [place],  [place] [address]  Chief complaint:   Patient with severe obstructive sleep apnea  HPI:  Severe obstructive sleep apnea diagnosed May 20, 2021 Has been using CPAP on a nightly basis  Feels he is sleeping better Functioning better  He has a lot of leg discomfort during the day and at night Symptoms not consistent with having restless legs, more consistent with neuropathic pain  Denies significant daytime sleepiness  Is still getting used to using CPAP on a nightly basis Has to pull the mask tight for her to fit well  Sleep onset is variable Awakening time is variable as well  Feels he sleeps well at night   Outpatient Encounter Medications as of 08/27/2021  Medication Sig   acetaminophen (TYLENOL) 500 MG tablet Take 1 tablet (500 mg total) by mouth every 12 (twelve) hours.   albuterol (PROVENTIL HFA;VENTOLIN HFA) 108 (90 Base) MCG/ACT inhaler Inhale 2 puffs into the lungs every 4 (four) hours as needed for wheezing or shortness of breath.   aspirin 81 MG tablet Take 81 mg by mouth daily.   atorvastatin (LIPITOR) 40 MG tablet Take 1 tablet (40 mg total) by mouth daily.   cabergoline (DOSTINEX) 0.5 MG tablet Take 0.5 tablets (0.25 mg total) by mouth 2 (two) times a week.   cetirizine (ZYRTEC) 10 MG tablet Take 10 mg by mouth daily as needed for allergies.    diclofenac Sodium (VOLTAREN) 1 % GEL Apply 4 g topically 4 (four) times daily.   docusate sodium (COLACE) 100 MG capsule Take 1 capsule (100 mg total) by mouth 2 (two) times daily.   esomeprazole (NEXIUM) 40 MG capsule Take 1 capsule (40 mg total) by mouth 2 (two) times daily before a meal.   ezetimibe (ZETIA) 10 MG tablet TAKE ONE TABLET BY MOUTH ONE TIME DAILY   fluticasone furoate-vilanterol (BREO ELLIPTA) 200-25 MCG/INH AEPB Inhale  1 puff into the lungs daily.   losartan (COZAAR) 50 MG tablet TAKE ONE TABLET BY MOUTH ONE TIME DAILY   metFORMIN (GLUCOPHAGE-XR) 500 MG 24 hr tablet Take 2 tablets (1,000 mg total) by mouth daily.   Multiple Vitamins-Minerals (MENS ONE DAILY PO) Take 1 tablet by mouth daily.   triamcinolone cream (KENALOG) 0.1 % Apply 1 application topically 2 (two) times daily.   [DISCONTINUED] HYDROcodone-acetaminophen (NORCO) 7.5-325 MG tablet Take 1-2 tablets by mouth every 6 (six) hours as needed for moderate pain or severe pain. (Patient not taking: Reported on 08/27/2021)   [DISCONTINUED] methocarbamol (ROBAXIN) 500 MG tablet Take 1 tablet (500 mg total) by mouth every 8 (eight) hours as needed for muscle spasms. (Patient not taking: Reported on 08/27/2021)   [DISCONTINUED] mometasone (NASONEX) 50 MCG/ACT nasal spray Place 2 sprays into the nose daily.   No facility-administered encounter medications on file as of 08/27/2021.    Allergies as of 08/27/2021   (No Known Allergies)    Past Medical History:  Diagnosis Date   Allergy    Arthritis    Asthma    CAD (coronary artery disease)    NSTEMI 7/12:  Cardiac cath on 7/17: pLAD occluded, Dx 30-40%, pRCA 30-40%, mRCA 30-40%.  Proximal LAD was treated with a BMS.  Echo 7/19:  EF 60-65%, mild LVH.    ETT-Myoview 6/14:  Inf thinning, no ischemia, EF 64%, normal study // Myoview 04/2020: EF 57, normal perfusion; Low Risk     Cognitive communication deficit    Colitis    Diverticulosis    Duodenitis    May 2012 with heme positive stools at this time. Endorses only rare streaking of blood now.   External hemorrhoids    GERD (gastroesophageal reflux disease)    Heart murmur    as a child per the pt   Hiatal hernia    Hx of hemorrhoids    Hyperlipidemia    Hypertension    Kidney stones    Microadenoma    MRI in 2008 demonstrating 4.6 mm area of pituitary gland    Migraines    Has been evaluated multiple times in past for chronic headache in which  pt has had occasional nose bleeds and bloodshot eyes. This lasted for 4-5 months. CT of the head was negative in May 2012.   Myocardial infarction ([place]) 05/17/2011   Pituitary tumor    RBBB (right bundle branch block)    Noted on EKG in 2008   Ulcer     Past Surgical History:  Procedure Laterality Date   COLONOSCOPY     egd  03/05/2011   Dr. Benson Norway   FLEXIBLE SIGMOIDOSCOPY  03/05/2011   Dr. Benson Norway   HAMMER TOE SURGERY     Heart stent     [place]     In 20's   ORIF TIBIA PLATEAU Left 08/04/2018   Procedure: OPEN REDUCTION INTERNAL FIXATION (ORIF) TIBIAL PLATEAU;  Surgeon: Altamese Grottoes, MD;  Location: [facility];  Service: Orthopedics;  Laterality: Left;   SIGMOIDOSCOPY     TOOTH EXTRACTION     UPPER GASTROINTESTINAL ENDOSCOPY      Family History  Problem Relation Age of Onset   Stroke Mother    Heart attack Father 38       MI   Skin cancer Father    Cancer Father        ? type "rare"   Obesity Brother    Coronary artery disease Brother        Possible, pt not sure of specifics   Hypertension Brother    Colon polyps Maternal Aunt    Colon cancer Maternal Aunt    Colon polyps Paternal Grandmother    Other Neg Hx    Esophageal cancer Neg Hx    Rectal cancer Neg Hx    Stomach cancer Neg Hx     Social History   Socioeconomic History   Marital status: Single    Spouse name: Not on file   Number of children: Not on file   Years of education: Not on file   Highest education level: Not on file  Occupational History   Occupation: Personnel officer-- cleans 3 buildings    Comment: Fairly physical job    Employer: [employer]  Tobacco Use   Smoking status: Never   Smokeless tobacco: Never  Vaping Use   Vaping Use: Never used  Substance and Sexual Activity   Alcohol use: No   Drug use: No   Sexual activity: Not Currently  Other Topics Concern   Not on file  Social History Narrative   Lives with brother and mother   Mother is quite sick, he and his brother take  turns taking care of her   No children   Social Determinants of Health   Financial Resource Strain: Low Risk    Difficulty of Paying Living  Expenses: Not hard at all  Food Insecurity: No Food Insecurity   Worried About Charity fundraiser in the Last Year: Never true   Ran Out of Food in the Last Year: Never true  Transportation Needs: No Transportation Needs   Lack of Transportation (Medical): No   Lack of Transportation (Non-Medical): No  Physical Activity: Sufficiently Active   Days of Exercise per Week: 7 days   Minutes of Exercise per Session: 30 min  Stress: No Stress Concern Present   Feeling of Stress : Not at all  Social Connections: Socially Isolated   Frequency of Communication with Friends and Family: More than three times a week   Frequency of Social Gatherings with Friends and Family: More than three times a week   Attends Religious Services: Never   Marine scientist or Organizations: No   Attends Music therapist: Never   Marital Status: Never married  Human resources officer Violence: Not At Risk   Fear of Current or Ex-Partner: No   Emotionally Abused: No   Physically Abused: No   Sexually Abused: No    Review of Systems  Constitutional:  Negative for fatigue.  Respiratory:  Positive for apnea and shortness of breath.   Psychiatric/Behavioral:  Positive for sleep disturbance.    Vitals:   08/27/21 0952  BP: 118/70  Pulse: 69  Temp: 97.8 F (36.6 C)  SpO2: 96%     Physical Exam Constitutional:      Appearance: He is obese.  HENT:     Right Ear: Tympanic membrane normal.     Mouth/Throat:     Mouth: Mucous membranes are moist.     Comments: Micrognathia, crowded oropharynx, Mallampati 4 Cardiovascular:     Rate and Rhythm: Normal rate and regular rhythm.     Heart sounds: No murmur heard.   No friction rub.  Pulmonary:     Effort: No respiratory distress.     Breath sounds: No stridor. No wheezing or rhonchi.  Musculoskeletal:      Cervical back: No rigidity or tenderness.  Neurological:     Mental Status: He is alert.     Cranial Nerves: No cranial nerve deficit.  Psychiatric:        Mood and Affect: Mood normal.   Data Reviewed: Recent sleep study reviewed showing severe obstructive sleep apnea  Compliance report shows 100% compliance with CPAP Average use of 9 hours 6 minutes Pressure of 16 Does have some mask leaks AHI elevated at 17.4  Assessment:  Severe obstructive sleep apnea  Compliant with CPAP use  Elevated AHI was related to mask leaks  Plan/Recommendations: CPAP supplies -Prescription will be sent to medical supply company  Encouraged to continue using CPAP on a regular basis  Weight loss efforts encouraged  Tentative follow-up in 3 months   Sherrilyn Rist MD [facility] 08/27/2021, 10:26 AM  CC: [name], *

## 2021-09-17 ENCOUNTER — Other Ambulatory Visit: Payer: Self-pay

## 2021-09-17 ENCOUNTER — Other Ambulatory Visit (INDEPENDENT_AMBULATORY_CARE_PROVIDER_SITE_OTHER): Payer: Medicare Other

## 2021-09-17 DIAGNOSIS — E875 Hyperkalemia: Secondary | ICD-10-CM

## 2021-09-17 DIAGNOSIS — E1169 Type 2 diabetes mellitus with other specified complication: Secondary | ICD-10-CM

## 2021-09-17 DIAGNOSIS — R7989 Other specified abnormal findings of blood chemistry: Secondary | ICD-10-CM | POA: Diagnosis not present

## 2021-09-17 DIAGNOSIS — E1165 Type 2 diabetes mellitus with hyperglycemia: Secondary | ICD-10-CM

## 2021-09-17 DIAGNOSIS — E785 Hyperlipidemia, unspecified: Secondary | ICD-10-CM | POA: Diagnosis not present

## 2021-09-17 LAB — COMPREHENSIVE METABOLIC PANEL
ALT: 66 U/L — ABNORMAL HIGH (ref 0–53)
AST: 39 U/L — ABNORMAL HIGH (ref 0–37)
Albumin: 4.4 g/dL (ref 3.5–5.2)
Alkaline Phosphatase: 56 U/L (ref 39–117)
BUN: 14 mg/dL (ref 6–23)
CO2: 32 mEq/L (ref 19–32)
Calcium: 9.4 mg/dL (ref 8.4–10.5)
Chloride: 102 mEq/L (ref 96–112)
Creatinine, Ser: 0.94 mg/dL (ref 0.40–1.50)
GFR: 87.98 mL/min (ref >60.00)
Glucose, Bld: 114 mg/dL — ABNORMAL HIGH (ref 70–99)
Potassium: 4.4 mEq/L (ref 3.5–5.1)
Sodium: 139 mEq/L (ref 135–145)
Total Bilirubin: 0.7 mg/dL (ref 0.2–1.2)
Total Protein: 6.8 g/dL (ref 6.0–8.3)

## 2021-09-17 LAB — LIPID PANEL
Cholesterol: 128 mg/dL (ref 0–200)
HDL: 60.9 mg/dL (ref >39.00)
LDL Cholesterol: 57 mg/dL (ref 0–99)
NonHDL: 67.41
Total CHOL/HDL Ratio: 2
Triglycerides: 52 mg/dL (ref 0.0–149.0)
VLDL: 10.4 mg/dL (ref 0.0–40.0)

## 2021-09-19 ENCOUNTER — Other Ambulatory Visit: Payer: Self-pay | Admitting: Family Medicine

## 2021-09-19 DIAGNOSIS — E785 Hyperlipidemia, unspecified: Secondary | ICD-10-CM

## 2021-09-19 DIAGNOSIS — R739 Hyperglycemia, unspecified: Secondary | ICD-10-CM

## 2021-10-05 ENCOUNTER — Encounter (HOSPITAL_COMMUNITY): Payer: Self-pay | Admitting: Internal Medicine

## 2021-10-15 ENCOUNTER — Ambulatory Visit (HOSPITAL_COMMUNITY): Payer: Medicare Other | Admitting: Anesthesiology

## 2021-10-15 ENCOUNTER — Encounter (HOSPITAL_COMMUNITY): Payer: Self-pay | Admitting: Internal Medicine

## 2021-10-15 ENCOUNTER — Encounter (HOSPITAL_COMMUNITY)
Admission: RE | Disposition: A | Discharge status: Home / Self Care | Payer: Self-pay | Source: Home / Self Care | Attending: Internal Medicine

## 2021-10-15 ENCOUNTER — Other Ambulatory Visit: Payer: Self-pay

## 2021-10-15 ENCOUNTER — Ambulatory Visit (HOSPITAL_COMMUNITY)
Admission: RE | Admit: 2021-10-15 | Discharge: 2021-10-15 | Disposition: A | Discharge status: Home / Self Care | Payer: Medicare Other | Attending: Internal Medicine | Admitting: Internal Medicine

## 2021-10-15 DIAGNOSIS — I10 Essential (primary) hypertension: Secondary | ICD-10-CM | POA: Insufficient documentation

## 2021-10-15 DIAGNOSIS — I2510 Atherosclerotic heart disease of native coronary artery without angina pectoris: Secondary | ICD-10-CM | POA: Insufficient documentation

## 2021-10-15 DIAGNOSIS — M1990 Unspecified osteoarthritis, unspecified site: Secondary | ICD-10-CM | POA: Insufficient documentation

## 2021-10-15 DIAGNOSIS — J45909 Unspecified asthma, uncomplicated: Secondary | ICD-10-CM | POA: Insufficient documentation

## 2021-10-15 DIAGNOSIS — K219 Gastro-esophageal reflux disease without esophagitis: Secondary | ICD-10-CM | POA: Diagnosis not present

## 2021-10-15 DIAGNOSIS — K449 Diaphragmatic hernia without obstruction or gangrene: Secondary | ICD-10-CM | POA: Diagnosis not present

## 2021-10-15 DIAGNOSIS — I252 Old myocardial infarction: Secondary | ICD-10-CM | POA: Insufficient documentation

## 2021-10-15 DIAGNOSIS — K3189 Other diseases of stomach and duodenum: Secondary | ICD-10-CM | POA: Diagnosis not present

## 2021-10-15 DIAGNOSIS — R1310 Dysphagia, unspecified: Secondary | ICD-10-CM | POA: Diagnosis present

## 2021-10-15 DIAGNOSIS — K2970 Gastritis, unspecified, without bleeding: Secondary | ICD-10-CM

## 2021-10-15 HISTORY — PX: BIOPSY: SHX5522

## 2021-10-15 HISTORY — PX: MALONEY DILATION: SHX5535

## 2021-10-15 HISTORY — PX: ESOPHAGOGASTRODUODENOSCOPY (EGD) WITH PROPOFOL: SHX5813

## 2021-10-15 SURGERY — ESOPHAGOGASTRODUODENOSCOPY (EGD) WITH PROPOFOL
Anesthesia: Monitor Anesthesia Care

## 2021-10-15 MED ORDER — LIDOCAINE HCL (CARDIAC) PF 100 MG/5ML IV SOSY
PREFILLED_SYRINGE | INTRAVENOUS | Status: DC | PRN
  Administered 2021-10-15: 100 mg via INTRAVENOUS

## 2021-10-15 MED ORDER — PROPOFOL 500 MG/50ML IV EMUL
INTRAVENOUS | Status: DC | PRN
  Administered 2021-10-15: 120 ug/kg/min via INTRAVENOUS

## 2021-10-15 MED ORDER — LACTATED RINGERS IV SOLN: INTRAVENOUS | Status: DC

## 2021-10-15 MED ORDER — SODIUM CHLORIDE 0.9 % IV SOLN: INTRAVENOUS | Status: DC

## 2021-10-15 MED ORDER — PROPOFOL 10 MG/ML IV BOLUS
INTRAVENOUS | Status: DC | PRN
  Administered 2021-10-15 (×2): 20 mg via INTRAVENOUS
  Administered 2021-10-15: 30 mg via INTRAVENOUS

## 2021-10-15 SURGICAL SUPPLY — 15 items

## 2021-10-15 NOTE — Transfer of Care (Signed)
Immediate Anesthesia Transfer of Care Note  Patient: Michael Pearson  Procedure(s) Performed: ESOPHAGOGASTRODUODENOSCOPY (EGD) WITH PROPOFOL MALONEY DILATION  Patient Location: Endoscopy Unit  Anesthesia Type:MAC  Level of Consciousness: awake, alert , oriented, drowsy and patient cooperative  Airway & Oxygen Therapy: Patient Spontanous Breathing and Patient connected to face mask oxygen  Post-op Assessment: Report given to RN and Post -op Vital signs reviewed and stable  Post vital signs: Reviewed and stable  Last Vitals:  Vitals Value Taken Time  BP 106/49 10/15/21 1000  Temp    Pulse 56 10/15/21 1001  Resp 23 10/15/21 1001  SpO2 100 % 10/15/21 1001  Vitals shown include unvalidated device data.  Last Pain:  Vitals:   10/15/21 0850  TempSrc: Oral  PainSc: 5          Complications: No notable events documented.

## 2021-10-15 NOTE — Anesthesia Preprocedure Evaluation (Addendum)
Anesthesia Evaluation  Patient identified by MRN, date of birth, ID band Patient awake    Reviewed: Allergy & Precautions, NPO status , Patient's Chart, lab work & pertinent test results  Airway Mallampati: IV  TM Distance: <3 FB Neck ROM: Full  Mouth opening: Limited Mouth Opening  Dental no notable dental hx. (+) Teeth Intact, Poor Dentition, Missing, Dental Advisory Given   Pulmonary neg pulmonary ROS, asthma ,    Pulmonary exam normal breath sounds clear to auscultation       Cardiovascular hypertension, Pt. on medications and Pt. on home beta blockers + CAD and + Past MI  negative cardio ROS Normal cardiovascular exam Rhythm:Regular Rate:Normal  NSTEMI 7/12:  Cardiac cath on 7/17: pLAD occluded, Dx 30-40%, pRCA 30-40%, mRCA 30-40%.  Proximal LAD was treated with a BMS.  Echo 7/19:  EF 60-65%, mild LVH.    ETT-Myoview 6/14:  Inf thinning, no ischemia, EF 64%, normal study // Myoview 04/2020: EF 57, normal perfusion; Low Risk     Neuro/Psych  Headaches, negative neurological ROS  negative psych ROS   GI/Hepatic negative GI ROS, Neg liver ROS, GERD  ,  Endo/Other  negative endocrine ROS  Renal/GU negative Renal ROS  negative genitourinary   Musculoskeletal negative musculoskeletal ROS (+) Arthritis , Osteoarthritis,    Abdominal (+) + obese,   Peds negative pediatric ROS (+)  Hematology negative hematology ROS (+)   Anesthesia Other Findings   Reproductive/Obstetrics negative OB ROS                           Anesthesia Physical  Anesthesia Plan  ASA: 3  Anesthesia Plan: MAC   Post-op Pain Management: Minimal or no pain anticipated   Induction: Intravenous  PONV Risk Score and Plan: 2 and Propofol infusion  Airway Management Planned: Nasal Cannula and Natural Airway  Additional Equipment: None  Intra-op Plan:   Post-operative Plan: Extubation in OR  Informed Consent: I have  reviewed the patients History and Physical, chart, labs and discussed the procedure including the risks, benefits and alternatives for the proposed anesthesia with the patient or authorized representative who has indicated his/her understanding and acceptance.     Dental advisory given  Plan Discussed with: CRNA and Anesthesiologist  Anesthesia Plan Comments:         Anesthesia Quick Evaluation

## 2021-10-15 NOTE — Anesthesia Postprocedure Evaluation (Signed)
Anesthesia Post Note  Patient: Michael Pearson  Procedure(s) Performed: ESOPHAGOGASTRODUODENOSCOPY (EGD) WITH PROPOFOL [place]     Patient location during evaluation: PACU Anesthesia Type: MAC Level of consciousness: awake and alert Pain management: pain level controlled Vital Signs Assessment: post-procedure vital signs reviewed and stable Respiratory status: spontaneous breathing, nonlabored ventilation, respiratory function stable and patient connected to nasal cannula oxygen Cardiovascular status: stable and blood pressure returned to baseline Postop Assessment: no apparent nausea or vomiting Anesthetic complications: no   No notable events documented.  Last Vitals:  Vitals:   10/15/21 1000 10/15/21 1002  BP:  (!) 106/49  Pulse: (!) 58 (!) 56  Resp: (!) 23 (!) 21  Temp: 36.9 C   SpO2: 100% 100%    Last Pain:  Vitals:   10/15/21 1002  TempSrc:   PainSc: 0-No pain                 Keawe Marcello

## 2021-10-15 NOTE — H&P (Signed)
GASTROENTEROLOGY PROCEDURE H&P NOTE   Primary Care Physician: [name], DO    Reason for Procedure:  History of GERD, dysphagia symptom intermittent, question of hematemesis much earlier in the year with known recent  Plan:    Upper endoscopy with probable dilation    The nature of the procedure, as well as the risks, benefits, and alternatives were carefully and thoroughly reviewed with the patient. Ample time for discussion and questions allowed. The patient understood, was satisfied, and agreed to proceed.     HPI: Michael Pearson is a 60 y.o. male who presents for EGD to evaluate the above symptoms.  Medical history as below.  Seen in the office in March 2022.  Reports intermittent solid food dysphagia.  Heartburn seems controlled of late.  No abdominal pain, chest pain or shortness of breath today.  He does note that after eating on most days he has significant abdominal bloating which can be uncomfortable.  Bowel movements have been regular.  Past Medical History:  Diagnosis Date   Allergy    Arthritis    Asthma    CAD (coronary artery disease)    NSTEMI 7/12:  Cardiac cath on 7/17: pLAD occluded, Dx 30-40%, pRCA 30-40%, mRCA 30-40%.  Proximal LAD was treated with a BMS.  Echo 7/19:  EF 60-65%, mild LVH.    ETT-Myoview 6/14:  Inf thinning, no ischemia, EF 64%, normal study // Myoview 04/2020: EF 57, normal perfusion; Low Risk     Cognitive communication deficit    Colitis    Diverticulosis    Duodenitis    May 2012 with heme positive stools at this time. Endorses only rare streaking of blood now.   External hemorrhoids    GERD (gastroesophageal reflux disease)    Heart murmur    as a child per the pt   Hiatal hernia    Hx of hemorrhoids    Hyperlipidemia    Hypertension    Kidney stones    Microadenoma    MRI in 2008 demonstrating 4.6 mm area of pituitary gland    Migraines    Has been evaluated multiple times in past for chronic headache in which pt  has had occasional nose bleeds and bloodshot eyes. This lasted for 4-5 months. CT of the head was negative in May 2012.   Myocardial infarction ([place]) 05/17/2011   Pituitary tumor    RBBB (right bundle branch block)    Noted on EKG in 2008   Ulcer     Past Surgical History:  Procedure Laterality Date   COLONOSCOPY     egd  03/05/2011   Dr. Benson [place]   FLEXIBLE SIGMOIDOSCOPY  03/05/2011   Dr. Benson [place]   HAMMER TOE SURGERY     Heart stent     [place]     In 20's   ORIF TIBIA PLATEAU Left 08/04/2018   Procedure: OPEN REDUCTION INTERNAL FIXATION (ORIF) TIBIAL PLATEAU;  Surgeon: Altamese Baidland, MD;  Location: [facility];  Service: Orthopedics;  Laterality: Left;   SIGMOIDOSCOPY     TOOTH EXTRACTION     UPPER GASTROINTESTINAL ENDOSCOPY      Prior to Admission medications   Medication Sig Start Date End Date Taking? Authorizing Provider  albuterol (PROVENTIL HFA;VENTOLIN HFA) 108 (90 Base) MCG/ACT inhaler Inhale 2 puffs into the lungs every 4 (four) hours as needed for wheezing or shortness of breath. 11/24/17 08/12/22 Yes Lowne Koren Shiver, DO  aspirin EC 81 MG tablet Take  81 mg by mouth in the morning. Swallow whole.   Yes [provider]  atorvastatin (LIPITOR) 40 MG tablet Take 1 tablet (40 mg total) by mouth daily. 04/09/21  Yes Roma Schanz R, DO  cetirizine (ZYRTEC) 10 MG tablet Take 10 mg by mouth daily as needed for allergies.    Yes [provider]  docusate sodium (COLACE) 100 MG capsule Take 100 mg by mouth 2 (two) times daily as needed (constipation.).   Yes [provider]  esomeprazole (NEXIUM) 40 MG capsule Take 1 capsule (40 mg total) by mouth 2 (two) times daily before a meal. 06/08/21  Yes Lowne Chase, Yvonne R, DO  fluticasone furoate-vilanterol (BREO ELLIPTA) 200-25 MCG/INH AEPB Inhale 1 puff into the lungs daily. Patient taking differently: Inhale 1 puff into the lungs daily as needed (respiratory issues.). 02/25/21  Yes Mannam, Praveen, MD   losartan (COZAAR) 50 MG tablet TAKE ONE TABLET BY MOUTH ONE TIME DAILY 07/29/21  Yes Roma Schanz R, DO  metFORMIN (GLUCOPHAGE-XR) 500 MG 24 hr tablet Take 2 tablets (1,000 mg total) by mouth daily. Patient taking differently: Take 500 mg by mouth daily. 07/29/21  Yes Renato Shin, MD  Multiple Vitamin (MULTIVITAMIN WITH MINERALS) TABS tablet Take 1 tablet by mouth in the morning.   Yes [provider]  triamcinolone cream (KENALOG) 0.1 % Apply 1 application topically 2 (two) times daily. Patient taking differently: Apply 1 application topically 2 (two) times daily as needed (skin irritation/skin folds). 05/29/19  Yes Roma Schanz R, DO  cabergoline (DOSTINEX) 0.5 MG tablet Take 0.5 tablets (0.25 mg total) by mouth 2 (two) times a week. Patient not taking: Reported on 10/08/2021 07/30/21   Renato Shin, MD  diclofenac Sodium (VOLTAREN) 1 % GEL Apply 4 g topically 4 (four) times daily. Patient not taking: Reported on 10/08/2021 12/09/20   Carollee Herter, Kendrick Fries R, DO  ezetimibe (ZETIA) 10 MG tablet TAKE ONE TABLET BY MOUTH ONE TIME DAILY Patient not taking: Reported on 10/08/2021 07/29/21   [name], DO    Current Facility-Administered Medications  Medication Dose Route Frequency Provider Last Rate Last Admin   0.9 %  sodium chloride infusion   Intravenous Continuous Izaah Westman, Lajuan Lines, MD       lactated ringers infusion   Intravenous Continuous Shawnelle Spoerl, Lajuan Lines, MD 20 mL/hr at 10/15/21 0916 New Bag at 10/15/21 0916    Allergies as of 08/03/2021   (No Known Allergies)    Family History  Problem Relation Age of Onset   Stroke Mother    Heart attack Father 74       MI   Skin cancer Father    Cancer Father        ? type "rare"   Obesity Brother    Coronary artery disease Brother        Possible, pt not sure of specifics   Hypertension Brother    Colon polyps Maternal Aunt    Colon cancer Maternal Aunt    Colon polyps Paternal Grandmother    Other Neg Hx     Esophageal cancer Neg Hx    Rectal cancer Neg Hx    Stomach cancer Neg Hx     Social History   Socioeconomic History   Marital status: Single    Spouse name: Not on file   Number of children: Not on file   Years of education: Not on file   Highest education level: Not on file  Occupational History  Occupation: Personnel officer-- cleans 3 buildings    Comment: Fairly physical job    Employer: [employer]  Tobacco Use   Smoking status: Never   Smokeless tobacco: Never  Vaping Use   Vaping Use: Never used  Substance and Sexual Activity   Alcohol use: No   Drug use: No   Sexual activity: Not Currently  Other Topics Concern   Not on file  Social History Narrative   Lives with brother and mother   Mother is quite sick, he and his brother take turns taking care of her   No children   Social Determinants of Health   Financial Resource Strain: Low Risk    Difficulty of Paying Living Expenses: Not hard at all  Food Insecurity: No Food Insecurity   Worried About Charity fundraiser in the Last Year: Never true   [place] in the Last Year: Never true  Transportation Needs: No Transportation Needs   Lack of Transportation (Medical): No   Lack of Transportation (Non-Medical): No  Physical Activity: Sufficiently Active   Days of Exercise per Week: 7 days   Minutes of Exercise per Session: 30 min  Stress: No Stress Concern Present   Feeling of Stress : Not at all  Social Connections: Socially Isolated   Frequency of Communication with Friends and Family: More than three times a week   Frequency of Social Gatherings with Friends and Family: More than three times a week   Attends Religious Services: Never   Marine scientist or Organizations: No   Attends Music therapist: Never   Marital Status: Never married  Human resources officer Violence: Not At Risk   Fear of Current or Ex-Partner: No   Emotionally Abused: No   Physically Abused: No   Sexually  Abused: No    Physical Exam: Vital signs in last 24 hours: @BP  (!) 141/74    Pulse 72    Temp 98.4 F (36.9 C) (Oral)    Resp (!) 23    Ht 6\' 2"  (1.88 m)    Wt 129.3 kg    SpO2 98%    BMI 36.59 kg/m  GEN: NAD EYE: Sclerae anicteric ENT: MMM CV: Non-tachycardic Pulm: CTA b/l GI: Soft, NT/ND NEURO:  Alert & Oriented x 3   Zenovia Jarred, MD [facility]  10/15/2021 9:28 AM

## 2021-10-15 NOTE — Op Note (Signed)
[facility] Patient Name: Michael Pearson Procedure Date: 10/15/2021 MRN: [id number] Attending MD: Jerene Bears , MD Date of Birth: 01-Jul-1961 CSN: [id number] Age: 60 Admit Type: Outpatient Procedure:                Upper GI endoscopy Indications:              Dysphagia, Gastro-esophageal reflux disease,                            question of hematemesis much early this year; prior                            EGD with dilation in April 2017 for dysphagia                            symptom Providers:                Lajuan Lines. Hilarie Fredrickson, MD, Ervin Knack, RN, Tyna Jaksch                            Technician, Lodema Hong Technician, Technician Referring MD:             Rosalita Chessman Medicines:                Monitored Anesthesia Care Complications:            No immediate complications. Estimated Blood Loss:     Estimated blood loss was minimal. Procedure:                Pre-Anesthesia Assessment:                           - Prior to the procedure, a History and Physical                            was performed, and patient medications and                            allergies were reviewed. The patient's tolerance of                            previous anesthesia was also reviewed. The risks                            and benefits of the procedure and the sedation                            options and risks were discussed with the patient.                            All questions were answered, and informed consent                            was obtained. Prior Anticoagulants: The patient has  taken no previous anticoagulant or antiplatelet                            agents. ASA Grade Assessment: III - A patient with                            severe systemic disease. After reviewing the risks                            and benefits, the patient was deemed in                            satisfactory condition to undergo the procedure.                            After obtaining informed consent, the endoscope was                            passed under direct vision. Throughout the                            procedure, the patient's blood pressure, pulse, and                            oxygen saturations were monitored continuously. The                            [id number]-H190 ([id number]) Olympus endoscope was introduced                            through the mouth, and advanced to the second part                            of duodenum. The upper GI endoscopy was                            accomplished without difficulty. The patient                            tolerated the procedure well. Scope In: Scope Out: Findings:      No endoscopic abnormality was evident in the esophagus to explain the       patient's complaint of dysphagia. It was decided, however, to proceed       with dilation of the entire esophagus. The scope was withdrawn. Dilation       was performed with a Maloney dilator with mild resistance at 52 Fr.      Z-line is regular at 40 cm, 1 cm hiatal hernia was present.      The gastroesophageal flap valve was visualized endoscopically and       classified as Hill Grade II (fold present, opens with respiration).      Mildly erythematous mucosa without bleeding was found in the cardia       (query reflux related) and in the gastric antrum. Biopsies were taken       with  a cold forceps for histology and Helicobacter pylori testing.      The examined duodenum was normal. Impression:               - No endoscopic esophageal abnormality to explain                            patient's dysphagia. Esophagus dilated with 52 Fr.                            Maloney.                           - 1 cm hiatal hernia.                           - Erythematous mucosa in the cardia and antrum.                            Biopsied.                           - Normal examined duodenum. Moderate Sedation:      N/A Recommendation:           - Patient  has a contact number available for                            emergencies. The signs and symptoms of potential                            delayed complications were discussed with the                            patient. Return to normal activities tomorrow.                            Written discharge instructions were provided to the                            patient.                           - Resume previous diet.                           - Continue present medications.                           - Await pathology results. Procedure Code(s):        --- Professional ---                           [phone], Esophagogastroduodenoscopy, flexible,                            transoral; with biopsy, single or multiple  43450, Dilation of esophagus, by unguided sound or                            bougie, single or multiple passes Diagnosis Code(s):        --- Professional ---                           R13.10, Dysphagia, unspecified                           K44.9, Diaphragmatic hernia without obstruction or                            gangrene                           K31.89, Other diseases of stomach and duodenum                           K21.9, Gastro-esophageal reflux disease without                            esophagitis CPT copyright 2019 [employer]. All rights reserved. The codes documented in this report are preliminary and upon coder review may  be revised to meet current compliance requirements. Jerene Bears, MD 10/15/2021 10:05:50 AM This report has been signed electronically. Number of Addenda: 0

## 2021-10-16 LAB — SURGICAL PATHOLOGY

## 2021-10-19 ENCOUNTER — Encounter: Payer: Self-pay | Admitting: Internal Medicine

## 2021-11-18 ENCOUNTER — Encounter: Payer: Self-pay | Admitting: Pulmonary Disease

## 2021-11-18 ENCOUNTER — Ambulatory Visit: Payer: Medicare HMO | Admitting: Pulmonary Disease

## 2021-11-18 ENCOUNTER — Other Ambulatory Visit: Payer: Self-pay

## 2021-11-18 VITALS — BP 126/78 | HR 85 | Temp 97.5°F | Ht 74.0 in | Wt 295.0 lb

## 2021-11-18 DIAGNOSIS — G4733 Obstructive sleep apnea (adult) (pediatric): Secondary | ICD-10-CM

## 2021-11-18 DIAGNOSIS — Z9989 Dependence on other enabling machines and devices: Secondary | ICD-10-CM

## 2021-11-18 NOTE — Progress Notes (Signed)
Michael Pearson    [id number]    01-22-1961  Primary Care Physician:Lowne Cheri Rous Alferd Apa, DO  Referring Physician: Carollee Herter, Alferd Apa, [place] [address] [place],   [id number]  Chief complaint:   Patient with severe obstructive sleep apnea  HPI:  Severe obstructive sleep apnea diagnosed May 20, 2021 Has been using CPAP on a nightly basis Tolerating CPAP well Benefiting from CPAP  Functioning better Sleep sleeps well with CPAP   he does suffer from neuropathic pain He has a lot of leg discomfort during the day and at night Symptoms not consistent with having restless legs, more consistent with neuropathic pain  Denies significant daytime sleepiness  Is still getting used to using CPAP on a nightly basis  Sleep onset is variable Awakening time is variable as well  Feels he sleeps well at night   Outpatient Encounter Medications as of 11/18/2021  Medication Sig   albuterol (PROVENTIL HFA;VENTOLIN HFA) 108 (90 Base) MCG/ACT inhaler Inhale 2 puffs into the lungs every 4 (four) hours as needed for wheezing or shortness of breath.   aspirin EC 81 MG tablet Take 81 mg by mouth in the morning. Swallow whole.   atorvastatin (LIPITOR) 40 MG tablet Take 1 tablet (40 mg total) by mouth daily.   cabergoline (DOSTINEX) 0.5 MG tablet Take 0.5 tablets (0.25 mg total) by mouth 2 (two) times a week.   cetirizine (ZYRTEC) 10 MG tablet Take 10 mg by mouth daily as needed for allergies.    docusate sodium (COLACE) 100 MG capsule Take 100 mg by mouth 2 (two) times daily as needed (constipation.).   esomeprazole (NEXIUM) 40 MG capsule Take 1 capsule (40 mg total) by mouth 2 (two) times daily before a meal.   ezetimibe (ZETIA) 10 MG tablet TAKE ONE TABLET BY MOUTH ONE TIME DAILY   fluticasone furoate-vilanterol (BREO ELLIPTA) 200-25 MCG/INH AEPB Inhale 1 puff into the lungs daily. (Patient taking differently: Inhale 1 puff into the lungs daily as needed (respiratory  issues.).)   losartan (COZAAR) 50 MG tablet TAKE ONE TABLET BY MOUTH ONE TIME DAILY   metFORMIN (GLUCOPHAGE-XR) 500 MG 24 hr tablet Take 2 tablets (1,000 mg total) by mouth daily. (Patient taking differently: Take 500 mg by mouth daily.)   Multiple Vitamin (MULTIVITAMIN WITH MINERALS) TABS tablet Take 1 tablet by mouth in the morning.   triamcinolone cream (KENALOG) 0.1 % Apply 1 application topically 2 (two) times daily. (Patient taking differently: Apply 1 application topically 2 (two) times daily as needed (skin irritation/skin folds).)   No facility-administered encounter medications on file as of 11/18/2021.    Allergies as of 11/18/2021   (No Known Allergies)    Past Medical History:  Diagnosis Date   Allergy    Arthritis    Asthma    CAD (coronary artery disease)    NSTEMI 7/12:  Cardiac cath on 7/17: pLAD occluded, Dx 30-40%, pRCA 30-40%, mRCA 30-40%.  Proximal LAD was treated with a BMS.  Echo 7/19:  EF 60-65%, mild LVH.    ETT-Myoview 6/14:  Inf thinning, no ischemia, EF 64%, normal study // Myoview 04/2020: EF 57, normal perfusion; Low Risk     Cognitive communication deficit    Colitis    Diverticulosis    Duodenitis    May 2012 with heme positive stools at this time. Endorses only rare streaking of blood now.   External hemorrhoids    GERD (gastroesophageal  reflux disease)    Heart murmur    as a child per the pt   Hiatal hernia    Hx of hemorrhoids    Hyperlipidemia    Hypertension    Kidney stones    Microadenoma    MRI in 2008 demonstrating 4.6 mm area of pituitary gland    Migraines    Has been evaluated multiple times in past for chronic headache in which pt has had occasional nose bleeds and bloodshot eyes. This lasted for 4-5 months. CT of the head was negative in May 2012.   Myocardial infarction ([place]) 05/17/2011   Pituitary tumor    RBBB (right bundle branch block)    Noted on EKG in 2008   Ulcer     Past Surgical History:  Procedure Laterality Date    BIOPSY  10/15/2021   Procedure: BIOPSY;  Surgeon: Jerene Bears, MD;  Location: [facility];  Service: Gastroenterology;;   COLONOSCOPY     egd  03/05/2011   Dr. Benson [place]   ESOPHAGOGASTRODUODENOSCOPY (EGD) WITH PROPOFOL N/A 10/15/2021   Procedure: ESOPHAGOGASTRODUODENOSCOPY (EGD) WITH PROPOFOL;  Surgeon: Jerene Bears, MD;  Location: [facility];  Service: Gastroenterology;  Laterality: N/A;  possible dilation   FLEXIBLE SIGMOIDOSCOPY  03/05/2011   Dr. Benson [place]   HAMMER TOE SURGERY     Heart stent     HERNIA REPAIR     In 20's   [place] DILATION  10/15/2021   Procedure: MALONEY DILATION;  Surgeon: Jerene Bears, MD;  Location: [facility];  Service: Gastroenterology;;  52cm    ORIF TIBIA PLATEAU Left 08/04/2018   Procedure: OPEN REDUCTION INTERNAL FIXATION (ORIF) TIBIAL PLATEAU;  Surgeon: Altamese Cherokee Pass, MD;  Location: [facility];  Service: Orthopedics;  Laterality: Left;   SIGMOIDOSCOPY     TOOTH EXTRACTION     UPPER GASTROINTESTINAL ENDOSCOPY      Family History  Problem Relation Age of Onset   Stroke Mother    Heart attack Father 85       MI   Skin cancer Father    Cancer Father        ? type "rare"   Obesity Brother    Coronary artery disease Brother        Possible, pt not sure of specifics   Hypertension Brother    Colon polyps Maternal Aunt    Colon cancer Maternal Aunt    Colon polyps Paternal Grandmother    Other Neg Hx    Esophageal cancer Neg Hx    Rectal cancer Neg Hx    Stomach cancer Neg Hx     Social History   Socioeconomic History   Marital status: Single    Spouse name: Not on file   Number of children: Not on file   Years of education: Not on file   Highest education level: Not on file  Occupational History   Occupation: Personnel officer-- cleans 3 buildings    Comment: Fairly physical job    Employer: [employer]  Tobacco Use   Smoking status: Never   Smokeless tobacco: Never  Vaping Use   Vaping Use: Never used  Substance and Sexual Activity    Alcohol use: No   Drug use: No   Sexual activity: Not Currently  Other Topics Concern   Not on file  Social History Narrative   Lives with brother and mother   Mother is quite sick, he and his brother take turns taking care of her   No children  Social Determinants of Health   Financial Resource Strain: Low Risk    Difficulty of Paying Living Expenses: Not hard at all  Food Insecurity: No Food Insecurity   Worried About Charity fundraiser in the Last Year: Never true   Treasure in the Last Year: Never true  Transportation Needs: No Transportation Needs   Lack of Transportation (Medical): No   Lack of Transportation (Non-Medical): No  Physical Activity: Sufficiently Active   Days of Exercise per Week: 7 days   Minutes of Exercise per Session: 30 min  Stress: No Stress Concern Present   Feeling of Stress : Not at all  Social Connections: Socially Isolated   Frequency of Communication with Friends and Family: More than three times a week   Frequency of Social Gatherings with Friends and Family: More than three times a week   Attends Religious Services: Never   Marine scientist or Organizations: No   Attends Music therapist: Never   Marital Status: Never married  Human resources officer Violence: Not At Risk   Fear of Current or Ex-Partner: No   Emotionally Abused: No   Physically Abused: No   Sexually Abused: No    Review of Systems  Constitutional:  Negative for fatigue.  Respiratory:  Positive for apnea and shortness of breath.   Psychiatric/Behavioral:  Positive for sleep disturbance.    Vitals:   11/18/21 0908  BP: 126/78  Pulse: 85  Temp: (!) 97.5 F (36.4 C)  SpO2: 96%     Physical Exam Constitutional:      Appearance: He is obese.  HENT:     Right Ear: Tympanic membrane normal.     Mouth/Throat:     Mouth: Mucous membranes are moist.     Comments: Micrognathia, crowded oropharynx, Mallampati 4 Cardiovascular:     Rate and  Rhythm: Normal rate and regular rhythm.     Heart sounds: No murmur heard.   No friction rub.  Pulmonary:     Effort: No respiratory distress.     Breath sounds: No stridor. No wheezing or rhonchi.  Musculoskeletal:     Cervical back: No rigidity or tenderness.  Neurological:     Mental Status: He is alert.     Cranial Nerves: No cranial nerve deficit.  Psychiatric:        Mood and Affect: Mood normal.   Data Reviewed: Recent sleep study reviewed showing severe obstructive sleep apnea  Compliance data reviewed 100% compliance Average use of 9 hours 5 minutes Machine set at 16 Residual AHI 7.9  Assessment:  Severe obstructive sleep apnea -Compliant with CPAP use -No significant daytime symptoms  Concern with machine not turning off correctly -We will attempt to contact DME company to troubleshoot machine  AHI improved compared to previous downloads  Plan/Recommendations:  Continue CPAP use  Weight loss efforts encouraged  Follow-up in 6 months   Sherrilyn Rist MD  Pulmonary and Critical Care 11/18/2021, 9:15 AM  CC: [name], *

## 2021-11-18 NOTE — Patient Instructions (Signed)
I will see you back in 6 months  You may need to take your machine back to the medical supply company to have them look at it to see if its something that can be fixed  Continue using CPAP nightly  Call with any other significant concerns

## 2021-11-21 ENCOUNTER — Other Ambulatory Visit: Payer: Self-pay | Admitting: Family Medicine

## 2021-12-14 ENCOUNTER — Ambulatory Visit: Payer: Medicare Other | Admitting: Family Medicine

## 2021-12-25 ENCOUNTER — Encounter: Payer: Self-pay | Admitting: Family Medicine

## 2021-12-25 ENCOUNTER — Telehealth: Payer: Self-pay

## 2021-12-25 ENCOUNTER — Ambulatory Visit (INDEPENDENT_AMBULATORY_CARE_PROVIDER_SITE_OTHER): Payer: Medicare HMO | Admitting: Family Medicine

## 2021-12-25 VITALS — BP 124/80 | HR 72 | Temp 98.1°F | Resp 22 | Ht 74.0 in | Wt 290.8 lb

## 2021-12-25 DIAGNOSIS — R1024 Suprapubic pain: Secondary | ICD-10-CM

## 2021-12-25 DIAGNOSIS — E1169 Type 2 diabetes mellitus with other specified complication: Secondary | ICD-10-CM

## 2021-12-25 DIAGNOSIS — E785 Hyperlipidemia, unspecified: Secondary | ICD-10-CM

## 2021-12-25 DIAGNOSIS — I10 Essential (primary) hypertension: Secondary | ICD-10-CM

## 2021-12-25 DIAGNOSIS — R102 Pelvic and perineal pain: Secondary | ICD-10-CM

## 2021-12-25 DIAGNOSIS — E1165 Type 2 diabetes mellitus with hyperglycemia: Secondary | ICD-10-CM

## 2021-12-25 DIAGNOSIS — I2510 Atherosclerotic heart disease of native coronary artery without angina pectoris: Secondary | ICD-10-CM | POA: Diagnosis not present

## 2021-12-25 DIAGNOSIS — K219 Gastro-esophageal reflux disease without esophagitis: Secondary | ICD-10-CM | POA: Diagnosis not present

## 2021-12-25 DIAGNOSIS — D352 Benign neoplasm of pituitary gland: Secondary | ICD-10-CM

## 2021-12-25 LAB — COMPREHENSIVE METABOLIC PANEL
ALT: 92 U/L — ABNORMAL HIGH (ref 0–53)
AST: 78 U/L — ABNORMAL HIGH (ref 0–37)
Albumin: 4.6 g/dL (ref 3.5–5.2)
Alkaline Phosphatase: 55 U/L (ref 39–117)
BUN: 14 mg/dL (ref 6–23)
CO2: 35 mEq/L — ABNORMAL HIGH (ref 19–32)
Calcium: 9.7 mg/dL (ref 8.4–10.5)
Chloride: 101 mEq/L (ref 96–112)
Creatinine, Ser: 0.83 mg/dL (ref 0.40–1.50)
GFR: 94.8 mL/min (ref >60.00)
Glucose, Bld: 137 mg/dL — ABNORMAL HIGH (ref 70–99)
Potassium: 4.2 mEq/L (ref 3.5–5.1)
Sodium: 139 mEq/L (ref 135–145)
Total Bilirubin: 0.8 mg/dL (ref 0.2–1.2)
Total Protein: 7.1 g/dL (ref 6.0–8.3)

## 2021-12-25 LAB — CBC WITH DIFFERENTIAL/PLATELET
Basophils Absolute: 0 10*3/uL (ref 0.0–0.1)
Basophils Relative: 0.7 % (ref 0.0–3.0)
Eosinophils Absolute: 0.1 10*3/uL (ref 0.0–0.7)
Eosinophils Relative: 2.8 % (ref 0.0–5.0)
HCT: 43.9 % (ref 39.0–52.0)
Hemoglobin: 14.6 g/dL (ref 13.0–17.0)
Lymphocytes Relative: 30.7 % (ref 12.0–46.0)
Lymphs Abs: 1.6 10*3/uL (ref 0.7–4.0)
MCHC: 33.2 g/dL (ref 30.0–36.0)
MCV: 97.3 fl (ref 78.0–100.0)
Monocytes Absolute: 0.4 10*3/uL (ref 0.1–1.0)
Monocytes Relative: 8 % (ref 3.0–12.0)
Neutro Abs: 3 10*3/uL (ref 1.4–7.7)
Neutrophils Relative %: 57.8 % (ref 43.0–77.0)
Platelets: 185 10*3/uL (ref 150.0–400.0)
RBC: 4.51 Mil/uL (ref 4.22–5.81)
RDW: 14 % (ref 11.5–15.5)
WBC: 5.2 10*3/uL (ref 4.0–10.5)

## 2021-12-25 LAB — LIPID PANEL
Cholesterol: 134 mg/dL (ref 0–200)
HDL: 50.5 mg/dL (ref >39.00)
LDL Cholesterol: 63 mg/dL (ref 0–99)
NonHDL: 83.28
Total CHOL/HDL Ratio: 3
Triglycerides: 100 mg/dL (ref 0.0–149.0)
VLDL: 20 mg/dL (ref 0.0–40.0)

## 2021-12-25 LAB — MICROALBUMIN / CREATININE URINE RATIO
Creatinine,U: 181.7 mg/dL
Microalb Creat Ratio: 1.8 mg/g (ref 0.0–30.0)
Microalb, Ur: 3.3 mg/dL — ABNORMAL HIGH (ref 0.0–1.9)

## 2021-12-25 LAB — PROLACTIN: Prolactin: 115.3 ng/mL — ABNORMAL HIGH (ref 2.0–18.0)

## 2021-12-25 LAB — HEMOGLOBIN A1C: Hgb A1c MFr Bld: 8.5 % — ABNORMAL HIGH (ref 4.6–6.5)

## 2021-12-25 MED ORDER — ESOMEPRAZOLE MAGNESIUM 40 MG PO CPDR: 40.0000 mg | DELAYED_RELEASE_CAPSULE | Freq: Two times a day (BID) | ORAL | 3 refills | Status: DC

## 2021-12-25 MED ORDER — ATORVASTATIN CALCIUM 40 MG PO TABS: 40.0000 mg | ORAL_TABLET | Freq: Every day | ORAL | 1 refills | Status: DC

## 2021-12-25 MED ORDER — LOSARTAN POTASSIUM 50 MG PO TABS: 50.0000 mg | ORAL_TABLET | Freq: Every day | ORAL | 3 refills | Status: DC

## 2021-12-25 MED ORDER — EZETIMIBE 10 MG PO TABS: 10.0000 mg | ORAL_TABLET | Freq: Every day | ORAL | 3 refills | Status: DC

## 2021-12-25 NOTE — Patient Instructions (Addendum)
Call Dr Jerline Pain    DASH Eating Plan DASH stands for Dietary Approaches to Stop Hypertension. The DASH eating plan is a healthy eating plan that has been shown to: Reduce high blood pressure (hypertension). Reduce your risk for type 2 diabetes, heart disease, and stroke. Help with weight loss. What are tips for following this plan? Reading food labels Check food labels for the amount of salt (sodium) per serving. Choose foods with less than 5 percent of the Daily Value of sodium. Generally, foods with less than 300 milligrams (mg) of sodium per serving fit into this eating plan. To find whole grains, look for the word "whole" as the first word in the ingredient list. Shopping Buy products labeled as "low-sodium" or "no salt added." Buy fresh foods. Avoid canned foods and pre-made or frozen meals. Cooking Avoid adding salt when cooking. Use salt-free seasonings or herbs instead of table salt or sea salt. Check with your health care provider or pharmacist before using salt substitutes. Do not fry foods. Cook foods using healthy methods such as baking, boiling, grilling, roasting, and broiling instead. Cook with heart-healthy oils, such as olive, canola, avocado, soybean, or sunflower oil. Meal planning  Eat a balanced diet that includes: 4 or more servings of fruits and 4 or more servings of vegetables each day. Try to fill one-half of your plate with fruits and vegetables. 6-8 servings of whole grains each day. Less than 6 oz (170 g) of lean meat, poultry, or fish each day. A 3-oz (85-g) serving of meat is about the same size as a deck of cards. One egg equals 1 oz (28 g). 2-3 servings of low-fat dairy each day. One serving is 1 cup (237 mL). 1 serving of nuts, seeds, or beans 5 times each week. 2-3 servings of heart-healthy fats. Healthy fats called omega-3 fatty acids are found in foods such as walnuts, flaxseeds, fortified milks, and eggs. These fats are also found in  cold-water fish, such as sardines, salmon, and mackerel. Limit how much you eat of: Canned or prepackaged foods. Food that is high in trans fat, such as some fried foods. Food that is high in saturated fat, such as fatty meat. Desserts and other sweets, sugary drinks, and other foods with added sugar. Full-fat dairy products. Do not salt foods before eating. Do not eat more than 4 egg yolks a week. Try to eat at least 2 vegetarian meals a week. Eat more home-cooked food and less restaurant, buffet, and fast food. Lifestyle When eating at a restaurant, ask that your food be prepared with less salt or no salt, if possible. If you drink alcohol: Limit how much you use to: 0-1 drink a day for women who are not pregnant. 0-2 drinks a day for men. Be aware of how much alcohol is in your drink. In the [place] one drink equals one 12 oz bottle of beer (355 mL), one 5 oz glass of wine (148 mL), or one 1 oz glass of hard liquor (44 mL). General information Avoid eating more than 2,300 mg of salt a day. If you have hypertension, you may need to reduce your sodium intake to 1,500 mg a day. Work with your health care provider to maintain a healthy body weight or to lose weight. Ask what an ideal weight is for you. Get at least 30 minutes of exercise that causes your heart to beat faster (aerobic exercise) most days of the week. Activities may include walking, swimming, or biking. Work with your  health care provider or dietitian to adjust your eating plan to your individual calorie needs. What foods should I eat? Fruits All fresh, dried, or frozen fruit. Canned fruit in natural juice (without added sugar). Vegetables Fresh or frozen vegetables (raw, steamed, roasted, or grilled). Low-sodium or reduced-sodium tomato and vegetable juice. Low-sodium or reduced-sodium tomato sauce and tomato paste. Low-sodium or reduced-sodium canned vegetables. Grains Whole-grain or whole-wheat bread. Whole-grain or  whole-wheat pasta. Brown rice. [place]. Bulgur. Whole-grain and low-sodium cereals. Pita bread. Low-fat, low-sodium crackers. Whole-wheat flour tortillas. Meats and other proteins Skinless chicken or [place]. Ground chicken or [place]. Pork with fat trimmed off. Fish and seafood. Egg whites. Dried beans, peas, or lentils. Unsalted nuts, nut butters, and seeds. Unsalted canned beans. Lean cuts of beef with fat trimmed off. Low-sodium, lean precooked or cured meat, such as sausages or meat loaves. Dairy Low-fat (1%) or fat-free (skim) milk. Reduced-fat, low-fat, or fat-free cheeses. Nonfat, low-sodium ricotta or cottage cheese. Low-fat or nonfat yogurt. Low-fat, low-sodium cheese. Fats and oils Soft margarine without trans fats. Vegetable oil. Reduced-fat, low-fat, or light mayonnaise and salad dressings (reduced-sodium). Canola, safflower, olive, avocado, soybean, and sunflower oils. Avocado. Seasonings and condiments Herbs. Spices. Seasoning mixes without salt. Other foods Unsalted popcorn and pretzels. Fat-free sweets. The items listed above may not be a complete list of foods and beverages you can eat. Contact a dietitian for more information. What foods should I avoid? Fruits Canned fruit in a light or heavy syrup. Fried fruit. Fruit in cream or butter sauce. Vegetables Creamed or fried vegetables. Vegetables in a cheese sauce. Regular canned vegetables (not low-sodium or reduced-sodium). Regular canned tomato sauce and paste (not low-sodium or reduced-sodium). Regular tomato and vegetable juice (not low-sodium or reduced-sodium). Angie Fava. Olives. Grains Baked goods made with fat, such as croissants, muffins, or some breads. Dry pasta or rice meal packs. Meats and other proteins Fatty cuts of meat. Ribs. Fried meat. Berniece Salines. Bologna, salami, and other precooked or cured meats, such as sausages or meat loaves. Fat from the back of a pig (fatback). Bratwurst. Salted nuts and seeds. Canned  beans with added salt. Canned or smoked fish. Whole eggs or egg yolks. Chicken or [place] with skin. Dairy Whole or 2% milk, cream, and half-and-half. Whole or full-fat cream cheese. Whole-fat or sweetened yogurt. Full-fat cheese. Nondairy creamers. Whipped toppings. Processed cheese and cheese spreads. Fats and oils Butter. Stick margarine. Lard. Shortening. Ghee. Bacon fat. Tropical oils, such as coconut, palm kernel, or palm oil. Seasonings and condiments Onion salt, garlic salt, seasoned salt, table salt, and sea salt. Worcestershire sauce. Tartar sauce. Barbecue sauce. Teriyaki sauce. Soy sauce, including reduced-sodium. Steak sauce. Canned and packaged gravies. Fish sauce. Oyster sauce. Cocktail sauce. Store-bought horseradish. Ketchup. Mustard. Meat flavorings and tenderizers. Bouillon cubes. Hot sauces. Pre-made or packaged marinades. Pre-made or packaged taco seasonings. Relishes. Regular salad dressings. Other foods Salted popcorn and pretzels. The items listed above may not be a complete list of foods and beverages you should avoid. Contact a dietitian for more information. Where to find more information National Heart, Lung, and Blood Institute: https://wilson-eaton.com/ American Heart Association: www.heart.org Academy of Nutrition and Dietetics: www.eatright.[place]: www.kidney.org Summary The DASH eating plan is a healthy eating plan that has been shown to reduce high blood pressure (hypertension). It may also reduce your risk for type 2 diabetes, heart disease, and stroke. When on the DASH eating plan, aim to eat more fresh fruits and vegetables, whole grains, lean proteins, low-fat dairy,  and heart-healthy fats. With the DASH eating plan, you should limit salt (sodium) intake to 2,300 mg a day. If you have hypertension, you may need to reduce your sodium intake to 1,500 mg a day. Work with your health care provider or dietitian to adjust your eating plan to your  individual calorie needs. This information is not intended to replace advice given to you by your health care provider. Make sure you discuss any questions you have with your health care provider. Document Revised: 09/21/2019 Document Reviewed: 09/21/2019 Elsevier Patient Education  2022 [employer].

## 2021-12-25 NOTE — Assessment & Plan Note (Signed)
Well controlled, no changes to meds. Encouraged heart healthy diet such as the DASH diet and exercise as tolerated.  °

## 2021-12-25 NOTE — Assessment & Plan Note (Signed)
Encourage heart healthy diet such as MIND or DASH diet, increase exercise, avoid trans fats, simple carbohydrates and processed foods, consider a krill or fish or flaxseed oil cap daily.  °

## 2021-12-25 NOTE — Progress Notes (Signed)
Subjective:   By signing my name below, I, Michael Pearson, attest that this documentation has been prepared under the direction and in the presence of [name], DO. 12/25/2021   Patient ID: Michael Pearson, male    DOB: 12-Mar-1961, 61 y.o.   MRN: [id number]  Chief Complaint  Patient presents with   Hypertension   Hyperlipidemia   Follow-up    HPI Patient is in today for an office visit and 6 month f/u.  His blood pressure is stable at this visit. BP Readings from Last 3 Encounters:  12/25/21 124/80  11/18/21 126/78  10/15/21 119/73    He reports his knee pain is worsening and cannot walk through the grocery store without sitting down 4-5 times. Last time he went to orthopedics, he was given an injection but notes it has only made it worse. He reports he is trying to exercise but gets tired easily due to his knee pain.  He reports his left ankle has been swelling up and he needed to get new shoes because of foot pain. He received an injection but the relief lasted for only 2 days.   He reports pain in his lower abdominal area associated with frequency and pain during bowel movements.  He has received the flu vaccine.  Past Medical History:  Diagnosis Date   Allergy    Arthritis    Asthma    CAD (coronary artery disease)    NSTEMI 7/12:  Cardiac cath on 7/17: pLAD occluded, Dx 30-40%, pRCA 30-40%, mRCA 30-40%.  Proximal LAD was treated with a BMS.  Echo 7/19:  EF 60-65%, mild LVH.    ETT-Myoview 6/14:  Inf thinning, no ischemia, EF 64%, normal study // Myoview 04/2020: EF 57, normal perfusion; Low Risk     Cognitive communication deficit    Colitis    Diverticulosis    Duodenitis    May 2012 with heme positive stools at this time. Endorses only rare streaking of blood now.   External hemorrhoids    GERD (gastroesophageal reflux disease)    Heart murmur    as a child per the pt   Hiatal hernia    Hx of hemorrhoids    Hyperlipidemia    Hypertension    Kidney stones     Microadenoma    MRI in 2008 demonstrating 4.6 mm area of pituitary gland    Migraines    Has been evaluated multiple times in past for chronic headache in which pt has had occasional nose bleeds and bloodshot eyes. This lasted for 4-5 months. CT of the head was negative in May 2012.   Myocardial infarction ([place]) 05/17/2011   Pituitary tumor    RBBB (right bundle branch block)    Noted on EKG in 2008   Ulcer     Past Surgical History:  Procedure Laterality Date   BIOPSY  10/15/2021   Procedure: BIOPSY;  Surgeon: Jerene Bears, MD;  Location: [facility];  Service: Gastroenterology;;   COLONOSCOPY     egd  03/05/2011   Dr. Benson Norway   ESOPHAGOGASTRODUODENOSCOPY (EGD) WITH PROPOFOL N/A 10/15/2021   Procedure: ESOPHAGOGASTRODUODENOSCOPY (EGD) WITH PROPOFOL;  Surgeon: Jerene Bears, MD;  Location: [facility];  Service: Gastroenterology;  Laterality: N/A;  possible dilation   FLEXIBLE SIGMOIDOSCOPY  03/05/2011   Dr. Benson Norway   HAMMER TOE SURGERY     Heart stent     [place]     In 20's   [place]  10/15/2021  Procedure: MALONEY DILATION;  Surgeon: Jerene Bears, MD;  Location: [facility];  Service: Gastroenterology;;  52cm    ORIF TIBIA PLATEAU Left 08/04/2018   Procedure: OPEN REDUCTION INTERNAL FIXATION (ORIF) TIBIAL PLATEAU;  Surgeon: Altamese Dumas, MD;  Location: [place];  Service: Orthopedics;  Laterality: Left;   SIGMOIDOSCOPY     TOOTH EXTRACTION     UPPER GASTROINTESTINAL ENDOSCOPY      Family History  Problem Relation Age of Onset   Stroke Mother    Heart attack Father 1       MI   Skin cancer Father    Cancer Father        ? type "rare"   Obesity Brother    Coronary artery disease Brother        Possible, pt not sure of specifics   Hypertension Brother    Colon polyps Maternal Aunt    Colon cancer Maternal Aunt    Colon polyps Paternal Grandmother    Other Neg Hx    Esophageal cancer Neg Hx    Rectal cancer Neg Hx    Stomach cancer Neg Hx     Social  History   Socioeconomic History   Marital status: Single    Spouse name: Not on file   Number of children: Not on file   Years of education: Not on file   Highest education level: Not on file  Occupational History   Occupation: Personnel officer-- cleans 3 buildings    Comment: Fairly physical job    Employer: [employer]  Tobacco Use   Smoking status: Never   Smokeless tobacco: Never  Vaping Use   Vaping Use: Never used  Substance and Sexual Activity   Alcohol use: No   Drug use: No   Sexual activity: Not Currently  Other Topics Concern   Not on file  Social History Narrative   Lives with brother and mother   Mother is quite sick, he and his brother take turns taking care of her   No children   Social Determinants of Health   Financial Resource Strain: Low Risk    Difficulty of Paying Living Expenses: Not hard at all  Food Insecurity: No Food Insecurity   Worried About Charity fundraiser in the Last Year: Never true   Ran Out of Food in the Last Year: Never true  Transportation Needs: No Transportation Needs   Lack of Transportation (Medical): No   Lack of Transportation (Non-Medical): No  Physical Activity: Sufficiently Active   Days of Exercise per Week: 7 days   Minutes of Exercise per Session: 30 min  Stress: No Stress Concern Present   Feeling of Stress : Not at all  Social Connections: Socially Isolated   Frequency of Communication with Friends and Family: More than three times a week   Frequency of Social Gatherings with Friends and Family: More than three times a week   Attends Religious Services: Never   Marine scientist or Organizations: No   Attends Music therapist: Never   Marital Status: Never married  Human resources officer Violence: Not At Risk   Fear of Current or Ex-Partner: No   Emotionally Abused: No   Physically Abused: No   Sexually Abused: No    Outpatient Medications Prior to Visit  Medication Sig Dispense Refill    albuterol (PROVENTIL HFA;VENTOLIN HFA) 108 (90 Base) MCG/ACT inhaler Inhale 2 puffs into the lungs every 4 (four) hours as needed for wheezing or shortness  of breath. 1 Inhaler 2   aspirin EC 81 MG tablet Take 81 mg by mouth in the morning. Swallow whole.     cabergoline (DOSTINEX) 0.5 MG tablet Take 0.5 tablets (0.25 mg total) by mouth 2 (two) times a week. 12 tablet 3   cetirizine (ZYRTEC) 10 MG tablet Take 10 mg by mouth daily as needed for allergies.      docusate sodium (COLACE) 100 MG capsule Take 100 mg by mouth 2 (two) times daily as needed (constipation.).     fluticasone furoate-vilanterol (BREO ELLIPTA) 200-25 MCG/INH AEPB Inhale 1 puff into the lungs daily. (Patient taking differently: Inhale 1 puff into the lungs daily as needed (respiratory issues.).) 60 each 2   metFORMIN (GLUCOPHAGE-XR) 500 MG 24 hr tablet Take 2 tablets (1,000 mg total) by mouth daily. (Patient taking differently: Take 500 mg by mouth daily.) 180 tablet 3   Multiple Vitamin (MULTIVITAMIN WITH MINERALS) TABS tablet Take 1 tablet by mouth in the morning.     triamcinolone cream (KENALOG) 0.1 % Apply 1 application topically 2 (two) times daily. (Patient taking differently: Apply 1 application topically 2 (two) times daily as needed (skin irritation/skin folds).) 30 g 2   atorvastatin (LIPITOR) 40 MG tablet Take 1 tablet (40 mg total) by mouth daily. 90 tablet 1   esomeprazole (NEXIUM) 40 MG capsule TAKE ONE CAPSULE BY MOUTH TWICE DAILY BEFORE MEALS 180 capsule 0   ezetimibe (ZETIA) 10 MG tablet TAKE ONE TABLET BY MOUTH ONE TIME DAILY 90 tablet 2   losartan (COZAAR) 50 MG tablet TAKE ONE TABLET BY MOUTH ONE TIME DAILY 90 tablet 2   No facility-administered medications prior to visit.    No Known Allergies  Review of Systems  Constitutional:  Negative for fever.  HENT:  Negative for congestion, ear pain, hearing loss, sinus pain and sore throat.   Eyes:  Negative for blurred vision and pain.  Respiratory:  Negative  for cough, sputum production, shortness of breath and wheezing.   Cardiovascular:  Positive for leg swelling (left ankle). Negative for chest pain and palpitations.  Gastrointestinal:  Positive for abdominal pain. Negative for blood in stool, constipation, diarrhea, nausea and vomiting.       (+) pain during bowel movements  Genitourinary:  Positive for frequency. Negative for dysuria, hematuria and urgency.  Musculoskeletal:  Positive for joint pain (knees bilaterally). Negative for back pain, falls and myalgias.  Neurological:  Negative for dizziness, sensory change, loss of consciousness, weakness and headaches.  Endo/Heme/Allergies:  Negative for environmental allergies. Does not bruise/bleed easily.  Psychiatric/Behavioral:  Negative for depression and suicidal ideas. The patient is not nervous/anxious and does not have insomnia.       Objective:    Physical Exam Constitutional:      General: He is not in acute distress.    Appearance: Normal appearance. He is not ill-appearing.  HENT:     Head: Normocephalic and atraumatic.     Right Ear: Tympanic membrane, ear canal and external ear normal.     Left Ear: Tympanic membrane, ear canal and external ear normal.  Eyes:     Pupils: Pupils are equal, round, and reactive to light.  Cardiovascular:     Rate and Rhythm: Normal rate and regular rhythm.     Pulses: Normal pulses.     Heart sounds: No murmur heard.   No gallop.  Pulmonary:     Effort: Pulmonary effort is normal. No respiratory distress.     Breath sounds:  Normal breath sounds. No wheezing or rhonchi.  Abdominal:     General: Bowel sounds are normal. There is no distension.     Palpations: Abdomen is soft.     Tenderness: There is abdominal tenderness in the suprapubic area. There is no guarding.     Hernia: No hernia is present.     Comments: Minimal tenderness on palpation   Musculoskeletal:     Cervical back: Neck supple.  Lymphadenopathy:     Cervical: No  cervical adenopathy.  Skin:    General: Skin is warm and dry.  Neurological:     Mental Status: He is alert and oriented to person, place, and time.    BP 124/80 (BP Location: Left Arm, Patient Position: Sitting, Cuff Size: Large)    Pulse 72    Temp 98.1 F (36.7 C) (Oral)    Resp (!) 22    Ht 6\' 2"  (1.88 m)    Wt 290 lb 12.8 oz (131.9 kg)    SpO2 93%    BMI 37.34 kg/m  Wt Readings from Last 3 Encounters:  12/25/21 290 lb 12.8 oz (131.9 kg)  11/18/21 295 lb (133.8 kg)  10/15/21 285 lb (129.3 kg)    Diabetic Foot Exam - Simple   No data filed    Lab Results  Component Value Date   WBC 6.3 02/25/2021   HGB 15.1 02/25/2021   HCT 45.0 02/25/2021   PLT 188.0 02/25/2021   GLUCOSE 114 (H) 09/17/2021   CHOL 128 09/17/2021   TRIG 52.0 09/17/2021   HDL 60.90 09/17/2021   LDLCALC 57 09/17/2021   ALT 66 (H) 09/17/2021   AST 39 (H) 09/17/2021   NA 139 09/17/2021   K 4.4 09/17/2021   CL 102 09/17/2021   CREATININE 0.94 09/17/2021   BUN 14 09/17/2021   CO2 32 09/17/2021   TSH 1.87 07/29/2021   PSA 0.58 12/09/2020   INR 0.97 05/17/2011   HGBA1C 8.2 (H) 07/29/2021   MICROALBUR 2.0 (H) 12/09/2020    Lab Results  Component Value Date   TSH 1.87 07/29/2021   Lab Results  Component Value Date   WBC 6.3 02/25/2021   HGB 15.1 02/25/2021   HCT 45.0 02/25/2021   MCV 95.8 02/25/2021   PLT 188.0 02/25/2021   Lab Results  Component Value Date   NA 139 09/17/2021   K 4.4 09/17/2021   CO2 32 09/17/2021   GLUCOSE 114 (H) 09/17/2021   BUN 14 09/17/2021   CREATININE 0.94 09/17/2021   BILITOT 0.7 09/17/2021   ALKPHOS 56 09/17/2021   AST 39 (H) 09/17/2021   ALT 66 (H) 09/17/2021   PROT 6.8 09/17/2021   ALBUMIN 4.4 09/17/2021   CALCIUM 9.4 09/17/2021   ANIONGAP 9 08/05/2018   GFR 87.98 09/17/2021   Lab Results  Component Value Date   CHOL 128 09/17/2021   Lab Results  Component Value Date   HDL 60.90 09/17/2021   Lab Results  Component Value Date   LDLCALC 57  09/17/2021   Lab Results  Component Value Date   TRIG 52.0 09/17/2021   Lab Results  Component Value Date   CHOLHDL 2 09/17/2021   Lab Results  Component Value Date   HGBA1C 8.2 (H) 07/29/2021       Assessment & Plan:   Problem List Items Addressed This Visit       Unprioritized   CAD (coronary artery disease)   Relevant Medications   losartan (COZAAR) 50 MG tablet  ezetimibe (ZETIA) 10 MG tablet   atorvastatin (LIPITOR) 40 MG tablet   GERD (gastroesophageal reflux disease)   Relevant Medications   esomeprazole (NEXIUM) 40 MG capsule   Hypertension   Relevant Medications   losartan (COZAAR) 50 MG tablet   ezetimibe (ZETIA) 10 MG tablet   atorvastatin (LIPITOR) 40 MG tablet   Other Relevant Orders   CBC with Differential/Platelet   Comprehensive metabolic panel   Microalbumin / creatinine urine ratio   Lipid panel   Pituitary adenoma ([place]) - Primary   Relevant Orders   Prolactin   Essential hypertension    Well controlled, no changes to meds. Encouraged heart healthy diet such as the DASH diet and exercise as tolerated.       Relevant Medications   losartan (COZAAR) 50 MG tablet   ezetimibe (ZETIA) 10 MG tablet   atorvastatin (LIPITOR) 40 MG tablet   Hyperlipidemia    Encourage heart healthy diet such as MIND or DASH diet, increase exercise, avoid trans fats, simple carbohydrates and processed foods, consider a krill or fish or flaxseed oil cap daily.       Relevant Medications   losartan (COZAAR) 50 MG tablet   ezetimibe (ZETIA) 10 MG tablet   atorvastatin (LIPITOR) 40 MG tablet   Other Visit Diagnoses     Type 2 diabetes mellitus with hyperglycemia, without long-term current use of insulin (HCC)       Relevant Medications   losartan (COZAAR) 50 MG tablet   atorvastatin (LIPITOR) 40 MG tablet   Other Relevant Orders   Comprehensive metabolic panel   Hemoglobin A1c   Microalbumin / creatinine urine ratio   Lipid panel   Hyperlipidemia associated  with type 2 diabetes mellitus (HCC)       Relevant Medications   losartan (COZAAR) 50 MG tablet   ezetimibe (ZETIA) 10 MG tablet   atorvastatin (LIPITOR) 40 MG tablet   Other Relevant Orders   Comprehensive metabolic panel   Lipid panel   Suprapubic abdominal pain       Relevant Orders   POCT Urinalysis Dipstick (Automated)       Meds ordered this encounter  Medications   losartan (COZAAR) 50 MG tablet    Sig: Take 1 tablet (50 mg total) by mouth daily.    Dispense:  90 tablet    Refill:  3   ezetimibe (ZETIA) 10 MG tablet    Sig: Take 1 tablet (10 mg total) by mouth daily.    Dispense:  90 tablet    Refill:  3   esomeprazole (NEXIUM) 40 MG capsule    Sig: Take 1 capsule (40 mg total) by mouth 2 (two) times daily before a meal.    Dispense:  180 capsule    Refill:  3   atorvastatin (LIPITOR) 40 MG tablet    Sig: Take 1 tablet (40 mg total) by mouth daily.    Dispense:  90 tablet    Refill:  1    I,Michael Pearson,acting as a Education administrator for [employer], DO.,have documented all relevant documentation on the behalf of [name], DO,as directed by  [name], DO while in the presence of [name], DO.   I, [name], DO. , personally preformed the services described in this documentation.  All medical record entries made by the scribe were at my direction and in my presence.  I have reviewed the chart and discharge instructions (if applicable) and  agree that the record reflects my personal performance and is accurate and complete. 12/25/2021

## 2021-12-25 NOTE — Assessment & Plan Note (Signed)
Check urine.

## 2021-12-25 NOTE — Telephone Encounter (Signed)
PA approved. Effective 11/01/2021 to 10/31/2022.

## 2021-12-25 NOTE — Assessment & Plan Note (Signed)
Per endo Pt needs endo f/u

## 2021-12-25 NOTE — Telephone Encounter (Signed)
PA initiated via Covermymeds; KEY: [id number]. Awaiting determination.

## 2021-12-29 DIAGNOSIS — G4733 Obstructive sleep apnea (adult) (pediatric): Secondary | ICD-10-CM | POA: Diagnosis not present

## 2022-01-01 DIAGNOSIS — G4733 Obstructive sleep apnea (adult) (pediatric): Secondary | ICD-10-CM | POA: Diagnosis not present

## 2022-01-26 DIAGNOSIS — G4733 Obstructive sleep apnea (adult) (pediatric): Secondary | ICD-10-CM | POA: Diagnosis not present

## 2022-01-27 ENCOUNTER — Other Ambulatory Visit: Payer: Self-pay

## 2022-01-27 ENCOUNTER — Encounter: Payer: Self-pay | Admitting: Endocrinology

## 2022-01-27 ENCOUNTER — Ambulatory Visit: Payer: Medicare HMO | Admitting: Endocrinology

## 2022-01-27 VITALS — BP 140/84 | HR 63 | Ht 74.0 in | Wt 293.4 lb

## 2022-01-27 DIAGNOSIS — D352 Benign neoplasm of pituitary gland: Secondary | ICD-10-CM | POA: Diagnosis not present

## 2022-01-27 MED ORDER — CABERGOLINE 0.5 MG PO TABS: 0.5000 mg | ORAL_TABLET | ORAL | 3 refills | Status: DC

## 2022-01-27 MED ORDER — RYBELSUS 3 MG PO TABS: 3.0000 mg | ORAL_TABLET | Freq: Every day | ORAL | 11 refills | Status: DC

## 2022-01-27 NOTE — Progress Notes (Signed)
? ?Subjective:  ? ? Patient ID: Michael Pearson, male    DOB: 11/16/1960, 61 y.o.   MRN: [id number] ? ?HPI ?Pt returns for f/u of pituitary microprolactinoma (in 2005, pt was incidentally noted to have a pituitary adenoma; in 2017, elev. prolactin was noted; f/u MRI in 2020 showed adenoma was down to 7 mm 2021 showed no change; h/o near-syncope limits cabergoltine dosage; other pituitary functions are normal).  No change in chronic lightheadedness or intermitt headache.   ?Type 2 DM was dx'ed 2022.  He takes metformin as rx'ed.   ?Past Medical History:  ?Diagnosis Date  ? Allergy   ? Arthritis   ? Asthma   ? CAD (coronary artery disease)   ? NSTEMI 7/12:  Cardiac cath on 7/17: pLAD occluded, Dx 30-40%, pRCA 30-40%, mRCA 30-40%.  Proximal LAD was treated with a BMS.  Echo 7/19:  EF 60-65%, mild LVH.    ETT-Myoview 6/14:  Inf thinning, no ischemia, EF 64%, normal study // Myoview 04/2020: EF 57, normal perfusion; Low Risk    ? Cognitive communication deficit   ? Colitis   ? Diverticulosis   ? Duodenitis   ? May 2012 with heme positive stools at this time. Endorses only rare streaking of blood now.  ? External hemorrhoids   ? GERD (gastroesophageal reflux disease)   ? Heart murmur   ? as a child per the pt  ? Hiatal hernia   ? Hx of hemorrhoids   ? Hyperlipidemia   ? Hypertension   ? Kidney stones   ? Microadenoma   ? MRI in 2008 demonstrating 4.6 mm area of pituitary gland   ? Migraines   ? Has been evaluated multiple times in past for chronic headache in which pt has had occasional nose bleeds and bloodshot eyes. This lasted for 4-5 months. CT of the head was negative in May 2012.  ? Myocardial infarction ([place]) 05/17/2011  ? Pituitary tumor   ? RBBB (right bundle branch block)   ? Noted on EKG in 2008  ? Ulcer   ? ? ?Past Surgical History:  ?Procedure Laterality Date  ? BIOPSY  10/15/2021  ? Procedure: BIOPSY;  Surgeon: Jerene Bears, MD;  Location: [facility];  Service: Gastroenterology;;  ? COLONOSCOPY    ? egd   03/05/2011  ? Dr. Benson Norway  ? ESOPHAGOGASTRODUODENOSCOPY (EGD) WITH PROPOFOL N/A 10/15/2021  ? Procedure: ESOPHAGOGASTRODUODENOSCOPY (EGD) WITH PROPOFOL;  Surgeon: Jerene Bears, MD;  Location: [facility];  Service: Gastroenterology;  Laterality: N/A;  possible dilation  ? FLEXIBLE SIGMOIDOSCOPY  03/05/2011  ? Dr. Benson Norway  ? HAMMER TOE SURGERY    ? Heart stent    ? HERNIA REPAIR    ? In 20's  ? MALONEY DILATION  10/15/2021  ? Procedure: MALONEY DILATION;  Surgeon: Jerene Bears, MD;  Location: [facility];  Service: Gastroenterology;;  52cm   ? ORIF TIBIA PLATEAU Left 08/04/2018  ? Procedure: OPEN REDUCTION INTERNAL FIXATION (ORIF) TIBIAL PLATEAU;  Surgeon: Altamese [place], MD;  Location: [facility];  Service: Orthopedics;  Laterality: Left;  ? SIGMOIDOSCOPY    ? TOOTH EXTRACTION    ? UPPER GASTROINTESTINAL ENDOSCOPY    ? ? ?Social History  ? ?Socioeconomic History  ? Marital status: Single  ?  Spouse name: Not on file  ? Number of children: Not on file  ? Years of education: Not on file  ? Highest education level: Not on file  ?Occupational History  ? Occupation: Psychologist, prison and probation services  3 buildings  ?  Comment: Fairly physical job  ?  Employer: [employer]  ?Tobacco Use  ? Smoking status: Never  ? Smokeless tobacco: Never  ?Vaping Use  ? Vaping Use: Never used  ?Substance and Sexual Activity  ? Alcohol use: No  ? Drug use: No  ? Sexual activity: Not Currently  ?Other Topics Concern  ? Not on file  ?Social History Narrative  ? Lives with brother and mother  ? Mother is quite sick, he and his brother take turns taking care of her  ? No children  ? ?Social Determinants of Health  ? ?Financial Resource Strain: Low Risk   ? Difficulty of Paying Living Expenses: Not hard at all  ?Food Insecurity: No Food Insecurity  ? Worried About Charity fundraiser in the Last Year: Never true  ? Ran Out of Food in the Last Year: Never true  ?Transportation Needs: No Transportation Needs  ? Lack of Transportation (Medical): No  ? Lack of  Transportation (Non-Medical): No  ?Physical Activity: Sufficiently Active  ? Days of Exercise per Week: 7 days  ? Minutes of Exercise per Session: 30 min  ?Stress: No Stress Concern Present  ? Feeling of Stress : Not at all  ?Social Connections: Socially Isolated  ? Frequency of Communication with Friends and Family: More than three times a week  ? Frequency of Social Gatherings with Friends and Family: More than three times a week  ? Attends Religious Services: Never  ? Active Member of Clubs or Organizations: No  ? Attends Archivist Meetings: Never  ? Marital Status: Never married  ?Intimate Partner Violence: Not At Risk  ? Fear of Current or Ex-Partner: No  ? Emotionally Abused: No  ? Physically Abused: No  ? Sexually Abused: No  ? ? ?Current Outpatient Medications on File Prior to Visit  ?Medication Sig Dispense Refill  ? albuterol (PROVENTIL HFA;VENTOLIN HFA) 108 (90 Base) MCG/ACT inhaler Inhale 2 puffs into the lungs every 4 (four) hours as needed for wheezing or shortness of breath. 1 Inhaler 2  ? aspirin EC 81 MG tablet Take 81 mg by mouth in the morning. Swallow whole.    ? atorvastatin (LIPITOR) 40 MG tablet Take 1 tablet (40 mg total) by mouth daily. 90 tablet 1  ? cetirizine (ZYRTEC) 10 MG tablet Take 10 mg by mouth daily as needed for allergies.     ? docusate sodium (COLACE) 100 MG capsule Take 100 mg by mouth 2 (two) times daily as needed (constipation.).    ? esomeprazole (NEXIUM) 40 MG capsule Take 1 capsule (40 mg total) by mouth 2 (two) times daily before a meal. 180 capsule 3  ? ezetimibe (ZETIA) 10 MG tablet Take 1 tablet (10 mg total) by mouth daily. 90 tablet 3  ? fluticasone furoate-vilanterol (BREO ELLIPTA) 200-25 MCG/INH AEPB Inhale 1 puff into the lungs daily. (Patient taking differently: Inhale 1 puff into the lungs daily as needed (respiratory issues.).) 60 each 2  ? losartan (COZAAR) 50 MG tablet Take 1 tablet (50 mg total) by mouth daily. 90 tablet 3  ? metFORMIN  (GLUCOPHAGE-XR) 500 MG 24 hr tablet Take 2 tablets (1,000 mg total) by mouth daily. (Patient taking differently: Take 500 mg by mouth daily.) 180 tablet 3  ? Multiple Vitamin (MULTIVITAMIN WITH MINERALS) TABS tablet Take 1 tablet by mouth in the morning.    ? triamcinolone cream (KENALOG) 0.1 % Apply 1 application topically 2 (two) times daily. (Patient taking  differently: Apply 1 application. topically 2 (two) times daily as needed (skin irritation/skin folds).) 30 g 2  ? ?No current facility-administered medications on file prior to visit.  ? ? ?No Known Allergies ? ? ? ?BP 140/84 (BP Location: Left Arm, Patient Position: Sitting, Cuff Size: Normal)   Pulse 63   Ht '6\' 2"'$  (1.88 m)   Wt 293 lb 6.4 oz (133.1 kg)   SpO2 97%   BMI 37.67 kg/m?  ? ? ?Review of Systems ?Denies visual loss.   ?   ?Objective:  ? Physical Exam ?VITAL SIGNS:  See vs page.   ?GENERAL: no distress.  ? ? ? ?Lab Results  ?Component Value Date  ? CREATININE 0.83 12/25/2021  ? BUN 14 12/25/2021  ? NA 139 12/25/2021  ? K 4.2 12/25/2021  ? CL 101 12/25/2021  ? CO2 35 (H) 12/25/2021  ? ?Lab Results  ?Component Value Date  ? HGBA1C 8.5 (H) 12/25/2021  ? ?Prolactin=115 ?   ?Assessment & Plan:  ?Hyperprolactinemia: uncontrolled.  ?Lightheadedness: we discussed.  we'll try to increase cabergoline anyway.   ?Type 2 DM: uncontrolled.   ? ?Patient Instructions  ?I have sent 2 prescriptions to your pharmacy: to increase the cabergoline, and to add Rybelsus.   ?Please call if you have nausea.    ?Please come back for a follow-up appointment in 3 months.   ? ? ?

## 2022-01-27 NOTE — Patient Instructions (Addendum)
I have sent 2 prescriptions to your pharmacy: to increase the cabergoline, and to add Rybelsus.   ?Please call if you have nausea.    ?Please come back for a follow-up appointment in 3 months.   ?

## 2022-02-01 DIAGNOSIS — G4733 Obstructive sleep apnea (adult) (pediatric): Secondary | ICD-10-CM | POA: Diagnosis not present

## 2022-02-26 DIAGNOSIS — G4733 Obstructive sleep apnea (adult) (pediatric): Secondary | ICD-10-CM | POA: Diagnosis not present

## 2022-03-03 DIAGNOSIS — G4733 Obstructive sleep apnea (adult) (pediatric): Secondary | ICD-10-CM | POA: Diagnosis not present

## 2022-03-08 DIAGNOSIS — E785 Hyperlipidemia, unspecified: Secondary | ICD-10-CM | POA: Diagnosis not present

## 2022-03-08 DIAGNOSIS — G4733 Obstructive sleep apnea (adult) (pediatric): Secondary | ICD-10-CM | POA: Diagnosis not present

## 2022-03-08 DIAGNOSIS — K219 Gastro-esophageal reflux disease without esophagitis: Secondary | ICD-10-CM | POA: Diagnosis not present

## 2022-03-08 DIAGNOSIS — M1990 Unspecified osteoarthritis, unspecified site: Secondary | ICD-10-CM | POA: Diagnosis not present

## 2022-03-08 DIAGNOSIS — J309 Allergic rhinitis, unspecified: Secondary | ICD-10-CM | POA: Diagnosis not present

## 2022-03-08 DIAGNOSIS — Z6838 Body mass index (BMI) 38.0-38.9, adult: Secondary | ICD-10-CM | POA: Diagnosis not present

## 2022-03-08 DIAGNOSIS — Z7982 Long term (current) use of aspirin: Secondary | ICD-10-CM | POA: Diagnosis not present

## 2022-03-08 DIAGNOSIS — Z008 Encounter for other general examination: Secondary | ICD-10-CM | POA: Diagnosis not present

## 2022-03-08 DIAGNOSIS — E1142 Type 2 diabetes mellitus with diabetic polyneuropathy: Secondary | ICD-10-CM | POA: Diagnosis not present

## 2022-03-08 DIAGNOSIS — I252 Old myocardial infarction: Secondary | ICD-10-CM | POA: Diagnosis not present

## 2022-03-08 DIAGNOSIS — K5900 Constipation, unspecified: Secondary | ICD-10-CM | POA: Diagnosis not present

## 2022-03-08 DIAGNOSIS — I10 Essential (primary) hypertension: Secondary | ICD-10-CM | POA: Diagnosis not present

## 2022-04-03 DIAGNOSIS — G4733 Obstructive sleep apnea (adult) (pediatric): Secondary | ICD-10-CM | POA: Diagnosis not present

## 2022-04-27 DIAGNOSIS — G4733 Obstructive sleep apnea (adult) (pediatric): Secondary | ICD-10-CM | POA: Diagnosis not present

## 2022-05-03 DIAGNOSIS — G4733 Obstructive sleep apnea (adult) (pediatric): Secondary | ICD-10-CM | POA: Diagnosis not present

## 2022-05-27 DIAGNOSIS — G4733 Obstructive sleep apnea (adult) (pediatric): Secondary | ICD-10-CM | POA: Diagnosis not present

## 2022-06-03 DIAGNOSIS — G4733 Obstructive sleep apnea (adult) (pediatric): Secondary | ICD-10-CM | POA: Diagnosis not present

## 2022-06-25 ENCOUNTER — Encounter: Payer: Self-pay | Admitting: Family Medicine

## 2022-06-25 ENCOUNTER — Ambulatory Visit (INDEPENDENT_AMBULATORY_CARE_PROVIDER_SITE_OTHER): Payer: Medicare HMO | Admitting: Family Medicine

## 2022-06-25 VITALS — BP 118/72 | HR 74 | Temp 98.1°F | Resp 20 | Ht 74.0 in | Wt 279.2 lb

## 2022-06-25 DIAGNOSIS — E785 Hyperlipidemia, unspecified: Secondary | ICD-10-CM

## 2022-06-25 DIAGNOSIS — M170 Bilateral primary osteoarthritis of knee: Secondary | ICD-10-CM | POA: Diagnosis not present

## 2022-06-25 DIAGNOSIS — K219 Gastro-esophageal reflux disease without esophagitis: Secondary | ICD-10-CM

## 2022-06-25 DIAGNOSIS — D352 Benign neoplasm of pituitary gland: Secondary | ICD-10-CM

## 2022-06-25 DIAGNOSIS — E1169 Type 2 diabetes mellitus with other specified complication: Secondary | ICD-10-CM | POA: Diagnosis not present

## 2022-06-25 DIAGNOSIS — E1165 Type 2 diabetes mellitus with hyperglycemia: Secondary | ICD-10-CM | POA: Diagnosis not present

## 2022-06-25 DIAGNOSIS — J4520 Mild intermittent asthma, uncomplicated: Secondary | ICD-10-CM

## 2022-06-25 DIAGNOSIS — E221 Hyperprolactinemia: Secondary | ICD-10-CM

## 2022-06-25 DIAGNOSIS — Z0000 Encounter for general adult medical examination without abnormal findings: Secondary | ICD-10-CM

## 2022-06-25 DIAGNOSIS — D229 Melanocytic nevi, unspecified: Secondary | ICD-10-CM

## 2022-06-25 DIAGNOSIS — G4733 Obstructive sleep apnea (adult) (pediatric): Secondary | ICD-10-CM | POA: Diagnosis not present

## 2022-06-25 DIAGNOSIS — E782 Mixed hyperlipidemia: Secondary | ICD-10-CM

## 2022-06-25 DIAGNOSIS — I10 Essential (primary) hypertension: Secondary | ICD-10-CM

## 2022-06-25 DIAGNOSIS — I2510 Atherosclerotic heart disease of native coronary artery without angina pectoris: Secondary | ICD-10-CM | POA: Diagnosis not present

## 2022-06-25 DIAGNOSIS — R41841 Cognitive communication deficit: Secondary | ICD-10-CM | POA: Diagnosis not present

## 2022-06-25 LAB — CBC WITH DIFFERENTIAL/PLATELET
Basophils Absolute: 0 10*3/uL (ref 0.0–0.1)
Basophils Relative: 0.6 % (ref 0.0–3.0)
Eosinophils Absolute: 0.1 10*3/uL (ref 0.0–0.7)
Eosinophils Relative: 2.4 % (ref 0.0–5.0)
HCT: 44.1 % (ref 39.0–52.0)
Hemoglobin: 14.5 g/dL (ref 13.0–17.0)
Lymphocytes Relative: 25.9 % (ref 12.0–46.0)
Lymphs Abs: 1.6 10*3/uL (ref 0.7–4.0)
MCHC: 32.9 g/dL (ref 30.0–36.0)
MCV: 97.7 fl (ref 78.0–100.0)
Monocytes Absolute: 0.4 10*3/uL (ref 0.1–1.0)
Monocytes Relative: 6.4 % (ref 3.0–12.0)
Neutro Abs: 4 10*3/uL (ref 1.4–7.7)
Neutrophils Relative %: 64.7 % (ref 43.0–77.0)
Platelets: 160 10*3/uL (ref 150.0–400.0)
RBC: 4.51 Mil/uL (ref 4.22–5.81)
RDW: 14.1 % (ref 11.5–15.5)
WBC: 6.1 10*3/uL (ref 4.0–10.5)

## 2022-06-25 LAB — COMPREHENSIVE METABOLIC PANEL
ALT: 57 U/L — ABNORMAL HIGH (ref 0–53)
AST: 54 U/L — ABNORMAL HIGH (ref 0–37)
Albumin: 4.6 g/dL (ref 3.5–5.2)
Alkaline Phosphatase: 49 U/L (ref 39–117)
BUN: 13 mg/dL (ref 6–23)
CO2: 29 mEq/L (ref 19–32)
Calcium: 10 mg/dL (ref 8.4–10.5)
Chloride: 100 mEq/L (ref 96–112)
Creatinine, Ser: 0.83 mg/dL (ref 0.40–1.50)
GFR: 94.47 mL/min (ref >60.00)
Glucose, Bld: 130 mg/dL — ABNORMAL HIGH (ref 70–99)
Potassium: 4.3 mEq/L (ref 3.5–5.1)
Sodium: 139 mEq/L (ref 135–145)
Total Bilirubin: 0.7 mg/dL (ref 0.2–1.2)
Total Protein: 7.1 g/dL (ref 6.0–8.3)

## 2022-06-25 LAB — HEMOGLOBIN A1C: Hgb A1c MFr Bld: 8.7 % — ABNORMAL HIGH (ref 4.6–6.5)

## 2022-06-25 LAB — LIPID PANEL
Cholesterol: 99 mg/dL (ref 0–200)
HDL: 47.4 mg/dL (ref >39.00)
LDL Cholesterol: 37 mg/dL (ref 0–99)
NonHDL: 52.05
Total CHOL/HDL Ratio: 2
Triglycerides: 76 mg/dL (ref 0.0–149.0)
VLDL: 15.2 mg/dL (ref 0.0–40.0)

## 2022-06-25 LAB — PSA: PSA: 0.73 ng/mL (ref 0.10–4.00)

## 2022-06-25 LAB — TSH: TSH: 1.64 u[IU]/mL (ref 0.35–5.50)

## 2022-06-25 MED ORDER — ESOMEPRAZOLE MAGNESIUM 40 MG PO CPDR: 40.0000 mg | DELAYED_RELEASE_CAPSULE | Freq: Two times a day (BID) | ORAL | 3 refills | Status: DC

## 2022-06-25 MED ORDER — DICLOFENAC SODIUM 1 % EX GEL: 4.0000 g | Freq: Four times a day (QID) | CUTANEOUS | 2 refills | Status: AC

## 2022-06-25 NOTE — Assessment & Plan Note (Signed)
Well controlled, no changes to meds. Encouraged heart healthy diet such as the DASH diet and exercise as tolerated.  °

## 2022-06-25 NOTE — Assessment & Plan Note (Signed)
ghm utd Check labs  See avs  

## 2022-06-25 NOTE — Patient Instructions (Signed)

## 2022-06-25 NOTE — Assessment & Plan Note (Signed)
Tolerating statin, encouraged heart healthy diet, avoid trans fats, minimize simple carbs and saturated fats. Increase exercise as tolerated 

## 2022-06-25 NOTE — Assessment & Plan Note (Signed)
F/u endo  

## 2022-06-25 NOTE — Assessment & Plan Note (Signed)
Check labs F/u cardiology 

## 2022-06-25 NOTE — Assessment & Plan Note (Signed)
stable °

## 2022-06-25 NOTE — Assessment & Plan Note (Signed)
nexium daily

## 2022-06-25 NOTE — Assessment & Plan Note (Signed)
Fu ortho

## 2022-06-25 NOTE — Progress Notes (Addendum)
Established Patient Office Visit  Subjective   Patient ID: Michael Pearson, male    DOB: 1960/12/08  Age: 61 y.o. MRN: [id number]  Chief Complaint  Patient presents with  . Annual Exam    Pt states fasting     HPI Pt is here for cpe and labs.    Pt c/o leg pain and knee pain.  It keeps him awake at night.  He has see olin in the past.   Patient Active Problem List   Diagnosis Date Noted  . Primary osteoarthritis of both knees 06/25/2022  . Suprapubic abdominal pain 12/25/2021  . Dysphagia   . Gastritis without bleeding   . Hyperglycemia 07/29/2021  . OSA (obstructive sleep apnea) 05/20/2021  . Chronic pain of both knees 12/09/2020  . Dizziness 07/31/2020  . Cognitive communication deficit   . Hypertension   . CAD (coronary artery disease)   . Tibial fracture 08/04/2018  . Closed bicondylar fracture of left tibial plateau 08/04/2018  . Fracture 08/03/2018  . Preventative health care 11/24/2017  . Hyperprolactinemia ([place]) 07/28/2017  . Pituitary adenoma ([place]) 07/28/2017  . Chest pain 05/19/2016  . Laceration of right index finger w/o foreign body w/o damage to nail 01/14/2016  . Visit for suture removal 01/09/2016  . GERD (gastroesophageal reflux disease) 12/04/2015  . Headache 08/14/2015  . History of diverticulitis of colon 03/30/2013  . Acute diverticulitis 02/03/2013  . Essential hypertension 02/03/2013  . Chronic allergic rhinitis 11/03/2012  . Heme positive stool 11/03/2012  . Foot injury 05/15/2012  . Upper airway cough syndrome p batter acid exp vs asthma  07/22/2011  . Migraines 06/17/2011  . Mild intermittent asthma 06/17/2011  . Non-ST elevation myocardial infarction (NSTEMI) 06/07/2011  . Coronary atherosclerosis of native coronary artery 06/07/2011  . Hyperlipidemia 06/07/2011   Past Medical History:  Diagnosis Date  . Allergy   . Arthritis   . Asthma   . CAD (coronary artery disease)    NSTEMI 7/12:  Cardiac cath on 7/17: pLAD occluded, Dx 30-40%,  pRCA 30-40%, mRCA 30-40%.  Proximal LAD was treated with a BMS.  Echo 7/19:  EF 60-65%, mild LVH.    ETT-Myoview 6/14:  Inf thinning, no ischemia, EF 64%, normal study // Myoview 04/2020: EF 57, normal perfusion; Low Risk    . Cognitive communication deficit   . Colitis   . Diverticulosis   . Duodenitis    May 2012 with heme positive stools at this time. Endorses only rare streaking of blood now.  . External hemorrhoids   . GERD (gastroesophageal reflux disease)   . Heart murmur    as a child per the pt  . Hiatal hernia   . Hx of hemorrhoids   . Hyperlipidemia   . Hypertension   . Kidney stones   . Microadenoma    MRI in 2008 demonstrating 4.6 mm area of pituitary gland   . Migraines    Has been evaluated multiple times in past for chronic headache in which pt has had occasional nose bleeds and bloodshot eyes. This lasted for 4-5 months. CT of the head was negative in May 2012.  . Myocardial infarction ([place]) 05/17/2011  . Pituitary tumor   . RBBB (right bundle branch block)    Noted on EKG in 2008  . Ulcer    Past Surgical History:  Procedure Laterality Date  . BIOPSY  10/15/2021   Procedure: BIOPSY;  Surgeon: Jerene Bears, MD;  Location: [facility];  Service: Gastroenterology;;  .  COLONOSCOPY    . egd  03/05/2011   Dr. Benson Norway  . ESOPHAGOGASTRODUODENOSCOPY (EGD) WITH PROPOFOL N/A 10/15/2021   Procedure: ESOPHAGOGASTRODUODENOSCOPY (EGD) WITH PROPOFOL;  Surgeon: Jerene Bears, MD;  Location: [facility];  Service: Gastroenterology;  Laterality: N/A;  possible dilation  . FLEXIBLE SIGMOIDOSCOPY  03/05/2011   Dr. Benson Norway  . HAMMER TOE SURGERY    . Heart stent    . HERNIA REPAIR     In 20's  . MALONEY DILATION  10/15/2021   Procedure: MALONEY DILATION;  Surgeon: Jerene Bears, MD;  Location: [facility];  Service: Gastroenterology;;  52cm   . ORIF TIBIA PLATEAU Left 08/04/2018   Procedure: OPEN REDUCTION INTERNAL FIXATION (ORIF) TIBIAL PLATEAU;  Surgeon: Altamese Pony, MD;  Location:  [facility];  Service: Orthopedics;  Laterality: Left;  . SIGMOIDOSCOPY    . TOOTH EXTRACTION    . UPPER GASTROINTESTINAL ENDOSCOPY     Social History   Tobacco Use  . Smoking status: Never  . Smokeless tobacco: Never  Vaping Use  . Vaping Use: Never used  Substance Use Topics  . Alcohol use: No  . Drug use: No   Social History   Socioeconomic History  . Marital status: Single    Spouse name: Not on file  . Number of children: Not on file  . Years of education: Not on file  . Highest education level: Not on file  Occupational History  . Occupation: Personnel officer-- cleans 3 buildings    Comment: Fairly physical job    Employer: [employer]  Tobacco Use  . Smoking status: Never  . Smokeless tobacco: Never  Vaping Use  . Vaping Use: Never used  Substance and Sexual Activity  . Alcohol use: No  . Drug use: No  . Sexual activity: Not Currently  Other Topics Concern  . Not on file  Social History Narrative   Lives with brother and mother   Mother is quite sick, he and his brother take turns taking care of her   No children   Social Determinants of Health   Financial Resource Strain: Low Risk  (07/09/2021)   Overall Financial Resource Strain (CARDIA)   . Difficulty of Paying Living Expenses: Not hard at all  Food Insecurity: No Food Insecurity (07/09/2021)   Hunger Vital Sign   . Worried About Charity fundraiser in the Last Year: Never true   . Ran Out of Food in the Last Year: Never true  Transportation Needs: No Transportation Needs (07/09/2021)   PRAPARE - Transportation   . Lack of Transportation (Medical): No   . Lack of Transportation (Non-Medical): No  Physical Activity: Sufficiently Active (07/09/2021)   Exercise Vital Sign   . Days of Exercise per Week: 7 days   . Minutes of Exercise per Session: 30 min  Stress: No Stress Concern Present (07/09/2021)   [place]   . Feeling of Stress : Not at  all  Social Connections: Socially Isolated (07/09/2021)   Social Connection and Isolation Panel [NHANES]   . Frequency of Communication with Friends and Family: More than three times a week   . Frequency of Social Gatherings with Friends and Family: More than three times a week   . Attends Religious Services: Never   . Active Member of Clubs or Organizations: No   . Attends Archivist Meetings: Never   . Marital Status: Never married  Intimate Partner Violence: Not At Risk (07/09/2021)  Humiliation, Afraid, Rape, and Kick questionnaire   . Fear of Current or Ex-Partner: No   . Emotionally Abused: No   . Physically Abused: No   . Sexually Abused: No   Family Status  Relation Name Status  . Mother  Deceased       60 yoa  . Father  Deceased at age 38  . Brother  Alive  . Sister  Deceased at age birth  . [employer]  . PGM  (Not Specified)  . Neg Hx  (Not Specified)   Family History  Problem Relation Age of Onset  . Stroke Mother   . Heart attack Father 26       MI  . Skin cancer Father   . Cancer Father        ? type "rare"  . Obesity Brother   . Coronary artery disease Brother        Possible, pt not sure of specifics  . Hypertension Brother   . Colon polyps Maternal Aunt   . Colon cancer Maternal Aunt   . Colon polyps Paternal Grandmother   . Other Neg Hx   . Esophageal cancer Neg Hx   . Rectal cancer Neg Hx   . Stomach cancer Neg Hx    No Known Allergies    Review of Systems  Constitutional:  Negative for fever and malaise/fatigue.  HENT:  Negative for congestion.   Eyes:  Negative for blurred vision.  Respiratory:  Negative for cough and shortness of breath.   Cardiovascular:  Negative for chest pain, palpitations and leg swelling.  Gastrointestinal:  Negative for vomiting.  Musculoskeletal:  Negative for back pain.  Skin:  Negative for rash.  Neurological:  Negative for loss of consciousness and headaches.      Objective:     BP 118/72  (BP Location: Left Arm, Patient Position: Sitting, Cuff Size: Large)   Pulse 74   Temp 98.1 F (36.7 C) (Oral)   Resp 20   Ht '6\' 2"'$  (1.88 m)   Wt 279 lb 3.2 oz (126.6 kg)   SpO2 94%   BMI 35.85 kg/m  BP Readings from Last 3 Encounters:  06/25/22 118/72  01/27/22 140/84  12/25/21 124/80   Wt Readings from Last 3 Encounters:  06/25/22 279 lb 3.2 oz (126.6 kg)  01/27/22 293 lb 6.4 oz (133.1 kg)  12/25/21 290 lb 12.8 oz (131.9 kg)   SpO2 Readings from Last 3 Encounters:  06/25/22 94%  01/27/22 97%  12/25/21 93%      Physical Exam Vitals and nursing note reviewed.  Constitutional:      General: He is not in acute distress.    Appearance: He is well-developed. He is not diaphoretic.  HENT:     Head: Normocephalic and atraumatic.     Right Ear: External ear normal.     Left Ear: External ear normal.     Nose: Nose normal.     Mouth/Throat:     Pharynx: No oropharyngeal exudate.  Eyes:     General:        Right eye: No discharge.        Left eye: No discharge.     Conjunctiva/sclera: Conjunctivae normal.     Pupils: Pupils are equal, round, and reactive to light.  Neck:     Thyroid: No thyromegaly.     Vascular: No JVD.  Cardiovascular:     Rate and Rhythm: Normal rate and regular rhythm.  Heart sounds: No murmur heard.    No friction rub. No gallop.  Pulmonary:     Effort: Pulmonary effort is normal. No respiratory distress.     Breath sounds: Normal breath sounds. No wheezing or rales.  Chest:     Chest wall: No tenderness.  Abdominal:     General: Bowel sounds are normal. There is no distension.     Palpations: Abdomen is soft. There is no mass.     Tenderness: There is no abdominal tenderness. There is no guarding or rebound.  Musculoskeletal:        General: Tenderness present. No swelling.     Cervical back: Normal range of motion and neck supple.     Right knee: Decreased range of motion. Tenderness present over the lateral joint line.     Left knee:  Decreased range of motion. Tenderness present over the medial joint line and patellar tendon.  Lymphadenopathy:     Cervical: No cervical adenopathy.  Skin:    General: Skin is warm and dry.     Coloration: Skin is not pale.     Findings: Lesion and rash present. No erythema. Rash is crusting.          Comments: + round brown hard scaly spots --- on scalp and R side mid back   ? keratosis  Neurological:     Mental Status: He is alert and oriented to person, place, and time.     Motor: No abnormal muscle tone.     Deep Tendon Reflexes: Reflexes are normal and symmetric. Reflexes normal.  Psychiatric:        Behavior: Behavior normal.        Thought Content: Thought content normal.        Judgment: Judgment normal.     No results found for any visits on 06/25/22.  Last CBC Lab Results  Component Value Date   WBC 5.2 12/25/2021   HGB 14.6 12/25/2021   HCT 43.9 12/25/2021   MCV 97.3 12/25/2021   MCH 31.3 08/06/2018   RDW 14.0 12/25/2021   PLT 185.0 09/60/4540   Last metabolic panel Lab Results  Component Value Date   GLUCOSE 137 (H) 12/25/2021   NA 139 12/25/2021   K 4.2 12/25/2021   CL 101 12/25/2021   CO2 35 (H) 12/25/2021   BUN 14 12/25/2021   CREATININE 0.83 12/25/2021   GFRNONAA >60 08/05/2018   CALCIUM 9.7 12/25/2021   PHOS 3.9 08/05/2018   PROT 7.1 12/25/2021   ALBUMIN 4.6 12/25/2021   BILITOT 0.8 12/25/2021   ALKPHOS 55 12/25/2021   AST 78 (H) 12/25/2021   ALT 92 (H) 12/25/2021   ANIONGAP 9 08/05/2018   Last lipids Lab Results  Component Value Date   CHOL 134 12/25/2021   HDL 50.50 12/25/2021   LDLCALC 63 12/25/2021   TRIG 100.0 12/25/2021   CHOLHDL 3 12/25/2021   Last hemoglobin A1c Lab Results  Component Value Date   HGBA1C 8.5 (H) 12/25/2021   Last thyroid functions Lab Results  Component Value Date   TSH 1.87 07/29/2021   Last vitamin D Lab Results  Component Value Date   VD25OH 24.9 (L) 08/05/2018   Last vitamin B12 and  Folate No results found for: "VITAMINB12", "FOLATE"    The ASCVD Risk score (Arnett DK, et al., 2019) failed to calculate for the following reasons:   The patient has a prior MI or stroke diagnosis    Assessment & Plan:   Problem List  Items Addressed This Visit       Unprioritized   CAD (coronary artery disease)   Relevant Orders   CBC with Differential/Platelet   Comprehensive metabolic panel   Lipid panel   Hemoglobin A1c   PSA   TSH   Mild intermittent asthma   Pituitary adenoma ([place])   Relevant Orders   Prolactin   Ambulatory referral to Endocrinology   Primary osteoarthritis of both knees    F/u ortho       Relevant Medications   diclofenac Sodium (VOLTAREN) 1 % GEL   Other Relevant Orders   Ambulatory referral to Orthopedic Surgery   Preventative health care - Primary    ghm utd Check labs  See avs       OSA (obstructive sleep apnea)    Per pulm      Hypertension    Well controlled, no changes to meds. Encouraged heart healthy diet such as the DASH diet and exercise as tolerated.       Relevant Orders   CBC with Differential/Platelet   Comprehensive metabolic panel   Lipid panel   Hemoglobin A1c   PSA   TSH   Hyperprolactinemia ([facility])    F/u endo       Hyperlipidemia    Tolerating statin, encouraged heart healthy diet, avoid trans fats, minimize simple carbs and saturated fats. Increase exercise as tolerated      GERD (gastroesophageal reflux disease)    nexium daily      Relevant Medications   esomeprazole (NEXIUM) 40 MG capsule   Essential hypertension    Well controlled, no changes to meds. Encouraged heart healthy diet such as the DASH diet and exercise as tolerated.       Relevant Orders   CBC with Differential/Platelet   Comprehensive metabolic panel   Lipid panel   Hemoglobin A1c   PSA   TSH   Coronary atherosclerosis of native coronary artery    Check labs  F/u cardiology      Cognitive communication deficit     stable      Other Visit Diagnoses     Type 2 diabetes mellitus with hyperglycemia, without long-term current use of insulin ([place])       Relevant Orders   CBC with Differential/Platelet   Comprehensive metabolic panel   Lipid panel   Hemoglobin A1c   PSA   TSH   Ambulatory referral to Endocrinology   Hyperlipidemia associated with type 2 diabetes mellitus ([place])       Relevant Orders   CBC with Differential/Platelet   Comprehensive metabolic panel   Lipid panel   Hemoglobin A1c   PSA   TSH   Nevus       Relevant Orders   Ambulatory referral to Dermatology       Return in about 6 months (around 12/26/2022) for hypertension, hyperlipidemia, diabetes II.    [name], DO

## 2022-06-25 NOTE — Assessment & Plan Note (Signed)
Per pulm 

## 2022-06-26 LAB — PROLACTIN: Prolactin: 4.1 ng/mL (ref 2.0–18.0)

## 2022-06-27 DIAGNOSIS — G4733 Obstructive sleep apnea (adult) (pediatric): Secondary | ICD-10-CM | POA: Diagnosis not present

## 2022-07-04 DIAGNOSIS — G4733 Obstructive sleep apnea (adult) (pediatric): Secondary | ICD-10-CM | POA: Diagnosis not present

## 2022-07-12 ENCOUNTER — Telehealth: Payer: Self-pay | Admitting: Family Medicine

## 2022-07-12 NOTE — Telephone Encounter (Signed)
Pt would like to go over labs.

## 2022-07-13 NOTE — Telephone Encounter (Signed)
Pt called. Went over labs. Pt verbalized understanding

## 2022-07-14 ENCOUNTER — Ambulatory Visit (INDEPENDENT_AMBULATORY_CARE_PROVIDER_SITE_OTHER): Payer: Medicare HMO | Admitting: *Deleted

## 2022-07-14 VITALS — BP 106/64 | HR 68 | Ht 74.0 in | Wt 279.4 lb

## 2022-07-14 DIAGNOSIS — Z0000 Encounter for general adult medical examination without abnormal findings: Secondary | ICD-10-CM

## 2022-07-14 NOTE — Progress Notes (Addendum)
Subjective:   Michael Pearson is a 61 y.o. male who presents for Medicare Annual/Subsequent preventive examination.  Review of Systems    Defer to PCP Cardiac Risk Factors include: advanced age (>79mn, >>70women);diabetes mellitus;dyslipidemia;male gender;hypertension     Objective:    Today's Vitals   07/14/22 1408  BP: 106/64  Pulse: 68  Weight: 279 lb 6.4 oz (126.7 kg)  Height: '6\' 2"'$  (1.88 m)   Body mass index is 35.87 kg/m.     07/14/2022    2:13 PM 10/15/2021    8:49 AM 07/09/2021    3:47 PM 05/20/2021    8:53 PM 08/04/2018    5:00 PM 08/04/2018    8:50 AM 08/03/2018    3:08 PM  Advanced Directives  Does Patient Have a Medical Advance Directive? No No No No No No No  Would patient like information on creating a medical advance directive? No - Patient declined No - Patient declined No - Patient declined No - Patient declined No - Patient declined No - Patient declined No - Patient declined    Current Medications (verified) Outpatient Encounter Medications as of 07/14/2022  Medication Sig   albuterol (PROVENTIL HFA;VENTOLIN HFA) 108 (90 Base) MCG/ACT inhaler Inhale 2 puffs into the lungs every 4 (four) hours as needed for wheezing or shortness of breath.   aspirin EC 81 MG tablet Take 81 mg by mouth in the morning. Swallow whole.   atorvastatin (LIPITOR) 40 MG tablet Take 1 tablet (40 mg total) by mouth daily.   cabergoline (DOSTINEX) 0.5 MG tablet Take 1 tablet (0.5 mg total) by mouth 2 (two) times a week.   cetirizine (ZYRTEC) 10 MG tablet Take 10 mg by mouth daily as needed for allergies.    diclofenac Sodium (VOLTAREN) 1 % GEL Apply 4 g topically 4 (four) times daily.   docusate sodium (COLACE) 100 MG capsule Take 100 mg by mouth 2 (two) times daily as needed (constipation.).   esomeprazole (NEXIUM) 40 MG capsule Take 1 capsule (40 mg total) by mouth 2 (two) times daily before a meal.   ezetimibe (ZETIA) 10 MG tablet Take 1 tablet (10 mg total) by mouth daily.    fluticasone furoate-vilanterol (BREO ELLIPTA) 200-25 MCG/INH AEPB Inhale 1 puff into the lungs daily. (Patient taking differently: Inhale 1 puff into the lungs daily as needed (respiratory issues.).)   losartan (COZAAR) 50 MG tablet Take 1 tablet (50 mg total) by mouth daily.   metFORMIN (GLUCOPHAGE-XR) 500 MG 24 hr tablet Take 2 tablets (1,000 mg total) by mouth daily. (Patient taking differently: Take 500 mg by mouth daily.)   Multiple Vitamin (MULTIVITAMIN WITH MINERALS) TABS tablet Take 1 tablet by mouth in the morning.   Semaglutide (RYBELSUS) 3 MG TABS Take 3 mg by mouth daily.   triamcinolone cream (KENALOG) 0.1 % Apply 1 application topically 2 (two) times daily. (Patient taking differently: Apply 1 application  topically 2 (two) times daily as needed (skin irritation/skin folds).)   No facility-administered encounter medications on file as of 07/14/2022.    Allergies (verified) Patient has no known allergies.   History: Past Medical History:  Diagnosis Date   Allergy    Arthritis    Asthma    CAD (coronary artery disease)    NSTEMI 7/12:  Cardiac cath on 7/17: pLAD occluded, Dx 30-40%, pRCA 30-40%, mRCA 30-40%.  Proximal LAD was treated with a BMS.  Echo 7/19:  EF 60-65%, mild LVH.    ETT-Myoview 6/14:  Inf thinning,  no ischemia, EF 64%, normal study // Myoview 04/2020: EF 57, normal perfusion; Low Risk     Cognitive communication deficit    Colitis    Diverticulosis    Duodenitis    May 2012 with heme positive stools at this time. Endorses only rare streaking of blood now.   External hemorrhoids    GERD (gastroesophageal reflux disease)    Heart murmur    as a child per the pt   Hiatal hernia    Hx of hemorrhoids    Hyperlipidemia    Hypertension    Kidney stones    Microadenoma    MRI in 2008 demonstrating 4.6 mm area of pituitary gland    Migraines    Has been evaluated multiple times in past for chronic headache in which pt has had occasional nose bleeds and bloodshot  eyes. This lasted for 4-5 months. CT of the head was negative in May 2012.   Myocardial infarction ([place]) 05/17/2011   Pituitary tumor    RBBB (right bundle branch block)    Noted on EKG in 2008   Ulcer    Past Surgical History:  Procedure Laterality Date   BIOPSY  10/15/2021   Procedure: BIOPSY;  Surgeon: Jerene Bears, MD;  Location: [facility];  Service: Gastroenterology;;   COLONOSCOPY     egd  03/05/2011   Dr. Benson [place]   ESOPHAGOGASTRODUODENOSCOPY (EGD) WITH PROPOFOL N/A 10/15/2021   Procedure: ESOPHAGOGASTRODUODENOSCOPY (EGD) WITH PROPOFOL;  Surgeon: Jerene Bears, MD;  Location: [facility];  Service: Gastroenterology;  Laterality: N/A;  possible dilation   FLEXIBLE SIGMOIDOSCOPY  03/05/2011   Dr. Benson [place]   HAMMER TOE SURGERY     Heart stent     HERNIA REPAIR     In 20's   [place] DILATION  10/15/2021   Procedure: MALONEY DILATION;  Surgeon: Jerene Bears, MD;  Location: [facility];  Service: Gastroenterology;;  52cm    ORIF TIBIA PLATEAU Left 08/04/2018   Procedure: OPEN REDUCTION INTERNAL FIXATION (ORIF) TIBIAL PLATEAU;  Surgeon: Altamese North Powder, MD;  Location: [facility];  Service: Orthopedics;  Laterality: Left;   SIGMOIDOSCOPY     TOOTH EXTRACTION     UPPER GASTROINTESTINAL ENDOSCOPY     Family History  Problem Relation Age of Onset   Stroke Mother    Heart attack Father 56       MI   Skin cancer Father    Cancer Father        ? type "rare"   Obesity Brother    Coronary artery disease Brother        Possible, pt not sure of specifics   Hypertension Brother    Colon polyps Maternal Aunt    Colon cancer Maternal Aunt    Colon polyps Paternal Grandmother    Other Neg Hx    Esophageal cancer Neg Hx    Rectal cancer Neg Hx    Stomach cancer Neg Hx    Social History   Socioeconomic History   Marital status: Single    Spouse name: Not on file   Number of children: Not on file   Years of education: Not on file   Highest education level: Not on file  Occupational History    Occupation: Personnel officer-- cleans 3 buildings    Comment: Fairly physical job    Employer: [employer]  Tobacco Use   Smoking status: Never   Smokeless tobacco: Never  Vaping Use   Vaping Use: Never used  Substance and  Sexual Activity   Alcohol use: No   Drug use: No   Sexual activity: Not Currently  Other Topics Concern   Not on file  Social History Narrative   Lives with brother and mother   Mother is quite sick, he and his brother take turns taking care of her   No children   Social Determinants of Health   Financial Resource Strain: Low Risk  (07/09/2021)   Overall Financial Resource Strain (CARDIA)    Difficulty of Paying Living Expenses: Not hard at all  Food Insecurity: No Food Insecurity (07/09/2021)   Hunger Vital Sign    Worried About Running Out of Food in the Last Year: Never true    [place] in the Last Year: Never true  Transportation Needs: No Transportation Needs (07/09/2021)   PRAPARE - Hydrologist (Medical): No    Lack of Transportation (Non-Medical): No  Physical Activity: Sufficiently Active (07/09/2021)   Exercise Vital Sign    Days of Exercise per Week: 7 days    Minutes of Exercise per Session: 30 min  Stress: No Stress Concern Present (07/09/2021)   [place]    Feeling of Stress : Not at all  Social Connections: Socially Isolated (07/09/2021)   Social Connection and Isolation Panel [NHANES]    Frequency of Communication with Friends and Family: More than three times a week    Frequency of Social Gatherings with Friends and Family: More than three times a week    Attends Religious Services: Never    Marine scientist or Organizations: No    Attends Music therapist: Never    Marital Status: Never married    Tobacco Counseling Counseling given: Not Answered   Clinical Intake:  Pre-visit preparation completed: Yes  Pain  : No/denies pain     Diabetes: Yes CBG done?: No Did pt. bring in CBG monitor from home?: No  How often do you need to have someone help you when you read instructions, pamphlets, or other written materials from your doctor or pharmacy?: 1 - Never  Diabetic? Yes Nutrition Risk Assessment:  Has the patient had any N/V/D within the last 2 months?  No  Does the patient have any non-healing wounds?  No  Has the patient had any unintentional weight loss or weight gain?  Yes   Diabetes:  Is the patient diabetic?  Yes  If diabetic, was a CBG obtained today?  No  Did the patient bring in their glucometer from home?  No  How often do you monitor your CBG's? Never   Financial Strains and Diabetes Management:  Are you having any financial strains with the device, your supplies or your medication? No .  Does the patient want to be seen by Chronic Care Management for management of their diabetes?  No  Would the patient like to be referred to a Nutritionist or for Diabetic Management?  No   Diabetic Exams:  Diabetic Eye Exam: Overdue for diabetic eye exam. Pt has been advised about the importance in completing this exam. Patient advised to call and schedule an eye exam. Diabetic Foot Exam: Overdue, Pt has been advised about the importance in completing this exam. Pt is scheduled for diabetic foot exam on N/a.   Interpreter Needed?: No  Information entered by :: Beatris Ship, CMA   Activities of Daily Living    07/14/2022    2:16 PM  In your present state of health, do you have any difficulty performing the following activities:  Hearing? 1  Vision? 0  Difficulty concentrating or making decisions? 0  Walking or climbing stairs? 1  Comment knee arthritis  Dressing or bathing? 0  Doing errands, shopping? 0  Preparing Food and eating ? N  Using the Toilet? N  In the past six months, have you accidently leaked urine? Y  Do you have problems with loss of bowel control? Y  Managing  your Medications? N  Managing your Finances? N  Housekeeping or managing your Housekeeping? N    Patient Care Team: Carollee Herter, Alferd Apa, DO as PCP - General (Family Medicine) Sherren Mocha, MD as PCP - Cardiology (Cardiology) Hillary Bow, MD as Consulting Physician (Cardiology) Renato Shin, MD (Inactive) as Consulting Physician (Endocrinology) Altamese Klondike, MD as Consulting Physician (Orthopedic Surgery) Dermatology, Verdene Lennert, Ernesto Rutherford, MD as Consulting Physician (Pulmonary Disease)  Indicate any recent Medical Services you may have received from other than [facility] providers in the past year (date may be approximate).     Assessment:   This is a routine wellness examination for [place].  Hearing/Vision screen No results found.  Dietary issues and exercise activities discussed: Current Exercise Habits: Home exercise routine, Type of exercise: walking, Time (Minutes): 30, Frequency (Times/Week): 7, Weekly Exercise (Minutes/Week): 210, Intensity: Mild, Exercise limited by: None identified   Goals Addressed   None    Depression Screen    07/14/2022    2:14 PM 07/09/2021    3:50 PM 06/08/2021    8:44 AM 11/24/2017    9:39 AM 11/30/2016    6:08 PM 11/23/2016    9:34 AM 11/03/2012    8:33 AM  PHQ 2/9 Scores  PHQ - 2 Score 0 0 2 0 0 0 0  PHQ- 9 Score   2        Fall Risk    07/14/2022    2:14 PM 07/09/2021    3:49 PM 06/08/2021    8:44 AM 11/24/2017    9:39 AM 11/30/2016    6:08 PM  [place] in the past year? 0 0 0 Yes Yes  Number falls in past yr: 0 0 0 1 1  Injury with Fall? 0 0 0 No No  Risk for fall due to : No Fall Risks    Impaired balance/gait;History of fall(s)  Follow up Falls evaluation completed Falls prevention discussed   Falls prevention discussed    FALL RISK PREVENTION PERTAINING TO THE HOME:  Any stairs in or around the home? Yes  If so, are there any without handrails? No  Home free of loose throw rugs in walkways, pet beds,  electrical cords, etc? Yes  Adequate lighting in your home to reduce risk of falls? Yes   ASSISTIVE DEVICES UTILIZED TO PREVENT FALLS:  Life alert? No  Use of a cane, walker or w/c? No  Grab bars in the bathroom? Yes  Shower chair or bench in shower? No  Elevated toilet seat or a handicapped toilet? No   TIMED UP AND GO:  Was the test performed? Yes .  Length of time to ambulate 10 feet: 8 sec.   Gait slow and steady without use of assistive device  Cognitive Function:        07/14/2022    2:20 PM  6CIT Screen  What Year? 0 points  What month? 0 points  What time? 0 points  Count back from  20 2 points  Months in reverse 4 points  Repeat phrase 6 points  Total Score 12 points    Immunizations Immunization History  Administered Date(s) Administered   Influenza Whole 09/01/2012   Influenza,inj,Quad PF,6+ Mos 09/17/2014, 08/14/2015, 11/23/2016, 08/05/2018, 08/15/2019, 07/31/2020   Influenza-Unspecified 09/16/2021   Janssen (J&J) SARS-COV-2 Vaccination 08/05/2020   Pneumococcal Conjugate-13 09/17/2014   Tdap 05/15/2012, 12/28/2015    TDAP status: Up to date  Flu Vaccine status: Up to date  Pneumococcal vaccine status: Due, Education has been provided regarding the importance of this vaccine. Advised may receive this vaccine at local pharmacy or Health Dept. Aware to provide a copy of the vaccination record if obtained from local pharmacy or Health Dept. Verbalized acceptance and understanding.  Covid-19 vaccine status: Information provided on how to obtain vaccines.   Qualifies for Shingles Vaccine? Yes   Zostavax completed No   Shingrix Completed?: No.    Education has been provided regarding the importance of this vaccine. Patient has been advised to call insurance company to determine out of pocket expense if they have not yet received this vaccine. Advised may also receive vaccine at local pharmacy or Health Dept. Verbalized acceptance and  understanding.  Screening Tests Health Maintenance  Topic Date Due   Zoster Vaccines- Shingrix (1 of 2) Never done   COVID-19 Vaccine (2 - Booster for Janssen series) 09/30/2020   INFLUENZA VACCINE  01/31/2023 (Originally 06/01/2022)   Diabetic kidney evaluation - Urine ACR  12/25/2022   Diabetic kidney evaluation - GFR measurement  06/26/2023   TETANUS/TDAP  12/27/2025   COLONOSCOPY (Pts 45-73yr Insurance coverage will need to be confirmed)  06/26/2028   Hepatitis C Screening  Completed   HIV Screening  Completed   HPV VACCINES  Aged Out    Health Maintenance  Health Maintenance Due  Topic Date Due   Zoster Vaccines- Shingrix (1 of 2) Never done   COVID-19 Vaccine (2 - Booster for Janssen series) 09/30/2020    Colorectal cancer screening: Type of screening: Colonoscopy. Completed 06/26/18. Repeat every 10 years  Lung Cancer Screening: (Low Dose CT Chest recommended if Age 61-80years, 30 pack-year currently smoking OR have quit w/in 15years.) does not qualify.   Lung Cancer Screening Referral: N/a  Additional Screening:  Hepatitis C Screening: does qualify; Completed 01/07/21  Vision Screening: Recommended annual ophthalmology exams for early detection of glaucoma and other disorders of the eye. Is the patient up to date with their annual eye exam?  No  Who is the provider or what is the name of the office in which the patient attends annual eye exams? N/a If pt is not established with a provider, would they like to be referred to a provider to establish care? No .   Dental Screening: Recommended annual dental exams for proper oral hygiene  Community Resource Referral / Chronic Care Management: CRR required this visit?  No   CCM required this visit?  No      Plan:     I have personally reviewed and noted the following in the patient's chart:   Medical and social history Use of alcohol, tobacco or illicit drugs  Current medications and supplements including opioid  prescriptions. Patient is not currently taking opioid prescriptions. Functional ability and status Nutritional status Physical activity Advanced directives List of other physicians Hospitalizations, surgeries, and ER visits in previous 12 months Vitals Screenings to include cognitive, depression, and falls Referrals and appointments  In addition, I have reviewed and discussed with  patient certain preventive protocols, quality metrics, and best practice recommendations. A written personalized care plan for preventive services as well as general preventive health recommendations were provided to patient.     Beatris Ship, [place]   07/14/2022   Nurse Notes: None   I have reviewed and agree with Health Coaches documentation.  Kathlene November, MD

## 2022-07-14 NOTE — Patient Instructions (Signed)
Michael Pearson , Thank you for taking time to come for your Medicare Wellness Visit. I appreciate your ongoing commitment to your health goals. Please review the following plan we discussed and let me know if I can assist you in the future.   These are the goals we discussed:  Goals      Patient Stated     Continue eating healthy foods        This is a list of the screening recommended for you and due dates:  Health Maintenance  Topic Date Due   Zoster (Shingles) Vaccine (1 of 2) Never done   COVID-19 Vaccine (2 - Booster for [employer] series) 09/30/2020   Flu Shot  01/31/2023*   Yearly kidney health urinalysis for diabetes  12/25/2022   Yearly kidney function blood test for diabetes  06/26/2023   Tetanus Vaccine  12/27/2025   Colon Cancer Screening  06/26/2028   Hepatitis C Screening: USPSTF Recommendation to screen - Ages 18-79 yo.  Completed   HIV Screening  Completed   HPV Vaccine  Aged Out  *Topic was postponed. The date shown is not the original due date.      Next appointment: Follow up in one year for your annual wellness visit   Preventive Care 40-64 Years, Male Preventive care refers to lifestyle choices and visits with your health care provider that can promote health and wellness. What does preventive care include? A yearly physical exam. This is also called an annual well check. Dental exams once or twice a year. Routine eye exams. Ask your health care provider how often you should have your eyes checked. Personal lifestyle choices, including: Daily care of your teeth and gums. Regular physical activity. Eating a healthy diet. Avoiding tobacco and drug use. Limiting alcohol use. Practicing safe sex. Taking low-dose aspirin every day starting at age 28. What happens during an annual well check? The services and screenings done by your health care provider during your annual well check will depend on your age, overall health, lifestyle risk factors, and family  history of disease. Counseling  Your health care provider may ask you questions about your: Alcohol use. Tobacco use. Drug use. Emotional well-being. Home and relationship well-being. Sexual activity. Eating habits. Work and work Statistician. Screening  You may have the following tests or measurements: Height, weight, and BMI. Blood pressure. Lipid and cholesterol levels. These may be checked every 5 years, or more frequently if you are over 17 years old. Skin check. Lung cancer screening. You may have this screening every year starting at age 63 if you have a 30-pack-year history of smoking and currently smoke or have quit within the past 15 years. Fecal occult blood test (FOBT) of the stool. You may have this test every year starting at age 47. Flexible sigmoidoscopy or colonoscopy. You may have a sigmoidoscopy every 5 years or a colonoscopy every 10 years starting at age 2. Prostate cancer screening. Recommendations will vary depending on your family history and other risks. Hepatitis C blood test. Hepatitis B blood test. Sexually transmitted disease (STD) testing. Diabetes screening. This is done by checking your blood sugar (glucose) after you have not eaten for a while (fasting). You may have this done every 1-3 years. Discuss your test results, treatment options, and if necessary, the need for more tests with your health care provider. Vaccines  Your health care provider may recommend certain vaccines, such as: Influenza vaccine. This is recommended every year. Tetanus, diphtheria, and acellular pertussis (Tdap,  Td) vaccine. You may need a Td booster every 10 years. Zoster vaccine. You may need this after age 88. Pneumococcal 13-valent conjugate (PCV13) vaccine. You may need this if you have certain conditions and have not been vaccinated. Pneumococcal polysaccharide (PPSV23) vaccine. You may need one or two doses if you smoke cigarettes or if you have certain  conditions. Talk to your health care provider about which screenings and vaccines you need and how often you need them. This information is not intended to replace advice given to you by your health care provider. Make sure you discuss any questions you have with your health care provider. Document Released: 11/14/2015 Document Revised: 07/07/2016 Document Reviewed: 08/19/2015 Elsevier Interactive Patient Education  2017 [place] Prevention in the Home Falls can cause injuries. They can happen to people of all ages. There are many things you can do to make your home safe and to help prevent falls. What can I do on the outside of my home? Regularly fix the edges of walkways and driveways and fix any cracks. Remove anything that might make you trip as you walk through a door, such as a raised step or threshold. Trim any bushes or trees on the path to your home. Use bright outdoor lighting. Clear any walking paths of anything that might make someone trip, such as rocks or tools. Regularly check to see if handrails are loose or broken. Make sure that both sides of any steps have handrails. Any raised decks and porches should have guardrails on the edges. Have any leaves, snow, or ice cleared regularly. Use sand or salt on walking paths during winter. Clean up any spills in your garage right away. This includes oil or grease spills. What can I do in the bathroom? Use night lights. Install grab bars by the toilet and in the tub and shower. Do not use towel bars as grab bars. Use non-skid mats or decals in the tub or shower. If you need to sit down in the shower, use a plastic, non-slip stool. Keep the floor dry. Clean up any water that spills on the floor as soon as it happens. Remove soap buildup in the tub or shower regularly. Attach bath mats securely with double-sided non-slip rug tape. Do not have throw rugs and other things on the floor that can make you trip. What can I do in  the bedroom? Use night lights. Make sure that you have a light by your bed that is easy to reach. Do not use any sheets or blankets that are too big for your bed. They should not hang down onto the floor. Have a firm chair that has side arms. You can use this for support while you get dressed. Do not have throw rugs and other things on the floor that can make you trip. What can I do in the kitchen? Clean up any spills right away. Avoid walking on wet floors. Keep items that you use a lot in easy-to-reach places. If you need to reach something above you, use a strong step stool that has a grab bar. Keep electrical cords out of the way. Do not use floor polish or wax that makes floors slippery. If you must use wax, use non-skid floor wax. Do not have throw rugs and other things on the floor that can make you trip. What can I do with my stairs? Do not leave any items on the stairs. Make sure that there are handrails on both sides of the  stairs and use them. Fix handrails that are broken or loose. Make sure that handrails are as long as the stairways. Check any carpeting to make sure that it is firmly attached to the stairs. Fix any carpet that is loose or worn. Avoid having throw rugs at the top or bottom of the stairs. If you do have throw rugs, attach them to the floor with carpet tape. Make sure that you have a light switch at the top of the stairs and the bottom of the stairs. If you do not have them, ask someone to add them for you. What else can I do to help prevent falls? Wear shoes that: Do not have high heels. Have rubber bottoms. Are comfortable and fit you well. Are closed at the toe. Do not wear sandals. If you use a stepladder: Make sure that it is fully opened. Do not climb a closed stepladder. Make sure that both sides of the stepladder are locked into place. Ask someone to hold it for you, if possible. Clearly mark and make sure that you can see: Any grab bars or  handrails. First and last steps. Where the edge of each step is. Use tools that help you move around (mobility aids) if they are needed. These include: Canes. Walkers. Scooters. Crutches. Turn on the lights when you go into a dark area. Replace any light bulbs as soon as they burn out. Set up your furniture so you have a clear path. Avoid moving your furniture around. If any of your floors are uneven, fix them. If there are any pets around you, be aware of where they are. Review your medicines with your doctor. Some medicines can make you feel dizzy. This can increase your chance of falling. Ask your doctor what other things that you can do to help prevent falls. This information is not intended to replace advice given to you by your health care provider. Make sure you discuss any questions you have with your health care provider. Document Released: 08/14/2009 Document Revised: 03/25/2016 Document Reviewed: 11/22/2014 Elsevier Interactive Patient Education  2017 [employer].

## 2022-07-26 DIAGNOSIS — M25562 Pain in left knee: Secondary | ICD-10-CM | POA: Diagnosis not present

## 2022-07-26 DIAGNOSIS — M5136 Other intervertebral disc degeneration, lumbar region: Secondary | ICD-10-CM | POA: Diagnosis not present

## 2022-07-26 DIAGNOSIS — M545 Low back pain, unspecified: Secondary | ICD-10-CM | POA: Diagnosis not present

## 2022-07-26 DIAGNOSIS — M25561 Pain in right knee: Secondary | ICD-10-CM | POA: Diagnosis not present

## 2022-08-03 DIAGNOSIS — G4733 Obstructive sleep apnea (adult) (pediatric): Secondary | ICD-10-CM | POA: Diagnosis not present

## 2022-08-11 DIAGNOSIS — M79672 Pain in left foot: Secondary | ICD-10-CM | POA: Diagnosis not present

## 2022-08-11 DIAGNOSIS — M79671 Pain in right foot: Secondary | ICD-10-CM | POA: Diagnosis not present

## 2022-08-11 DIAGNOSIS — L851 Acquired keratosis [keratoderma] palmaris et plantaris: Secondary | ICD-10-CM | POA: Diagnosis not present

## 2022-08-11 DIAGNOSIS — M2041 Other hammer toe(s) (acquired), right foot: Secondary | ICD-10-CM | POA: Diagnosis not present

## 2022-08-18 ENCOUNTER — Telehealth: Payer: Self-pay | Admitting: Family Medicine

## 2022-08-18 NOTE — Telephone Encounter (Signed)
Noted  

## 2022-08-18 NOTE — Telephone Encounter (Signed)
Nurse Assessment Nurse: D'Heur Lucia Gaskins, RN, Adrienne Date/Time (Eastern Time): 08/18/2022 8:25:49 AM Confirm and document reason for call. If symptomatic, describe symptoms. ---Caller states when he lays down the room will spin and then when he stands he gets dizzy. Symptoms started last week. It lasts about 5 minutes each time. Does the patient have any new or worsening symptoms? ---Yes Will a triage be completed? ---Yes Related visit to physician within the last 2 weeks? ---No Does the PT have any chronic conditions? (i.e. diabetes, asthma, this includes High risk factors for pregnancy, etc.) ---Yes List chronic conditions. ---Chronic ear problems, HTN, diabetes, Hx of MI in 2012 Is this a behavioral health or substance abuse call? ---No Guidelines Guideline Title Affirmed Question Affirmed Notes Nurse Date/Time (Eastern Time) Dizziness - Vertigo [1] NO dizziness now AND [2] one or more stroke risk factors (i.e., hypertension, diabetes, prior stroke/ TIA/heart attack) D'Heur Lucia Gaskins, RN, Adrienne 08/18/2022 8:27:34 AM Disp. Time Eilene Ghazi Time) Disposition Final User 08/18/2022 8:31:38 AM See PCP within 24 Hours Yes D'Heur Lucia Gaskins, RN, Vincente Liberty PLEASE NOTE: All timestamps contained within this report are represented as [place] Standard Time. CONFIDENTIALTY NOTICE: This fax transmission is intended only for the addressee. It contains information that is legally privileged, confidential or otherwise protected from use or disclosure. If you are not the intended recipient, you are strictly prohibited from reviewing, disclosing, copying using or disseminating any of this information or taking any action in reliance on or regarding this information. If you have received this fax in error, please notify us immediately by telephone so that we can arrange for its return to [place]. Phone: [phone], Toll-Free: [phone], Fax: [phone] Page: 2 of 2 Call Id: [id number] Final  Disposition 08/18/2022 8:31:38 AM See PCP within 24 Hours Yes D'Heur Lucia Gaskins, RN, Vincente Liberty Caller Disagree/Comply Comply Caller Understands Yes PreDisposition Call Doctor Care Advice Given Per Guideline SEE PCP WITHIN 24 HOURS: * IF OFFICE WILL BE OPEN: You need to be examined within the next 24 hours. Call your doctor (or NP/PA) when the office opens and make an appointment. FALL PREVENTION: * Sit at side of bed for several minutes before standing up. Stand up slowly. * Avoid sudden movements of head. CALL BACK IF: * Severe headache occurs * Weakness develops in an arm or leg * Unable to walk without falling * Dizziness becomes constant * You become worse CARE ADVICE given per Dizziness - Vertigo (Adult) guideline. Referrals REFERRED TO PCP OFFICE

## 2022-08-18 NOTE — Telephone Encounter (Signed)
Pt called stating he was experiencing the following symptoms:  -When getting up or sitting down, feels like the room is spinning  -Loss of balance if he stands up too quick  -No Headaches  Pt was transferred to triage nurse for further eval.

## 2022-08-18 NOTE — Telephone Encounter (Signed)
Appointment w/ Dr. Larose Kells tomorrow.

## 2022-08-19 ENCOUNTER — Ambulatory Visit (INDEPENDENT_AMBULATORY_CARE_PROVIDER_SITE_OTHER): Payer: Medicare HMO | Admitting: Internal Medicine

## 2022-08-19 ENCOUNTER — Encounter: Payer: Self-pay | Admitting: Internal Medicine

## 2022-08-19 VITALS — BP 134/70 | HR 70 | Temp 97.5°F | Resp 16 | Ht 74.0 in | Wt 274.5 lb

## 2022-08-19 DIAGNOSIS — R42 Dizziness and giddiness: Secondary | ICD-10-CM

## 2022-08-19 MED ORDER — MECLIZINE HCL 25 MG PO TABS: 25.0000 mg | ORAL_TABLET | Freq: Three times a day (TID) | ORAL | 0 refills | Status: AC | PRN

## 2022-08-19 NOTE — Progress Notes (Signed)
Subjective:    Patient ID: Michael Pearson, male    DOB: July 26, 1961, 61 y.o.   MRN: [id number]  DOS:  08/19/2022 Type of visit - description: Acute  Patient presents with 5 days history of dizziness: Described that he has a sensation of spinning every time he lays down in bed or if he gets up quickly. Typically he stays quiet and close his eyes and symptoms resolve few minutes later. He had similar but less intense symptoms 2 years ago.  No associated chest pain or difficulty breathing.  No palpitations. No nausea or vomiting.  No blood in the stools.  No recent ibuprofen intake. No diplopia, slurred speech or unusual motor problems.  Review of Systems See above   Past Medical History:  Diagnosis Date   Allergy    Arthritis    Asthma    CAD (coronary artery disease)    NSTEMI 7/12:  Cardiac cath on 7/17: pLAD occluded, Dx 30-40%, pRCA 30-40%, mRCA 30-40%.  Proximal LAD was treated with a BMS.  Echo 7/19:  EF 60-65%, mild LVH.    ETT-Myoview 6/14:  Inf thinning, no ischemia, EF 64%, normal study // Myoview 04/2020: EF 57, normal perfusion; Low Risk     Cognitive communication deficit    Colitis    Diverticulosis    Duodenitis    May 2012 with heme positive stools at this time. Endorses only rare streaking of blood now.   External hemorrhoids    GERD (gastroesophageal reflux disease)    Heart murmur    as a child per the pt   Hiatal hernia    Hx of hemorrhoids    Hyperlipidemia    Hypertension    Kidney stones    Microadenoma    MRI in 2008 demonstrating 4.6 mm area of pituitary gland    Migraines    Has been evaluated multiple times in past for chronic headache in which pt has had occasional nose bleeds and bloodshot eyes. This lasted for 4-5 months. CT of the head was negative in May 2012.   Myocardial infarction ([place]) 05/17/2011   Pituitary tumor    RBBB (right bundle branch block)    Noted on EKG in 2008   Ulcer     Past Surgical History:  Procedure Laterality Date    BIOPSY  10/15/2021   Procedure: BIOPSY;  Surgeon: Jerene Bears, MD;  Location: [facility];  Service: Gastroenterology;;   COLONOSCOPY     egd  03/05/2011   Dr. Benson [place]   ESOPHAGOGASTRODUODENOSCOPY (EGD) WITH PROPOFOL N/A 10/15/2021   Procedure: ESOPHAGOGASTRODUODENOSCOPY (EGD) WITH PROPOFOL;  Surgeon: Jerene Bears, MD;  Location: [facility];  Service: Gastroenterology;  Laterality: N/A;  possible dilation   FLEXIBLE SIGMOIDOSCOPY  03/05/2011   Dr. Benson [place]   HAMMER TOE SURGERY     Heart stent     HERNIA REPAIR     In 20's   [place] DILATION  10/15/2021   Procedure: MALONEY DILATION;  Surgeon: Jerene Bears, MD;  Location: [facility];  Service: Gastroenterology;;  52cm    ORIF TIBIA PLATEAU Left 08/04/2018   Procedure: OPEN REDUCTION INTERNAL FIXATION (ORIF) TIBIAL PLATEAU;  Surgeon: Altamese Sheldon, MD;  Location: [facility];  Service: Orthopedics;  Laterality: Left;   SIGMOIDOSCOPY     TOOTH EXTRACTION     UPPER GASTROINTESTINAL ENDOSCOPY      Current Outpatient Medications  Medication Instructions   albuterol (PROVENTIL HFA;VENTOLIN HFA) 108 (90 Base) MCG/ACT inhaler 2 puffs, Inhalation, Every 4  hours PRN   aspirin EC 81 mg, Oral, Every morning, Swallow whole.   atorvastatin (LIPITOR) 40 mg, Oral, Daily   cabergoline (DOSTINEX) 0.5 mg, Oral, 2 times weekly   cetirizine (ZYRTEC) 10 mg, Oral, Daily PRN   diclofenac Sodium (VOLTAREN) 4 g, Topical, 4 times daily   docusate sodium (COLACE) 100 mg, Oral, 2 times daily PRN   esomeprazole (NEXIUM) 40 mg, Oral, 2 times daily before meals   ezetimibe (ZETIA) 10 mg, Oral, Daily   fluticasone furoate-vilanterol (BREO ELLIPTA) 200-25 MCG/INH AEPB 1 puff, Inhalation, Daily   losartan (COZAAR) 50 mg, Oral, Daily   metFORMIN (GLUCOPHAGE-XR) 1,000 mg, Oral, Daily   Multiple Vitamin (MULTIVITAMIN WITH MINERALS) TABS tablet 1 tablet, Oral, Every morning   Rybelsus 3 mg, Oral, Daily   triamcinolone cream (KENALOG) 0.1 % 1 application , Topical, 2 times  daily       Objective:   Physical Exam BP 134/70   Pulse 70   Temp (!) 97.5 F (36.4 C) (Oral)   Resp 16   Ht '6\' 2"'$  (1.88 m)   Wt 274 lb 8 oz (124.5 kg)   SpO2 94%   BMI 35.24 kg/m  General:   Well developed, NAD, BMI noted. HEENT:  Normocephalic . Face symmetric, atraumatic Neck: Normal carotid pulses Lungs:  CTA B Normal respiratory effort, no intercostal retractions, no accessory muscle use. Heart: RRR,  no murmur.  Lower extremities: no pretibial edema bilaterally  Skin: Not pale. Not jaundice Neurologic:  alert & oriented X3.  Speech normal, gait appropriate for age and unassisted. Motor symmetric.  Face symmetric.  EOMI. DTR symmetric (slightly decreased at the left ankle, possibly from MSK issues). Psych--  Cognition and judgment appear intact.  Cooperative with normal attention span and concentration.  Behavior appropriate. No anxious or depressed appearing.      Assessment     61 year old gentleman, PMH includes CAD, HTN, hyperlipidemia, GERD, history of a peptic ulcer, not anticoagulated, presents with  Vertigo: Suspect symptoms are peripheral in origin.  Recent labs including a CBC are essentially normal.  No red flags.  Orthostatic vital signs stable. Recommend fluids, Antivert, watch for drowsiness.  Call if not gradually better, he might benefit from vestibular rehabilitation. If severe symptoms: ER. Patient verbalized understanding

## 2022-08-19 NOTE — Patient Instructions (Signed)
Drink plenty of water  You can a medication called Antivert to 3 times a day to help you with dizziness. The medication may cause drowsiness.  Be careful.  If you have severe symptoms, chest pain, double vision, major headache, strokelike problems: Go to the ER  If you are not gradually better please reach out to this office

## 2022-09-03 DIAGNOSIS — G4733 Obstructive sleep apnea (adult) (pediatric): Secondary | ICD-10-CM | POA: Diagnosis not present

## 2022-09-06 DIAGNOSIS — L814 Other melanin hyperpigmentation: Secondary | ICD-10-CM | POA: Diagnosis not present

## 2022-09-06 DIAGNOSIS — L820 Inflamed seborrheic keratosis: Secondary | ICD-10-CM | POA: Diagnosis not present

## 2022-09-06 DIAGNOSIS — L821 Other seborrheic keratosis: Secondary | ICD-10-CM | POA: Diagnosis not present

## 2022-09-06 DIAGNOSIS — Z789 Other specified health status: Secondary | ICD-10-CM | POA: Diagnosis not present

## 2022-09-06 DIAGNOSIS — L538 Other specified erythematous conditions: Secondary | ICD-10-CM | POA: Diagnosis not present

## 2022-09-06 DIAGNOSIS — L298 Other pruritus: Secondary | ICD-10-CM | POA: Diagnosis not present

## 2022-09-06 DIAGNOSIS — D1801 Hemangioma of skin and subcutaneous tissue: Secondary | ICD-10-CM | POA: Diagnosis not present

## 2022-09-06 DIAGNOSIS — R208 Other disturbances of skin sensation: Secondary | ICD-10-CM | POA: Diagnosis not present

## 2022-09-16 DIAGNOSIS — G4733 Obstructive sleep apnea (adult) (pediatric): Secondary | ICD-10-CM | POA: Diagnosis not present

## 2022-09-20 ENCOUNTER — Other Ambulatory Visit: Payer: Self-pay

## 2022-09-20 MED ORDER — METFORMIN HCL ER 500 MG PO TB24: 500.0000 mg | ORAL_TABLET | Freq: Every day | ORAL | 0 refills | Status: DC

## 2022-10-03 DIAGNOSIS — G4733 Obstructive sleep apnea (adult) (pediatric): Secondary | ICD-10-CM | POA: Diagnosis not present

## 2022-10-16 DIAGNOSIS — G4733 Obstructive sleep apnea (adult) (pediatric): Secondary | ICD-10-CM | POA: Diagnosis not present

## 2022-11-02 ENCOUNTER — Encounter (HOSPITAL_BASED_OUTPATIENT_CLINIC_OR_DEPARTMENT_OTHER): Payer: Self-pay | Admitting: Emergency Medicine

## 2022-11-02 ENCOUNTER — Emergency Department (HOSPITAL_BASED_OUTPATIENT_CLINIC_OR_DEPARTMENT_OTHER): Payer: Medicare HMO

## 2022-11-02 ENCOUNTER — Emergency Department (HOSPITAL_BASED_OUTPATIENT_CLINIC_OR_DEPARTMENT_OTHER)
Admission: EM | Admit: 2022-11-02 | Discharge: 2022-11-02 | Disposition: A | Discharge status: Home / Self Care | Payer: Medicare HMO | Attending: Emergency Medicine | Admitting: Emergency Medicine

## 2022-11-02 ENCOUNTER — Other Ambulatory Visit: Payer: Self-pay

## 2022-11-02 ENCOUNTER — Telehealth: Payer: Self-pay | Admitting: Family Medicine

## 2022-11-02 DIAGNOSIS — Z7984 Long term (current) use of oral hypoglycemic drugs: Secondary | ICD-10-CM | POA: Insufficient documentation

## 2022-11-02 DIAGNOSIS — I2510 Atherosclerotic heart disease of native coronary artery without angina pectoris: Secondary | ICD-10-CM | POA: Insufficient documentation

## 2022-11-02 DIAGNOSIS — I10 Essential (primary) hypertension: Secondary | ICD-10-CM | POA: Diagnosis not present

## 2022-11-02 DIAGNOSIS — E1165 Type 2 diabetes mellitus with hyperglycemia: Secondary | ICD-10-CM | POA: Insufficient documentation

## 2022-11-02 DIAGNOSIS — M7989 Other specified soft tissue disorders: Secondary | ICD-10-CM | POA: Diagnosis not present

## 2022-11-02 DIAGNOSIS — L97411 Non-pressure chronic ulcer of right heel and midfoot limited to breakdown of skin: Secondary | ICD-10-CM | POA: Insufficient documentation

## 2022-11-02 DIAGNOSIS — M79671 Pain in right foot: Secondary | ICD-10-CM | POA: Diagnosis not present

## 2022-11-02 DIAGNOSIS — Z79899 Other long term (current) drug therapy: Secondary | ICD-10-CM | POA: Diagnosis not present

## 2022-11-02 DIAGNOSIS — E11621 Type 2 diabetes mellitus with foot ulcer: Secondary | ICD-10-CM | POA: Insufficient documentation

## 2022-11-02 DIAGNOSIS — Z7982 Long term (current) use of aspirin: Secondary | ICD-10-CM | POA: Diagnosis not present

## 2022-11-02 DIAGNOSIS — L97519 Non-pressure chronic ulcer of other part of right foot with unspecified severity: Secondary | ICD-10-CM | POA: Diagnosis not present

## 2022-11-02 LAB — CBG MONITORING, ED: Glucose-Capillary: 118 mg/dL — ABNORMAL HIGH (ref 70–99)

## 2022-11-02 LAB — CBC WITH DIFFERENTIAL/PLATELET
Abs Immature Granulocytes: 0.02 10*3/uL (ref 0.00–0.07)
Basophils Absolute: 0 10*3/uL (ref 0.0–0.1)
Basophils Relative: 1 %
Eosinophils Absolute: 0.1 10*3/uL (ref 0.0–0.5)
Eosinophils Relative: 1 %
HCT: 43.6 % (ref 39.0–52.0)
Hemoglobin: 14.5 g/dL (ref 13.0–17.0)
Immature Granulocytes: 0 %
Lymphocytes Relative: 24 %
Lymphs Abs: 1.5 10*3/uL (ref 0.7–4.0)
MCH: 31.5 pg (ref 26.0–34.0)
MCHC: 33.3 g/dL (ref 30.0–36.0)
MCV: 94.8 fL (ref 80.0–100.0)
Monocytes Absolute: 0.5 10*3/uL (ref 0.1–1.0)
Monocytes Relative: 8 %
Neutro Abs: 4 10*3/uL (ref 1.7–7.7)
Neutrophils Relative %: 66 %
Platelets: 166 10*3/uL (ref 150–400)
RBC: 4.6 MIL/uL (ref 4.22–5.81)
RDW: 13.2 % (ref 11.5–15.5)
WBC: 6.1 10*3/uL (ref 4.0–10.5)
nRBC: 0 % (ref 0.0–0.2)

## 2022-11-02 LAB — COMPREHENSIVE METABOLIC PANEL
ALT: 41 U/L (ref 0–44)
AST: 37 U/L (ref 15–41)
Albumin: 3.9 g/dL (ref 3.5–5.0)
Alkaline Phosphatase: 45 U/L (ref 38–126)
Anion gap: 7 (ref 5–15)
BUN: 12 mg/dL (ref 8–23)
CO2: 28 mmol/L (ref 22–32)
Calcium: 9.3 mg/dL (ref 8.9–10.3)
Chloride: 102 mmol/L (ref 98–111)
Creatinine, Ser: 0.86 mg/dL (ref 0.61–1.24)
GFR, Estimated: >60 mL/min (ref >60)
Glucose, Bld: 125 mg/dL — ABNORMAL HIGH (ref 70–99)
Potassium: 4.4 mmol/L (ref 3.5–5.1)
Sodium: 137 mmol/L (ref 135–145)
Total Bilirubin: 0.5 mg/dL (ref 0.3–1.2)
Total Protein: 7.8 g/dL (ref 6.5–8.1)

## 2022-11-02 LAB — C-REACTIVE PROTEIN: CRP: 0.6 mg/dL (ref <1.0)

## 2022-11-02 LAB — LACTIC ACID, PLASMA: Lactic Acid, Venous: 1.5 mmol/L (ref 0.5–1.9)

## 2022-11-02 LAB — SEDIMENTATION RATE: Sed Rate: 8 mm/hr (ref 0–16)

## 2022-11-02 MED ORDER — DOXYCYCLINE HYCLATE 100 MG PO CAPS: 100.0000 mg | ORAL_CAPSULE | Freq: Two times a day (BID) | ORAL | 0 refills | Status: AC

## 2022-11-02 MED ORDER — DOXYCYCLINE HYCLATE 100 MG PO CAPS: 100.0000 mg | ORAL_CAPSULE | Freq: Two times a day (BID) | ORAL | 0 refills | Status: DC

## 2022-11-02 NOTE — Discharge Instructions (Signed)
You were seen in the emergency department today for a diabetic foot ulcer on your right foot.  I am placing on doxycycline twice a day over the next 10 days.  Additionally have referred you to podiatry.  Please call in 2 days to the podiatrist if you have not heard from them to schedule an appointment.  I placed her information in your discharge paperwork.  Please return to emergency department if you have worsening swelling or redness that is moving up your foot or ankle or begin to have systemic symptoms of infection such as fever, nausea and vomiting, chills.

## 2022-11-02 NOTE — Telephone Encounter (Addendum)
Spoke with patient, triage line not available.  Patient reports red streaks on right foot with edema in first 2 toes. States egg size ball on the bottom of foot. Symptoms started 4 days ago.  Patient reports pain of "10+" on the pain scale and unable to ambulate d/t pain.   Advised to go to [facility] for immediate pain relief and treatment. Patient verbalized understanding.

## 2022-11-02 NOTE — Telephone Encounter (Signed)
Noted  

## 2022-11-02 NOTE — ED Triage Notes (Signed)
Patient presents C/O R foot pain/swelling X4 days. Wound present to bottom of R foot. Redness and swelling present. Hx of diabetes.

## 2022-11-02 NOTE — Telephone Encounter (Signed)
Pt called stating that his foot s swollen and looks infected. Pt stated he has a considerable amount of pain in it and can barely walk. Pt was transferred to triage nurse for further eval.

## 2022-11-02 NOTE — ED Provider Notes (Signed)
[facility] EMERGENCY DEPARTMENT Provider Note   CSN: [id number] Arrival date & time: 11/02/22  1202     History  Chief Complaint  Patient presents with   Foot Pain    Michael Pearson is a 62 y.o. male.  With past medical history of CAD, NSTEMI, GERD, hypertension, type 2 diabetes who presents to the emergency department with foot pain.  Patient states that over the past 4 days he has had right-sided foot pain that he describes as walking on a piece of glass.  He states that this morning he saw some red streaking on the side of his foot and was concerned and so presented to the emergency department.  He is also noticed some swelling to the right great toe and second digit and thinks he saw some purulent drainage from the right great toe but is unsure.  He states that he has never had a foot wound before.  He does not follow with podiatry.  He denies any systemic symptoms like fever, chills, nausea.  Foot Pain       Home Medications Prior to Admission medications   Medication Sig Start Date End Date Taking? Authorizing Provider  doxycycline (VIBRAMYCIN) 100 MG capsule Take 1 capsule (100 mg total) by mouth 2 (two) times daily for 10 days. 11/02/22 11/12/22 Yes Mickie Hillier, PA-C  albuterol (PROVENTIL HFA;VENTOLIN HFA) 108 (90 Base) MCG/ACT inhaler Inhale 2 puffs into the lungs every 4 (four) hours as needed for wheezing or shortness of breath. 11/24/17   Carollee Herter, Alferd Apa, DO  aspirin EC 81 MG tablet Take 81 mg by mouth in the morning. Swallow whole.    [provider]  atorvastatin (LIPITOR) 40 MG tablet Take 1 tablet (40 mg total) by mouth daily. 12/25/21   [name], DO  cabergoline (DOSTINEX) 0.5 MG tablet Take 1 tablet (0.5 mg total) by mouth 2 (two) times a week. 01/28/22   Renato Shin, MD  cetirizine (ZYRTEC) 10 MG tablet Take 10 mg by mouth daily as needed for allergies.     [provider]  diclofenac Sodium (VOLTAREN) 1 % GEL Apply 4 g  topically 4 (four) times daily. 06/25/22   [name], DO  docusate sodium (COLACE) 100 MG capsule Take 100 mg by mouth 2 (two) times daily as needed (constipation.).    [provider]  esomeprazole (NEXIUM) 40 MG capsule Take 1 capsule (40 mg total) by mouth 2 (two) times daily before a meal. 06/25/22   Carollee Herter, Alferd Apa, DO  ezetimibe (ZETIA) 10 MG tablet Take 1 tablet (10 mg total) by mouth daily. 12/25/21   Carollee Herter, Yvonne R, DO  fluticasone furoate-vilanterol (BREO ELLIPTA) 200-25 MCG/INH AEPB Inhale 1 puff into the lungs daily. Patient taking differently: Inhale 1 puff into the lungs daily as needed (respiratory issues.). 02/25/21   Mannam, Hart Robinsons, MD  losartan (COZAAR) 50 MG tablet Take 1 tablet (50 mg total) by mouth daily. 12/25/21   [name], DO  meclizine (ANTIVERT) 25 MG tablet Take 1 tablet (25 mg total) by mouth 3 (three) times daily as needed for dizziness. 08/19/22   Colon Branch, MD  metFORMIN (GLUCOPHAGE-XR) 500 MG 24 hr tablet Take 1 tablet (500 mg total) by mouth daily. 09/20/22   Philemon Kingdom, MD  Multiple Vitamin (MULTIVITAMIN WITH MINERALS) TABS tablet Take 1 tablet by mouth in the morning.    [provider]  Semaglutide (RYBELSUS) 3 MG TABS  Take 3 mg by mouth daily. 01/27/22   Ellison, Sean, MD  triamcinolone cream (KENALOG) 0.1 % Apply 1 application topically 2 (two) times daily. Patient taking differently: Apply 1 application  topically 2 (two) times daily as needed (skin irritation/skin folds). 05/29/19   Lowne Chase, Yvonne R, DO      Allergies    Patient has no known allergies.    Review of Systems   Review of Systems  Skin:  Positive for wound.  All other systems reviewed and are negative.   Physical Exam Updated Vital Signs BP (!) 141/74 (BP Location: Left Arm)   Pulse 80   Temp 98.5 F (36.9 C) (Oral)   Resp 18   Ht 6' 2" (1.88 m)   Wt 127 kg   SpO2 96%   BMI 35.95 kg/m  Physical Exam Vitals and  nursing note reviewed.  Constitutional:      General: He is not in acute distress.    Appearance: Normal appearance. He is not ill-appearing or toxic-appearing.  HENT:     Head: Normocephalic and atraumatic.  Eyes:     General: No scleral icterus. Cardiovascular:     Rate and Rhythm: Normal rate and regular rhythm.     Pulses: Normal pulses.          Dorsalis pedis pulses are 2+ on the right side.     Heart sounds: No murmur heard. Pulmonary:     Effort: Pulmonary effort is normal. No respiratory distress.     Breath sounds: Normal breath sounds.  Abdominal:     Palpations: Abdomen is soft.  Musculoskeletal:        General: Swelling and tenderness present. Normal range of motion.     Cervical back: Neck supple.       Feet:  Feet:     Right foot:     Skin integrity: Ulcer, skin breakdown, erythema and warmth present.     Comments: About 2cm wound to the plantar surface of the right first metatarsal. Erythema and swelling to the right great toe, second toe. TTP. DP 2+. Sensation intact. No obviously lymphangitic spread.  Skin:    General: Skin is warm and dry.     Capillary Refill: Capillary refill takes less than 2 seconds.     Findings: No rash.  Neurological:     General: No focal deficit present.     Mental Status: He is alert and oriented to person, place, and time. Mental status is at baseline.  Psychiatric:        Mood and Affect: Mood normal.        Behavior: Behavior normal.        Thought Content: Thought content normal.        Judgment: Judgment normal.     ED Results / Procedures / Treatments   Labs (all labs ordered are listed, but only abnormal results are displayed) Labs Reviewed  COMPREHENSIVE METABOLIC PANEL - Abnormal; Notable for the following components:      Result Value   Glucose, Bld 125 (*)    All other components within normal limits  CBG MONITORING, ED - Abnormal; Notable for the following components:   Glucose-Capillary 118 (*)    All other  components within normal limits  CBC WITH DIFFERENTIAL/PLATELET  SEDIMENTATION RATE  LACTIC ACID, PLASMA  C-REACTIVE PROTEIN  LACTIC ACID, PLASMA   EKG None  Radiology DG Foot Complete Right  Result Date: 11/02/2022 CLINICAL DATA:  Right foot pain and swelling for   the past 4 days. Plantar foot wound. EXAM: RIGHT FOOT COMPLETE - 3+ VIEW COMPARISON:  Right foot x-rays dated Mar 05, 2020. FINDINGS: Small superficial plantar ulceration at the level of the metatarsal heads. No bony destruction or periosteal reaction. No acute fracture or dislocation. Joint spaces are preserved. Bone mineralization is normal. IMPRESSION: 1. Small superficial plantar ulceration at the level of the metatarsal heads. No radiographic evidence of osteomyelitis. Electronically Signed   By: William T Derry M.D.   On: 11/02/2022 15:31    Procedures Procedures   Medications Ordered in ED Medications - No data to display  ED Course/ Medical Decision Making/ A&P                           Medical Decision Making Amount and/or Complexity of Data Reviewed Labs: ordered. Radiology: ordered.  Initial Impression and Ddx 61-year-old male who presents to the emergency department with right foot wound.  He has what appears to be a diabetic foot ulcer to the plantar surface of the right great toe.  There is underlying swelling and erythema and warmth.  The right great toe and second digit are both swollen.  Do not appreciate any purulent drainage at this time.  Do not appreciate any lymphangitic spreading.  Will obtain a plain film of the foot to evaluate for osteo-.  Also obtaining labs including ESR, CRP and lactic. Patient PMH that increases complexity of ED encounter: NSTEMI, CAD, GERD, hypertension, type 2 diabetes  Interpretation of Diagnostics I independent reviewed and interpreted the labs as followed: CMP without electrolyte derangement, CBC without leukocytosis, lactic negative, sed rate send normal limits, CRP is  pending.  - I independently visualized the following imaging with scope of interpretation limited to determining acute life threatening conditions related to emergency care: Plain film of the right foot, which revealed no evidence of osteomyelitis  Patient Reassessment and Ultimate Disposition/Management Right foot exam as noted above. There is no evidence of lymphangitic spreading or overlying cellulitis of the foot or ankle.  No systemic symptoms.  His labs are all within normal limits. The plain film of his right foot does not show any evidence of osteomyelitis. Neurovascularly intact Do not feel that he requires further workup at this time or IV antibiotics.  Will discharge him on doxycycline given that there is some purulence to the bottom of the wound.  Given strict return precautions for worsening symptoms or systemic symptoms.  Will refer him to podiatry.  The patient has been appropriately medically screened and/or stabilized in the ED. I have low suspicion for any other emergent medical condition which would require further screening, evaluation or treatment in the ED or require inpatient management. At time of discharge the patient is hemodynamically stable and in no acute distress. I have discussed work-up results and diagnosis with patient and answered all questions. Patient is agreeable with discharge plan. We discussed strict return precautions for returning to the emergency department and they verbalized understanding.    Patient management required discussion with the following services or consulting groups:  None  Complexity of Problems Addressed Acute complicated illness or Injury  Additional Data Reviewed and Analyzed Further history obtained from: Further history from spouse/family member, Past medical history and medications listed in the EMR, Recent PCP notes, Care Everywhere, and Prior labs/imaging results  Patient Encounter Risk Assessment Prescriptions, SDOH impact on  management, and Consideration of hospitalization  Final Clinical Impression(s) / ED Diagnoses Final diagnoses:    Diabetic ulcer of other part of right foot associated with type 2 diabetes mellitus, limited to breakdown of skin (HCC)    Rx / DC Orders ED Discharge Orders          Ordered    Ambulatory referral to Podiatry        11/02/22 1734    doxycycline (VIBRAMYCIN) 100 MG capsule  2 times daily        11/02/22 1734              ,  E, PA-C 11/02/22 1734    Naasz, Hayley N, MD 11/02/22 2034  

## 2022-11-05 ENCOUNTER — Ambulatory Visit: Payer: Medicare HMO | Admitting: Podiatry

## 2022-11-05 VITALS — BP 136/74

## 2022-11-05 DIAGNOSIS — L97511 Non-pressure chronic ulcer of other part of right foot limited to breakdown of skin: Secondary | ICD-10-CM | POA: Diagnosis not present

## 2022-11-05 NOTE — Progress Notes (Signed)
Subjective:  Patient ID: Michael Pearson, male    DOB: 24-Jul-1961,  MRN: [id number]  No chief complaint on file.   62 y.o. male presents with the above complaint.  Patient presents with right submetatarsal 1 superficial ulceration.  Patient states it came and nowhere has been causing some pain he denies any other acute complaints he has not seen anyone as prior to seeing me.  Hurts with ambulation worse with pressure.   Review of Systems: Negative except as noted in the HPI. Denies N/V/F/Ch.  Past Medical History:  Diagnosis Date   Allergy    Arthritis    Asthma    CAD (coronary artery disease)    NSTEMI 7/12:  Cardiac cath on 7/17: pLAD occluded, Dx 30-40%, pRCA 30-40%, mRCA 30-40%.  Proximal LAD was treated with a BMS.  Echo 7/19:  EF 60-65%, mild LVH.    ETT-Myoview 6/14:  Inf thinning, no ischemia, EF 64%, normal study // Myoview 04/2020: EF 57, normal perfusion; Low Risk     Cognitive communication deficit    Colitis    Diverticulosis    Duodenitis    May 2012 with heme positive stools at this time. Endorses only rare streaking of blood now.   External hemorrhoids    GERD (gastroesophageal reflux disease)    Heart murmur    as a child per the pt   Hiatal hernia    Hx of hemorrhoids    Hyperlipidemia    Hypertension    Kidney stones    Microadenoma    MRI in 2008 demonstrating 4.6 mm area of pituitary gland    Migraines    Has been evaluated multiple times in past for chronic headache in which pt has had occasional nose bleeds and bloodshot eyes. This lasted for 4-5 months. CT of the head was negative in May 2012.   Myocardial infarction ([place]) 05/17/2011   Pituitary tumor    RBBB (right bundle branch block)    Noted on EKG in 2008   Ulcer     Current Outpatient Medications:    albuterol (PROVENTIL HFA;VENTOLIN HFA) 108 (90 Base) MCG/ACT inhaler, Inhale 2 puffs into the lungs every 4 (four) hours as needed for wheezing or shortness of breath., Disp: 1 Inhaler, Rfl: 2    aspirin EC 81 MG tablet, Take 81 mg by mouth in the morning. Swallow whole., Disp: , Rfl:    atorvastatin (LIPITOR) 40 MG tablet, Take 1 tablet (40 mg total) by mouth daily., Disp: 90 tablet, Rfl: 1   [START ON 11/11/2022] cabergoline (DOSTINEX) 0.5 MG tablet, Take 1 tablet (0.5 mg total) by mouth 2 (two) times a week., Disp: 25 tablet, Rfl: 0   cetirizine (ZYRTEC) 10 MG tablet, Take 10 mg by mouth daily as needed for allergies. , Disp: , Rfl:    diclofenac Sodium (VOLTAREN) 1 % GEL, Apply 4 g topically 4 (four) times daily., Disp: 100 g, Rfl: 2   docusate sodium (COLACE) 100 MG capsule, Take 100 mg by mouth 2 (two) times daily as needed (constipation.)., Disp: , Rfl:    doxycycline (VIBRAMYCIN) 100 MG capsule, Take 1 capsule (100 mg total) by mouth 2 (two) times daily for 10 days., Disp: 20 capsule, Rfl: 0   esomeprazole (NEXIUM) 40 MG capsule, Take 1 capsule (40 mg total) by mouth 2 (two) times daily before a meal., Disp: 180 capsule, Rfl: 3   ezetimibe (ZETIA) 10 MG tablet, Take 1 tablet (10 mg total) by mouth daily., Disp: 90 tablet, Rfl:  3   fluticasone furoate-vilanterol (BREO ELLIPTA) 200-25 MCG/INH AEPB, Inhale 1 puff into the lungs daily. (Patient taking differently: Inhale 1 puff into the lungs daily as needed (respiratory issues.).), Disp: 60 each, Rfl: 2   losartan (COZAAR) 50 MG tablet, Take 1 tablet (50 mg total) by mouth daily., Disp: 90 tablet, Rfl: 3   meclizine (ANTIVERT) 25 MG tablet, Take 1 tablet (25 mg total) by mouth 3 (three) times daily as needed for dizziness., Disp: 21 tablet, Rfl: 0   metFORMIN (GLUCOPHAGE-XR) 500 MG 24 hr tablet, Take 1 tablet (500 mg total) by mouth daily., Disp: 90 tablet, Rfl: 0   methocarbamol (ROBAXIN) 500 MG tablet, Take 1 tablet (500 mg total) by mouth every 8 (eight) hours as needed for muscle spasms., Disp: 60 tablet, Rfl: 0   Multiple Vitamin (MULTIVITAMIN WITH MINERALS) TABS tablet, Take 1 tablet by mouth in the morning., Disp: , Rfl:     Semaglutide (RYBELSUS) 3 MG TABS, Take 3 mg by mouth daily., Disp: 30 tablet, Rfl: 11   triamcinolone cream (KENALOG) 0.1 %, Apply 1 application topically 2 (two) times daily. (Patient taking differently: Apply 1 application  topically 2 (two) times daily as needed (skin irritation/skin folds).), Disp: 30 g, Rfl: 2  Social History   Tobacco Use  Smoking Status Never  Smokeless Tobacco Never    No Known Allergies Objective:   Vitals:   11/05/22 1006  BP: 136/74   There is no height or weight on file to calculate BMI. Constitutional Well developed. Well nourished.  Vascular Dorsalis pedis pulses palpable bilaterally. Posterior tibial pulses palpable bilaterally. Capillary refill normal to all digits.  No cyanosis or clubbing noted. Pedal hair growth normal.  Neurologic Normal speech. Oriented to person, place, and time. Epicritic sensation to light touch grossly present bilaterally.  Dermatologic Right submetatarsal 1 ulceration noted limited to the breakdown of skin granular wound bed no malodor present note does not probe down to bone.  No erythema noted  Orthopedic: Normal joint ROM without pain or crepitus bilaterally. No visible deformities. No bony tenderness.   Radiographs: None Assessment:   1. Right foot ulcer, limited to breakdown of skin ([place])    Plan:  Patient was evaluated and treated and all questions answered.  Right submetatarsal 1 ulceration limited to the breakdown of the skin -All questions and concerns were discussed with the patient in extensive detail. -Given the amount of year well ulceration is present patient will benefit from Betadine wet-to-dry dressing and aggressive offloading.  He already has surgical shoe at home and will continue wearing that. -Betadine wet-to-dry dressing changes daily   No follow-ups on file.  Right submet 1 ulcer minimal debridment. Betadine wet to dry.

## 2022-11-08 ENCOUNTER — Telehealth: Payer: Self-pay | Admitting: Family Medicine

## 2022-11-08 ENCOUNTER — Other Ambulatory Visit: Payer: Self-pay | Admitting: Family Medicine

## 2022-11-08 MED ORDER — METHOCARBAMOL 500 MG PO TABS: 500.0000 mg | ORAL_TABLET | Freq: Three times a day (TID) | ORAL | 0 refills | Status: DC | PRN

## 2022-11-08 NOTE — Telephone Encounter (Signed)
Pt stated he needed this to go to the [facility] of [facility]. He is needing all three rxs filled. He has been out since Friday.    methocarbamol (ROBAXIN) 500 MG tablet [[id number]]  DISCONTINUED    metFORMIN (GLUCOPHAGE-XR) 500 MG 24 hr tablet [[id number]]    cabergoline (DOSTINEX) 0.5 MG tablet [[id number]]    [facility] [address] [place], [place] [address] P: [phone]

## 2022-11-08 NOTE — Telephone Encounter (Signed)
Pt requesting refill on Methocarbamol. Med has been discontinued. Please advise

## 2022-11-08 NOTE — Telephone Encounter (Signed)
Prescription Request  11/08/2022  Is this a "Controlled Substance" medicine? No  LOV: 06/25/2022  What is the name of the medication or equipment?   methocarbamol (ROBAXIN) 500 MG tablet [[id number]]  DISCONTINUED   metFORMIN (GLUCOPHAGE-XR) 500 MG 24 hr tablet [[id number]]   cabergoline (DOSTINEX) 0.5 MG tablet [[id number]]   Have you contacted your pharmacy to request a refill? No   Which pharmacy would you like this sent to?   [facility], [place], [place] [address] P: [phone]  Patient notified that their request is being sent to the clinical staff for review and that they should receive a response within 2 business days.   Please advise at Mobile [phone] (mobile)

## 2022-11-09 ENCOUNTER — Telehealth: Payer: Self-pay

## 2022-11-09 MED ORDER — METHOCARBAMOL 500 MG PO TABS: 500.0000 mg | ORAL_TABLET | Freq: Three times a day (TID) | ORAL | 0 refills | Status: DC | PRN

## 2022-11-09 MED ORDER — CABERGOLINE 0.5 MG PO TABS: 0.5000 mg | ORAL_TABLET | ORAL | 0 refills | Status: DC

## 2022-11-09 MED ORDER — METFORMIN HCL ER 500 MG PO TB24: 500.0000 mg | ORAL_TABLET | Freq: Every day | ORAL | 0 refills | Status: DC

## 2022-11-09 NOTE — Telephone Encounter (Signed)
Refills sent. Pt made aware via Juanda Crumble. When pt's son called back

## 2022-11-09 NOTE — Telephone Encounter (Signed)
Patient's son calling to advise he has went up there 3 times to pick up prescriptions but they are not ready. He has called [place] multiple times and the medication has not been sent to correct pharmacy. [facility] is the wrong pharmacy. They should be sent to [facility]. Advised the message was sent high priority to CMA yesterday afternoon and to please allow a little more time for the CMA to address. Patient's son said he "will give her 10 minutes to call him back as he feels he can never get anyone on the phone and it doesn't seem like this is a high priority" Please send medications to [facility] on [address] and remove [place] off his record and call him to advise they are sent in.

## 2022-11-09 NOTE — Telephone Encounter (Signed)
PA initiated via Covermymeds; KEY: [id number]. Awaiting determination

## 2022-11-10 NOTE — Telephone Encounter (Signed)
PA denied.   Preferred alternatives:  Cyclobenzaprine '5mg'$  and '10mg'$ (requires PA for Pt's 65 years and older)  Tizanidine tablets/capsules Chlorzoxazone '500mg'$  (requires PA) Baclofen

## 2022-11-11 MED ORDER — TIZANIDINE HCL 4 MG PO TABS: 4.0000 mg | ORAL_TABLET | Freq: Three times a day (TID) | ORAL | 0 refills | Status: DC | PRN

## 2022-11-11 NOTE — Telephone Encounter (Signed)
Per PCP- change to tizanidine '4mg'$  1 tab tid prn, #40. Rx sent.

## 2022-11-16 DIAGNOSIS — G4733 Obstructive sleep apnea (adult) (pediatric): Secondary | ICD-10-CM | POA: Diagnosis not present

## 2022-11-25 ENCOUNTER — Ambulatory Visit (INDEPENDENT_AMBULATORY_CARE_PROVIDER_SITE_OTHER): Payer: Medicare HMO | Admitting: Podiatry

## 2022-11-25 VITALS — BP 138/74

## 2022-11-25 DIAGNOSIS — L97511 Non-pressure chronic ulcer of other part of right foot limited to breakdown of skin: Secondary | ICD-10-CM

## 2022-11-25 NOTE — Progress Notes (Signed)
Subjective:  Patient ID: Michael Pearson, male    DOB: 1961-08-15,  MRN: [id number]  Chief Complaint  Patient presents with   Foot Ulcer    Right foot ulcer     62 y.o. male presents with the above complaint.  Patient presents with right submetatarsal 1 superficial ulceration follow-up.  He states minimal Betadine wet-to-dry dressing and looks a lot better feels a lot better denies any other acute complaints.   Review of Systems: Negative except as noted in the HPI. Denies N/V/F/Ch.  Past Medical History:  Diagnosis Date   Allergy    Arthritis    Asthma    CAD (coronary artery disease)    NSTEMI 7/12:  Cardiac cath on 7/17: pLAD occluded, Dx 30-40%, pRCA 30-40%, mRCA 30-40%.  Proximal LAD was treated with a BMS.  Echo 7/19:  EF 60-65%, mild LVH.    ETT-Myoview 6/14:  Inf thinning, no ischemia, EF 64%, normal study // Myoview 04/2020: EF 57, normal perfusion; Low Risk     Cognitive communication deficit    Colitis    Diverticulosis    Duodenitis    May 2012 with heme positive stools at this time. Endorses only rare streaking of blood now.   External hemorrhoids    GERD (gastroesophageal reflux disease)    Heart murmur    as a child per the pt   Hiatal hernia    Hx of hemorrhoids    Hyperlipidemia    Hypertension    Kidney stones    Microadenoma    MRI in 2008 demonstrating 4.6 mm area of pituitary gland    Migraines    Has been evaluated multiple times in past for chronic headache in which pt has had occasional nose bleeds and bloodshot eyes. This lasted for 4-5 months. CT of the head was negative in May 2012.   Myocardial infarction ([place]) 05/17/2011   Pituitary tumor    RBBB (right bundle branch block)    Noted on EKG in 2008   Ulcer     Current Outpatient Medications:    albuterol (PROVENTIL HFA;VENTOLIN HFA) 108 (90 Base) MCG/ACT inhaler, Inhale 2 puffs into the lungs every 4 (four) hours as needed for wheezing or shortness of breath., Disp: 1 Inhaler, Rfl: 2   aspirin EC  81 MG tablet, Take 81 mg by mouth in the morning. Swallow whole., Disp: , Rfl:    atorvastatin (LIPITOR) 40 MG tablet, Take 1 tablet (40 mg total) by mouth daily., Disp: 90 tablet, Rfl: 1   cabergoline (DOSTINEX) 0.5 MG tablet, Take 1 tablet (0.5 mg total) by mouth 2 (two) times a week., Disp: 25 tablet, Rfl: 0   cetirizine (ZYRTEC) 10 MG tablet, Take 10 mg by mouth daily as needed for allergies. , Disp: , Rfl:    diclofenac Sodium (VOLTAREN) 1 % GEL, Apply 4 g topically 4 (four) times daily., Disp: 100 g, Rfl: 2   docusate sodium (COLACE) 100 MG capsule, Take 100 mg by mouth 2 (two) times daily as needed (constipation.)., Disp: , Rfl:    esomeprazole (NEXIUM) 40 MG capsule, Take 1 capsule (40 mg total) by mouth 2 (two) times daily before a meal., Disp: 180 capsule, Rfl: 3   ezetimibe (ZETIA) 10 MG tablet, Take 1 tablet (10 mg total) by mouth daily., Disp: 90 tablet, Rfl: 3   fluticasone furoate-vilanterol (BREO ELLIPTA) 200-25 MCG/INH AEPB, Inhale 1 puff into the lungs daily. (Patient taking differently: Inhale 1 puff into the lungs daily as needed (respiratory  issues.).), Disp: 60 each, Rfl: 2   losartan (COZAAR) 50 MG tablet, Take 1 tablet (50 mg total) by mouth daily., Disp: 90 tablet, Rfl: 3   meclizine (ANTIVERT) 25 MG tablet, Take 1 tablet (25 mg total) by mouth 3 (three) times daily as needed for dizziness., Disp: 21 tablet, Rfl: 0   metFORMIN (GLUCOPHAGE-XR) 500 MG 24 hr tablet, Take 1 tablet (500 mg total) by mouth daily., Disp: 90 tablet, Rfl: 0   Multiple Vitamin (MULTIVITAMIN WITH MINERALS) TABS tablet, Take 1 tablet by mouth in the morning., Disp: , Rfl:    Semaglutide (RYBELSUS) 3 MG TABS, Take 3 mg by mouth daily., Disp: 30 tablet, Rfl: 11   tiZANidine (ZANAFLEX) 4 MG tablet, Take 1 tablet (4 mg total) by mouth 3 (three) times daily as needed for muscle spasms., Disp: 40 tablet, Rfl: 0   triamcinolone cream (KENALOG) 0.1 %, Apply 1 application topically 2 (two) times daily. (Patient  taking differently: Apply 1 application  topically 2 (two) times daily as needed (skin irritation/skin folds).), Disp: 30 g, Rfl: 2  Social History   Tobacco Use  Smoking Status Never  Smokeless Tobacco Never    No Known Allergies Objective:   Vitals:   11/25/22 0814  BP: 138/74   There is no height or weight on file to calculate BMI. Constitutional Well developed. Well nourished.  Vascular Dorsalis pedis pulses palpable bilaterally. Posterior tibial pulses palpable bilaterally. Capillary refill normal to all digits.  No cyanosis or clubbing noted. Pedal hair growth normal.  Neurologic Normal speech. Oriented to person, place, and time. Epicritic sensation to light touch grossly present bilaterally.  Dermatologic Right hallux submetatarsal 1 ulceration.  This has completely reepithelialized no further signs of wounds noted.  No signs of infection noted.  Orthopedic: Normal joint ROM without pain or crepitus bilaterally. No visible deformities. No bony tenderness.   Radiographs: None Assessment:   No diagnosis found.  Plan:  Patient was evaluated and treated and all questions answered.  Right submetatarsal 1 ulceration limited to the breakdown of the skin -All questions and concerns were discussed with the patient in extensive detail. -Clinically healed.  At this time patient states that he is doing much better no drainage the redness has improved considerably.  If any foot and ankle issues on future he will come back and see me.  I discussed with him modification.

## 2022-12-27 ENCOUNTER — Ambulatory Visit: Payer: Medicare HMO | Admitting: Family Medicine

## 2022-12-30 ENCOUNTER — Ambulatory Visit (INDEPENDENT_AMBULATORY_CARE_PROVIDER_SITE_OTHER): Payer: Medicare HMO | Admitting: Family Medicine

## 2022-12-30 ENCOUNTER — Encounter: Payer: Self-pay | Admitting: Family Medicine

## 2022-12-30 ENCOUNTER — Telehealth: Payer: Self-pay

## 2022-12-30 ENCOUNTER — Ambulatory Visit (HOSPITAL_BASED_OUTPATIENT_CLINIC_OR_DEPARTMENT_OTHER)
Admission: RE | Admit: 2022-12-30 | Discharge: 2022-12-30 | Disposition: A | Discharge status: Home / Self Care | Payer: Medicare HMO | Source: Ambulatory Visit | Attending: Family Medicine | Admitting: Family Medicine

## 2022-12-30 VITALS — BP 110/70 | HR 102 | Temp 98.2°F | Resp 20 | Ht 74.0 in

## 2022-12-30 DIAGNOSIS — R051 Acute cough: Secondary | ICD-10-CM | POA: Insufficient documentation

## 2022-12-30 DIAGNOSIS — R079 Chest pain, unspecified: Secondary | ICD-10-CM | POA: Diagnosis not present

## 2022-12-30 DIAGNOSIS — D352 Benign neoplasm of pituitary gland: Secondary | ICD-10-CM | POA: Diagnosis not present

## 2022-12-30 DIAGNOSIS — R6883 Chills (without fever): Secondary | ICD-10-CM | POA: Diagnosis not present

## 2022-12-30 DIAGNOSIS — I25119 Atherosclerotic heart disease of native coronary artery with unspecified angina pectoris: Secondary | ICD-10-CM | POA: Diagnosis not present

## 2022-12-30 DIAGNOSIS — K5010 Crohn's disease of large intestine without complications: Secondary | ICD-10-CM

## 2022-12-30 DIAGNOSIS — R82998 Other abnormal findings in urine: Secondary | ICD-10-CM

## 2022-12-30 DIAGNOSIS — D689 Coagulation defect, unspecified: Secondary | ICD-10-CM | POA: Diagnosis not present

## 2022-12-30 DIAGNOSIS — R059 Cough, unspecified: Secondary | ICD-10-CM | POA: Diagnosis not present

## 2022-12-30 DIAGNOSIS — E221 Hyperprolactinemia: Secondary | ICD-10-CM

## 2022-12-30 DIAGNOSIS — E785 Hyperlipidemia, unspecified: Secondary | ICD-10-CM

## 2022-12-30 DIAGNOSIS — E1169 Type 2 diabetes mellitus with other specified complication: Secondary | ICD-10-CM

## 2022-12-30 DIAGNOSIS — E1165 Type 2 diabetes mellitus with hyperglycemia: Secondary | ICD-10-CM

## 2022-12-30 LAB — POC URINALSYSI DIPSTICK (AUTOMATED)
Blood, UA: NEGATIVE
Glucose, UA: NEGATIVE
Nitrite, UA: NEGATIVE
Protein, UA: POSITIVE — AB
Spec Grav, UA: 1.01 (ref 1.010–1.025)
Urobilinogen, UA: 0.2 E.U./dL
pH, UA: 8 (ref 5.0–8.0)

## 2022-12-30 LAB — POC INFLUENZA A&B (BINAX/QUICKVUE)
Influenza A, POC: NEGATIVE
Influenza B, POC: NEGATIVE

## 2022-12-30 MED ORDER — PROMETHAZINE-DM 6.25-15 MG/5ML PO SYRP: 5.0000 mL | ORAL_SOLUTION | Freq: Four times a day (QID) | ORAL | 0 refills | Status: DC | PRN

## 2022-12-30 MED ORDER — AMOXICILLIN-POT CLAVULANATE 875-125 MG PO TABS: 1.0000 | ORAL_TABLET | Freq: Two times a day (BID) | ORAL | 0 refills | Status: DC

## 2022-12-30 NOTE — Telephone Encounter (Signed)
Printed Augmentin and Promethazine rx. Transmission failed via Epic. Rx faxed to pharmacy.

## 2022-12-30 NOTE — Patient Instructions (Signed)

## 2022-12-30 NOTE — Progress Notes (Signed)
Subjective:   By signing my name below, I, Shehryar Baig, attest that this documentation has been prepared under the direction and in the presence of [name], DO. 12/30/2022   Patient ID: Michael Pearson, male    DOB: [birth year], 62 y.o.   MRN: [id number]  Chief Complaint  Patient presents with   Dizziness    Pt states dizziness started late last night and stated having chills/shakes.     Dizziness Associated symptoms include chills and coughing (dry).   Patient is in today for a office visit.  He complains of freezing yesterday from 12 am to 2 am. He also felt freezing after waking up in the morning. He was so cold that he was shivering. He did not take his temperature. He also has racing heart beat, dizziness, difficulty breathing. He tried to use his nebulizer last night while feeling cold and was struggling getting the solution in.  He also complains of dry cough for the past week. He has burning while urinating as well.  He reports having a surgical procedure to have a lesion on his foot removed a couple of weeks ago in the emergency room. It is healing well at this time.    Past Medical History:  Diagnosis Date   Allergy    Arthritis    Asthma    CAD (coronary artery disease)    NSTEMI 7/12:  Cardiac cath on 7/17: pLAD occluded, Dx 30-40%, pRCA 30-40%, mRCA 30-40%.  Proximal LAD was treated with a BMS.  Echo 7/19:  EF 60-65%, mild LVH.    ETT-Myoview 6/14:  Inf thinning, no ischemia, EF 64%, normal study // Myoview 04/2020: EF 57, normal perfusion; Low Risk     Cognitive communication deficit    Colitis    Diverticulosis    Duodenitis    May 2012 with heme positive stools at this time. Endorses only rare streaking of blood now.   External hemorrhoids    GERD (gastroesophageal reflux disease)    Heart murmur    as a child per the pt   Hiatal hernia    Hx of hemorrhoids    Hyperlipidemia    Hypertension    Kidney stones    Microadenoma    MRI in 2008  demonstrating 4.6 mm area of pituitary gland    Migraines    Has been evaluated multiple times in past for chronic headache in which pt has had occasional nose bleeds and bloodshot eyes. This lasted for 4-5 months. CT of the head was negative in May 2012.   Myocardial infarction ([place]) 05/17/2011   Pituitary tumor    RBBB (right bundle branch block)    Noted on EKG in 2008   Ulcer     Past Surgical History:  Procedure Laterality Date   BIOPSY  10/15/2021   Procedure: BIOPSY;  Surgeon: Jerene Bears, MD;  Location: [facility];  Service: Gastroenterology;;   COLONOSCOPY     egd  03/05/2011   Dr. Benson [place]   ESOPHAGOGASTRODUODENOSCOPY (EGD) WITH PROPOFOL N/A 10/15/2021   Procedure: ESOPHAGOGASTRODUODENOSCOPY (EGD) WITH PROPOFOL;  Surgeon: Jerene Bears, MD;  Location: [facility];  Service: Gastroenterology;  Laterality: N/A;  possible dilation   FLEXIBLE SIGMOIDOSCOPY  03/05/2011   Dr. Benson Norway   HAMMER TOE SURGERY     Heart stent     HERNIA REPAIR     In 20's   MALONEY DILATION  10/15/2021   Procedure: MALONEY DILATION;  Surgeon: Jerene Bears, MD;  Location: [facility];  Service: Gastroenterology;;  52cm    ORIF TIBIA PLATEAU Left 08/04/2018   Procedure: OPEN REDUCTION INTERNAL FIXATION (ORIF) TIBIAL PLATEAU;  Surgeon: Altamese [place], MD;  Location: [facility];  Service: Orthopedics;  Laterality: Left;   SIGMOIDOSCOPY     TOOTH EXTRACTION     UPPER GASTROINTESTINAL ENDOSCOPY      Family History  Problem Relation Age of Onset   Stroke Mother    Heart attack Father 32       MI   Skin cancer Father    Cancer Father        ? type "rare"   Obesity Brother    Coronary artery disease Brother        Possible, pt not sure of specifics   Hypertension Brother    Colon polyps Maternal Aunt    Colon cancer Maternal Aunt    Colon polyps Paternal Grandmother    Other Neg Hx    Esophageal cancer Neg Hx    Rectal cancer Neg Hx    Stomach cancer Neg Hx     Social History   Socioeconomic  History   Marital status: Single    Spouse name: Not on file   Number of children: Not on file   Years of education: Not on file   Highest education level: Not on file  Occupational History   Occupation: Personnel officer-- cleans 3 buildings    Comment: Fairly physical job    Employer: [employer]  Tobacco Use   Smoking status: Never   Smokeless tobacco: Never  Vaping Use   Vaping Use: Never used  Substance and Sexual Activity   Alcohol use: No   Drug use: No   Sexual activity: Not Currently  Other Topics Concern   Not on file  Social History Narrative   Lives with brother and mother   Mother is quite sick, he and his brother take turns taking care of her   No children   Social Determinants of Health   Financial Resource Strain: Low Risk  (07/09/2021)   Overall Financial Resource Strain (CARDIA)    Difficulty of Paying Living Expenses: Not hard at all  Food Insecurity: No Food Insecurity (07/09/2021)   Hunger Vital Sign    Worried About Running Out of Food in the Last Year: Never true    Ran Out of Food in the Last Year: Never true  Transportation Needs: No Transportation Needs (07/09/2021)   PRAPARE - Hydrologist (Medical): No    Lack of Transportation (Non-Medical): No  Physical Activity: Sufficiently Active (07/09/2021)   Exercise Vital Sign    Days of Exercise per Week: 7 days    Minutes of Exercise per Session: 30 min  Stress: No Stress Concern Present (07/09/2021)   [place]    Feeling of Stress : Not at all  Social Connections: Socially Isolated (07/09/2021)   Social Connection and Isolation Panel [NHANES]    Frequency of Communication with Friends and Family: More than three times a week    Frequency of Social Gatherings with Friends and Family: More than three times a week    Attends Religious Services: Never    Marine scientist or Organizations: No    Attends  Archivist Meetings: Never    Marital Status: Never married  Intimate Partner Violence: Not At Risk (07/09/2021)   Humiliation, Afraid, Rape, and Kick questionnaire  Fear of Current or Ex-Partner: No    Emotionally Abused: No    Physically Abused: No    Sexually Abused: No    Outpatient Medications Prior to Visit  Medication Sig Dispense Refill   albuterol (PROVENTIL HFA;VENTOLIN HFA) 108 (90 Base) MCG/ACT inhaler Inhale 2 puffs into the lungs every 4 (four) hours as needed for wheezing or shortness of breath. 1 Inhaler 2   aspirin EC 81 MG tablet Take 81 mg by mouth in the morning. Swallow whole.     atorvastatin (LIPITOR) 40 MG tablet Take 1 tablet (40 mg total) by mouth daily. 90 tablet 1   cabergoline (DOSTINEX) 0.5 MG tablet Take 1 tablet (0.5 mg total) by mouth 2 (two) times a week. 25 tablet 0   cetirizine (ZYRTEC) 10 MG tablet Take 10 mg by mouth daily as needed for allergies.      diclofenac Sodium (VOLTAREN) 1 % GEL Apply 4 g topically 4 (four) times daily. 100 g 2   docusate sodium (COLACE) 100 MG capsule Take 100 mg by mouth 2 (two) times daily as needed (constipation.).     esomeprazole (NEXIUM) 40 MG capsule Take 1 capsule (40 mg total) by mouth 2 (two) times daily before a meal. 180 capsule 3   ezetimibe (ZETIA) 10 MG tablet Take 1 tablet (10 mg total) by mouth daily. 90 tablet 3   fluticasone furoate-vilanterol (BREO ELLIPTA) 200-25 MCG/INH AEPB Inhale 1 puff into the lungs daily. (Patient taking differently: Inhale 1 puff into the lungs daily as needed (respiratory issues.).) 60 each 2   losartan (COZAAR) 50 MG tablet Take 1 tablet (50 mg total) by mouth daily. 90 tablet 3   meclizine (ANTIVERT) 25 MG tablet Take 1 tablet (25 mg total) by mouth 3 (three) times daily as needed for dizziness. 21 tablet 0   metFORMIN (GLUCOPHAGE-XR) 500 MG 24 hr tablet Take 1 tablet (500 mg total) by mouth daily. 90 tablet 0   Multiple Vitamin (MULTIVITAMIN WITH MINERALS) TABS tablet  Take 1 tablet by mouth in the morning.     Semaglutide (RYBELSUS) 3 MG TABS Take 3 mg by mouth daily. 30 tablet 11   tiZANidine (ZANAFLEX) 4 MG tablet Take 1 tablet (4 mg total) by mouth 3 (three) times daily as needed for muscle spasms. 40 tablet 0   triamcinolone cream (KENALOG) 0.1 % Apply 1 application topically 2 (two) times daily. (Patient taking differently: Apply 1 application  topically 2 (two) times daily as needed (skin irritation/skin folds).) 30 g 2   No facility-administered medications prior to visit.    No Known Allergies  Review of Systems  Constitutional:  Positive for chills.  Respiratory:  Positive for cough (dry).   Cardiovascular:        (+)fast heart rate  Genitourinary:  Positive for dysuria.  Neurological:  Positive for dizziness.       Objective:    Physical Exam Constitutional:      General: He is not in acute distress.    Appearance: Normal appearance. He is not ill-appearing.  HENT:     Head: Normocephalic and atraumatic.     Right Ear: External ear normal.     Left Ear: External ear normal.  Eyes:     Extraocular Movements: Extraocular movements intact.     Pupils: Pupils are equal, round, and reactive to light.  Cardiovascular:     Rate and Rhythm: Normal rate and regular rhythm.     Heart sounds: Normal heart sounds. No murmur  heard.    No gallop.  Pulmonary:     Effort: Pulmonary effort is normal. No respiratory distress.     Breath sounds: Normal breath sounds. No wheezing or rales.  Skin:    General: Skin is warm and dry.  Neurological:     Mental Status: He is alert and oriented to person, place, and time.  Psychiatric:        Judgment: Judgment normal.     BP 110/70 (BP Location: Left Arm, Patient Position: Sitting, Cuff Size: Large)   Pulse (!) 102   Temp 98.2 F (36.8 C) (Oral)   Resp 20   Ht '6\' 2"'$  (1.88 m)   SpO2 94%   BMI 35.95 kg/m  Wt Readings from Last 3 Encounters:  11/02/22 280 lb (127 kg)  08/19/22 274 lb 8 oz  (124.5 kg)  07/14/22 279 lb 6.4 oz (126.7 kg)       Assessment & Plan:  There are no diagnoses linked to this encounter.  I, Shehryar [place], personally preformed the services described in this documentation.  All medical record entries made by the scribe were at my direction and in my presence.  I have reviewed the chart and discharge instructions (if applicable) and agree that the record reflects my personal performance and is accurate and complete. 12/30/2022   I,Shehryar Baig,acting as a scribe for [name], DO.,have documented all relevant documentation on the behalf of [name], DO,as directed by  [name], DO while in the presence of [name], DO.   Shehryar [employer]

## 2022-12-31 ENCOUNTER — Other Ambulatory Visit: Payer: Self-pay

## 2022-12-31 DIAGNOSIS — R051 Acute cough: Secondary | ICD-10-CM

## 2022-12-31 DIAGNOSIS — R6883 Chills (without fever): Secondary | ICD-10-CM

## 2022-12-31 LAB — TSH: TSH: 0.91 u[IU]/mL (ref 0.35–5.50)

## 2022-12-31 LAB — CBC WITH DIFFERENTIAL/PLATELET
Basophils Absolute: 0 10*3/uL (ref 0.0–0.1)
Basophils Relative: 0.6 % (ref 0.0–3.0)
Eosinophils Absolute: 0 10*3/uL (ref 0.0–0.7)
Eosinophils Relative: 0.1 % (ref 0.0–5.0)
HCT: 43.6 % (ref 39.0–52.0)
Hemoglobin: 14.5 g/dL (ref 13.0–17.0)
Lymphocytes Relative: 6.9 % — ABNORMAL LOW (ref 12.0–46.0)
Lymphs Abs: 0.6 10*3/uL — ABNORMAL LOW (ref 0.7–4.0)
MCHC: 33.4 g/dL (ref 30.0–36.0)
MCV: 96.3 fl (ref 78.0–100.0)
Monocytes Absolute: 0.5 10*3/uL (ref 0.1–1.0)
Monocytes Relative: 6.4 % (ref 3.0–12.0)
Neutro Abs: 6.9 10*3/uL (ref 1.4–7.7)
Neutrophils Relative %: 86 % — ABNORMAL HIGH (ref 43.0–77.0)
Platelets: 155 10*3/uL (ref 150.0–400.0)
RBC: 4.53 Mil/uL (ref 4.22–5.81)
RDW: 14 % (ref 11.5–15.5)
WBC: 8 10*3/uL (ref 4.0–10.5)

## 2022-12-31 LAB — VITAMIN B12: Vitamin B-12: 334 pg/mL (ref 211–911)

## 2022-12-31 LAB — URINE CULTURE
MICRO NUMBER:: 14632316
SPECIMEN QUALITY:: ADEQUATE

## 2022-12-31 LAB — COMPREHENSIVE METABOLIC PANEL
ALT: 45 U/L (ref 0–53)
AST: 27 U/L (ref 0–37)
Albumin: 4 g/dL (ref 3.5–5.2)
Alkaline Phosphatase: 55 U/L (ref 39–117)
BUN: 14 mg/dL (ref 6–23)
CO2: 27 mEq/L (ref 19–32)
Calcium: 9.8 mg/dL (ref 8.4–10.5)
Chloride: 100 mEq/L (ref 96–112)
Creatinine, Ser: 0.92 mg/dL (ref 0.40–1.50)
GFR: 89.47 mL/min (ref >60.00)
Glucose, Bld: 110 mg/dL — ABNORMAL HIGH (ref 70–99)
Potassium: 4.4 mEq/L (ref 3.5–5.1)
Sodium: 135 mEq/L (ref 135–145)
Total Bilirubin: 0.6 mg/dL (ref 0.2–1.2)
Total Protein: 6.8 g/dL (ref 6.0–8.3)

## 2022-12-31 LAB — HEMOGLOBIN A1C: Hgb A1c MFr Bld: 8.1 % — ABNORMAL HIGH (ref 4.6–6.5)

## 2022-12-31 LAB — LIPID PANEL
Cholesterol: 179 mg/dL (ref 0–200)
HDL: 47.7 mg/dL (ref >39.00)
LDL Cholesterol: 111 mg/dL — ABNORMAL HIGH (ref 0–99)
NonHDL: 131.15
Total CHOL/HDL Ratio: 4
Triglycerides: 100 mg/dL (ref 0.0–149.0)
VLDL: 20 mg/dL (ref 0.0–40.0)

## 2022-12-31 MED ORDER — PROMETHAZINE-DM 6.25-15 MG/5ML PO SYRP: 5.0000 mL | ORAL_SOLUTION | Freq: Four times a day (QID) | ORAL | 0 refills | Status: DC | PRN

## 2022-12-31 MED ORDER — AMOXICILLIN-POT CLAVULANATE 875-125 MG PO TABS: 1.0000 | ORAL_TABLET | Freq: Two times a day (BID) | ORAL | 0 refills | Status: DC

## 2023-01-10 ENCOUNTER — Telehealth: Payer: Self-pay | Admitting: *Deleted

## 2023-01-10 ENCOUNTER — Telehealth: Payer: Self-pay

## 2023-01-10 DIAGNOSIS — I10 Essential (primary) hypertension: Secondary | ICD-10-CM

## 2023-01-10 MED ORDER — METFORMIN HCL ER 500 MG PO TB24: 500.0000 mg | ORAL_TABLET | Freq: Every day | ORAL | 1 refills | Status: DC

## 2023-01-10 MED ORDER — CABERGOLINE 0.5 MG PO TABS: 0.5000 mg | ORAL_TABLET | ORAL | 1 refills | Status: DC

## 2023-01-10 MED ORDER — LOSARTAN POTASSIUM 50 MG PO TABS: 50.0000 mg | ORAL_TABLET | Freq: Every day | ORAL | 3 refills | Status: DC

## 2023-01-10 NOTE — Progress Notes (Signed)
  Chronic Care Management   Note  01/10/2023 Name: RHYAN RADLER MRN: [id number] DOB: 09-02-1961  Quin Hoop Gongaware is a 62 y.o. year old male who is a primary care patient of [name], DO. I reached out to Jeannine Boga by phone today in response to a referral sent by Mr. Cage Gupton Pecora's PCP.  Mr. Matlack was given information about Chronic Care Management services today including:  CCM service includes personalized support from designated clinical staff supervised by the physician, including individualized plan of care and coordination with other care providers 24/7 contact phone numbers for assistance for urgent and routine care needs. Service will only be billed when office clinical staff spend 20 minutes or more in a month to coordinate care. Only one practitioner may furnish and bill the service in a calendar month. The patient may stop CCM services at amy time (effective at the end of the month) by phone call to the office staff. The patient will be responsible for cost sharing (co-pay) or up to 20% of the service fee (after annual deductible is met)  Mr. CAIDYN HENRICKSEN  agreedto scheduling an appointment with the CCM RN Case Manager   Follow up plan: Patient agreed to scheduled appointment with RN Case Manager on 01/19/2023 Pharm d 01/24/2023(date/time).   Noreene Larsson, [place], [place] [address] Direct Dial: [phone] [email or web address]

## 2023-01-10 NOTE — Telephone Encounter (Addendum)
Pt called for refills.  Losartan, cabergoline, metformin.  Rx filled and sent in.

## 2023-01-18 DIAGNOSIS — G4733 Obstructive sleep apnea (adult) (pediatric): Secondary | ICD-10-CM | POA: Diagnosis not present

## 2023-01-19 ENCOUNTER — Telehealth: Payer: Medicare HMO

## 2023-01-24 ENCOUNTER — Ambulatory Visit (INDEPENDENT_AMBULATORY_CARE_PROVIDER_SITE_OTHER): Payer: Medicare HMO | Admitting: Pharmacist

## 2023-01-24 DIAGNOSIS — E785 Hyperlipidemia, unspecified: Secondary | ICD-10-CM

## 2023-01-24 DIAGNOSIS — E1165 Type 2 diabetes mellitus with hyperglycemia: Secondary | ICD-10-CM

## 2023-01-24 DIAGNOSIS — I2510 Atherosclerotic heart disease of native coronary artery without angina pectoris: Secondary | ICD-10-CM

## 2023-01-24 DIAGNOSIS — J4520 Mild intermittent asthma, uncomplicated: Secondary | ICD-10-CM

## 2023-01-24 MED ORDER — ATORVASTATIN CALCIUM 40 MG PO TABS: 40.0000 mg | ORAL_TABLET | Freq: Every day | ORAL | 3 refills | Status: DC

## 2023-01-24 MED ORDER — FLUTICASONE-SALMETEROL 250-50 MCG/ACT IN AEPB: 1.0000 | INHALATION_SPRAY | Freq: Two times a day (BID) | RESPIRATORY_TRACT | 3 refills | Status: DC

## 2023-01-24 MED ORDER — ACCU-CHEK GUIDE VI STRP: ORAL_STRIP | 4 refills | Status: DC

## 2023-01-24 MED ORDER — ACCU-CHEK GUIDE ME W/DEVICE KIT: PACK | 0 refills | Status: DC

## 2023-01-24 MED ORDER — ACCU-CHEK SOFTCLIX LANCETS MISC: 4 refills | Status: DC

## 2023-01-24 MED ORDER — ALBUTEROL SULFATE HFA 108 (90 BASE) MCG/ACT IN AERS: 2.0000 | INHALATION_SPRAY | RESPIRATORY_TRACT | 2 refills | Status: AC | PRN

## 2023-01-24 MED ORDER — EZETIMIBE 10 MG PO TABS: 10.0000 mg | ORAL_TABLET | Freq: Every day | ORAL | 3 refills | Status: DC

## 2023-01-24 NOTE — Patient Instructions (Signed)
Michael Pearson It was a pleasure speaking with you today.  Below is a summary of our recent visit.    Diabetes: Goal is for A1c to be less than 7.0%  Lab Results  Component Value Date   HGBA1C 8.1 (H) 12/30/2022    - Remember to check total carbohydrates on nutrition labels - 1 serving should be 15 to 20 grams of total carbohydrates  Increase non-starchy vegetables - carrots, green bean, squash, zucchini, tomatoes, onions, peppers, spinach and other green leafy vegetables, cabbage, lettuce, cucumbers, asparagus, okra (not fried), eggplant Limit sugar and processed foods (cakes, cookies, ice cream, crackers and chips) Increase fresh fruit but limit serving sizes 1/2 cup or about the size of tennis or baseball Limit red meat to no more than 1-2 times per week (serving size about the size of your palm) Choose whole grains / lean proteins - whole wheat bread, quinoa, whole grain rice (1/2 cup), fish, chicken, [place] Continue to avoid sugar and calorie containing beverages - soda, sweet tea and juice.  Choose water or unsweetened tea instead.  - continue Rybelsus 3mg  each morning and metformin ER 500mg  daily  - I sent in a prescription for a new glucometer and supplies - Recommend to check glucose once a day. - Reviewed home blood glucose goals  Fasting blood glucose goal (before meals) = 80 to 130 Blood glucose goal after a meal = less than 180   Blood pressure: Currently at goal of < 130/80  BP Readings from Last 3 Encounters:  12/30/22 110/70  11/25/22 138/74  11/05/22 136/74   - continue losartan 50mg  daily    Hyperlipidemia/ASCVD Risk Reduction: LDL not currently at goal but has been < 55 in past.  - Continue atorvastatin 40mg  daily and ezetimibe 10mg  daily - sent in updated prescriptions to [facility] - Continue to use weekly pill container to improve adherence.  - Contact clinical pharmacist at [facility] if any issues with medication arise.    Asthma: Currently  controlled with as needed use of Trelegy but cost of Trelegy is high - Recommend trial of Wixela - it is  tier 2 and $0 copay - replaces Trelegy and is you maintenance inhaler - Also sent in updated prescription for albuterol = rescue inhaler - albuterol to have on hand for as needed use.     As always if you have any questions or concerns especially regarding medications, please feel free to contact me either at the phone number below or with a MyChart message.   Keep up the good work!  [name], PharmD Clinical Pharmacist [facility] [place] [phone] (direct line)  [phone] (main office number)   Patient verbalizes understanding of instructions and care plan provided today and agrees to view in [place]. Active MyChart status and patient understanding of how to access instructions and care plan via MyChart confirmed with patient.

## 2023-01-24 NOTE — Progress Notes (Deleted)
   Established Patient Office Visit  Subjective   Patient ID: Michael Pearson, male    DOB: 03-Dec-1960  Age: 62 y.o. MRN: [id number]  No chief complaint on file.   HPI  {History (Optional):23778}  ROS    Objective:     There were no vitals taken for this visit. {Vitals History (Optional):23777}  Physical Exam   No results found for any visits on 01/24/23.  {Labs (Optional):23779}  The ASCVD Risk score (Arnett DK, et al., 2019) failed to calculate for the following reasons:   The patient has a prior MI or stroke diagnosis    Assessment & Plan:   Problem List Items Addressed This Visit   None   No follow-ups on file.    [name], RPH-CPP

## 2023-01-24 NOTE — Progress Notes (Cosign Needed Addendum)
Chronic Care Management Pharmacy Note  01/24/2023 Name:  Michael Pearson MRN:  [id number] DOB:  22-Dec-1960   Chief Complaint  Patient presents with   Chronic Care Management    Initial visit   Subjective: Michael Pearson is an 62 y.o. year old male who is a primary patient of [name], DO.  The patient was referred to the Chronic Care Management team for assistance with care management needs subsequent to provider initiation of CCM services and plan of care.    Engaged with patient by telephone for initial visit in response to provider referral for CCM services.   Subjective:  Recent medical visits:  11/02/2022 - ED Visit for Diabetic ulcer of the right foot. Xray showed no osteomyelitis. Prescribed doxycycline 100mg  twice a day and referred to podiatry.   11/05/2022 - Podiatry (Dr Posey Pronto, K) Seen for right foot ulcer. Prescribed Betadine wet-to-dry dressing with changes daily and aggressive offloading with surgical shoe.  12/30/2022 - PCP (Dr Carollee Herter) Seen for Chest pain, dizziness and chills. Prescribed Augmentin 875mg  twice a day and promethazine DM cough syrup.  Medication Access/Adherence  Current Pharmacy:  [facility], [address] AT [place] [address] [place] [address] Phone: [phone] Fax: [phone]   Patient reports affordability concerns with their medications: Yes  Patient reports access/transportation concerns to their pharmacy: No  Patient reports adherence concerns with their medications:  No      Diabetes: Current medications: Rybelsus 3mg  each morning, metformin ER 500mg  daily Started Rybelsus around 12/2021 - has lost about 20lbs since starting Rybelsus and changing diet.   Previously saw Dr Loanne Drilling for diabetes and hyperprolactinemia / pituitary adenoma  Current glucose readings: not checking blood glucose. Reports he does not have a working glucometer   Patient reports hypoglycemic s/sx  including occasional dizziness after breakfast but denies shakiness, sweating.  Patient denies hyperglycemic symptoms including no polyuria, polydipsia, polyphagia, nocturia, neuropathy, blurred vision.  Current meal patterns:  - Breakfast: cereal with 1/2 banana; 2% milks; or eggs with bacon about 1 or 2 times per week; coffe with no sugar - Lunch sandwich - tomato or peanut butter (sometimes with mashed banana)  - Supper grilled or air fried chicken or fish + vegetables (small sweet potato, broccoli, carrots, celery)  - Snacks fruit or low sugar cookies - Drinks - unsweet tea, water or unsweetened coffee.    Hypertension:  Current medications: losartan 50mg  daily   Patient has a validated, automated, upper arm home BP cuff Current blood pressure readings readings: 120's / 80's  Patient reports hypotensive s/sx including occasional dizziness, lightheadedness.  Patient denies hypertensive symptoms including no headache, chest pain, shortness of breath  Current meal patterns: see above  BP Readings from Last 3 Encounters:  12/30/22 110/70  11/25/22 138/74  11/05/22 136/74      Hyperlipidemia/ASCVD Risk Reduction  Current lipid lowering medications: atorvastatin 40mg  daily and ezetimibe 10mg  daily  Last LDL has increased significantly but patient states he was out of meds due to [facility] being out of stock.    Antiplatelet regimen: aspirin 81mg  daily   Asthma:  Current medications: Trelegy - takes as needed; has Rx for albuterol inhaler but reports he does not have one on hand.   Reports no exacerbations in the past year   Objective:  Lab Results  Component Value Date   HGBA1C 8.1 (H) 12/30/2022    Lab Results  Component Value Date   CREATININE  0.92 12/30/2022   BUN 14 12/30/2022   NA 135 12/30/2022   K 4.4 12/30/2022   CL 100 12/30/2022   CO2 27 12/30/2022    Lab Results  Component Value Date   CHOL 179 12/30/2022   HDL 47.70 12/30/2022   LDLCALC 111  (H) 12/30/2022   TRIG 100.0 12/30/2022   CHOLHDL 4 12/30/2022    Medications Reviewed Today     Reviewed by Sanda Linger, CMA (Certified Medical Assistant) on 12/30/22 at 9  Med List Status: <None>   Medication Order Taking? Sig Documenting Provider Last Dose Status Informant  albuterol (PROVENTIL HFA;VENTOLIN HFA) 108 (90 Base) MCG/ACT inhaler [id number] Yes Inhale 2 puffs into the lungs every 4 (four) hours as needed for wheezing or shortness of breath. [name], DO Taking Active Self  aspirin EC 81 MG tablet [id number] Yes Take 81 mg by mouth in the morning. Swallow whole. [provider] Taking Active Self  atorvastatin (LIPITOR) 40 MG tablet [id number] Yes Take 1 tablet (40 mg total) by mouth daily. [name], DO Taking Active   cabergoline (DOSTINEX) 0.5 MG tablet [id number] Yes Take 1 tablet (0.5 mg total) by mouth 2 (two) times a week. [name], DO Taking Active   cetirizine (ZYRTEC) 10 MG tablet [id number] Yes Take 10 mg by mouth daily as needed for allergies.  [provider] Taking Active Self  diclofenac Sodium (VOLTAREN) 1 % GEL [id number] Yes Apply 4 g topically 4 (four) times daily. [name], DO Taking Active   docusate sodium (COLACE) 100 MG capsule [id number] Yes Take 100 mg by mouth 2 (two) times daily as needed (constipation.). [provider] Taking Active Self  esomeprazole (NEXIUM) 40 MG capsule [id number] Yes Take 1 capsule (40 mg total) by mouth 2 (two) times daily before a meal. Carollee Herter, Alferd Apa, DO Taking Active   ezetimibe (ZETIA) 10 MG tablet [id number] Yes Take 1 tablet (10 mg total) by mouth daily. Carollee Herter, Kendrick Fries R, DO Taking Active   fluticasone furoate-vilanterol (BREO ELLIPTA) 200-25 MCG/INH AEPB [id number] Yes Inhale 1 puff into the lungs daily.  Patient taking differently: Inhale 1 puff into the lungs daily as needed (respiratory issues.).   Marshell Garfinkel, MD Taking  Active Self  losartan (COZAAR) 50 MG tablet [id number] Yes Take 1 tablet (50 mg total) by mouth daily. [name], DO Taking Active   meclizine (ANTIVERT) 25 MG tablet [id number] Yes Take 1 tablet (25 mg total) by mouth 3 (three) times daily as needed for dizziness. Colon Branch, MD Taking Active   metFORMIN (GLUCOPHAGE-XR) 500 MG 24 hr tablet [id number] Yes Take 1 tablet (500 mg total) by mouth daily. [name], DO Taking Active   Multiple Vitamin (MULTIVITAMIN WITH MINERALS) TABS tablet [id number] Yes Take 1 tablet by mouth in the morning. [provider] Taking Active Self  Semaglutide (RYBELSUS) 3 MG TABS [id number] Yes Take 3 mg by mouth daily. Renato Shin, MD Taking Active   tiZANidine (ZANAFLEX) 4 MG tablet [id number] Yes Take 1 tablet (4 mg total) by mouth 3 (three) times daily as needed for muscle spasms. Roma Schanz R, DO Taking Active   triamcinolone cream (KENALOG) 0.1 % [id number] Yes Apply 1 application topically 2 (two) times daily.  Patient taking differently: Apply 1 application  topically 2 (two) times daily as needed (skin irritation/skin folds).   [name], DO Taking Active Self  Medication Assistance: Patient has qualified for Medicaid for 2024. Most medications $0   Assessment/Plan:   Diabetes: Not at A1c goal of < 7.0% - Reviewed goal A1c, goal fasting, and goal 2 hour post prandial glucose - Reviewed dietary modifications including limiting intake of sweets and food high in CHO. Discussed how to read a nutrition label to assess how many carbohydrates are in a food and how to adjust serving size to get 15 grams or fewer CHO per serving - Recommend to continue Rybelsus 3mg , metformin ER 500mg  daily  - Sent in Rx for new glucometer and supplies - Recommend to check glucose once a day. - Reviewed home blood glucose goals  Fasting blood glucose goal (before meals) = 80 to 130 Blood glucose goal after a meal =  less than 180  Verified with his pharmacy that last Rybelsus Rx was $0 copay   Hypertension: Currently controlled / at goal of < 130/80 - Reviewed long term cardiovascular and renal  - Due to have renal testing - with next PCP visit (urine protein / microalbumin)    Hyperlipidemia/ASCVD Risk Reduction: LDL not currently at goal but has been < 55 in past. Suspect last LDL was elevated due to low adherence with medications.   - Recommend to continue atorvastatin 40mg  daily and ezetimibe 10mg  daily - [facility] Rx[facility] at [facility] - Continue to use weekly pill container to improve adherence.  - Contact clinical pharmacist at [facility] if any issues with medication arise.   Asthma: Currently controlled with as needed use of Trelegy but cost of Trelegy is prohibitive per patient.  - Recommend trial of Wixela or generic Advair - both of these inhalers are tier 2 and $0 coapy - Also sent in updated Rx for rescue inhaler - albuterol to have on hand for as needed use.    Meds ordered this encounter  Medications   Blood Glucose Monitoring Suppl (ACCU-CHEK GUIDE ME) w/Device KIT    Sig: Use to check blood glucose once a day (Dx: type 2 DM E11.65)    Dispense:  1 kit    Refill:  0   glucose blood (ACCU-CHEK GUIDE) test strip    Sig: Use to check blood glucose once a day (Dx: type 2 DM E11.65)    Dispense:  100 each    Refill:  4   Accu-Chek Softclix Lancets lancets    Sig: Use to check blood glucose once a day (Dx: type 2 DM E11.65)    Dispense:  100 each    Refill:  4   ezetimibe (ZETIA) 10 MG tablet    Sig: Take 1 tablet (10 mg total) by mouth daily.    Dispense:  90 tablet    Refill:  3   albuterol (VENTOLIN HFA) 108 (90 Base) MCG/ACT inhaler    Sig: Inhale 2 puffs into the lungs every 4 (four) hours as needed for wheezing or shortness of breath. (Rescue inhaler)    Dispense:  1 each    Refill:  2   atorvastatin (LIPITOR) 40 MG tablet    Sig: Take 1 tablet (40 mg total) by  mouth daily.    Dispense:  90 tablet    Refill:  3   fluticasone-salmeterol (WIXELA INHUB) 250-50 MCG/ACT AEPB    Sig: Inhale 1 puff into the lungs in the morning and at bedtime. Replaces Trelegy inhaler as Maintenance inhaler    Dispense:  60 each    Refill:  3    Follow  Up Plan: 1 month   [name], PharmD Clinical Pharmacist [facility] [place] [facility]

## 2023-01-25 ENCOUNTER — Ambulatory Visit: Payer: Medicare HMO

## 2023-01-26 NOTE — Chronic Care Management (AMB) (Signed)
Admin Error with encounter: Please disregard

## 2023-01-26 NOTE — Plan of Care (Signed)
Admin Error with encounter: Please disregard

## 2023-01-30 DIAGNOSIS — Z7984 Long term (current) use of oral hypoglycemic drugs: Secondary | ICD-10-CM

## 2023-01-30 DIAGNOSIS — I10 Essential (primary) hypertension: Secondary | ICD-10-CM | POA: Diagnosis not present

## 2023-01-30 DIAGNOSIS — J45909 Unspecified asthma, uncomplicated: Secondary | ICD-10-CM

## 2023-01-30 DIAGNOSIS — E785 Hyperlipidemia, unspecified: Secondary | ICD-10-CM | POA: Diagnosis not present

## 2023-01-30 DIAGNOSIS — E1159 Type 2 diabetes mellitus with other circulatory complications: Secondary | ICD-10-CM

## 2023-02-17 ENCOUNTER — Ambulatory Visit: Payer: Medicare HMO | Admitting: Podiatry

## 2023-02-17 DIAGNOSIS — L97511 Non-pressure chronic ulcer of other part of right foot limited to breakdown of skin: Secondary | ICD-10-CM | POA: Diagnosis not present

## 2023-02-17 NOTE — Progress Notes (Signed)
Subjective:  Patient ID: Michael Pearson, male    DOB: 12/28/1960,  MRN: [id number]  Chief Complaint  Patient presents with   Foot Ulcer    Right foot ulcer     62 y.o. male presents with the above complaint.  Patient presents with right submetatarsal 1 superficial ulceration.  He states started coming back again he went to get it evaluated denies any other acute complaints   Review of Systems: Negative except as noted in the HPI. Denies N/V/F/Ch.  Past Medical History:  Diagnosis Date   Allergy    Arthritis    Asthma    CAD (coronary artery disease)    NSTEMI 7/12:  Cardiac cath on 7/17: pLAD occluded, Dx 30-40%, pRCA 30-40%, mRCA 30-40%.  Proximal LAD was treated with a BMS.  Echo 7/19:  EF 60-65%, mild LVH.    ETT-Myoview 6/14:  Inf thinning, no ischemia, EF 64%, normal study // Myoview 04/2020: EF 57, normal perfusion; Low Risk     Cognitive communication deficit    Colitis    Diverticulosis    Duodenitis    May 2012 with heme positive stools at this time. Endorses only rare streaking of blood now.   External hemorrhoids    GERD (gastroesophageal reflux disease)    Heart murmur    as a child per the pt   Hiatal hernia    Hx of hemorrhoids    Hyperlipidemia    Hypertension    Kidney stones    Microadenoma    MRI in 2008 demonstrating 4.6 mm area of pituitary gland    Migraines    Has been evaluated multiple times in past for chronic headache in which pt has had occasional nose bleeds and bloodshot eyes. This lasted for 4-5 months. CT of the head was negative in May 2012.   Myocardial infarction (HCC) 05/17/2011   Pituitary tumor    RBBB (right bundle branch block)    Noted on EKG in 2008   Ulcer     Current Outpatient Medications:    Accu-Chek Softclix Lancets lancets, Use to check blood glucose once a day (Dx: type 2 DM E11.65), Disp: 100 each, Rfl: 4   albuterol (VENTOLIN HFA) 108 (90 Base) MCG/ACT inhaler, Inhale 2 puffs into the lungs every 4 (four) hours as needed  for wheezing or shortness of breath. (Rescue inhaler), Disp: 1 each, Rfl: 2   aspirin EC 81 MG tablet, Take 81 mg by mouth in the morning. Swallow whole., Disp: , Rfl:    atorvastatin (LIPITOR) 40 MG tablet, Take 1 tablet (40 mg total) by mouth daily., Disp: 90 tablet, Rfl: 3   Blood Glucose Monitoring Suppl (ACCU-CHEK GUIDE ME) w/Device KIT, Use to check blood glucose once a day (Dx: type 2 DM E11.65), Disp: 1 kit, Rfl: 0   cabergoline (DOSTINEX) 0.5 MG tablet, Take 1 tablet (0.5 mg total) by mouth 2 (two) times a week., Disp: 25 tablet, Rfl: 1   cetirizine (ZYRTEC) 10 MG tablet, Take 10 mg by mouth daily as needed for allergies. , Disp: , Rfl:    diclofenac Sodium (VOLTAREN) 1 % GEL, Apply 4 g topically 4 (four) times daily., Disp: 100 g, Rfl: 2   docusate sodium (COLACE) 100 MG capsule, Take 100 mg by mouth 2 (two) times daily as needed (constipation.)., Disp: , Rfl:    esomeprazole (NEXIUM) 40 MG capsule, Take 1 capsule (40 mg total) by mouth 2 (two) times daily before a meal., Disp: 180 capsule, Rfl: 3  ezetimibe (ZETIA) 10 MG tablet, Take 1 tablet (10 mg total) by mouth daily., Disp: 90 tablet, Rfl: 3   fluticasone-salmeterol (WIXELA INHUB) 250-50 MCG/ACT AEPB, Inhale 1 puff into the lungs in the morning and at bedtime. Replaces Trelegy inhaler as Maintenance inhaler, Disp: 60 each, Rfl: 3   glucose blood (ACCU-CHEK GUIDE) test strip, Use to check blood glucose once a day (Dx: type 2 DM E11.65), Disp: 100 each, Rfl: 4   losartan (COZAAR) 50 MG tablet, Take 1 tablet (50 mg total) by mouth daily., Disp: 90 tablet, Rfl: 3   meclizine (ANTIVERT) 25 MG tablet, Take 1 tablet (25 mg total) by mouth 3 (three) times daily as needed for dizziness., Disp: 21 tablet, Rfl: 0   metFORMIN (GLUCOPHAGE-XR) 500 MG 24 hr tablet, Take 1 tablet (500 mg total) by mouth daily., Disp: 90 tablet, Rfl: 1   Multiple Vitamin (MULTIVITAMIN WITH MINERALS) TABS tablet, Take 1 tablet by mouth in the morning., Disp: , Rfl:     Semaglutide (RYBELSUS) 3 MG TABS, Take 3 mg by mouth daily., Disp: 30 tablet, Rfl: 11   tiZANidine (ZANAFLEX) 4 MG tablet, Take 1 tablet (4 mg total) by mouth 3 (three) times daily as needed for muscle spasms., Disp: 40 tablet, Rfl: 0  Social History   Tobacco Use  Smoking Status Never  Smokeless Tobacco Never    No Known Allergies Objective:   There were no vitals filed for this visit.  There is no height or weight on file to calculate BMI. Constitutional Well developed. Well nourished.  Vascular Dorsalis pedis pulses palpable bilaterally. Posterior tibial pulses palpable bilaterally. Capillary refill normal to all digits.  No cyanosis or clubbing noted. Pedal hair growth normal.  Neurologic Normal speech. Oriented to person, place, and time. Epicritic sensation to light touch grossly present bilaterally.  Dermatologic Right submetatarsal 1 ulceration noted limited to the breakdown of skin granular wound bed no malodor present note does not probe down to bone.  No erythema noted  Orthopedic: Normal joint ROM without pain or crepitus bilaterally. No visible deformities. No bony tenderness.   Radiographs: None Assessment:   No diagnosis found.  Plan:  Patient was evaluated and treated and all questions answered.  Right submetatarsal 1 ulceration limited to the breakdown of the skin~recurrence -All questions and concerns were discussed with the patient in extensive detail. -Given the amount of year well ulceration is present patient will benefit from Betadine wet-to-dry dressing and aggressive offloading.  He already has surgical shoe at home and will continue wearing that. -Betadine wet-to-dry dressing changes daily   No follow-ups on file.

## 2023-02-18 DIAGNOSIS — G4733 Obstructive sleep apnea (adult) (pediatric): Secondary | ICD-10-CM | POA: Diagnosis not present

## 2023-02-23 ENCOUNTER — Ambulatory Visit (INDEPENDENT_AMBULATORY_CARE_PROVIDER_SITE_OTHER): Payer: Medicare HMO | Admitting: Pharmacist

## 2023-02-23 DIAGNOSIS — J4520 Mild intermittent asthma, uncomplicated: Secondary | ICD-10-CM

## 2023-02-23 DIAGNOSIS — E785 Hyperlipidemia, unspecified: Secondary | ICD-10-CM

## 2023-02-23 DIAGNOSIS — I2510 Atherosclerotic heart disease of native coronary artery without angina pectoris: Secondary | ICD-10-CM

## 2023-02-23 DIAGNOSIS — E1165 Type 2 diabetes mellitus with hyperglycemia: Secondary | ICD-10-CM

## 2023-02-23 MED ORDER — ONETOUCH VERIO VI STRP: ORAL_STRIP | 5 refills | Status: DC

## 2023-02-23 MED ORDER — ONETOUCH DELICA PLUS LANCET33G MISC: 5 refills | Status: DC

## 2023-02-23 MED ORDER — ONETOUCH VERIO FLEX SYSTEM W/DEVICE KIT: PACK | 0 refills | Status: AC

## 2023-02-23 MED ORDER — RYBELSUS 3 MG PO TABS: 3.0000 mg | ORAL_TABLET | Freq: Every day | ORAL | 3 refills | Status: DC

## 2023-02-23 NOTE — Progress Notes (Signed)
Chronic Care Management Pharmacy Note  02/23/2023 Name:  Michael Pearson MRN:  [id number] DOB:  Jan 04, 1961  Chief Complaint  Patient presents with   Chronic Care Management    Follow up     Subjective: Michael Pearson is an 62 y.o. year old male who is a primary patient of Michael Schultz, DO.  The patient was referred to the Chronic Care Management team for assistance with care management needs subsequent to provider initiation of CCM services and plan of care.    Engaged with patient by telephone for follow up visit in response to provider referral for CCM services.   Subjective:  Recent medical visits:  02/17/2023 - Podiatrist (Dr Allena Katz) Seen for Right submetatarsal 1 ulceration limited to the breakdown of the skin~recurrence -Given the amount of year well ulceration is present patient will benefit from Betadine wet-to-dry dressing and aggressive offloading.  He already has surgical shoe at home and will continue wearing that. -Betadine wet-to-dry dressing changes daily  Medication Access/Adherence  Current Pharmacy:  [id number] - [place], [place] - [address] AT [address] [place] [address] Phone: [phone] Fax: [phone]   Patient reports affordability concerns with their medications: Yes  Patient reports access/transportation concerns to their pharmacy: No  Patient reports adherence concerns with their medications:  No      Diabetes: Current medications: Rybelsus 3mg  each morning, metformin ER 500mg  daily Started Rybelsus around 12/2021 - has lost about 20lbs since starting Rybelsus and changing diet.   Previously saw Dr Everardo All for diabetes and hyperprolactinemia / pituitary adenoma  Current glucose readings: not checking blood glucose. Reports he does not have a working glucometer. Sent in Rx for Accu-Check Guide Me at our last visit but per patient [facility] only filled lancets.   Patient reports hypoglycemic s/sx  including occasional dizziness after breakfast but denies shakiness, sweating.  Patient denies hyperglycemic symptoms including no polyuria, polydipsia, polyphagia, nocturia, neuropathy, blurred vision.  Current meal patterns - no changes.  - Breakfast: cereal with 1/2 banana; 2% milks; or eggs with bacon about 1 or 2 times per week; coffe with no sugar - Lunch sandwich - tomato or peanut butter (sometimes with mashed banana)  - Supper grilled or air fried chicken or fish + vegetables (small sweet potato, broccoli, carrots, celery)  - Snacks fruit or low sugar cookies - Drinks - unsweet tea, water or unsweetened coffee.    Hypertension:  Current medications: losartan 50mg  daily   Patient has a validated, automated, upper arm home BP cuff Current blood pressure readings readings: 118 to 130 / 80's  Patient reports hypotensive s/sx including occasional dizziness, lightheadedness.  Patient denies hypertensive symptoms including no headache, chest pain, shortness of breath  Current meal patterns: see above  BP Readings from Last 3 Encounters:  12/30/22 110/70  11/25/22 138/74  11/05/22 136/74      Hyperlipidemia/ASCVD Risk Reduction  Current lipid lowering medications: atorvastatin 40mg  daily and ezetimibe 10mg  daily  Last LDL has increased significantly but patient states he was out of meds due to [facility] being out of stock.  Verified he picked up both atorvastaitn and ezetimbe for 90 day supply on 02/04/2023  Antiplatelet regimen: aspirin 81mg  daily   Asthma:  Current medications:  Wixela started at last visit due to high cost of Trelegy. Patient reports cost of Wixlea was $15 where as Trelegy was $118 Wixela - inhaler 1 puff twice a day (patient uses as needed - has  used twice in the last month)   Also has albuterol inhaler - has used twice in the last month.   Reports no exacerbations in the past year   Objective:  Lab Results  Component Value Date   HGBA1C 8.1 (H)  12/30/2022    Lab Results  Component Value Date   CREATININE 0.92 12/30/2022   BUN 14 12/30/2022   NA 135 12/30/2022   K 4.4 12/30/2022   CL 100 12/30/2022   CO2 27 12/30/2022    Lab Results  Component Value Date   CHOL 179 12/30/2022   HDL 47.70 12/30/2022   LDLCALC 111 (H) 12/30/2022   TRIG 100.0 12/30/2022   CHOLHDL 4 12/30/2022    Medications Reviewed Today     Reviewed by Henrene Pastor, RPH-CPP (Pharmacist) on 02/23/23 at 1057  Med List Status: <None>   Medication Order Taking? Sig Documenting Provider Last Dose Status Informant  Accu-Chek Softclix Lancets lancets [id number]  Use to check blood glucose once a day (Dx: type 2 DM E11.65) Zola Button, Grayling Congress, DO  Active   albuterol (VENTOLIN HFA) 108 (90 Base) MCG/ACT inhaler [id number] Yes Inhale 2 puffs into the lungs every 4 (four) hours as needed for wheezing or shortness of breath. (Rescue inhaler) Michael Schultz, DO Taking Active   aspirin EC 81 MG tablet [id number] Yes Take 81 mg by mouth in the morning. Swallow whole. [provider] Taking Active Self  atorvastatin (LIPITOR) 40 MG tablet [id number] Yes Take 1 tablet (40 mg total) by mouth daily. Michael Schultz, DO Taking Active   Blood Glucose Monitoring Suppl (ACCU-CHEK GUIDE ME) w/Device KIT [id number]  Use to check blood glucose once a day (Dx: type 2 DM E11.65) Zola Button, Grayling Congress, DO  Active   cabergoline (DOSTINEX) 0.5 MG tablet [id number] Yes Take 1 tablet (0.5 mg total) by mouth 2 (two) times a week. Michael Schultz, DO Taking Active   cetirizine (ZYRTEC) 10 MG tablet [id number] Yes Take 10 mg by mouth daily as needed for allergies.  [provider] Taking Active Self  diclofenac Sodium (VOLTAREN) 1 % GEL [id number] No Apply 4 g topically 4 (four) times daily. Seabron Spates R, DO Unknown Active   docusate sodium (COLACE) 100 MG capsule [id number] Yes Take 100 mg by mouth 2 (two) times daily as needed (constipation.).  [provider] Taking Active Self  esomeprazole (NEXIUM) 40 MG capsule [id number] Yes Take 1 capsule (40 mg total) by mouth 2 (two) times daily before a meal. Zola Button, Grayling Congress, DO Taking Active   ezetimibe (ZETIA) 10 MG tablet [id number] Yes Take 1 tablet (10 mg total) by mouth daily. Zola Button, Myrene Buddy R, DO Taking Active   fluticasone-salmeterol [facility] INHUB) 250-50 MCG/ACT AEPB [id number] Yes Inhale 1 puff into the lungs in the morning and at bedtime. Replaces Trelegy inhaler as Maintenance inhaler Michael Schultz, DO Taking Active   glucose blood (ACCU-CHEK GUIDE) test strip [id number]  Use to check blood glucose once a day (Dx: type 2 DM E11.65) Zola Button, Grayling Congress, DO  Active   losartan (COZAAR) 50 MG tablet [id number] Yes Take 1 tablet (50 mg total) by mouth daily. Michael Schultz, DO Taking Active   meclizine (ANTIVERT) 25 MG tablet [id number] Yes Take 1 tablet (25 mg total) by mouth 3 (three) times daily as needed for dizziness. Wanda Plump, MD Taking Active   metFORMIN (GLUCOPHAGE-XR) 500 MG 24 hr tablet [id number] Yes  Take 1 tablet (500 mg total) by mouth daily. Michael Schultz, DO Taking Active   Multiple Vitamin (MULTIVITAMIN WITH MINERALS) TABS tablet [id number] Yes Take 1 tablet by mouth in the morning. [provider] Taking Active Self  Semaglutide (RYBELSUS) 3 MG TABS [id number] Yes Take 3 mg by mouth daily. Romero Belling, MD Taking Active   tiZANidine (ZANAFLEX) 4 MG tablet [id number] Yes Take 1 tablet (4 mg total) by mouth 3 (three) times daily as needed for muscle spasms. Michael Schultz, DO Taking Active             Medication Assistance: Patient has qualified for Medicaid for 2024. Most medications $0   Assessment/Plan:   Diabetes: Not at A1c goal of < 7.0% - Reviewed goal A1c, goal fasting, and goal 2 hour post prandial glucose - Recommend to continue Rybelsus , metformin ER  daily  - Called [facility] and Accu Check  GuideMe was not covered by patient's [employer] plan. Sent in Rx for new glucometer and supplies for One Touch Verio Flex which is covered by [employer]. - Recommend to check glucose once a day. - Reviewed home blood glucose goals  Fasting blood glucose goal (before meals) = 80 to 130 Blood glucose goal after a meal = less than 180  Verified with his pharmacy that last Rybelsus Rx was $0 copay - updated for 90 days supply   Hypertension: Currently controlled / at goal of < 130/80 - Reviewed long term cardiovascular and renal  - Due to have renal testing - with next PCP visit (urine protein / microalbumin)    Hyperlipidemia/ASCVD Risk Reduction: LDL not currently at goal but has been < 55 in past. Suspect last LDL was elevated due to low adherence with medications.   - Continue atorvastatin  daily and ezetimibe  daily  - Continue to use weekly pill container to improve adherence.  - Contact clinical pharmacist at [facility] if any issues with medication arise.   Asthma: Currently controlled with as needed use of Wixela  - Continue Wixela and Albuterol as needed  Health Maintenance:  - Patient reminded to get yearly eye exam. He asked about getting list of ophthalmologist / optometrists that are covered by [facility] / Medicaid. Mailed list to him - Patient asked about hearing test and who accepts [employer]. Mailed list to patient of area audiologist that were listed on [employer]'s Medicare site that also take Medicaid.     Meds ordered this encounter  Medications   Blood Glucose Monitoring Suppl (ONETOUCH VERIO FLEX SYSTEM) w/Device KIT    Sig: Use to check blood glucose once a day (Dx: type 2 DM E11.65)    Dispense:  1 kit    Refill:  0   glucose blood (ONETOUCH VERIO) test strip    Sig: Use to check blood glucose once a day (Dx: type 2 DM E11.65)    Dispense:  100 each    Refill:  5   Lancets (ONETOUCH DELICA PLUS LANCET33G) MISC    Sig: Use to check blood glucose once a day (Dx:  type 2 DM E11.65)    Dispense:  100 each    Refill:  5   Semaglutide (RYBELSUS) 3 MG TABS    Sig: Take 1 tablet (3 mg total) by mouth daily.    Dispense:  100 tablet    Refill:  3    Follow Up Plan: 1 month   Henrene Pastor, PharmD Clinical Pharmacist [facility] SW  [facility]

## 2023-03-01 DIAGNOSIS — E1165 Type 2 diabetes mellitus with hyperglycemia: Secondary | ICD-10-CM

## 2023-03-01 DIAGNOSIS — I2510 Atherosclerotic heart disease of native coronary artery without angina pectoris: Secondary | ICD-10-CM

## 2023-03-01 DIAGNOSIS — J4520 Mild intermittent asthma, uncomplicated: Secondary | ICD-10-CM

## 2023-03-01 DIAGNOSIS — E785 Hyperlipidemia, unspecified: Secondary | ICD-10-CM

## 2023-03-14 DIAGNOSIS — H906 Mixed conductive and sensorineural hearing loss, bilateral: Secondary | ICD-10-CM | POA: Diagnosis not present

## 2023-03-18 ENCOUNTER — Ambulatory Visit: Payer: Medicare HMO | Admitting: Podiatry

## 2023-03-18 DIAGNOSIS — L97511 Non-pressure chronic ulcer of other part of right foot limited to breakdown of skin: Secondary | ICD-10-CM | POA: Diagnosis not present

## 2023-03-18 MED ORDER — SILVER SULFADIAZINE 1 % EX CREA: 1.0000 | TOPICAL_CREAM | Freq: Every day | CUTANEOUS | 0 refills | Status: AC

## 2023-03-18 NOTE — Progress Notes (Signed)
Subjective:  Patient ID: Michael Pearson, male    DOB: January 13, 1961,  MRN: [id number]  Chief Complaint  Patient presents with   Foot Ulcer    Right foot ulcer     62 y.o. male presents with the above complaint.  Patient presents with right submetatarsal 1 superficial ulceration.  He states started coming back again he went to get it evaluated denies any other acute complaints   Review of Systems: Negative except as noted in the HPI. Denies N/V/F/Ch.  Past Medical History:  Diagnosis Date   Allergy    Arthritis    Asthma    CAD (coronary artery disease)    NSTEMI 7/12:  Cardiac cath on 7/17: pLAD occluded, Dx 30-40%, pRCA 30-40%, mRCA 30-40%.  Proximal LAD was treated with a BMS.  Echo 7/19:  EF 60-65%, mild LVH.    ETT-Myoview 6/14:  Inf thinning, no ischemia, EF 64%, normal study // Myoview 04/2020: EF 57, normal perfusion; Low Risk     Cognitive communication deficit    Colitis    Diverticulosis    Duodenitis    May 2012 with heme positive stools at this time. Endorses only rare streaking of blood now.   External hemorrhoids    GERD (gastroesophageal reflux disease)    Heart murmur    as a child per the pt   Hiatal hernia    Hx of hemorrhoids    Hyperlipidemia    Hypertension    Kidney stones    Microadenoma    MRI in 2008 demonstrating 4.6 mm area of pituitary gland    Migraines    Has been evaluated multiple times in past for chronic headache in which pt has had occasional nose bleeds and bloodshot eyes. This lasted for 4-5 months. CT of the head was negative in May 2012.   Myocardial infarction (HCC) 05/17/2011   Pituitary tumor    RBBB (right bundle branch block)    Noted on EKG in 2008   Ulcer     Current Outpatient Medications:    Accu-Chek Softclix Lancets lancets, Use to check blood glucose once a day (Dx: type 2 DM E11.65), Disp: 100 each, Rfl: 4   albuterol (VENTOLIN HFA) 108 (90 Base) MCG/ACT inhaler, Inhale 2 puffs into the lungs every 4 (four) hours as needed  for wheezing or shortness of breath. (Rescue inhaler), Disp: 1 each, Rfl: 2   aspirin EC 81 MG tablet, Take 81 mg by mouth in the morning. Swallow whole., Disp: , Rfl:    atorvastatin (LIPITOR) 40 MG tablet, Take 1 tablet (40 mg total) by mouth daily., Disp: 90 tablet, Rfl: 3   Blood Glucose Monitoring Suppl (ACCU-CHEK GUIDE ME) w/Device KIT, Use to check blood glucose once a day (Dx: type 2 DM E11.65), Disp: 1 kit, Rfl: 0   cabergoline (DOSTINEX) 0.5 MG tablet, Take 1 tablet (0.5 mg total) by mouth 2 (two) times a week., Disp: 25 tablet, Rfl: 1   cetirizine (ZYRTEC) 10 MG tablet, Take 10 mg by mouth daily as needed for allergies. , Disp: , Rfl:    diclofenac Sodium (VOLTAREN) 1 % GEL, Apply 4 g topically 4 (four) times daily., Disp: 100 g, Rfl: 2   docusate sodium (COLACE) 100 MG capsule, Take 100 mg by mouth 2 (two) times daily as needed (constipation.)., Disp: , Rfl:    esomeprazole (NEXIUM) 40 MG capsule, Take 1 capsule (40 mg total) by mouth 2 (two) times daily before a meal., Disp: 180 capsule, Rfl: 3  ezetimibe (ZETIA) 10 MG tablet, Take 1 tablet (10 mg total) by mouth daily., Disp: 90 tablet, Rfl: 3   fluticasone-salmeterol (WIXELA INHUB) 250-50 MCG/ACT AEPB, Inhale 1 puff into the lungs in the morning and at bedtime. Replaces Trelegy inhaler as Maintenance inhaler, Disp: 60 each, Rfl: 3   glucose blood (ACCU-CHEK GUIDE) test strip, Use to check blood glucose once a day (Dx: type 2 DM E11.65), Disp: 100 each, Rfl: 4   losartan (COZAAR) 50 MG tablet, Take 1 tablet (50 mg total) by mouth daily., Disp: 90 tablet, Rfl: 3   meclizine (ANTIVERT) 25 MG tablet, Take 1 tablet (25 mg total) by mouth 3 (three) times daily as needed for dizziness., Disp: 21 tablet, Rfl: 0   metFORMIN (GLUCOPHAGE-XR) 500 MG 24 hr tablet, Take 1 tablet (500 mg total) by mouth daily., Disp: 90 tablet, Rfl: 1   Multiple Vitamin (MULTIVITAMIN WITH MINERALS) TABS tablet, Take 1 tablet by mouth in the morning., Disp: , Rfl:     Semaglutide (RYBELSUS) 3 MG TABS, Take 3 mg by mouth daily., Disp: 30 tablet, Rfl: 11   tiZANidine (ZANAFLEX) 4 MG tablet, Take 1 tablet (4 mg total) by mouth 3 (three) times daily as needed for muscle spasms., Disp: 40 tablet, Rfl: 0  Social History   Tobacco Use  Smoking Status Never  Smokeless Tobacco Never    No Known Allergies Objective:   There were no vitals filed for this visit.  There is no height or weight on file to calculate BMI. Constitutional Well developed. Well nourished.  Vascular Dorsalis pedis pulses palpable bilaterally. Posterior tibial pulses palpable bilaterally. Capillary refill normal to all digits.  No cyanosis or clubbing noted. Pedal hair growth normal.  Neurologic Normal speech. Oriented to person, place, and time. Epicritic sensation to light touch grossly present bilaterally.  Dermatologic Right submetatarsal 1 ulceration noted limited to the breakdown of skin granular wound bed no malodor present note does not probe down to bone.  No erythema noted  Orthopedic: Normal joint ROM without pain or crepitus bilaterally. No visible deformities. No bony tenderness.   Radiographs: None Assessment:   No diagnosis found.  Plan:  Patient was evaluated and treated and all questions answered.  Right submetatarsal 1 ulceration limited to the breakdown of the skin~recurrence -All questions and concerns were discussed with the patient in extensive detail. -Given the amount of year well ulceration is present patient Silvadene cream.  Patient will transition from Betadine wet-to-dry to 70 -Silvadene was sent to the pharmacy   No follow-ups on file.

## 2023-03-20 DIAGNOSIS — G4733 Obstructive sleep apnea (adult) (pediatric): Secondary | ICD-10-CM | POA: Diagnosis not present

## 2023-03-23 ENCOUNTER — Ambulatory Visit (INDEPENDENT_AMBULATORY_CARE_PROVIDER_SITE_OTHER): Payer: Medicare HMO | Admitting: Pharmacist

## 2023-03-23 DIAGNOSIS — E1165 Type 2 diabetes mellitus with hyperglycemia: Secondary | ICD-10-CM

## 2023-03-23 DIAGNOSIS — I10 Essential (primary) hypertension: Secondary | ICD-10-CM

## 2023-03-23 DIAGNOSIS — E785 Hyperlipidemia, unspecified: Secondary | ICD-10-CM

## 2023-03-23 DIAGNOSIS — I2510 Atherosclerotic heart disease of native coronary artery without angina pectoris: Secondary | ICD-10-CM

## 2023-03-23 DIAGNOSIS — J4520 Mild intermittent asthma, uncomplicated: Secondary | ICD-10-CM

## 2023-03-23 NOTE — Progress Notes (Cosign Needed Addendum)
Chronic Care Management Pharmacy Note  03/23/2023 Name:  Michael Pearson MRN:  [id number] DOB:  February 28, 1961  Chief Complaint  Patient presents with   Chronic Care Management    Follow up      Subjective: Michael Pearson is an 62 y.o. year old male who is a primary patient of Donato Schultz, DO.  The patient was referred to the Chronic Care Management team for assistance with care management needs subsequent to provider initiation of CCM services and plan of care.    Engaged with patient by telephone for follow up visit in response to provider referral for CCM services.   Subjective:  Recent medical visits:  03/18/2023- Podiatry (Dr Allena Katz) patient was seen for foot ulcer. Prescribed silver sulfadiazine cream 02/17/2023 - Podiatrist (Dr Allena Katz) Seen for Right submetatarsal 1 ulceration limited to the breakdown of the skin~recurrence -Given the amount of year well ulceration is present patient will benefit from Betadine wet-to-dry dressing and aggressive offloading.  He already has surgical shoe at home and will continue wearing that. -Betadine wet-to-dry dressing changes daily  Medication Access/Adherence  Current Pharmacy:  [id number] - [place], [place] - [address] AT [address] [place] [address] Phone: [phone] Fax: [phone]   Patient reports affordability concerns with their medications: Yes  Patient reports access/transportation concerns to their pharmacy: No  Patient reports adherence concerns with their medications:  Yes - has not been testing blood glucose because he has not received One Touch Verio meter and supplies (Rx was sent to [facility] 02/23/2023)  Patient also asks if any of his medications could affect his libido. He reports that he has been aroused by TV shows and movies recently.      Diabetes: Current medications: Rybelsus 3mg  each morning, metformin ER 500mg  daily Started Rybelsus around 12/2021 - has lost  about 20lbs since starting Rybelsus and changing diet.   Previously saw Dr Everardo All for diabetes and hyperprolactinemia / pituitary adenoma  Current glucose readings: not checking blood glucose. Reports he does not have a working glucometer. Sent in Rx for One Touch Verio to [facility] but patient states he has not received yet.    Patient reports hypoglycemic s/sx including occasional dizziness after breakfast but denies shakiness, sweating.  Patient denies hyperglycemic symptoms including no polyuria, polydipsia, polyphagia, nocturia, neuropathy, blurred vision.  Current meal patterns - no changes.  - Breakfast: cereal with 1/2 banana; 2% milks; or eggs with bacon about 1 or 2 times per week; coffe with no sugar - Lunch sandwich - tomato or peanut butter (sometimes with mashed banana)  - Supper grilled or air fried chicken or fish + vegetables (small sweet potato, broccoli, carrots, celery)  - Snacks fruit or low sugar cookies - Drinks - unsweet tea, water or unsweetened coffee.    Hypertension:  Current medications: losartan 50mg  daily   Patient has a validated, automated, upper arm home BP cuff Current blood pressure readings readings: 110 to 135 / 80's  Patient reports hypotensive s/sx including occasional dizziness, lightheadedness.  Patient denies hypertensive symptoms including no headache, chest pain, shortness of breath  Current meal patterns: see above  BP Readings from Last 3 Encounters:  12/30/22 110/70  11/25/22 138/74  11/05/22 136/74      Hyperlipidemia/ASCVD Risk Reduction  Current lipid lowering medications: atorvastatin 40mg  daily and ezetimibe 10mg  daily  Last LDL had increased significantly but patient states he was out of meds due to [facility] being out of stock.  Verified he picked up both atorvastaitn and ezetimbe for 90 day supply on 02/04/2023  Antiplatelet regimen: aspirin 81mg  daily   Asthma:  Current medications:  Wixela started at last visit  due to high cost of Trelegy. Patient reports cost of Wixlea was $15 where as Trelegy was $118.  He feels that [place] is working well. Has not needed to use albuterol inhaler in the last 2 weeks.  Wixela - inhaler 1 puff twice a day (patient uses as needed - has used twice in the last month)   Also has albuterol inhaler to use as needed.   Reports no exacerbations in the past year   Objective:  Lab Results  Component Value Date   HGBA1C 8.1 (H) 12/30/2022    Lab Results  Component Value Date   CREATININE 0.92 12/30/2022   BUN 14 12/30/2022   NA 135 12/30/2022   K 4.4 12/30/2022   CL 100 12/30/2022   CO2 27 12/30/2022    Lab Results  Component Value Date   CHOL 179 12/30/2022   HDL 47.70 12/30/2022   LDLCALC 111 (H) 12/30/2022   TRIG 100.0 12/30/2022   CHOLHDL 4 12/30/2022    Medications Reviewed Today     Reviewed by Henrene Pastor, RPH-CPP (Pharmacist) on 03/23/23 at 1022  Med List Status: <None>   Medication Order Taking? Sig Documenting Provider Last Dose Status Informant  albuterol (VENTOLIN HFA) 108 (90 Base) MCG/ACT inhaler [id number] Yes Inhale 2 puffs into the lungs every 4 (four) hours as needed for wheezing or shortness of breath. (Rescue inhaler) Donato Schultz, DO Taking Active   aspirin EC 81 MG tablet [id number] Yes Take 81 mg by mouth in the morning. Swallow whole. [provider] Taking Active Self  atorvastatin (LIPITOR) 40 MG tablet [id number] Yes Take 1 tablet (40 mg total) by mouth daily. Seabron Spates R, DO Taking Active   Blood Glucose Monitoring Suppl (ONETOUCH VERIO FLEX SYSTEM) w/Device KIT [id number] No Use to check blood glucose once a day (Dx: type 2 DM E11.65)  Patient not taking: Reported on 03/23/2023   Donato Schultz, DO Not Taking Active   cabergoline (DOSTINEX) 0.5 MG tablet [id number] Yes Take 1 tablet (0.5 mg total) by mouth 2 (two) times a week. Donato Schultz, DO Taking Active   cetirizine (ZYRTEC) 10 MG  tablet [id number]  Take 10 mg by mouth daily as needed for allergies.  [provider]  Active Self  diclofenac Sodium (VOLTAREN) 1 % GEL [id number] Yes Apply 4 g topically 4 (four) times daily. Donato Schultz, DO Taking Active   docusate sodium (COLACE) 100 MG capsule [id number]  Take 100 mg by mouth 2 (two) times daily as needed (constipation.). [provider]  Active Self  esomeprazole (NEXIUM) 40 MG capsule [id number] Yes Take 1 capsule (40 mg total) by mouth 2 (two) times daily before a meal. Zola Button, Grayling Congress, DO Taking Active   ezetimibe (ZETIA) 10 MG tablet [id number] Yes Take 1 tablet (10 mg total) by mouth daily. Zola Button, Myrene Buddy R, DO Taking Active   fluticasone-salmeterol [facility] INHUB) 250-50 MCG/ACT AEPB [id number] Yes Inhale 1 puff into the lungs in the morning and at bedtime. Replaces Trelegy inhaler as Maintenance inhaler Donato Schultz, DO Taking Active   glucose blood (ONETOUCH VERIO) test strip [id number] No Use to check blood glucose once a day (Dx: type 2 DM E11.65)  Patient not taking: Reported on 03/23/2023   [facility]  Irish Elders, DO Not Taking Active   Lancets Letta Pate DELICA PLUS [place]) MISC [id number] No Use to check blood glucose once a day (Dx: type 2 DM E11.65)  Patient not taking: Reported on 03/23/2023   Donato Schultz, DO Not Taking Active   losartan (COZAAR) 50 MG tablet [id number] Yes Take 1 tablet (50 mg total) by mouth daily. Donato Schultz, DO Taking Active   meclizine (ANTIVERT) 25 MG tablet [id number]  Take 1 tablet (25 mg total) by mouth 3 (three) times daily as needed for dizziness. Wanda Plump, MD  Active   metFORMIN (GLUCOPHAGE-XR) 500 MG 24 hr tablet [id number] Yes Take 1 tablet (500 mg total) by mouth daily. Donato Schultz, DO Taking Active   Multiple Vitamin (MULTIVITAMIN WITH MINERALS) TABS tablet [id number] Yes Take 1 tablet by mouth in the morning. [provider] Taking Active Self   Semaglutide (RYBELSUS) 3 MG TABS [id number] Yes Take 1 tablet (3 mg total) by mouth daily. Seabron Spates R, DO Taking Active   silver sulfADIAZINE (SILVADENE) 1 % cream [id number] Yes Apply 1 Application topically daily. Candelaria Stagers, DPM Taking Active   tiZANidine (ZANAFLEX) 4 MG tablet [id number]  Take 1 tablet (4 mg total) by mouth 3 (three) times daily as needed for muscle spasms. Donato Schultz, DO  Active             Medication Assistance: Patient has qualified for Medicaid for 2024. Most medications $0   Assessment/Plan:   Diabetes: Not at A1c goal of < 7.0% - Reviewed goal A1c, goal fasting, and goal 2 hour post prandial glucose - Recommend to continue Rybelsus 3mg , metformin ER 500mg  daily  - Called [facility] to check on One Touch meter and supplies. Pharmacist states it was filled 04/24 but was not picked up in 10 days so was returned to stock. They will fill today. I notified patient he can pick up later today.  - Recommend to check glucose once a day. - Reviewed home blood glucose goals  Fasting blood glucose goal (before meals) = 80 to 130 Blood glucose goal after a meal = less than 180  Verified with his pharmacy that last Rybelsus Rx was $0 copay    Hypertension: Currently controlled / at goal of < 130/80 - Reviewed long term cardiovascular and renal  - Due to have renal testing - with next PCP visit (urine protein / microalbumin)    Hyperlipidemia/ASCVD Risk Reduction: LDL not currently at goal but has been < 55 in past. Suspect last LDL was elevated due to low adherence with medications.   - Continue atorvastatin 40mg  daily and ezetimibe 10mg  daily  - Continue to use weekly pill container to improve adherence.  - Contact clinical pharmacist at [facility] care if any issues with medication arise.   Asthma: Currently controlled with as needed use of Wixela  - Continue Wixela and Albuterol as needed  Health Maintenance:  - Patient reminded to get  yearly eye exam. He has list of providers and has contact several - awaiting call back to make appt.   He did have hearing tested and is getting hearing aids soon.  Discussed that cabergoline can increase libido / decrease inhibitions. Will let PCP know - currently next appt is planned for August 2024 (made today - per patient he sees PCP every 6 months)    Follow Up Plan: 1 month   Henrene Pastor, PharmD Clinical Pharmacist [facility]  [facility]  03/23/2023 Addendum Per PCP recommendations - patient will lower dose of cabergoline to take 1/2 tablet = 0.25mg  twice weekly. Dr Zola Button also recommended he follow up with endocrinologist. He last saw Dr Everardo All who has retired. Called [facility] to see if another endocrinologist could see him.

## 2023-03-23 NOTE — Patient Instructions (Addendum)
  [name] It was a pleasure speaking with you today.  Below is a summary of our recent visit.    Diabetes: Goal is for A1c to be less than 7.0%  Lab Results  Component Value Date   HGBA1C 8.1 (H) 12/30/2022    - Remember to check total carbohydrates on nutrition labels - 1 serving should be 15 to 20 grams of total carbohydrates  Increase non-starchy vegetables - carrots, green bean, squash, zucchini, tomatoes, onions, peppers, spinach and other green leafy vegetables, cabbage, lettuce, cucumbers, asparagus, okra (not fried), eggplant Limit sugar and processed foods (cakes, cookies, ice cream, crackers and chips) Increase fresh fruit but limit serving sizes 1/2 cup or about the size of tennis or baseball Limit red meat to no more than 1-2 times per week (serving size about the size of your palm) Choose whole grains / lean proteins - whole wheat bread, quinoa, whole grain rice (1/2 cup), fish, chicken, [place] Continue to avoid sugar and calorie containing beverages - soda, sweet tea and juice.  Choose water or unsweetened tea instead.  - continue Rybelsus 3mg  each morning and metformin ER 500mg  daily  -Remember to pick up glucometer and supplies from [facility] - Recommend to check glucose once a day. - Reviewed home blood glucose goals  Fasting blood glucose goal (before meals) = 80 to 130 Blood glucose goal after a meal = less than 180   Blood pressure: Currently at goal of < 130/80  BP Readings from Last 3 Encounters:  12/30/22 110/70  11/25/22 138/74  11/05/22 136/74   - continue losartan 50mg  daily    Hyperlipidemia/ASCVD Risk Reduction: LDL not currently at goal but has been < 55 in past.  - Continue atorvastatin 40mg  daily and ezetimibe 10mg  daily  - Continue to use weekly pill container to improve adherence.  - Contact clinical pharmacist at [facility] care if any issues with medication arise.     As always if you have any questions or concerns especially  regarding medications, please feel free to contact me either at the phone number below or with a MyChart message.   Keep up the good work!  Henrene Pastor, PharmD Clinical Pharmacist [facility] [phone] (direct line)  [phone] (main office number)   Patient verbalizes understanding of instructions and care plan provided today and agrees to view in MyChart. Active MyChart status and patient understanding of how to access instructions and care plan via MyChart confirmed with patient.

## 2023-03-24 ENCOUNTER — Other Ambulatory Visit (INDEPENDENT_AMBULATORY_CARE_PROVIDER_SITE_OTHER): Payer: Medicare HMO | Admitting: Pharmacist

## 2023-03-24 DIAGNOSIS — E119 Type 2 diabetes mellitus without complications: Secondary | ICD-10-CM

## 2023-03-24 NOTE — Progress Notes (Signed)
Chronic Care Management Pharmacy Note  03/24/2023 Name:  Michael Pearson MRN:  [id number] DOB:  08/31/1961  Chief Complaint  Patient presents with   Diabetes     Subjective: Michael Pearson is an 62 y.o. year old male who is a primary patient of Donato Schultz, DO.  The patient was referred to the Chronic Care Management team for assistance with care management needs subsequent to provider initiation of CCM services and plan of care.    Engaged with patient face to face. Patient presented to clinic and was worked in to [employer] and diabetes education.   Patient picked up one touch verio glucometer and test strips yesterday. He has 10 lancets that were provided with new kit but did not get Rx for lancets.    Diabetes: Current medications: Rybelsus 3mg  each morning, metformin ER 500mg  daily Started Rybelsus around 12/2021 - has lost about 20lbs since starting Rybelsus and changing diet.   Previously saw Dr Everardo All for hyperprolactinemia / pituitary adenoma  Current glucose readings: not checking blood glucose. Reports he does not have a working glucometer. Sent in Rx for One Touch Verio to [facility] but patient states he has not received yet.    Patient reports hypoglycemic s/sx including occasional dizziness after breakfast but denies shakiness, sweating.  Patient denies hyperglycemic symptoms including no polyuria, polydipsia, polyphagia, nocturia, neuropathy, blurred vision.  Blood glucose In office was 266 - 1 hour post prandial   Objective:  Lab Results  Component Value Date   HGBA1C 8.1 (H) 12/30/2022    Lab Results  Component Value Date   CREATININE 0.92 12/30/2022   BUN 14 12/30/2022   NA 135 12/30/2022   K 4.4 12/30/2022   CL 100 12/30/2022   CO2 27 12/30/2022    Lab Results  Component Value Date   CHOL 179 12/30/2022   HDL 47.70 12/30/2022   LDLCALC 111 (H) 12/30/2022   TRIG 100.0 12/30/2022   CHOLHDL 4 12/30/2022    Medications Reviewed  Today     Reviewed by Henrene Pastor, RPH-CPP (Pharmacist) on 03/23/23 at 1022  Med List Status: <None>   Medication Order Taking? Sig Documenting Provider Last Dose Status Informant  albuterol (VENTOLIN HFA) 108 (90 Base) MCG/ACT inhaler [id number] Yes Inhale 2 puffs into the lungs every 4 (four) hours as needed for wheezing or shortness of breath. (Rescue inhaler) Donato Schultz, DO Taking Active   aspirin EC 81 MG tablet [id number] Yes Take 81 mg by mouth in the morning. Swallow whole. [provider] Taking Active Self  atorvastatin (LIPITOR) 40 MG tablet [id number] Yes Take 1 tablet (40 mg total) by mouth daily. Seabron Spates R, DO Taking Active   Blood Glucose Monitoring Suppl (ONETOUCH VERIO FLEX SYSTEM) w/Device KIT [id number] No Use to check blood glucose once a day (Dx: type 2 DM E11.65)  Patient not taking: Reported on 03/23/2023   Donato Schultz, DO Not Taking Active   cabergoline (DOSTINEX) 0.5 MG tablet [id number] Yes Take 1 tablet (0.5 mg total) by mouth 2 (two) times a week. Donato Schultz, DO Taking Active   cetirizine (ZYRTEC) 10 MG tablet [id number]  Take 10 mg by mouth daily as needed for allergies.  [provider]  Active Self  diclofenac Sodium (VOLTAREN) 1 % GEL [id number] Yes Apply 4 g topically 4 (four) times daily. Seabron Spates R, DO Taking Active   docusate sodium (COLACE) 100 MG capsule [id number]  Take  100 mg by mouth 2 (two) times daily as needed (constipation.). [provider]  Active Self  esomeprazole (NEXIUM) 40 MG capsule [id number] Yes Take 1 capsule (40 mg total) by mouth 2 (two) times daily before a meal. Zola Button, Grayling Congress, DO Taking Active   ezetimibe (ZETIA) 10 MG tablet [id number] Yes Take 1 tablet (10 mg total) by mouth daily. Zola Button, Myrene Buddy R, DO Taking Active   fluticasone-salmeterol [facility] INHUB) 250-50 MCG/ACT AEPB [id number] Yes Inhale 1 puff into the lungs in the morning and at bedtime.  Replaces Trelegy inhaler as Maintenance inhaler Donato Schultz, DO Taking Active   glucose blood (ONETOUCH VERIO) test strip [id number] No Use to check blood glucose once a day (Dx: type 2 DM E11.65)  Patient not taking: Reported on 03/23/2023   Donato Schultz, DO Not Taking Active   Lancets [facility] [place] PLUS [place]) MISC [id number] No Use to check blood glucose once a day (Dx: type 2 DM E11.65)  Patient not taking: Reported on 03/23/2023   Donato Schultz, DO Not Taking Active   losartan (COZAAR) 50 MG tablet [id number] Yes Take 1 tablet (50 mg total) by mouth daily. Donato Schultz, DO Taking Active   meclizine (ANTIVERT) 25 MG tablet [id number]  Take 1 tablet (25 mg total) by mouth 3 (three) times daily as needed for dizziness. Wanda Plump, MD  Active   metFORMIN (GLUCOPHAGE-XR) 500 MG 24 hr tablet [id number] Yes Take 1 tablet (500 mg total) by mouth daily. Donato Schultz, DO Taking Active   Multiple Vitamin (MULTIVITAMIN WITH MINERALS) TABS tablet [id number] Yes Take 1 tablet by mouth in the morning. [provider] Taking Active Self  Semaglutide (RYBELSUS) 3 MG TABS [id number] Yes Take 1 tablet (3 mg total) by mouth daily. Seabron Spates R, DO Taking Active   silver sulfADIAZINE (SILVADENE) 1 % cream [id number] Yes Apply 1 Application topically daily. Candelaria Stagers, DPM Taking Active   tiZANidine (ZANAFLEX) 4 MG tablet [id number]  Take 1 tablet (4 mg total) by mouth 3 (three) times daily as needed for muscle spasms. Donato Schultz, DO  Active             Medication Assistance: Patient has qualified for Medicaid for 2024. Most medications $0   Assessment/Plan:   Diabetes: Not at A1c goal of < 7.0% - Reviewed goal A1c, goal fasting, and goal 2 hour post prandial glucose - Recommend to continue Rybelsus 3mg , metformin ER 500mg  daily  - Discussed limiting intake of foods that contain sugar, especially sugar containing beverages. Also to  keep serving sizes small for pasta, potatoes, bread and rice.  - Taught patient how to check blood glucose using One Touch glucometer; discussed how to use lancing device.  - Spoke with [facility] to get lancets for One Touch delica filled  - Recommend to check glucose 1-2 times a day. Provided record for tracking blood glucose at home - Reviewed home blood glucose goals  Fasting blood glucose goal (before meals) = 80 to 130 Blood glucose goal after a meal = less than 180  - Called [place] endo and appt was made with Dr Jones Skene for Wed, May 29th at 8:40am - patient is aware.   Follow up in 10 days - if blood glucose still elevated, then will increase either Rybelsus or metformin  Henrene Pastor, PharmD Clinical Pharmacist [facility]

## 2023-03-30 ENCOUNTER — Encounter: Payer: Self-pay | Admitting: "Endocrinology

## 2023-03-30 ENCOUNTER — Ambulatory Visit (INDEPENDENT_AMBULATORY_CARE_PROVIDER_SITE_OTHER): Payer: Medicare HMO | Admitting: "Endocrinology

## 2023-03-30 VITALS — BP 130/70 | HR 76 | Ht 74.0 in | Wt 274.6 lb

## 2023-03-30 DIAGNOSIS — D352 Benign neoplasm of pituitary gland: Secondary | ICD-10-CM

## 2023-03-30 NOTE — Progress Notes (Signed)
Outpatient Endocrinology Note Michael Petroleum, MD    JAYLAND SWARTOUT Jan 09, 1961 [id number]  Referring Provider: Zola Button, Grayling Congress, * Primary Care Provider: Zola Button, Grayling Congress, DO Reason for consultation: Subjective   Assessment & Plan  Diagnoses and all orders for this visit:  Pituitary adenoma (HCC) -     ACTH; Future -     Prolactin; Future -     T4, free; Future -     TSH; Future -     Cortisol; Future -     Insulin-like growth factor; Future -     Growth hormone; Future -     Follicle stimulating hormone; Future -     Luteinizing hormone; Future -     Basic metabolic panel; Future -     Testosterone,Free and Total; Future  On Cabergoline - takes 1 tablet (0.5 mg total) by mouth 2 (two) times a week. Denies any S/E  09/14/2020  MRI HEAD WITHOUT AND WITH CONTRAST COMPARISON:  MRI of the brain June 14, 2019.  IMPRESSION: Stable 7 mm pituitary microadenoma to the right of midline.  Will get baseline 8 am labs-has normal sleep cycle    Return in about 6 weeks (around 05/11/2023) for labs at 8 am tomorrow, visit in 6 weeks.   I have reviewed current medications, nurse's notes, allergies, vital signs, past medical and surgical history, family medical history, and social history for this encounter. Counseled patient on symptoms, examination findings, lab findings, imaging results, treatment decisions and monitoring and prognosis. The patient understood the recommendations and agrees with the treatment plan. All questions regarding treatment plan were fully answered.  Michael [place], MD  03/30/23   History of Present Illness HPI  Michael Pearson is a 62 y.o. year old male who returns for f/u of pituitary microprolactinoma (in 2005, pt was incidentally noted to have a pituitary adenoma; in 2017, elev. prolactin was noted; f/u MRI in 2020 showed adenoma was down to 7 mm 2021 showed no change; h/o near-syncope limits cabergoltine dosage; other pituitary functions are  normal).    Head aches are not as bad as usual Feels nauseous off and on everyday but no vomiting Had chronic lightheadedness if gets up fast-sometimes  Has been trying to lose weight, lost 15 lbs in 3 mo Reports gas and umbilical abdominal pain   Feels fatigue, and intermittent constipation Denies heat intolerance  Denies breast discharge  On Cabergoline - takes 1 tablet (0.5 mg total) by mouth 2 (two) times a week. Denies any S/E  09/14/2020  MRI HEAD WITHOUT AND WITH CONTRAST COMPARISON:  MRI of the brain June 14, 2019.   IMPRESSION: Stable 7 mm pituitary microadenoma to the right of midline.   Physical Exam  BP 130/70   Pulse 76   Ht 6\' 2"  (1.88 m)   Wt 274 lb 9.6 oz (124.6 kg)   SpO2 92%   BMI 35.26 kg/m    Constitutional: well developed, well nourished Head: normocephalic, atraumatic Eyes: sclera anicteric, no redness Neck: supple Lungs: normal respiratory effort Neurology: alert and oriented Skin: dry, no appreciable rashes Musculoskeletal: no appreciable defects Psychiatric: normal mood and affect   Current Medications Patient's Medications  New Prescriptions   No medications on file  Previous Medications   ALBUTEROL (VENTOLIN HFA) 108 (90 BASE) MCG/ACT INHALER    Inhale 2 puffs into the lungs every 4 (four) hours as needed for wheezing or shortness of breath. (Rescue inhaler)   ASPIRIN EC 81  MG TABLET    Take 81 mg by mouth in the morning. Swallow whole.   ATORVASTATIN (LIPITOR) 40 MG TABLET    Take 1 tablet (40 mg total) by mouth daily.   BLOOD GLUCOSE MONITORING SUPPL (ONETOUCH VERIO FLEX SYSTEM) W/DEVICE KIT    Use to check blood glucose once a day (Dx: type 2 DM E11.65)   CABERGOLINE (DOSTINEX) 0.5 MG TABLET    Take 1 tablet (0.5 mg total) by mouth 2 (two) times a week.   CETIRIZINE (ZYRTEC) 10 MG TABLET    Take 10 mg by mouth daily as needed for allergies.    DICLOFENAC SODIUM (VOLTAREN) 1 % GEL    Apply 4 g topically 4 (four) times daily.    DOCUSATE SODIUM (COLACE) 100 MG CAPSULE    Take 100 mg by mouth 2 (two) times daily as needed (constipation.).   ESOMEPRAZOLE (NEXIUM) 40 MG CAPSULE    Take 1 capsule (40 mg total) by mouth 2 (two) times daily before a meal.   EZETIMIBE (ZETIA) 10 MG TABLET    Take 1 tablet (10 mg total) by mouth daily.   FLUTICASONE-SALMETEROL (WIXELA INHUB) 250-50 MCG/ACT AEPB    Inhale 1 puff into the lungs in the morning and at bedtime. Replaces Trelegy inhaler as Maintenance inhaler   GLUCOSE BLOOD (ONETOUCH VERIO) TEST STRIP    Use to check blood glucose once a day (Dx: type 2 DM E11.65)   LANCETS (ONETOUCH DELICA PLUS LANCET33G) MISC    Use to check blood glucose once a day (Dx: type 2 DM E11.65)   LOSARTAN (COZAAR) 50 MG TABLET    Take 1 tablet (50 mg total) by mouth daily.   MECLIZINE (ANTIVERT) 25 MG TABLET    Take 1 tablet (25 mg total) by mouth 3 (three) times daily as needed for dizziness.   METFORMIN (GLUCOPHAGE-XR) 500 MG 24 HR TABLET    Take 1 tablet (500 mg total) by mouth daily.   MULTIPLE VITAMIN (MULTIVITAMIN WITH MINERALS) TABS TABLET    Take 1 tablet by mouth in the morning.   SEMAGLUTIDE (RYBELSUS) 3 MG TABS    Take 1 tablet (3 mg total) by mouth daily.   SILVER SULFADIAZINE (SILVADENE) 1 % CREAM    Apply 1 Application topically daily.   TIZANIDINE (ZANAFLEX) 4 MG TABLET    Take 1 tablet (4 mg total) by mouth 3 (three) times daily as needed for muscle spasms.  Modified Medications   No medications on file  Discontinued Medications   No medications on file    Allergies No Known Allergies  Past Medical History Past Medical History:  Diagnosis Date   Allergy    Arthritis    Asthma    CAD (coronary artery disease)    NSTEMI 7/12:  Cardiac cath on 7/17: pLAD occluded, Dx 30-40%, pRCA 30-40%, mRCA 30-40%.  Proximal LAD was treated with a BMS.  Echo 7/19:  EF 60-65%, mild LVH.    ETT-Myoview 6/14:  Inf thinning, no ischemia, EF 64%, normal study // Myoview 04/2020: EF 57, normal perfusion;  Low Risk     Cognitive communication deficit    Colitis    Diverticulosis    Duodenitis    May 2012 with heme positive stools at this time. Endorses only rare streaking of blood now.   External hemorrhoids    GERD (gastroesophageal reflux disease)    Heart murmur    as a child per the pt   Hiatal hernia    Hx  of hemorrhoids    Hyperlipidemia    Hypertension    Kidney stones    Microadenoma    MRI in 2008 demonstrating 4.6 mm area of pituitary gland    Migraines    Has been evaluated multiple times in past for chronic headache in which pt has had occasional nose bleeds and bloodshot eyes. This lasted for 4-5 months. CT of the head was negative in May 2012.   Myocardial infarction (HCC) 05/17/2011   Pituitary tumor    RBBB (right bundle branch block)    Noted on EKG in 2008   Ulcer     Past Surgical History Past Surgical History:  Procedure Laterality Date   BIOPSY  10/15/2021   Procedure: BIOPSY;  Surgeon: Beverley Fiedler, MD;  Location: [facility];  Service: Gastroenterology;;   COLONOSCOPY     egd  03/05/2011   Dr. Elnoria Howard   ESOPHAGOGASTRODUODENOSCOPY (EGD) WITH PROPOFOL N/A 10/15/2021   Procedure: ESOPHAGOGASTRODUODENOSCOPY (EGD) WITH PROPOFOL;  Surgeon: Beverley Fiedler, MD;  Location: [facility];  Service: Gastroenterology;  Laterality: N/A;  possible dilation   FLEXIBLE SIGMOIDOSCOPY  03/05/2011   Dr. Elnoria Howard   HAMMER TOE SURGERY     Heart stent     HERNIA REPAIR     In 20's   MALONEY DILATION  10/15/2021   Procedure: MALONEY DILATION;  Surgeon: Beverley Fiedler, MD;  Location: [facility];  Service: Gastroenterology;;  52cm    ORIF TIBIA PLATEAU Left 08/04/2018   Procedure: OPEN REDUCTION INTERNAL FIXATION (ORIF) TIBIAL PLATEAU;  Surgeon: Myrene Galas, MD;  Location: [facility] OR;  Service: Orthopedics;  Laterality: Left;   SIGMOIDOSCOPY     TOOTH EXTRACTION     UPPER GASTROINTESTINAL ENDOSCOPY      Family History family history includes Cancer in his father; Colon cancer in his  maternal aunt; Colon polyps in his maternal aunt and paternal grandmother; Coronary artery disease in his brother; Heart attack (age of onset: 37) in his father; Hypertension in his brother; Obesity in his brother; Skin cancer in his father; Stroke in his mother.  Social History Social History   Socioeconomic History   Marital status: Single    Spouse name: Not on file   Number of children: Not on file   Years of education: Not on file   Highest education level: Not on file  Occupational History   Occupation: TEFL teacher-- cleans 3 buildings    Comment: Fairly physical job    Employer: [employer]  Tobacco Use   Smoking status: Never   Smokeless tobacco: Never  Vaping Use   Vaping Use: Never used  Substance and Sexual Activity   Alcohol use: No   Drug use: No   Sexual activity: Not Currently  Other Topics Concern   Not on file  Social History Narrative   Lives with brother and mother   Mother is quite sick, he and his brother take turns taking care of her   No children   Social Determinants of Health   Financial Resource Strain: Low Risk  (01/25/2023)   Overall Financial Resource Strain (CARDIA)    Difficulty of Paying Living Expenses: Not very hard  Food Insecurity: No Food Insecurity (01/25/2023)   Hunger Vital Sign    Worried About Running Out of Food in the Last Year: Never true    Ran Out of Food in the Last Year: Never true  Transportation Needs: No Transportation Needs (01/25/2023)   PRAPARE - Transportation    Lack of  Transportation (Medical): No    Lack of Transportation (Non-Medical): No  Physical Activity: Sufficiently Active (01/25/2023)   Exercise Vital Sign    Days of Exercise per Week: 6 days    Minutes of Exercise per Session: 30 min  Stress: No Stress Concern Present (07/09/2021)   [employer] of Occupational Health - Occupational Stress Questionnaire    Feeling of Stress : Not at all  Social Connections: Socially Isolated (01/25/2023)    Social Connection and Isolation Panel [NHANES]    Frequency of Communication with Friends and Family: More than three times a week    Frequency of Social Gatherings with Friends and Family: More than three times a week    Attends Religious Services: Never    Database administrator or Organizations: No    Attends Banker Meetings: Never    Marital Status: Never married  Intimate Partner Violence: Not At Risk (07/09/2021)   Humiliation, Afraid, Rape, and Kick questionnaire    Fear of Current or Ex-Partner: No    Emotionally Abused: No    Physically Abused: No    Sexually Abused: No    Lab Results  Component Value Date   CHOL 179 12/30/2022   Lab Results  Component Value Date   HDL 47.70 12/30/2022   Lab Results  Component Value Date   LDLCALC 111 (H) 12/30/2022   Lab Results  Component Value Date   TRIG 100.0 12/30/2022   Lab Results  Component Value Date   CHOLHDL 4 12/30/2022   Lab Results  Component Value Date   CREATININE 0.92 12/30/2022   Lab Results  Component Value Date   GFR 89.47 12/30/2022      Component Value Date/Time   NA 135 12/30/2022 1426   K 4.4 12/30/2022 1426   CL 100 12/30/2022 1426   CO2 27 12/30/2022 1426   GLUCOSE 110 (H) 12/30/2022 1426   BUN 14 12/30/2022 1426   CREATININE 0.92 12/30/2022 1426   CALCIUM 9.8 12/30/2022 1426   CALCIUM 8.6 (L) 08/05/2018 0514   PROT 6.8 12/30/2022 1426   ALBUMIN 4.0 12/30/2022 1426   AST 27 12/30/2022 1426   ALT 45 12/30/2022 1426   ALKPHOS 55 12/30/2022 1426   BILITOT 0.6 12/30/2022 1426   GFRNONAA >60 11/02/2022 1545   GFRAA >60 08/05/2018 0514      Latest Ref Rng & Units 12/30/2022    2:26 PM 11/02/2022    3:45 PM 06/25/2022    9:37 AM  BMP  Glucose 70 - 99 mg/dL 161  096  045   BUN 6 - 23 mg/dL 14  12  13    Creatinine 0.40 - 1.50 mg/dL 4.09  8.11  9.14   Sodium 135 - 145 mEq/L 135  137  139   Potassium 3.5 - 5.1 mEq/L 4.4  4.4  4.3   Chloride 96 - 112 mEq/L 100  102  100   CO2  19 - 32 mEq/L 27  28  29    Calcium 8.4 - 10.5 mg/dL 9.8  9.3  78.2        Component Value Date/Time   WBC 8.0 12/30/2022 1426   RBC 4.53 12/30/2022 1426   HGB 14.5 12/30/2022 1426   HCT 43.6 12/30/2022 1426   PLT 155.0 12/30/2022 1426   MCV 96.3 12/30/2022 1426   MCH 31.5 11/02/2022 1545   MCHC 33.4 12/30/2022 1426   RDW 14.0 12/30/2022 1426   LYMPHSABS 0.6 (L) 12/30/2022 1426   MONOABS 0.5  12/30/2022 1426   EOSABS 0.0 12/30/2022 1426   BASOSABS 0.0 12/30/2022 1426   Lab Results  Component Value Date   TSH 0.91 12/30/2022   TSH 1.64 06/25/2022   TSH 1.87 07/29/2021   FREET4 0.74 07/29/2021   FREET4 0.76 07/16/2020   FREET4 0.81 01/02/2020         Parts of this note may have been dictated using voice recognition software. There may be variances in spelling and vocabulary which are unintentional. Not all errors are proofread. Please notify the Thereasa Parkin if any discrepancies are noted or if the meaning of any statement is not clear.

## 2023-03-31 ENCOUNTER — Other Ambulatory Visit (INDEPENDENT_AMBULATORY_CARE_PROVIDER_SITE_OTHER): Payer: Medicare HMO

## 2023-03-31 DIAGNOSIS — D352 Benign neoplasm of pituitary gland: Secondary | ICD-10-CM | POA: Diagnosis not present

## 2023-03-31 LAB — BASIC METABOLIC PANEL
BUN: 17 mg/dL (ref 6–23)
CO2: 27 mEq/L (ref 19–32)
Calcium: 9.8 mg/dL (ref 8.4–10.5)
Chloride: 102 mEq/L (ref 96–112)
Creatinine, Ser: 0.83 mg/dL (ref 0.40–1.50)
GFR: 93.97 mL/min (ref >60.00)
Glucose, Bld: 148 mg/dL — ABNORMAL HIGH (ref 70–99)
Potassium: 4.1 mEq/L (ref 3.5–5.1)
Sodium: 140 mEq/L (ref 135–145)

## 2023-03-31 LAB — FOLLICLE STIMULATING HORMONE: FSH: 9.3 m[IU]/mL (ref 1.4–18.1)

## 2023-03-31 LAB — CORTISOL: Cortisol, Plasma: 7 ug/dL

## 2023-03-31 LAB — T4, FREE: Free T4: 0.8 ng/dL (ref 0.60–1.60)

## 2023-03-31 LAB — TSH: TSH: 1.75 u[IU]/mL (ref 0.35–5.50)

## 2023-03-31 LAB — LUTEINIZING HORMONE: LH: 5.15 m[IU]/mL (ref 1.50–9.30)

## 2023-04-01 DIAGNOSIS — E785 Hyperlipidemia, unspecified: Secondary | ICD-10-CM

## 2023-04-01 DIAGNOSIS — J4520 Mild intermittent asthma, uncomplicated: Secondary | ICD-10-CM

## 2023-04-01 DIAGNOSIS — I10 Essential (primary) hypertension: Secondary | ICD-10-CM

## 2023-04-01 DIAGNOSIS — I2510 Atherosclerotic heart disease of native coronary artery without angina pectoris: Secondary | ICD-10-CM

## 2023-04-01 DIAGNOSIS — E1165 Type 2 diabetes mellitus with hyperglycemia: Secondary | ICD-10-CM

## 2023-04-01 LAB — TESTOSTERONE,FREE AND TOTAL

## 2023-04-04 ENCOUNTER — Ambulatory Visit (INDEPENDENT_AMBULATORY_CARE_PROVIDER_SITE_OTHER): Payer: Medicare HMO | Admitting: Pharmacist

## 2023-04-04 DIAGNOSIS — J4520 Mild intermittent asthma, uncomplicated: Secondary | ICD-10-CM

## 2023-04-04 DIAGNOSIS — E1165 Type 2 diabetes mellitus with hyperglycemia: Secondary | ICD-10-CM

## 2023-04-04 DIAGNOSIS — E785 Hyperlipidemia, unspecified: Secondary | ICD-10-CM

## 2023-04-04 LAB — PROLACTIN: Prolactin: 6.3 ng/mL (ref 2.0–18.0)

## 2023-04-04 MED ORDER — RYBELSUS 3 MG PO TABS: 6.0000 mg | ORAL_TABLET | Freq: Every day | ORAL | 3 refills | Status: DC

## 2023-04-04 NOTE — Progress Notes (Signed)
Chronic Care Management Pharmacy Note  04/04/2023 Name:  Michael Pearson MRN:  [id number] DOB:  January 14, 1961  Chief Complaint  Patient presents with   Medication Management   Diabetes     Subjective: Michael Pearson is an 62 y.o. year old male who is a primary patient of Donato Schultz, DO.  The patient was referred to the Chronic Care Management team for assistance with care management needs subsequent to provider initiation of CCM services and plan of care.    Engaged with patient by telephone.   For Chronic Care Management follow up   Diabetes: Current medications: Rybelsus 3mg  each morning, metformin ER 500mg  daily Started Rybelsus around 12/2021  A1c prior to starting Rybelsus was 8.5%; Current A1c is 8.1% Weight prior to starting Rybelsus was 293 lbs; Current weight = 275lbs Total weight loss 18lbs  Previously saw Dr Everardo All for hyperprolactinemia / pituitary adenoma.  Had first visit with Dr Melvyn Novas on 03/30/2023 at [facility] f/u of pituitary adenoma. Several labs checked. F/U 6 weeks.   Current glucose readings:  Fasting: 210, 229, 210, 190, 161,  Post prandial:  266, 282, 210, 192, 185  Blood glucose at endo office when BMET was checked 03/31/2023 was 148 (fasting)   Patient reports hypoglycemic s/sx including occasional dizziness and fatigue sometimes but denies shakiness, sweating.  Patient denies hyperglycemic symptoms including no polyuria, polydipsia, polyphagia, nocturia, neuropathy, blurred vision.  Hypertension:  Current therapy - losartan 50mg  daily - LR was 01/10/2023 for 90 days Not checking blood pressure at home.   BP Readings from Last 3 Encounters:  03/30/23 130/70  12/30/22 110/70  11/25/22 138/74    Objective:  Lab Results  Component Value Date   HGBA1C 8.1 (H) 12/30/2022    Lab Results  Component Value Date   CREATININE 0.83 03/31/2023   BUN 17 03/31/2023   NA 140 03/31/2023   K 4.1 03/31/2023   CL 102 03/31/2023   CO2  27 03/31/2023    Lab Results  Component Value Date   CHOL 179 12/30/2022   HDL 47.70 12/30/2022   LDLCALC 111 (H) 12/30/2022   TRIG 100.0 12/30/2022   CHOLHDL 4 12/30/2022    Medications Reviewed Today     Reviewed by Altamese Dickey, MD (Physician) on 03/30/23 at 0900  Med List Status: <None>   Medication Order Taking? Sig Documenting Provider Last Dose Status Informant  albuterol (VENTOLIN HFA) 108 (90 Base) MCG/ACT inhaler [id number] Yes Inhale 2 puffs into the lungs every 4 (four) hours as needed for wheezing or shortness of breath. (Rescue inhaler) Donato Schultz, DO Taking Active   aspirin EC 81 MG tablet [id number] Yes Take 81 mg by mouth in the morning. Swallow whole. [provider] Taking Active Self  atorvastatin (LIPITOR) 40 MG tablet [id number] Yes Take 1 tablet (40 mg total) by mouth daily. Donato Schultz, DO Taking Active   Blood Glucose Monitoring Suppl (ONETOUCH VERIO FLEX SYSTEM) w/Device KIT [id number] Yes Use to check blood glucose once a day (Dx: type 2 DM E11.65) Zola Button, Grayling Congress, DO Taking Active   cabergoline (DOSTINEX) 0.5 MG tablet [id number] Yes Take 1 tablet (0.5 mg total) by mouth 2 (two) times a week. Donato Schultz, DO Taking Active   cetirizine (ZYRTEC) 10 MG tablet [id number] Yes Take 10 mg by mouth daily as needed for allergies.  [provider] Taking Active Self  diclofenac Sodium (VOLTAREN) 1 % GEL [id number] Yes Apply 4  g topically 4 (four) times daily. Donato Schultz, DO Taking Active   docusate sodium (COLACE) 100 MG capsule [id number] Yes Take 100 mg by mouth 2 (two) times daily as needed (constipation.). [provider] Taking Active Self  esomeprazole (NEXIUM) 40 MG capsule [id number] Yes Take 1 capsule (40 mg total) by mouth 2 (two) times daily before a meal. Zola Button, Grayling Congress, DO Taking Active   ezetimibe (ZETIA) 10 MG tablet [id number] Yes Take 1 tablet (10 mg total) by mouth daily. Zola Button, Myrene Buddy R, DO Taking Active   fluticasone-salmeterol [facility] INHUB) 250-50 MCG/ACT AEPB [id number] Yes Inhale 1 puff into the lungs in the morning and at bedtime. Replaces Trelegy inhaler as Maintenance inhaler Donato Schultz, DO Taking Active   glucose blood (ONETOUCH VERIO) test strip [id number] Yes Use to check blood glucose once a day (Dx: type 2 DM E11.65) Donato Schultz, DO Taking Active   Lancets [facility] PLUS [place]) MISC [id number] Yes Use to check blood glucose once a day (Dx: type 2 DM E11.65) Zola Button, Grayling Congress, DO Taking Active   losartan (COZAAR) 50 MG tablet [id number] Yes Take 1 tablet (50 mg total) by mouth daily. Donato Schultz, DO Taking Active   meclizine (ANTIVERT) 25 MG tablet [id number] Yes Take 1 tablet (25 mg total) by mouth 3 (three) times daily as needed for dizziness. Wanda Plump, MD Taking Active   metFORMIN (GLUCOPHAGE-XR) 500 MG 24 hr tablet [id number] Yes Take 1 tablet (500 mg total) by mouth daily. Donato Schultz, DO Taking Active   Multiple Vitamin (MULTIVITAMIN WITH MINERALS) TABS tablet [id number] Yes Take 1 tablet by mouth in the morning. [provider] Taking Active Self  Semaglutide (RYBELSUS) 3 MG TABS [id number] Yes Take 1 tablet (3 mg total) by mouth daily. Seabron Spates R, DO Taking Active   silver sulfADIAZINE (SILVADENE) 1 % cream [id number] Yes Apply 1 Application topically daily. Candelaria Stagers, DPM Taking Active   tiZANidine (ZANAFLEX) 4 MG tablet [id number] Yes Take 1 tablet (4 mg total) by mouth 3 (three) times daily as needed for muscle spasms. Donato Schultz, DO Taking Active             Medication Assistance: Patient has qualified for Medicaid for 2024. Most medications $0   Assessment/Plan:   Diabetes: Not at A1c goal of < 7.0% - Reviewed goal A1c, goal fasting, and goal 2 hour post prandial glucose - Recommend to metformin ER 500mg  daily  - Increase Rybelsus to take 2 tablets =  6mg  once a day. Reminded to take 30 minutes prior to breakfast with a sip of water.  - Discussed limiting intake of foods that contain sugar, especially sugar containing beverages. Also to keep serving sizes small for pasta, potatoes, bread and rice.  - Continue to check blood glucose once daily - varying time of day - Reviewed home blood glucose goals  Fasting blood glucose goal (before meals) = 80 to 130 Blood glucose goal after a meal = less than 180   Hypertension:  Continue losartan - called [facility] at patient request to ask for refill.   Also asked for refill of Wixela inhaler.    Patient was interested in electric scooted due to decrease mobility. F2F appt set up with PCP tomorrow 04/05/2023  Follow up in 10 to 14 days  Henrene Pastor, PharmD Clinical Pharmacist [facility]

## 2023-04-04 NOTE — Patient Instructions (Signed)
Mr. Burno It was a pleasure speaking with you today.  Below is a summary of our appointment  Increase Rybelsus to take 2 tablets of the 3mg  = 6mg  total. We will eventually change to 7mg  dose. Remember to take before breakfast and take with just a sip of water.   Continue to check blood glucose once a day. Below are our blood glucose goals for you.   Fasting blood glucose goal (before meals) = 80 to 130 Blood glucose goal after a meal = less than 180    I have requested Wixela and losartan refills from [facility]   As always if you have any questions or concerns especially regarding medications, please feel free to contact me either at the phone number below or with a MyChart message.   Keep up the good work!  Henrene Pastor, PharmD Clinical Pharmacist [facility] [phone] (direct line)  [phone] (main office number)   Patient verbalizes understanding of instructions and care plan provided today and agrees to view in MyChart. Active MyChart status and patient understanding of how to access instructions and care plan via MyChart confirmed with patient.

## 2023-04-05 ENCOUNTER — Ambulatory Visit (INDEPENDENT_AMBULATORY_CARE_PROVIDER_SITE_OTHER): Payer: Medicare HMO | Admitting: Family Medicine

## 2023-04-05 ENCOUNTER — Encounter: Payer: Self-pay | Admitting: Family Medicine

## 2023-04-05 VITALS — BP 120/78 | HR 73 | Temp 98.0°F | Resp 18 | Ht 74.0 in | Wt 273.0 lb

## 2023-04-05 DIAGNOSIS — I10 Essential (primary) hypertension: Secondary | ICD-10-CM

## 2023-04-05 DIAGNOSIS — E1165 Type 2 diabetes mellitus with hyperglycemia: Secondary | ICD-10-CM

## 2023-04-05 DIAGNOSIS — E221 Hyperprolactinemia: Secondary | ICD-10-CM

## 2023-04-05 DIAGNOSIS — E785 Hyperlipidemia, unspecified: Secondary | ICD-10-CM

## 2023-04-05 DIAGNOSIS — Z7984 Long term (current) use of oral hypoglycemic drugs: Secondary | ICD-10-CM | POA: Diagnosis not present

## 2023-04-05 LAB — GROWTH HORMONE: Growth Hormone: 0.2 ng/mL (ref <=7.1)

## 2023-04-05 LAB — INSULIN-LIKE GROWTH FACTOR
IGF-I, LC/MS: 78 ng/mL (ref 41–279)
Z-Score (Male): -0.9 SD (ref ?–2.0)

## 2023-04-05 LAB — ACTH: C206 ACTH: 10 pg/mL (ref 6–50)

## 2023-04-05 NOTE — Progress Notes (Signed)
Established Patient Office Visit  Subjective   Patient ID: Michael Pearson, male    DOB: 07/03/1961  Age: 62 y.o. MRN: [id number]  Chief Complaint  Patient presents with   Face to Face for electric scooter    Pt says his walking is limited and he is needing a scooter now. Pt says he had spoken to a medical supply company in [place] (unsure of the name).    HPI Discussed the use of AI scribe software for clinical note transcription with the patient, who gave verbal consent to proceed.  History of Present Illness   The patient, with a history of diabetes and arthritis, presents with increasing mobility issues. They report difficulty walking across a room without needing to sit down multiple times due to leg weakness. They also experience cramping and locking in their arms when raised above their head, and occasional numbness in both arms and legs. They have not fallen or injured themselves, but have had close calls where they needed to grab something to prevent a fall.  The patient's blood sugar levels have been consistently high, ranging from 190 to 261. They have been checking their blood sugar regularly. They also report a recent foot procedure where more tissue had to be removed from the bottom of their foot. The foot is healing slowly and causes significant pain when walking, described as stepping on a thumbtack.  The patient is seeking a power wheelchair to improve mobility, particularly for tasks outside the home such as grocery shopping and walking their dogs. They currently move from chair to chair within their home to manage mobility. They have previously used a manual wheelchair and scooter, but found it difficult due to the size and their arm numbness.      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) 12/30/2022   Blood clotting disorder (HCC) 12/30/2022   Primary osteoarthritis of both knees 06/25/2022   Suprapubic  abdominal pain 12/25/2021   Dysphagia    Gastritis without bleeding    Hyperglycemia 07/29/2021   OSA (obstructive sleep apnea) 05/20/2021   Chronic pain of both knees 12/09/2020   Dizziness 07/31/2020   Cognitive communication deficit    Hypertension    CAD (coronary artery disease)    Tibial fracture 08/04/2018   Closed bicondylar fracture of left tibial plateau 08/04/2018   Fracture 08/03/2018   Preventative health care 11/24/2017   Hyperprolactinemia (HCC) 07/28/2017   Pituitary adenoma (HCC) 07/28/2017   Chest pain 05/19/2016   Laceration of right index finger w/o foreign body w/o damage to nail 01/14/2016   Visit for suture removal 01/09/2016   GERD (gastroesophageal reflux disease) 12/04/2015   Headache 08/14/2015   History of diverticulitis of colon 03/30/2013   Acute diverticulitis 02/03/2013   Essential hypertension 02/03/2013   Chronic allergic rhinitis 11/03/2012   Heme positive stool 11/03/2012   Foot injury 05/15/2012   Upper airway cough syndrome p batter acid exp vs asthma  07/22/2011   Migraines 06/17/2011   Mild intermittent asthma 06/17/2011   Non-ST elevation myocardial infarction (NSTEMI) 06/07/2011   Atherosclerosis of native coronary artery of native heart with angina pectoris (HCC) 06/07/2011   Hyperlipidemia 06/07/2011   Past Medical History:  Diagnosis Date   Allergy    Arthritis    Asthma    CAD (coronary artery disease)    NSTEMI 7/12:  Cardiac cath on 7/17: pLAD occluded, Dx 30-40%, pRCA 30-40%, mRCA 30-40%.  Proximal LAD  was treated with a BMS.  Echo 7/19:  EF 60-65%, mild LVH.    ETT-Myoview 6/14:  Inf thinning, no ischemia, EF 64%, normal study // Myoview 04/2020: EF 57, normal perfusion; Low Risk     Cognitive communication deficit    Colitis    Diverticulosis    Duodenitis    May 2012 with heme positive stools at this time. Endorses only rare streaking of blood now.   External hemorrhoids    GERD (gastroesophageal reflux disease)     Heart murmur    as a child per the pt   Hiatal hernia    Hx of hemorrhoids    Hyperlipidemia    Hypertension    Kidney stones    Microadenoma    MRI in 2008 demonstrating 4.6 mm area of pituitary gland    Migraines    Has been evaluated multiple times in past for chronic headache in which pt has had occasional nose bleeds and bloodshot eyes. This lasted for 4-5 months. CT of the head was negative in May 2012.   Myocardial infarction (HCC) 05/17/2011   Pituitary tumor    RBBB (right bundle branch block)    Noted on EKG in 2008   Ulcer    Past Surgical History:  Procedure Laterality Date   BIOPSY  10/15/2021   Procedure: BIOPSY;  Surgeon: Beverley Fiedler, MD;  Location: [facility];  Service: Gastroenterology;;   COLONOSCOPY     egd  03/05/2011   Dr. Elnoria Howard   ESOPHAGOGASTRODUODENOSCOPY (EGD) WITH PROPOFOL N/A 10/15/2021   Procedure: ESOPHAGOGASTRODUODENOSCOPY (EGD) WITH PROPOFOL;  Surgeon: Beverley Fiedler, MD;  Location: [facility];  Service: Gastroenterology;  Laterality: N/A;  possible dilation   FLEXIBLE SIGMOIDOSCOPY  03/05/2011   Dr. Elnoria Howard   HAMMER TOE SURGERY     Heart stent     HERNIA REPAIR     In 20's   MALONEY DILATION  10/15/2021   Procedure: MALONEY DILATION;  Surgeon: Beverley Fiedler, MD;  Location: [facility];  Service: Gastroenterology;;  52cm    ORIF TIBIA PLATEAU Left 08/04/2018   Procedure: OPEN REDUCTION INTERNAL FIXATION (ORIF) TIBIAL PLATEAU;  Surgeon: Myrene Galas, MD;  Location: [facility];  Service: Orthopedics;  Laterality: Left;   SIGMOIDOSCOPY     TOOTH EXTRACTION     UPPER GASTROINTESTINAL ENDOSCOPY     Social History   Tobacco Use   Smoking status: Never   Smokeless tobacco: Never  Vaping Use   Vaping Use: Never used  Substance Use Topics   Alcohol use: No   Drug use: No   Social History   Socioeconomic History   Marital status: Single    Spouse name: Not on file   Number of children: Not on file   Years of education: Not on file   Highest  education level: Not on file  Occupational History   Occupation: TEFL teacher-- cleans 3 buildings    Comment: Fairly physical job    Employer: [employer]  Tobacco Use   Smoking status: Never   Smokeless tobacco: Never  Vaping Use   Vaping Use: Never used  Substance and Sexual Activity   Alcohol use: No   Drug use: No   Sexual activity: Not Currently  Other Topics Concern   Not on file  Social History Narrative   Lives with brother and mother   Mother is quite sick, he and his brother take turns taking care of her   No children   Social Determinants of  Health   Financial Resource Strain: Low Risk  (01/25/2023)   Overall Financial Resource Strain (CARDIA)    Difficulty of Paying Living Expenses: Not very hard  Food Insecurity: No Food Insecurity (01/25/2023)   Hunger Vital Sign    Worried About Running Out of Food in the Last Year: Never true    Ran Out of Food in the Last Year: Never true  Transportation Needs: No Transportation Needs (01/25/2023)   PRAPARE - Administrator, Civil Service (Medical): No    Lack of Transportation (Non-Medical): No  Physical Activity: Sufficiently Active (01/25/2023)   Exercise Vital Sign    Days of Exercise per Week: 6 days    Minutes of Exercise per Session: 30 min  Stress: No Stress Concern Present (07/09/2021)   [employer] of Occupational Health - Occupational Stress Questionnaire    Feeling of Stress : Not at all  Social Connections: Socially Isolated (01/25/2023)   Social Connection and Isolation Panel [NHANES]    Frequency of Communication with Friends and Family: More than three times a week    Frequency of Social Gatherings with Friends and Family: More than three times a week    Attends Religious Services: Never    Database administrator or Organizations: No    Attends Banker Meetings: Never    Marital Status: Never married  Intimate Partner Violence: Not At Risk (07/09/2021)   Humiliation,  Afraid, Rape, and Kick questionnaire    Fear of Current or Ex-Partner: No    Emotionally Abused: No    Physically Abused: No    Sexually Abused: No   Family Status  Relation Name Status   Mother  Deceased       51 yoa   Father  Deceased at age 64   Brother  Alive   Sister  Deceased at age birth   Mat Aunt  Alive   PGM  (Not Specified)   Neg Hx  (Not Specified)   Family History  Problem Relation Age of Onset   Stroke Mother    Heart attack Father 15       MI   Skin cancer Father    Cancer Father        ? type "rare"   Obesity Brother    Coronary artery disease Brother        Possible, pt not sure of specifics   Hypertension Brother    Colon polyps Maternal Aunt    Colon cancer Maternal Aunt    Colon polyps Paternal Grandmother    Other Neg Hx    Esophageal cancer Neg Hx    Rectal cancer Neg Hx    Stomach cancer Neg Hx    No Known Allergies    Review of Systems  Constitutional:  Negative for fever and malaise/fatigue.  HENT:  Negative for congestion.   Eyes:  Negative for blurred vision.  Respiratory:  Negative for shortness of breath.   Cardiovascular:  Negative for chest pain, palpitations and leg swelling.  Gastrointestinal:  Negative for abdominal pain, blood in stool and nausea.  Genitourinary:  Negative for dysuria and frequency.  Musculoskeletal:  Negative for falls.  Skin:  Negative for rash.  Neurological:  Negative for dizziness, loss of consciousness and headaches.  Endo/Heme/Allergies:  Negative for environmental allergies.  Psychiatric/Behavioral:  Negative for depression. The patient is not nervous/anxious.       Objective:     BP 120/78 (BP Location: Left Arm, Patient Position: Sitting, Cuff  Size: Large)   Pulse 73   Temp 98 F (36.7 C) (Oral)   Resp 18   Ht 6\' 2"  (1.88 m)   Wt 273 lb (123.8 kg)   SpO2 95%   BMI 35.05 kg/m  BP Readings from Last 3 Encounters:  04/05/23 120/78  03/30/23 130/70  12/30/22 110/70   Wt Readings from  Last 3 Encounters:  04/05/23 273 lb (123.8 kg)  03/30/23 274 lb 9.6 oz (124.6 kg)  11/02/22 280 lb (127 kg)   SpO2 Readings from Last 3 Encounters:  04/05/23 95%  03/30/23 92%  12/30/22 94%      Physical Exam Vitals and nursing note reviewed.  Constitutional:      General: He is not in acute distress.    Appearance: Normal appearance. He is well-developed. He is not ill-appearing.  HENT:     Head: Normocephalic and atraumatic.     Nose: Nose normal. No congestion.  Eyes:     Extraocular Movements: Extraocular movements intact.     Conjunctiva/sclera: Conjunctivae normal.     Pupils: Pupils are equal, round, and reactive to light.  Neck:     Thyroid: No thyromegaly.  Cardiovascular:     Rate and Rhythm: Normal rate and regular rhythm.     Heart sounds: No murmur heard. Pulmonary:     Effort: Pulmonary effort is normal. No respiratory distress.     Breath sounds: Normal breath sounds. No wheezing or rales.  Chest:     Chest wall: No tenderness.  Abdominal:     General: Abdomen is flat.     Palpations: Abdomen is soft.     Tenderness: There is no abdominal tenderness. There is no guarding or rebound.  Musculoskeletal:        General: No swelling or tenderness. Normal range of motion.     Cervical back: Normal range of motion and neck supple.     Right hip: Normal range of motion. Normal strength.     Left hip: Normal range of motion. Normal strength.     Right foot: Bony tenderness present. No swelling.     Left foot: Bony tenderness present. No swelling.  Skin:    General: Skin is warm and dry.  Neurological:     General: No focal deficit present.     Mental Status: He is alert and oriented to person, place, and time.  Psychiatric:        Behavior: Behavior normal.        Thought Content: Thought content normal.        Judgment: Judgment normal.      No results found for any visits on 04/05/23.  Last CBC Lab Results  Component Value Date   WBC 8.0  12/30/2022   HGB 14.5 12/30/2022   HCT 43.6 12/30/2022   MCV 96.3 12/30/2022   MCH 31.5 11/02/2022   RDW 14.0 12/30/2022   PLT 155.0 12/30/2022   Last metabolic panel Lab Results  Component Value Date   GLUCOSE 148 (H) 03/31/2023   NA 140 03/31/2023   K 4.1 03/31/2023   CL 102 03/31/2023   CO2 27 03/31/2023   BUN 17 03/31/2023   CREATININE 0.83 03/31/2023   GFRNONAA >60 11/02/2022   CALCIUM 9.8 03/31/2023   PHOS 3.9 08/05/2018   PROT 6.8 12/30/2022   ALBUMIN 4.0 12/30/2022   BILITOT 0.6 12/30/2022   ALKPHOS 55 12/30/2022   AST 27 12/30/2022   ALT 45 12/30/2022   ANIONGAP 7 11/02/2022  Last lipids Lab Results  Component Value Date   CHOL 179 12/30/2022   HDL 47.70 12/30/2022   LDLCALC 111 (H) 12/30/2022   TRIG 100.0 12/30/2022   CHOLHDL 4 12/30/2022   Last hemoglobin A1c Lab Results  Component Value Date   HGBA1C 8.1 (H) 12/30/2022   Last thyroid functions Lab Results  Component Value Date   TSH 1.75 03/31/2023   Last vitamin D Lab Results  Component Value Date   VD25OH 24.9 (L) 08/05/2018   Last vitamin B12 and Folate Lab Results  Component Value Date   VITAMINB12 334 12/30/2022      The ASCVD Risk score (Arnett DK, et al., 2019) failed to calculate for the following reasons:   The patient has a prior MI or stroke diagnosis    Assessment & Plan:   Problem List Items Addressed This Visit       Unprioritized   Hyperprolactinemia (HCC)   Relevant Orders   Lipid panel   CBC with Differential/Platelet   Comprehensive metabolic panel   Hyperlipidemia   Relevant Orders   Lipid panel   Comprehensive metabolic panel   Essential hypertension   Relevant Orders   Lipid panel   CBC with Differential/Platelet   Comprehensive metabolic panel   Other Visit Diagnoses     Type 2 diabetes mellitus with hyperglycemia, without long-term current use of insulin (HCC)    -  Primary   Relevant Orders   Hemoglobin A1c   Microalbumin / creatinine urine  ratio      Assessment and Plan    Mobility Limitations: Difficulty moving around due to leg weakness and fatigue. Patient relies on moving from chair to chair within the home. Strength in both arms and legs is 3/5. Patient is seeking a power wheelchair for improved mobility. -Complete face-to-face visit for power wheelchair assessment. -Submit referral to medical supply company for power wheelchair.  Arthritis: Patient reports difficulty raising arms above head due to cramping and locking up. -Continue current management plan.  Diabetes: Patient reports blood sugars ranging from 190 to 261. Recent foot wound requiring debridement, currently healing slowly. -Continue Metformin. -Order blood work to assess current glycemic control.  Foot Pain: Slow healing wound on foot causing pain described as stepping on a thumbtack. -Continue current wound care regimen. -Consider referral to podiatry if healing does not progress.  Hearing Loss: Patient reports both ears are bad and is in the process of getting a hearing aid. -Continue current management plan.  General Health Maintenance: -Encourage patient to continue learning about computer use for potential access to online health resources.       Return in about 6 months (around 10/05/2023), or if symptoms worsen or fail to improve, for hypertension, hyperlipidemia, diabetes II.    Donato Schultz, DO

## 2023-04-05 NOTE — Patient Instructions (Signed)

## 2023-04-06 ENCOUNTER — Telehealth: Payer: Self-pay | Admitting: Family Medicine

## 2023-04-06 LAB — COMPREHENSIVE METABOLIC PANEL
ALT: 50 U/L (ref 0–53)
AST: 35 U/L (ref 0–37)
Albumin: 4.4 g/dL (ref 3.5–5.2)
Alkaline Phosphatase: 48 U/L (ref 39–117)
BUN: 15 mg/dL (ref 6–23)
CO2: 26 mEq/L (ref 19–32)
Calcium: 10.1 mg/dL (ref 8.4–10.5)
Chloride: 102 mEq/L (ref 96–112)
Creatinine, Ser: 0.94 mg/dL (ref 0.40–1.50)
GFR: 87.03 mL/min (ref >60.00)
Glucose, Bld: 167 mg/dL — ABNORMAL HIGH (ref 70–99)
Potassium: 4.2 mEq/L (ref 3.5–5.1)
Sodium: 141 mEq/L (ref 135–145)
Total Bilirubin: 0.5 mg/dL (ref 0.2–1.2)
Total Protein: 7 g/dL (ref 6.0–8.3)

## 2023-04-06 LAB — CBC WITH DIFFERENTIAL/PLATELET
Basophils Absolute: 0.1 10*3/uL (ref 0.0–0.1)
Basophils Relative: 1 % (ref 0.0–3.0)
Eosinophils Absolute: 0.2 10*3/uL (ref 0.0–0.7)
Eosinophils Relative: 2.5 % (ref 0.0–5.0)
HCT: 46.8 % (ref 39.0–52.0)
Hemoglobin: 15.3 g/dL (ref 13.0–17.0)
Lymphocytes Relative: 28.1 % (ref 12.0–46.0)
Lymphs Abs: 2.1 10*3/uL (ref 0.7–4.0)
MCHC: 32.6 g/dL (ref 30.0–36.0)
MCV: 97.2 fl (ref 78.0–100.0)
Monocytes Absolute: 0.5 10*3/uL (ref 0.1–1.0)
Monocytes Relative: 6.6 % (ref 3.0–12.0)
Neutro Abs: 4.5 10*3/uL (ref 1.4–7.7)
Neutrophils Relative %: 61.8 % (ref 43.0–77.0)
Platelets: 176 10*3/uL (ref 150.0–400.0)
RBC: 4.81 Mil/uL (ref 4.22–5.81)
RDW: 13.9 % (ref 11.5–15.5)
WBC: 7.3 10*3/uL (ref 4.0–10.5)

## 2023-04-06 LAB — LIPID PANEL
Cholesterol: 104 mg/dL (ref 0–200)
HDL: 50.6 mg/dL (ref >39.00)
LDL Cholesterol: 27 mg/dL (ref 0–99)
NonHDL: 53.15
Total CHOL/HDL Ratio: 2
Triglycerides: 132 mg/dL (ref 0.0–149.0)
VLDL: 26.4 mg/dL (ref 0.0–40.0)

## 2023-04-06 LAB — MICROALBUMIN / CREATININE URINE RATIO
Creatinine,U: 180.6 mg/dL
Microalb Creat Ratio: 0.8 mg/g (ref 0.0–30.0)
Microalb, Ur: 1.4 mg/dL (ref 0.0–1.9)

## 2023-04-06 LAB — TESTOSTERONE,FREE AND TOTAL: Testosterone: 544 ng/dL (ref 264–916)

## 2023-04-06 LAB — HEMOGLOBIN A1C: Hgb A1c MFr Bld: 8.1 % — ABNORMAL HIGH (ref 4.6–6.5)

## 2023-04-06 NOTE — Telephone Encounter (Signed)
Terri from [facility] has called stating they need some additional chart information for the wheelchair referral besides the one on 6.1.24. Please advise  Camelia [phone]

## 2023-04-06 NOTE — Telephone Encounter (Signed)
Michael Pearson stated that she needed 2 additional chart notes.  Notes faxed over.

## 2023-04-12 ENCOUNTER — Other Ambulatory Visit: Payer: Self-pay

## 2023-04-12 MED ORDER — RYBELSUS 7 MG PO TABS: 7.0000 mg | ORAL_TABLET | Freq: Every day | ORAL | 2 refills | Status: DC

## 2023-04-20 DIAGNOSIS — G629 Polyneuropathy, unspecified: Secondary | ICD-10-CM | POA: Diagnosis not present

## 2023-04-20 DIAGNOSIS — E1165 Type 2 diabetes mellitus with hyperglycemia: Secondary | ICD-10-CM | POA: Diagnosis not present

## 2023-04-20 DIAGNOSIS — M792 Neuralgia and neuritis, unspecified: Secondary | ICD-10-CM | POA: Diagnosis not present

## 2023-04-27 ENCOUNTER — Ambulatory Visit: Payer: Medicare HMO | Admitting: Pharmacist

## 2023-04-27 ENCOUNTER — Ambulatory Visit (INDEPENDENT_AMBULATORY_CARE_PROVIDER_SITE_OTHER): Payer: Medicare HMO | Admitting: Podiatry

## 2023-04-27 DIAGNOSIS — E1165 Type 2 diabetes mellitus with hyperglycemia: Secondary | ICD-10-CM

## 2023-04-27 DIAGNOSIS — E785 Hyperlipidemia, unspecified: Secondary | ICD-10-CM

## 2023-04-27 DIAGNOSIS — L97511 Non-pressure chronic ulcer of other part of right foot limited to breakdown of skin: Secondary | ICD-10-CM | POA: Diagnosis not present

## 2023-04-27 DIAGNOSIS — G4733 Obstructive sleep apnea (adult) (pediatric): Secondary | ICD-10-CM | POA: Diagnosis not present

## 2023-04-27 DIAGNOSIS — I10 Essential (primary) hypertension: Secondary | ICD-10-CM

## 2023-04-27 NOTE — Progress Notes (Signed)
Subjective:  Patient ID: Michael Pearson, male    DOB: [birth year],  MRN: [id number]  Chief Complaint  Patient presents with   Foot Ulcer    Right foot ulcer , patient states some discharge but states little to no pain    62 y.o. male presents with the above complaint.  Patient presents with right submetatarsal 1 superficial ulceration.  He states started coming back again he went to get it evaluated denies any other acute complaints   Review of Systems: Negative except as noted in the HPI. Denies N/V/F/Ch.  Past Medical History:  Diagnosis Date   Allergy    Arthritis    Asthma    CAD (coronary artery disease)    NSTEMI 7/12:  Cardiac cath on 7/17: pLAD occluded, Dx 30-40%, pRCA 30-40%, mRCA 30-40%.  Proximal LAD was treated with a BMS.  Echo 7/19:  EF 60-65%, mild LVH.    ETT-Myoview 6/14:  Inf thinning, no ischemia, EF 64%, normal study // Myoview 04/2020: EF 57, normal perfusion; Low Risk     Cognitive communication deficit    Colitis    Diverticulosis    Duodenitis    May 2012 with heme positive stools at this time. Endorses only rare streaking of blood now.   External hemorrhoids    GERD (gastroesophageal reflux disease)    Heart murmur    as a child per the pt   Hiatal hernia    Hx of hemorrhoids    Hyperlipidemia    Hypertension    Kidney stones    Microadenoma    MRI in 2008 demonstrating 4.6 mm area of pituitary gland    Migraines    Has been evaluated multiple times in past for chronic headache in which pt has had occasional nose bleeds and bloodshot eyes. This lasted for 4-5 months. CT of the head was negative in May 2012.   Myocardial infarction (HCC) 05/17/2011   Pituitary tumor    RBBB (right bundle branch block)    Noted on EKG in 2008   Ulcer     Current Outpatient Medications:    Semaglutide (RYBELSUS) 7 MG TABS, Take 1 tablet (7 mg total) by mouth daily., Disp: 30 tablet, Rfl: 2   albuterol (VENTOLIN HFA) 108 (90 Base) MCG/ACT inhaler, Inhale 2 puffs into  the lungs every 4 (four) hours as needed for wheezing or shortness of breath. (Rescue inhaler), Disp: 1 each, Rfl: 2   aspirin EC 81 MG tablet, Take 81 mg by mouth in the morning. Swallow whole., Disp: , Rfl:    atorvastatin (LIPITOR) 40 MG tablet, Take 1 tablet (40 mg total) by mouth daily., Disp: 90 tablet, Rfl: 3   Blood Glucose Monitoring Suppl (ONETOUCH VERIO FLEX SYSTEM) w/Device KIT, Use to check blood glucose once a day (Dx: type 2 DM E11.65), Disp: 1 kit, Rfl: 0   cabergoline (DOSTINEX) 0.5 MG tablet, Take 1 tablet (0.5 mg total) by mouth 2 (two) times a week. (Patient taking differently: Take 0.5 tablets by mouth 2 (two) times a week.), Disp: 25 tablet, Rfl: 1   cetirizine (ZYRTEC) 10 MG tablet, Take 10 mg by mouth daily as needed for allergies. , Disp: , Rfl:    diclofenac Sodium (VOLTAREN) 1 % GEL, Apply 4 g topically 4 (four) times daily., Disp: 100 g, Rfl: 2   docusate sodium (COLACE) 100 MG capsule, Take 100 mg by mouth 2 (two) times daily as needed (constipation.)., Disp: , Rfl:    esomeprazole (NEXIUM) 40 MG  capsule, Take 1 capsule (40 mg total) by mouth 2 (two) times daily before a meal., Disp: 180 capsule, Rfl: 3   ezetimibe (ZETIA) 10 MG tablet, Take 1 tablet (10 mg total) by mouth daily., Disp: 90 tablet, Rfl: 3   fluticasone-salmeterol (WIXELA INHUB) 250-50 MCG/ACT AEPB, Inhale 1 puff into the lungs in the morning and at bedtime. Replaces Trelegy inhaler as Maintenance inhaler, Disp: 60 each, Rfl: 3   glucose blood (ONETOUCH VERIO) test strip, Use to check blood glucose once a day (Dx: type 2 DM E11.65), Disp: 100 each, Rfl: 5   Lancets (ONETOUCH DELICA PLUS LANCET33G) MISC, Use to check blood glucose once a day (Dx: type 2 DM E11.65), Disp: 100 each, Rfl: 5   losartan (COZAAR) 50 MG tablet, Take 1 tablet (50 mg total) by mouth daily., Disp: 90 tablet, Rfl: 3   meclizine (ANTIVERT) 25 MG tablet, Take 1 tablet (25 mg total) by mouth 3 (three) times daily as needed for dizziness.,  Disp: 21 tablet, Rfl: 0   metFORMIN (GLUCOPHAGE-XR) 500 MG 24 hr tablet, Take 1 tablet (500 mg total) by mouth daily., Disp: 90 tablet, Rfl: 1   Multiple Vitamin (MULTIVITAMIN WITH MINERALS) TABS tablet, Take 1 tablet by mouth in the morning., Disp: , Rfl:    silver sulfADIAZINE (SILVADENE) 1 % cream, Apply 1 Application topically daily., Disp: 50 g, Rfl: 0   tiZANidine (ZANAFLEX) 4 MG tablet, Take 1 tablet (4 mg total) by mouth 3 (three) times daily as needed for muscle spasms., Disp: 40 tablet, Rfl: 0  Social History   Tobacco Use  Smoking Status Never  Smokeless Tobacco Never    No Known Allergies Objective:   There were no vitals filed for this visit.  There is no height or weight on file to calculate BMI. Constitutional Well developed. Well nourished.  Vascular Dorsalis pedis pulses palpable bilaterally. Posterior tibial pulses palpable bilaterally. Capillary refill normal to all digits.  No cyanosis or clubbing noted. Pedal hair growth normal.  Neurologic Normal speech. Oriented to person, place, and time. Epicritic sensation to light touch grossly present bilaterally.  Dermatologic Right submetatarsal 1 ulceration noted limited to the breakdown of skin granular wound bed no malodor present note does not probe down to bone.  No erythema noted  Orthopedic: Normal joint ROM without pain or crepitus bilaterally. No visible deformities. No bony tenderness.   Radiographs: None Assessment:   No diagnosis found.  Plan:  Patient was evaluated and treated and all questions answered.  Right submetatarsal 1 ulceration limited to the breakdown of the skin~recurrence -All questions and concerns were discussed with the patient in extensive detail. -Given the amount of year well ulceration is present patient Silvadene cream.  Patient will transition from Betadine wet-to-dry to 70 -Silvadene was sent to the pharmacy -Offloading pads were dispensed   No follow-ups on file.

## 2023-04-27 NOTE — Progress Notes (Signed)
Chronic Care Management Pharmacy Note  04/27/2023 Name:  DEWAN EMOND MRN:  [id number] DOB:  [birth year]  No chief complaint on file.    Subjective: Michael Pearson is an 62 y.o. year old male who is a primary patient of Donato Schultz, DO.  The patient was referred to the Chronic Care Management team for assistance with care management needs subsequent to provider initiation of CCM services and plan of care.    Engaged with patient by telephone.   For Chronic Care Management follow up   Diabetes: Current medications: Rybelsus 7mg  each morning, metformin ER 500mg  daily Started new dose of Rybelsus 7mg  around 04/12/2023  A1c prior to starting Rybelsus was 8.5%; Current A1c is 8.1% Weight prior to starting Rybelsus was 293 lbs; Current weight = 273lbs Total weight loss 20lbs  Wt Readings from Last 3 Encounters:  04/05/23 273 lb (123.8 kg)  03/30/23 274 lb 9.6 oz (124.6 kg)  11/02/22 280 lb (127 kg)   Current glucose readings:  Fasting: 160 to 190  Post prandial:  170 to 190 (has 1 reading of 351 about 2 weeks ago)  Blood glucose when BMET was checked 04/05/2023 was 167 (fasting)   Patient reports hypoglycemic s/sx including occasional dizziness and fatigue sometimes but denies shakiness, sweating.  Patient denies hyperglycemic symptoms including no polyuria, polydipsia, polyphagia, nocturia, neuropathy, blurred vision  Hyperprolactinemia / pituitary adenoma: Had first visit with Dr Melvyn Novas on 03/30/2023 at [facility] f/u of pituitary adenoma. Several labs checked. F/U 6 weeks.   . Hypertension:  Current therapy - losartan 50mg  daily - LR was 04/04/2023 for 90 days Not checking blood pressure at home.   BP Readings from Last 3 Encounters:  04/05/23 120/78  03/30/23 130/70  12/30/22 110/70    Objective:  Lab Results  Component Value Date   HGBA1C 8.1 (H) 04/05/2023    Lab Results  Component Value Date   CREATININE 0.94 04/05/2023   BUN 15  04/05/2023   NA 141 04/05/2023   K 4.2 04/05/2023   CL 102 04/05/2023   CO2 26 04/05/2023    Lab Results  Component Value Date   CHOL 104 04/05/2023   HDL 50.60 04/05/2023   LDLCALC 27 04/05/2023   TRIG 132.0 04/05/2023   CHOLHDL 2 04/05/2023    Medications Reviewed Today     Reviewed by Donato Schultz, DO (Physician) on 04/05/23 at 1726  Med List Status: <None>   Medication Order Taking? Sig Documenting Provider Last Dose Status Informant  albuterol (VENTOLIN HFA) 108 (90 Base) MCG/ACT inhaler [id number] No Inhale 2 puffs into the lungs every 4 (four) hours as needed for wheezing or shortness of breath. (Rescue inhaler) Donato Schultz, DO Taking Active   aspirin EC 81 MG tablet [id number] No Take 81 mg by mouth in the morning. Swallow whole. [provider] Taking Active Self  atorvastatin (LIPITOR) 40 MG tablet [id number] No Take 1 tablet (40 mg total) by mouth daily. Donato Schultz, DO Taking Active   Blood Glucose Monitoring Suppl (ONETOUCH VERIO FLEX SYSTEM) w/Device KIT [id number] No Use to check blood glucose once a day (Dx: type 2 DM E11.65) Zola Button, Grayling Congress, DO Taking Active   cabergoline (DOSTINEX) 0.5 MG tablet [id number] No Take 1 tablet (0.5 mg total) by mouth 2 (two) times a week.  Patient taking differently: Take 0.5 tablets by mouth 2 (two) times a week.   Donato Schultz, DO Taking Active  cetirizine (ZYRTEC) 10 MG tablet [id number] No Take 10 mg by mouth daily as needed for allergies.  [provider] Taking Active Self  diclofenac Sodium (VOLTAREN) 1 % GEL [id number] No Apply 4 g topically 4 (four) times daily. Donato Schultz, DO Taking Active   docusate sodium (COLACE) 100 MG capsule [id number] No Take 100 mg by mouth 2 (two) times daily as needed (constipation.). [provider] Taking Active Self  esomeprazole (NEXIUM) 40 MG capsule [id number] No Take 1 capsule (40 mg total) by mouth 2 (two) times daily  before a meal. Zola Button, Grayling Congress, DO Taking Active   ezetimibe (ZETIA) 10 MG tablet [id number] No Take 1 tablet (10 mg total) by mouth daily. Zola Button, Myrene Buddy R, DO Taking Active   fluticasone-salmeterol [facility] INHUB) 250-50 MCG/ACT AEPB [id number] No Inhale 1 puff into the lungs in the morning and at bedtime. Replaces Trelegy inhaler as Maintenance inhaler Donato Schultz, DO Taking Active   glucose blood (ONETOUCH VERIO) test strip [id number] No Use to check blood glucose once a day (Dx: type 2 DM E11.65) Zola Button, Grayling Congress, DO Taking Active   Lancets [facility] [place] [place] PLUS [place]) MISC [id number] No Use to check blood glucose once a day (Dx: type 2 DM E11.65) Zola Button, Grayling Congress, DO Taking Active   losartan (COZAAR) 50 MG tablet [id number] No Take 1 tablet (50 mg total) by mouth daily. Donato Schultz, DO Taking Active   meclizine (ANTIVERT) 25 MG tablet [id number] No Take 1 tablet (25 mg total) by mouth 3 (three) times daily as needed for dizziness. Wanda Plump, MD Taking Active   metFORMIN (GLUCOPHAGE-XR) 500 MG 24 hr tablet [id number] No Take 1 tablet (500 mg total) by mouth daily. Donato Schultz, DO Taking Active   Multiple Vitamin (MULTIVITAMIN WITH MINERALS) TABS tablet [id number] No Take 1 tablet by mouth in the morning. [provider] Taking Active Self  Semaglutide (RYBELSUS) 3 MG TABS [id number]  Take 2 tablets (6 mg total) by mouth daily before breakfast. Will take 2 of the 3mg  tablets until completes current prescription, then will send in Rx for 7mg  tabs. Henrene Pastor, RPH-CPP  Active   silver sulfADIAZINE (SILVADENE) 1 % cream [id number]  Apply 1 Application topically daily. Candelaria Stagers, DPM  Active   tiZANidine (ZANAFLEX) 4 MG tablet [id number]  Take 1 tablet (4 mg total) by mouth 3 (three) times daily as needed for muscle spasms. Donato Schultz, DO  Active             Medication Assistance: Patient has qualified for Medicaid for 2024.  Most medications $0   Assessment/Plan:   Diabetes: Not at A1c goal of < 7.0% - Reviewed goal A1c, goal fasting, and goal 2 hour post prandial glucose - continue metformin ER 500mg  daily and Rybelsus 7mg  once a day. Reminded to take Rybelsus 30 minutes prior to breakfast with a sip of water.   - Continue to check blood glucose once daily - varying time of day - Reviewed home blood glucose goals  Fasting blood glucose goal (before meals) = 80 to 130 Blood glucose goal after a meal = less than 180   Hypertension:  Continue losartan - called [facility] at patient request to ask for refill.   Discussed that the following medications will be due next week - atorvastatin, ezetimibe, esomeprazole and Wixela inhaler. Patient plans to fill his weekly pill container and will reorder then.  Follow up in 10 to 14 days  Henrene Pastor, PharmD Clinical Pharmacist [facility]

## 2023-05-01 DIAGNOSIS — E1165 Type 2 diabetes mellitus with hyperglycemia: Secondary | ICD-10-CM

## 2023-05-01 DIAGNOSIS — I10 Essential (primary) hypertension: Secondary | ICD-10-CM

## 2023-05-01 DIAGNOSIS — E785 Hyperlipidemia, unspecified: Secondary | ICD-10-CM

## 2023-05-01 DIAGNOSIS — J4520 Mild intermittent asthma, uncomplicated: Secondary | ICD-10-CM

## 2023-05-25 ENCOUNTER — Ambulatory Visit (INDEPENDENT_AMBULATORY_CARE_PROVIDER_SITE_OTHER): Payer: Medicare HMO | Admitting: "Endocrinology

## 2023-05-25 ENCOUNTER — Ambulatory Visit (INDEPENDENT_AMBULATORY_CARE_PROVIDER_SITE_OTHER): Payer: Medicare HMO | Admitting: Podiatry

## 2023-05-25 ENCOUNTER — Encounter: Payer: Self-pay | Admitting: "Endocrinology

## 2023-05-25 VITALS — BP 115/70 | HR 67 | Ht 74.0 in | Wt 271.8 lb

## 2023-05-25 DIAGNOSIS — L97511 Non-pressure chronic ulcer of other part of right foot limited to breakdown of skin: Secondary | ICD-10-CM | POA: Diagnosis not present

## 2023-05-25 DIAGNOSIS — D352 Benign neoplasm of pituitary gland: Secondary | ICD-10-CM | POA: Diagnosis not present

## 2023-05-25 MED ORDER — CABERGOLINE 0.5 MG PO TABS: 0.2500 mg | ORAL_TABLET | ORAL | 1 refills | Status: AC

## 2023-05-25 NOTE — Progress Notes (Signed)
Subjective:  Patient ID: Michael Pearson, male    DOB: 05/23/1961,  MRN: [id number]  Chief Complaint  Patient presents with   Callouses    Preulcerous callus, still a little sore    62 y.o. male presents with the above complaint.  Patient presents with right submetatarsal 1 superficial ulceration follow-up.  He states minimal Betadine wet-to-dry dressing and looks a lot better feels a lot better denies any other acute complaints.   Review of Systems: Negative except as noted in the HPI. Denies N/V/F/Ch.  Past Medical History:  Diagnosis Date   Allergy    Arthritis    Asthma    CAD (coronary artery disease)    NSTEMI 7/12:  Cardiac cath on 7/17: pLAD occluded, Dx 30-40%, pRCA 30-40%, mRCA 30-40%.  Proximal LAD was treated with a BMS.  Echo 7/19:  EF 60-65%, mild LVH.    ETT-Myoview 6/14:  Inf thinning, no ischemia, EF 64%, normal study // Myoview 04/2020: EF 57, normal perfusion; Low Risk     Cognitive communication deficit    Colitis    Diverticulosis    Duodenitis    May 2012 with heme positive stools at this time. Endorses only rare streaking of blood now.   External hemorrhoids    GERD (gastroesophageal reflux disease)    Heart murmur    as a child per the pt   Hiatal hernia    Hx of hemorrhoids    Hyperlipidemia    Hypertension    Kidney stones    Microadenoma    MRI in 2008 demonstrating 4.6 mm area of pituitary gland    Migraines    Has been evaluated multiple times in past for chronic headache in which pt has had occasional nose bleeds and bloodshot eyes. This lasted for 4-5 months. CT of the head was negative in May 2012.   Myocardial infarction (HCC) 05/17/2011   Pituitary tumor    RBBB (right bundle branch block)    Noted on EKG in 2008   Ulcer     Current Outpatient Medications:    albuterol (VENTOLIN HFA) 108 (90 Base) MCG/ACT inhaler, Inhale 2 puffs into the lungs every 4 (four) hours as needed for wheezing or shortness of breath. (Rescue inhaler), Disp: 1 each,  Rfl: 2   aspirin EC 81 MG tablet, Take 81 mg by mouth in the morning. Swallow whole., Disp: , Rfl:    atorvastatin (LIPITOR) 40 MG tablet, Take 1 tablet (40 mg total) by mouth daily., Disp: 90 tablet, Rfl: 3   Blood Glucose Monitoring Suppl (ONETOUCH VERIO FLEX SYSTEM) w/Device KIT, Use to check blood glucose once a day (Dx: type 2 DM E11.65), Disp: 1 kit, Rfl: 0   [START ON 05/26/2023] cabergoline (DOSTINEX) 0.5 MG tablet, Take 0.5 tablets (0.25 mg total) by mouth 2 (two) times a week., Disp: 12 tablet, Rfl: 1   cetirizine (ZYRTEC) 10 MG tablet, Take 10 mg by mouth daily as needed for allergies. , Disp: , Rfl:    diclofenac Sodium (VOLTAREN) 1 % GEL, Apply 4 g topically 4 (four) times daily., Disp: 100 g, Rfl: 2   docusate sodium (COLACE) 100 MG capsule, Take 100 mg by mouth 2 (two) times daily as needed (constipation.)., Disp: , Rfl:    esomeprazole (NEXIUM) 40 MG capsule, Take 1 capsule (40 mg total) by mouth 2 (two) times daily before a meal., Disp: 180 capsule, Rfl: 3   ezetimibe (ZETIA) 10 MG tablet, Take 1 tablet (10 mg total) by mouth  daily., Disp: 90 tablet, Rfl: 3   fluticasone-salmeterol (WIXELA INHUB) 250-50 MCG/ACT AEPB, Inhale 1 puff into the lungs in the morning and at bedtime. Replaces Trelegy inhaler as Maintenance inhaler, Disp: 60 each, Rfl: 3   glucose blood (ONETOUCH VERIO) test strip, Use to check blood glucose once a day (Dx: type 2 DM E11.65), Disp: 100 each, Rfl: 5   Lancets (ONETOUCH DELICA PLUS LANCET33G) MISC, Use to check blood glucose once a day (Dx: type 2 DM E11.65), Disp: 100 each, Rfl: 5   losartan (COZAAR) 50 MG tablet, Take 1 tablet (50 mg total) by mouth daily., Disp: 90 tablet, Rfl: 3   meclizine (ANTIVERT) 25 MG tablet, Take 1 tablet (25 mg total) by mouth 3 (three) times daily as needed for dizziness., Disp: 21 tablet, Rfl: 0   metFORMIN (GLUCOPHAGE-XR) 500 MG 24 hr tablet, Take 1 tablet (500 mg total) by mouth daily., Disp: 90 tablet, Rfl: 1   Multiple Vitamin  (MULTIVITAMIN WITH MINERALS) TABS tablet, Take 1 tablet by mouth in the morning., Disp: , Rfl:    Semaglutide (RYBELSUS) 7 MG TABS, Take 1 tablet (7 mg total) by mouth daily., Disp: 30 tablet, Rfl: 2   silver sulfADIAZINE (SILVADENE) 1 % cream, Apply 1 Application topically daily., Disp: 50 g, Rfl: 0   tiZANidine (ZANAFLEX) 4 MG tablet, Take 1 tablet (4 mg total) by mouth 3 (three) times daily as needed for muscle spasms., Disp: 40 tablet, Rfl: 0  Social History   Tobacco Use  Smoking Status Never  Smokeless Tobacco Never    No Known Allergies Objective:   There were no vitals filed for this visit.  There is no height or weight on file to calculate BMI. Constitutional Well developed. Well nourished.  Vascular Dorsalis pedis pulses palpable bilaterally. Posterior tibial pulses palpable bilaterally. Capillary refill normal to all digits.  No cyanosis or clubbing noted. Pedal hair growth normal.  Neurologic Normal speech. Oriented to person, place, and time. Epicritic sensation to light touch grossly present bilaterally.  Dermatologic Right hallux submetatarsal 1 ulceration.  This has completely reepithelialized no further signs of wounds noted.  No signs of infection noted.  Orthopedic: Normal joint ROM without pain or crepitus bilaterally. No visible deformities. No bony tenderness.   Radiographs: None Assessment:   No diagnosis found.  Plan:  Patient was evaluated and treated and all questions answered.  Right submetatarsal 1 ulceration limited to the breakdown of the skin -All questions and concerns were discussed with the patient in extensive detail. -Clinically healed.  At this time patient states that he is doing much better no drainage the redness has improved considerably.  If any foot and ankle issues on future he will come back and see me.  I discussed with him modification.

## 2023-05-25 NOTE — Progress Notes (Signed)
Outpatient Endocrinology Note Michael [place], MD    Michael Pearson [birth year] [id number]  Referring Provider: Zola Pearson, Michael Pearson, * Primary Care Provider: Zola Pearson, Michael Congress, DO Reason for consultation: Subjective   Assessment & Plan  Michael Pearson was seen today for piturity adenoma.  Diagnoses and all orders for this visit:  Pituitary adenoma [facility]) -     MR Brain W Wo Contrast; Future -     MR Brain W Wo Contrast -     Prolactin; Future  Other orders -     cabergoline (DOSTINEX) 0.5 MG tablet; Take 0.5 tablets (0.25 mg total) by mouth 2 (two) times a week.   On Cabergoline - takes 1 tablet (0.5 mg total) by mouth 2 (two) times a week. Denies any S/E Prolactin at 6.3 Recommend Cabergoline - takes 1/2 tablet (0.25 mg total) by mouth 2 (two) times a week.  09/14/2020  MRI HEAD WITHOUT AND WITH CONTRAST COMPARISON:  MRI of the brain June 14, 2019.  IMPRESSION: Stable 7 mm pituitary microadenoma to the right of midline.  Baseline 8 am pituitary labs WNL -has normal sleep cycle  Repeat prolactin in 69mo Follow up with eye doctor   Return in about 3 months (around 08/25/2023) for visit + 8 am labs before next visit.   I have reviewed current medications, nurse's notes, allergies, vital signs, past medical and surgical history, family medical history, and social history for this encounter. Counseled patient on symptoms, examination findings, lab findings, imaging results, treatment decisions and monitoring and prognosis. The patient understood the recommendations and agrees with the treatment plan. All questions regarding treatment plan were fully answered.  Michael , MD  05/25/23   History of Present Illness HPI  Michael Pearson is a 62 y.o. year old male who returns for f/u of pituitary microprolactinoma (in 2005, pt was incidentally noted to have a pituitary adenoma; in 2017, elev. prolactin was noted; f/u MRI in 2020 showed adenoma was down to 7 mm 2021 showed no  change; h/o near-syncope limits cabergoltine dosage; other pituitary functions are normal).    Continues to have mild head aches Denies nauseous anymore, no vomiting Had chronic lightheadedness if gets up fast-sometimes Has not seen eye doctor in 2 years, wears glasses, and reports needing to see eye doctor   Has been trying to lose weight, lost 15 lbs in 3 mo Reports sometimes having abdominal pain   Gets tired really quick, and intermittent constipation Denies heat intolerance  Denies breast discharge  On Cabergoline - takes 1 tablet (0.5 mg total) by mouth 2 (two) times a week. Denies any S/E  09/14/2020  MRI HEAD WITHOUT AND WITH CONTRAST COMPARISON:  MRI of the brain June 14, 2019.   IMPRESSION: Stable 7 mm pituitary microadenoma to the right of midline.   Physical Exam  BP 115/70   Pulse 67   Ht 6\' 2"  (1.88 m)   Wt 271 lb 12.8 oz (123.3 kg)   SpO2 95%   BMI 34.90 kg/m    Constitutional: well developed, well nourished Head: normocephalic, atraumatic Eyes: sclera anicteric, no redness Neck: supple Lungs: normal respiratory effort Neurology: alert and oriented Skin: dry, no appreciable rashes Musculoskeletal: no appreciable defects Psychiatric: normal mood and affect   Current Medications Patient's Medications  New Prescriptions   No medications on file  Previous Medications   ALBUTEROL (VENTOLIN HFA) 108 (90 BASE) MCG/ACT INHALER    Inhale 2 puffs into the lungs every 4 (four)  hours as needed for wheezing or shortness of breath. (Rescue inhaler)   ASPIRIN EC 81 MG TABLET    Take 81 mg by mouth in the morning. Swallow whole.   ATORVASTATIN (LIPITOR) 40 MG TABLET    Take 1 tablet (40 mg total) by mouth daily.   BLOOD GLUCOSE MONITORING SUPPL (ONETOUCH VERIO FLEX SYSTEM) W/DEVICE KIT    Use to check blood glucose once a day (Dx: type 2 DM E11.65)   CETIRIZINE (ZYRTEC) 10 MG TABLET    Take 10 mg by mouth daily as needed for allergies.    DICLOFENAC SODIUM  (VOLTAREN) 1 % GEL    Apply 4 g topically 4 (four) times daily.   DOCUSATE SODIUM (COLACE) 100 MG CAPSULE    Take 100 mg by mouth 2 (two) times daily as needed (constipation.).   ESOMEPRAZOLE (NEXIUM) 40 MG CAPSULE    Take 1 capsule (40 mg total) by mouth 2 (two) times daily before a meal.   EZETIMIBE (ZETIA) 10 MG TABLET    Take 1 tablet (10 mg total) by mouth daily.   FLUTICASONE-SALMETEROL (WIXELA INHUB) 250-50 MCG/ACT AEPB    Inhale 1 puff into the lungs in the morning and at bedtime. Replaces Trelegy inhaler as Maintenance inhaler   GLUCOSE BLOOD (ONETOUCH VERIO) TEST STRIP    Use to check blood glucose once a day (Dx: type 2 DM E11.65)   LANCETS (ONETOUCH DELICA PLUS LANCET33G) MISC    Use to check blood glucose once a day (Dx: type 2 DM E11.65)   LOSARTAN (COZAAR) 50 MG TABLET    Take 1 tablet (50 mg total) by mouth daily.   MECLIZINE (ANTIVERT) 25 MG TABLET    Take 1 tablet (25 mg total) by mouth 3 (three) times daily as needed for dizziness.   METFORMIN (GLUCOPHAGE-XR) 500 MG 24 HR TABLET    Take 1 tablet (500 mg total) by mouth daily.   MULTIPLE VITAMIN (MULTIVITAMIN WITH MINERALS) TABS TABLET    Take 1 tablet by mouth in the morning.   SEMAGLUTIDE (RYBELSUS) 7 MG TABS    Take 1 tablet (7 mg total) by mouth daily.   SILVER SULFADIAZINE (SILVADENE) 1 % CREAM    Apply 1 Application topically daily.   TIZANIDINE (ZANAFLEX) 4 MG TABLET    Take 1 tablet (4 mg total) by mouth 3 (three) times daily as needed for muscle spasms.  Modified Medications   Modified Medication Previous Medication   CABERGOLINE (DOSTINEX) 0.5 MG TABLET cabergoline (DOSTINEX) 0.5 MG tablet      Take 0.5 tablets (0.25 mg total) by mouth 2 (two) times a week.    Take 1 tablet (0.5 mg total) by mouth 2 (two) times a week.  Discontinued Medications   No medications on file    Allergies No Known Allergies  Past Medical History Past Medical History:  Diagnosis Date   Allergy    Arthritis    Asthma    CAD (coronary  artery disease)    NSTEMI 7/12:  Cardiac cath on 7/17: pLAD occluded, Dx 30-40%, pRCA 30-40%, mRCA 30-40%.  Proximal LAD was treated with a BMS.  Echo 7/19:  EF 60-65%, mild LVH.    ETT-Myoview 6/14:  Inf thinning, no ischemia, EF 64%, normal study // Myoview 04/2020: EF 57, normal perfusion; Low Risk     Cognitive communication deficit    Colitis    Diverticulosis    Duodenitis    May 2012 with heme positive stools at this time. Endorses  only rare streaking of blood now.   External hemorrhoids    GERD (gastroesophageal reflux disease)    Heart murmur    as a child per the pt   Hiatal hernia    Hx of hemorrhoids    Hyperlipidemia    Hypertension    Kidney stones    Microadenoma    MRI in 2008 demonstrating 4.6 mm area of pituitary gland    Migraines    Has been evaluated multiple times in past for chronic headache in which pt has had occasional nose bleeds and bloodshot eyes. This lasted for 4-5 months. CT of the head was negative in May 2012.   Myocardial infarction (HCC) 05/17/2011   Pituitary tumor    RBBB (right bundle branch block)    Noted on EKG in 2008   Ulcer     Past Surgical History Past Surgical History:  Procedure Laterality Date   BIOPSY  10/15/2021   Procedure: BIOPSY;  Surgeon: Beverley Fiedler, MD;  Location: [facility];  Service: Gastroenterology;;   COLONOSCOPY     egd  03/05/2011   Dr. Elnoria Howard   ESOPHAGOGASTRODUODENOSCOPY (EGD) WITH PROPOFOL N/A 10/15/2021   Procedure: ESOPHAGOGASTRODUODENOSCOPY (EGD) WITH PROPOFOL;  Surgeon: Beverley Fiedler, MD;  Location: [facility];  Service: Gastroenterology;  Laterality: N/A;  possible dilation   FLEXIBLE SIGMOIDOSCOPY  03/05/2011   Dr. Elnoria Howard   HAMMER TOE SURGERY     Heart stent     HERNIA REPAIR     In 20's   MALONEY DILATION  10/15/2021   Procedure: MALONEY DILATION;  Surgeon: Beverley Fiedler, MD;  Location: [facility];  Service: Gastroenterology;;  52cm    ORIF TIBIA PLATEAU Left 08/04/2018   Procedure: OPEN REDUCTION  INTERNAL FIXATION (ORIF) TIBIAL PLATEAU;  Surgeon: Myrene Galas, MD;  Location: [facility] OR;  Service: Orthopedics;  Laterality: Left;   SIGMOIDOSCOPY     TOOTH EXTRACTION     UPPER GASTROINTESTINAL ENDOSCOPY      Family History family history includes Cancer in his father; Colon cancer in his maternal aunt; Colon polyps in his maternal aunt and paternal grandmother; Coronary artery disease in his brother; Heart attack (age of onset: 53) in his father; Hypertension in his brother; Obesity in his brother; Skin cancer in his father; Stroke in his mother.  Social History Social History   Socioeconomic History   Marital status: Single    Spouse name: Not on file   Number of children: Not on file   Years of education: Not on file   Highest education level: Not on file  Occupational History   Occupation: TEFL teacher-- cleans 3 buildings    Comment: Fairly physical job    Employer: [employer]  Tobacco Use   Smoking status: Never   Smokeless tobacco: Never  Vaping Use   Vaping status: Never Used  Substance and Sexual Activity   Alcohol use: No   Drug use: No   Sexual activity: Not Currently  Other Topics Concern   Not on file  Social History Narrative   Lives with brother and mother   Mother is quite sick, he and his brother take turns taking care of her   No children   Social Determinants of Health   Financial Resource Strain: Low Risk  (01/25/2023)   Overall Financial Resource Strain (CARDIA)    Difficulty of Paying Living Expenses: Not very hard  Food Insecurity: No Food Insecurity (01/25/2023)   Hunger Vital Sign    Worried About Running  Out of Food in the Last Year: Never true    Ran Out of Food in the Last Year: Never true  Transportation Needs: No Transportation Needs (01/25/2023)   PRAPARE - Administrator, Civil Service (Medical): No    Lack of Transportation (Non-Medical): No  Physical Activity: Sufficiently Active (01/25/2023)   Exercise Vital Sign     Days of Exercise per Week: 6 days    Minutes of Exercise per Session: 30 min  Stress: No Stress Concern Present (07/09/2021)   [employer] of Occupational Health - Occupational Stress Questionnaire    Feeling of Stress : Not at all  Social Connections: Socially Isolated (01/25/2023)   Social Connection and Isolation Panel [NHANES]    Frequency of Communication with Friends and Family: More than three times a week    Frequency of Social Gatherings with Friends and Family: More than three times a week    Attends Religious Services: Never    Database administrator or Organizations: No    Attends Banker Meetings: Never    Marital Status: Never married  Intimate Partner Violence: Not At Risk (07/09/2021)   Humiliation, Afraid, Rape, and Kick questionnaire    Fear of Current or Ex-Partner: No    Emotionally Abused: No    Physically Abused: No    Sexually Abused: No    Lab Results  Component Value Date   CHOL 104 04/05/2023   Lab Results  Component Value Date   HDL 50.60 04/05/2023   Lab Results  Component Value Date   LDLCALC 27 04/05/2023   Lab Results  Component Value Date   TRIG 132.0 04/05/2023   Lab Results  Component Value Date   CHOLHDL 2 04/05/2023   Lab Results  Component Value Date   CREATININE 0.94 04/05/2023   Lab Results  Component Value Date   GFR 87.03 04/05/2023      Component Value Date/Time   NA 141 04/05/2023 1537   K 4.2 04/05/2023 1537   CL 102 04/05/2023 1537   CO2 26 04/05/2023 1537   GLUCOSE 167 (H) 04/05/2023 1537   BUN 15 04/05/2023 1537   CREATININE 0.94 04/05/2023 1537   CALCIUM 10.1 04/05/2023 1537   CALCIUM 8.6 (L) 08/05/2018 0514   PROT 7.0 04/05/2023 1537   ALBUMIN 4.4 04/05/2023 1537   AST 35 04/05/2023 1537   ALT 50 04/05/2023 1537   ALKPHOS 48 04/05/2023 1537   BILITOT 0.5 04/05/2023 1537   GFRNONAA >60 11/02/2022 1545   GFRAA >60 08/05/2018 0514      Latest Ref Rng & Units 04/05/2023    3:37 PM  03/31/2023    7:53 AM 12/30/2022    2:26 PM  BMP  Glucose 70 - 99 mg/dL 161  096  045   BUN 6 - 23 mg/dL 15  17  14    Creatinine 0.40 - 1.50 mg/dL 4.09  8.11  9.14   Sodium 135 - 145 mEq/L 141  140  135   Potassium 3.5 - 5.1 mEq/L 4.2  4.1  4.4   Chloride 96 - 112 mEq/L 102  102  100   CO2 19 - 32 mEq/L 26  27  27    Calcium 8.4 - 10.5 mg/dL 78.2  9.8  9.8        Component Value Date/Time   WBC 7.3 04/05/2023 1537   RBC 4.81 04/05/2023 1537   HGB 15.3 04/05/2023 1537   HCT 46.8 04/05/2023 1537   PLT  176.0 04/05/2023 1537   MCV 97.2 04/05/2023 1537   MCH 31.5 11/02/2022 1545   MCHC 32.6 04/05/2023 1537   RDW 13.9 04/05/2023 1537   LYMPHSABS 2.1 04/05/2023 1537   MONOABS 0.5 04/05/2023 1537   EOSABS 0.2 04/05/2023 1537   BASOSABS 0.1 04/05/2023 1537   Lab Results  Component Value Date   TSH 1.75 03/31/2023   TSH 0.91 12/30/2022   TSH 1.64 06/25/2022   FREET4 0.80 03/31/2023   FREET4 0.74 07/29/2021   FREET4 0.76 07/16/2020         Parts of this note may have been dictated using voice recognition software. There may be variances in spelling and vocabulary which are unintentional. Not all errors are proofread. Please notify the Thereasa Parkin if any discrepancies are noted or if the meaning of any statement is not clear.

## 2023-05-27 DIAGNOSIS — G4733 Obstructive sleep apnea (adult) (pediatric): Secondary | ICD-10-CM | POA: Diagnosis not present

## 2023-06-02 ENCOUNTER — Ambulatory Visit (INDEPENDENT_AMBULATORY_CARE_PROVIDER_SITE_OTHER): Payer: Medicare HMO | Admitting: Family Medicine

## 2023-06-02 ENCOUNTER — Encounter: Payer: Self-pay | Admitting: Family Medicine

## 2023-06-02 VITALS — BP 110/80 | HR 68 | Temp 98.1°F | Resp 20 | Ht 74.0 in | Wt 269.6 lb

## 2023-06-02 DIAGNOSIS — E221 Hyperprolactinemia: Secondary | ICD-10-CM | POA: Diagnosis not present

## 2023-06-02 DIAGNOSIS — E785 Hyperlipidemia, unspecified: Secondary | ICD-10-CM | POA: Diagnosis not present

## 2023-06-02 DIAGNOSIS — E1165 Type 2 diabetes mellitus with hyperglycemia: Secondary | ICD-10-CM

## 2023-06-02 DIAGNOSIS — Z7984 Long term (current) use of oral hypoglycemic drugs: Secondary | ICD-10-CM

## 2023-06-02 DIAGNOSIS — I10 Essential (primary) hypertension: Secondary | ICD-10-CM

## 2023-06-02 DIAGNOSIS — D352 Benign neoplasm of pituitary gland: Secondary | ICD-10-CM | POA: Diagnosis not present

## 2023-06-02 LAB — CBC WITH DIFFERENTIAL/PLATELET
Basophils Absolute: 0 10*3/uL (ref 0.0–0.1)
Basophils Relative: 0.8 % (ref 0.0–3.0)
Eosinophils Absolute: 0.1 10*3/uL (ref 0.0–0.7)
Eosinophils Relative: 2.3 % (ref 0.0–5.0)
HCT: 46 % (ref 39.0–52.0)
Hemoglobin: 14.9 g/dL (ref 13.0–17.0)
Lymphocytes Relative: 34.6 % (ref 12.0–46.0)
Lymphs Abs: 1.8 10*3/uL (ref 0.7–4.0)
MCHC: 32.4 g/dL (ref 30.0–36.0)
MCV: 97.3 fl (ref 78.0–100.0)
Monocytes Absolute: 0.4 10*3/uL (ref 0.1–1.0)
Monocytes Relative: 7 % (ref 3.0–12.0)
Neutro Abs: 2.9 10*3/uL (ref 1.4–7.7)
Neutrophils Relative %: 55.3 % (ref 43.0–77.0)
Platelets: 166 10*3/uL (ref 150.0–400.0)
RBC: 4.72 Mil/uL (ref 4.22–5.81)
RDW: 13.9 % (ref 11.5–15.5)
WBC: 5.2 10*3/uL (ref 4.0–10.5)

## 2023-06-02 LAB — LIPID PANEL
Cholesterol: 108 mg/dL (ref 0–200)
HDL: 49.8 mg/dL (ref >39.00)
LDL Cholesterol: 44 mg/dL (ref 0–99)
NonHDL: 57.88
Total CHOL/HDL Ratio: 2
Triglycerides: 70 mg/dL (ref 0.0–149.0)
VLDL: 14 mg/dL (ref 0.0–40.0)

## 2023-06-02 LAB — COMPREHENSIVE METABOLIC PANEL
ALT: 42 U/L (ref 0–53)
AST: 30 U/L (ref 0–37)
Albumin: 4.6 g/dL (ref 3.5–5.2)
Alkaline Phosphatase: 49 U/L (ref 39–117)
BUN: 16 mg/dL (ref 6–23)
CO2: 29 mEq/L (ref 19–32)
Calcium: 10 mg/dL (ref 8.4–10.5)
Chloride: 101 mEq/L (ref 96–112)
Creatinine, Ser: 0.86 mg/dL (ref 0.40–1.50)
GFR: 92.85 mL/min (ref >60.00)
Glucose, Bld: 138 mg/dL — ABNORMAL HIGH (ref 70–99)
Potassium: 4.3 mEq/L (ref 3.5–5.1)
Sodium: 138 mEq/L (ref 135–145)
Total Bilirubin: 0.8 mg/dL (ref 0.2–1.2)
Total Protein: 7.1 g/dL (ref 6.0–8.3)

## 2023-06-02 MED ORDER — METFORMIN HCL ER 500 MG PO TB24
500.0000 mg | ORAL_TABLET | Freq: Every day | ORAL | 1 refills | Status: DC
Start: 2023-06-02 — End: 2023-07-27

## 2023-06-02 NOTE — Assessment & Plan Note (Signed)
Well controlled, no changes to meds. Encouraged heart healthy diet such as the DASH diet and exercise as tolerated.  °

## 2023-06-02 NOTE — Progress Notes (Signed)
Established Patient Office Visit  Subjective   Patient ID: Michael Pearson, male    DOB: Jun 30, 1961  Age: 62 y.o. MRN: [id number]  Chief Complaint  Patient presents with   Diabetes   Hypertension   Hyperlipidemia   Follow-up    HPI Discussed the use of AI scribe software for clinical note transcription with the patient, who gave verbal consent to proceed.  History of Present Illness   The patient, with a history of diabetes, presents for a routine follow-up. He has been monitoring his blood glucose levels at home, with readings ranging from 90s to 324. The patient is unsure why the glucose levels have been so high, as his diet mainly consists of sandwiches from his garden. He has been seeing an endocrinologist for his diabetes management, with the last visit in June. The patient also reports taking his medication with water and fasting prior to the appointment in case blood work is needed.  In addition to diabetes, the patient has been dealing with hearing loss. He recently got a hearing aid, which has been working well, although it took some time to adjust to the increased volume of sounds. The patient also mentions needing refills for several medications that are due to run out next week. He also discusses his difficulty swallowing a pill that his brother also takes, requiring him to break it in half before consumption.      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) 12/30/2022   Blood clotting disorder (HCC) 12/30/2022   Primary osteoarthritis of both knees 06/25/2022   Suprapubic abdominal pain 12/25/2021   Dysphagia    Gastritis without bleeding    Hyperglycemia 07/29/2021   OSA (obstructive sleep apnea) 05/20/2021   Chronic pain of both knees 12/09/2020   Dizziness 07/31/2020   Cognitive communication deficit    Hypertension    CAD (coronary artery disease)    Tibial fracture 08/04/2018   Closed bicondylar fracture of  left tibial plateau 08/04/2018   Fracture 08/03/2018   Preventative health care 11/24/2017   Hyperprolactinemia (HCC) 07/28/2017   Pituitary adenoma (HCC) 07/28/2017   Chest pain 05/19/2016   Laceration of right index finger w/o foreign body w/o damage to nail 01/14/2016   Visit for suture removal 01/09/2016   GERD (gastroesophageal reflux disease) 12/04/2015   Headache 08/14/2015   History of diverticulitis of colon 03/30/2013   Acute diverticulitis 02/03/2013   Essential hypertension 02/03/2013   Chronic allergic rhinitis 11/03/2012   Heme positive stool 11/03/2012   Foot injury 05/15/2012   Upper airway cough syndrome p batter acid exp vs asthma  07/22/2011   Migraines 06/17/2011   Mild intermittent asthma 06/17/2011   Non-ST elevation myocardial infarction (NSTEMI) 06/07/2011   Atherosclerosis of native coronary artery of native heart with angina pectoris (HCC) 06/07/2011   Hyperlipidemia 06/07/2011   Past Medical History:  Diagnosis Date   Allergy    Arthritis    Asthma    CAD (coronary artery disease)    NSTEMI 7/12:  Cardiac cath on 7/17: pLAD occluded, Dx 30-40%, pRCA 30-40%, mRCA 30-40%.  Proximal LAD was treated with a BMS.  Echo 7/19:  EF 60-65%, mild LVH.    ETT-Myoview 6/14:  Inf thinning, no ischemia, EF 64%, normal study // Myoview 04/2020: EF 57, normal perfusion; Low Risk     Cognitive communication deficit    Colitis    Diverticulosis    Duodenitis    May  2012 with heme positive stools at this time. Endorses only rare streaking of blood now.   External hemorrhoids    GERD (gastroesophageal reflux disease)    Heart murmur    as a child per the pt   Hiatal hernia    Hx of hemorrhoids    Hyperlipidemia    Hypertension    Kidney stones    Microadenoma    MRI in 2008 demonstrating 4.6 mm area of pituitary gland    Migraines    Has been evaluated multiple times in past for chronic headache in which pt has had occasional nose bleeds and bloodshot eyes. This  lasted for 4-5 months. CT of the head was negative in May 2012.   Myocardial infarction (HCC) 05/17/2011   Pituitary tumor    RBBB (right bundle branch block)    Noted on EKG in 2008   Ulcer    Past Surgical History:  Procedure Laterality Date   BIOPSY  10/15/2021   Procedure: BIOPSY;  Surgeon: Beverley Fiedler, MD;  Location: [facility];  Service: Gastroenterology;;   COLONOSCOPY     egd  03/05/2011   Dr. Elnoria Howard   ESOPHAGOGASTRODUODENOSCOPY (EGD) WITH PROPOFOL N/A 10/15/2021   Procedure: ESOPHAGOGASTRODUODENOSCOPY (EGD) WITH PROPOFOL;  Surgeon: Beverley Fiedler, MD;  Location: [facility];  Service: Gastroenterology;  Laterality: N/A;  possible dilation   FLEXIBLE SIGMOIDOSCOPY  03/05/2011   Dr. Elnoria Howard   HAMMER TOE SURGERY     Heart stent     HERNIA REPAIR     In 20's   MALONEY DILATION  10/15/2021   Procedure: MALONEY DILATION;  Surgeon: Beverley Fiedler, MD;  Location: [facility];  Service: Gastroenterology;;  52cm    ORIF TIBIA PLATEAU Left 08/04/2018   Procedure: OPEN REDUCTION INTERNAL FIXATION (ORIF) TIBIAL PLATEAU;  Surgeon: Myrene Galas, MD;  Location: [facility];  Service: Orthopedics;  Laterality: Left;   SIGMOIDOSCOPY     TOOTH EXTRACTION     UPPER GASTROINTESTINAL ENDOSCOPY     Social History   Tobacco Use   Smoking status: Never   Smokeless tobacco: Never  Vaping Use   Vaping status: Never Used  Substance Use Topics   Alcohol use: No   Drug use: No   Social History   Socioeconomic History   Marital status: Single    Spouse name: Not on file   Number of children: Not on file   Years of education: Not on file   Highest education level: Not on file  Occupational History   Occupation: TEFL teacher-- cleans 3 buildings    Comment: Fairly physical job    Employer: [employer]  Tobacco Use   Smoking status: Never   Smokeless tobacco: Never  Vaping Use   Vaping status: Never Used  Substance and Sexual Activity   Alcohol use: No   Drug use: No   Sexual  activity: Not Currently  Other Topics Concern   Not on file  Social History Narrative   Lives with brother and mother   Mother is quite sick, he and his brother take turns taking care of her   No children   Social Determinants of Health   Financial Resource Strain: Low Risk  (01/25/2023)   Overall Financial Resource Strain (CARDIA)    Difficulty of Paying Living Expenses: Not very hard  Food Insecurity: No Food Insecurity (01/25/2023)   Hunger Vital Sign    Worried About Running Out of Food in the Last Year: Never true    Ran  Out of Food in the Last Year: Never true  Transportation Needs: No Transportation Needs (01/25/2023)   PRAPARE - Administrator, Civil Service (Medical): No    Lack of Transportation (Non-Medical): No  Physical Activity: Sufficiently Active (01/25/2023)   Exercise Vital Sign    Days of Exercise per Week: 6 days    Minutes of Exercise per Session: 30 min  Stress: No Stress Concern Present (07/09/2021)   [employer] of Occupational Health - Occupational Stress Questionnaire    Feeling of Stress : Not at all  Social Connections: Socially Isolated (01/25/2023)   Social Connection and Isolation Panel [NHANES]    Frequency of Communication with Friends and Family: More than three times a week    Frequency of Social Gatherings with Friends and Family: More than three times a week    Attends Religious Services: Never    Database administrator or Organizations: No    Attends Banker Meetings: Never    Marital Status: Never married  Intimate Partner Violence: Not At Risk (07/09/2021)   Humiliation, Afraid, Rape, and Kick questionnaire    Fear of Current or Ex-Partner: No    Emotionally Abused: No    Physically Abused: No    Sexually Abused: No   Family Status  Relation Name Status   Mother  Deceased       34 yoa   Father  Deceased at age 55   Brother  Alive   Sister  Deceased at age birth   Mat Aunt  Alive   PGM  (Not Specified)    Neg Hx  (Not Specified)  No partnership data on file   Family History  Problem Relation Age of Onset   Stroke Mother    Heart attack Father 44       MI   Skin cancer Father    Cancer Father        ? type "rare"   Obesity Brother    Coronary artery disease Brother        Possible, pt not sure of specifics   Hypertension Brother    Colon polyps Maternal Aunt    Colon cancer Maternal Aunt    Colon polyps Paternal Grandmother    Other Neg Hx    Esophageal cancer Neg Hx    Rectal cancer Neg Hx    Stomach cancer Neg Hx    No Known Allergies    ROS    Objective:     BP 110/80 (BP Location: Left Arm, Patient Position: Sitting, Cuff Size: Large)   Pulse 68   Temp 98.1 F (36.7 C) (Oral)   Resp 20   Ht 6\' 2"  (1.88 m)   Wt 269 lb 9.6 oz (122.3 kg)   SpO2 92%   BMI 34.61 kg/m  BP Readings from Last 3 Encounters:  06/02/23 110/80  05/25/23 115/70  04/05/23 120/78   Wt Readings from Last 3 Encounters:  06/02/23 269 lb 9.6 oz (122.3 kg)  05/25/23 271 lb 12.8 oz (123.3 kg)  04/05/23 273 lb (123.8 kg)   SpO2 Readings from Last 3 Encounters:  06/02/23 92%  05/25/23 95%  04/05/23 95%      Physical Exam Vitals and nursing note reviewed.  Constitutional:      General: He is not in acute distress.    Appearance: Normal appearance. He is well-developed.  HENT:     Head: Normocephalic and atraumatic.  Eyes:     General: No scleral icterus.  Right eye: No discharge.        Left eye: No discharge.  Cardiovascular:     Rate and Rhythm: Normal rate and regular rhythm.     Heart sounds: No murmur heard. Pulmonary:     Effort: Pulmonary effort is normal. No respiratory distress.     Breath sounds: Normal breath sounds.  Musculoskeletal:        General: Normal range of motion.     Cervical back: Normal range of motion and neck supple.     Right lower leg: No edema.     Left lower leg: No edema.  Skin:    General: Skin is warm and dry.  Neurological:      Mental Status: He is alert and oriented to person, place, and time.  Psychiatric:        Mood and Affect: Mood normal.        Behavior: Behavior normal.        Thought Content: Thought content normal.        Judgment: Judgment normal.      No results found for any visits on 06/02/23.  Last CBC Lab Results  Component Value Date   WBC 7.3 04/05/2023   HGB 15.3 04/05/2023   HCT 46.8 04/05/2023   MCV 97.2 04/05/2023   MCH 31.5 11/02/2022   RDW 13.9 04/05/2023   PLT 176.0 04/05/2023   Last metabolic panel Lab Results  Component Value Date   GLUCOSE 167 (H) 04/05/2023   NA 141 04/05/2023   K 4.2 04/05/2023   CL 102 04/05/2023   CO2 26 04/05/2023   BUN 15 04/05/2023   CREATININE 0.94 04/05/2023   GFR 87.03 04/05/2023   CALCIUM 10.1 04/05/2023   PHOS 3.9 08/05/2018   PROT 7.0 04/05/2023   ALBUMIN 4.4 04/05/2023   BILITOT 0.5 04/05/2023   ALKPHOS 48 04/05/2023   AST 35 04/05/2023   ALT 50 04/05/2023   ANIONGAP 7 11/02/2022   Last lipids Lab Results  Component Value Date   CHOL 104 04/05/2023   HDL 50.60 04/05/2023   LDLCALC 27 04/05/2023   TRIG 132.0 04/05/2023   CHOLHDL 2 04/05/2023   Last hemoglobin A1c Lab Results  Component Value Date   HGBA1C 8.1 (H) 04/05/2023   Last thyroid functions Lab Results  Component Value Date   TSH 1.75 03/31/2023   Last vitamin D Lab Results  Component Value Date   VD25OH 24.9 (L) 08/05/2018    The ASCVD Risk score (Arnett DK, et al., 2019) failed to calculate for the following reasons:   The patient has a prior MI or stroke diagnosis    Assessment & Plan:   Problem List Items Addressed This Visit       Unprioritized   Hyperprolactinemia (HCC)   Essential hypertension   Relevant Orders   Lipid panel   CBC with Differential/Platelet   Comprehensive metabolic panel   Pituitary adenoma (HCC)    Per endo      Hypertension    Well controlled, no changes to meds. Encouraged heart healthy diet such as the DASH  diet and exercise as tolerated.        Hyperlipidemia    Tolerating statin, encouraged heart healthy diet, avoid trans fats, minimize simple carbs and saturated fats. Increase exercise as tolerated       Relevant Orders   Lipid panel   CBC with Differential/Platelet   Comprehensive metabolic panel   Other Visit Diagnoses     Type 2 diabetes  mellitus with hyperglycemia, without long-term current use of insulin (HCC)    -  Primary   Relevant Medications   metFORMIN (GLUCOPHAGE-XR) 500 MG 24 hr tablet   Other Relevant Orders   Comprehensive metabolic panel     Assessment and Plan    Diabetes Mellitus Blood glucose levels fluctuating with a high of 324 and a low of 190. Patient is under the care of an endocrinologist and is taking Metformin. -Continue current management under endocrinologist. -Check blood glucose levels regularly and maintain a consistent diet.  Hearing Loss Recently fitted with a hearing aid, which is improving auditory function but is still adjusting to the increased volume. -Continue use of hearing aid and adjust as needed for comfort.  Medication Refills Several medications due to run out next week. -All medications have refills for a year. If any issues arise, patient to contact the office.  General Health Maintenance -Continue drinking bottled water for hydration. -Check-in with MyChart if access issues are resolved.        Return in about 6 months (around 12/03/2023), or if symptoms worsen or fail to improve, for annual exam, fasting.    Donato Schultz, DO

## 2023-06-02 NOTE — Patient Instructions (Signed)

## 2023-06-02 NOTE — Assessment & Plan Note (Signed)
Tolerating statin, encouraged heart healthy diet, avoid trans fats, minimize simple carbs and saturated fats. Increase exercise as tolerated 

## 2023-06-02 NOTE — Assessment & Plan Note (Signed)
Per endo °

## 2023-06-08 DIAGNOSIS — M792 Neuralgia and neuritis, unspecified: Secondary | ICD-10-CM | POA: Diagnosis not present

## 2023-06-08 DIAGNOSIS — G629 Polyneuropathy, unspecified: Secondary | ICD-10-CM | POA: Diagnosis not present

## 2023-06-08 DIAGNOSIS — E1165 Type 2 diabetes mellitus with hyperglycemia: Secondary | ICD-10-CM | POA: Diagnosis not present

## 2023-06-27 DIAGNOSIS — G4733 Obstructive sleep apnea (adult) (pediatric): Secondary | ICD-10-CM | POA: Diagnosis not present

## 2023-06-29 ENCOUNTER — Ambulatory Visit: Payer: Medicare HMO | Admitting: Pharmacist

## 2023-06-29 DIAGNOSIS — I10 Essential (primary) hypertension: Secondary | ICD-10-CM

## 2023-06-29 DIAGNOSIS — J4520 Mild intermittent asthma, uncomplicated: Secondary | ICD-10-CM

## 2023-06-29 DIAGNOSIS — E1165 Type 2 diabetes mellitus with hyperglycemia: Secondary | ICD-10-CM

## 2023-06-29 MED ORDER — RYBELSUS 14 MG PO TABS: 14.0000 mg | ORAL_TABLET | Freq: Every day | ORAL | 1 refills | Status: DC

## 2023-06-29 NOTE — Progress Notes (Signed)
Pharmacy Note  06/29/2023 Name:  Michael Pearson MRN:  [id number] DOB:  1961/09/29  Chief Complaint  Patient presents with   Diabetes   Medication Management   Asthma     Subjective: Michael Pearson is an 62 y.o. year old male who is a primary patient of Donato Schultz, DO.  The patient was referred to the Clinical Pharmacist Practitioner for assistance with diabetes and medication management.    Engaged with patient by telephone.   For follow up    Diabetes: Current medications: Rybelsus 7mg  each morning, metformin ER 500mg  daily  Started new dose of Rybelsus 7mg  around 04/12/2023   A1c prior to starting Rybelsus was 8.5%; Current A1c is 8.1%  Weight prior to starting Rybelsus was 293 lbs; Current weight = 269lbs Total weight loss 24lbs  Wt Readings from Last 3 Encounters:  06/02/23 269 lb 9.6 oz (122.3 kg)  05/25/23 271 lb 12.8 oz (123.3 kg)  04/05/23 273 lb (123.8 kg)   Current glucose readings checking 1 or 2 time per day Reports he sometimes has difficulty getting adequate blood sample for testing.   Fasting: 150 to 175 Post prandial:  221 to 287  Diet: drinking water more.  Has been eating low calorie, diet meals.  Not snacking between meals most day but sometimes will have an apple.  No candy or cookies. Rybelsus seems to be helping with appetite  Blood glucose when BMET was checked 06/02/2023 was 138   Exercise: a little walking daily but cannot walk to far, about 15 to 20 minutes. Stationary bike about 10 to 15 minutes.  Patient denies hypoglycemic s/sx including no dizziness, shakiness, sweating.  Patient reports hyperglycemic symptoms including polydipsia and polyuria, nocturia;  Denies polyphagia, neuropathy, blurred vision  Eye Exam scheduled for 07/04/2023 per patient  Hyperprolactinemia / pituitary adenoma: Had first visit with Dr Melvyn Novas on 03/30/2023 at [facility] f/u of pituitary adenoma. Several labs checked. F/U October 2024    Hypertension:  Current therapy - losartan 50mg  daily - LR was 04/04/2023 for 90 days Not checking blood pressure at home.   BP Readings from Last 3 Encounters:  06/02/23 110/80  05/25/23 115/70  04/05/23 120/78    Mild Intermittent Asthma:  Maintenance Inhaler: generic Wixela 250/50 mcg 1 inhalation twice a day  Rescue inhaler use - about 2 or 3 times per week.  Patient reported that the last time he picked up Wixela inhaler there was a cost but when I called [facility] they reported that cost was $0 for both generic Wixela and albuterol.   Objective:  Lab Results  Component Value Date   HGBA1C 8.1 (H) 04/05/2023    Lab Results  Component Value Date   CREATININE 0.86 06/02/2023   BUN 16 06/02/2023   NA 138 06/02/2023   K 4.3 06/02/2023   CL 101 06/02/2023   CO2 29 06/02/2023    Lab Results  Component Value Date   CHOL 108 06/02/2023   HDL 49.80 06/02/2023   LDLCALC 44 06/02/2023   TRIG 70.0 06/02/2023   CHOLHDL 2 06/02/2023    Medications Reviewed Today     Reviewed by Henrene Pastor, RPH-CPP (Pharmacist) on 06/29/23 at 1108  Med List Status: <None>   Medication Order Taking? Sig Documenting Provider Last Dose Status Informant  albuterol (VENTOLIN HFA) 108 (90 Base) MCG/ACT inhaler [id number] Yes Inhale 2 puffs into the lungs every 4 (four) hours as needed for wheezing or shortness of breath. (Rescue inhaler) Laury Axon  Irish Elders, DO Taking Active   aspirin EC 81 MG tablet [id number] Yes Take 81 mg by mouth in the morning. Swallow whole. [provider] Taking Active Self  atorvastatin (LIPITOR) 40 MG tablet [id number] Yes Take 1 tablet (40 mg total) by mouth daily. Donato Schultz, DO Taking Active   Blood Glucose Monitoring Suppl (ONETOUCH VERIO FLEX SYSTEM) w/Device KIT [id number] Yes Use to check blood glucose once a day (Dx: type 2 DM E11.65) Zola Button, Grayling Congress, DO Taking Active   cabergoline (DOSTINEX) 0.5 MG tablet [id number] Yes Take 0.5  tablets (0.25 mg total) by mouth 2 (two) times a week. Alt[place]e [place], MD Taking Active   cetirizine (ZYRTEC) 10 MG tablet [id number] Yes Take 10 mg by mouth daily as needed for allergies.  [provider] Taking Active Self  diclofenac Sodium (VOLTAREN) 1 % GEL [id number] No Apply 4 g topically 4 (four) times daily.  Patient not taking: Reported on 06/29/2023   Donato Schultz, DO Not Taking Active   docusate sodium (COLACE) 100 MG capsule [id number] Yes Take 100 mg by mouth 2 (two) times daily as needed (constipation.). [provider] Taking Active Self  esomeprazole (NEXIUM) 40 MG capsule [id number] Yes Take 1 capsule (40 mg total) by mouth 2 (two) times daily before a meal. Zola Button, Grayling Congress, DO Taking Active   ezetimibe (ZETIA) 10 MG tablet [id number] Yes Take 1 tablet (10 mg total) by mouth daily. Zola Button, Myrene Buddy R, DO Taking Active   fluticasone-salmeterol [facility] INHUB) 250-50 MCG/ACT AEPB [id number] Yes Inhale 1 puff into the lungs in the morning and at bedtime. Replaces Trelegy inhaler as Maintenance inhaler Donato Schultz, DO Taking Active   glucose blood (ONETOUCH VERIO) test strip [id number] Yes Use to check blood glucose once a day (Dx: type 2 DM E11.65) Donato Schultz, DO Taking Active   Lancets [facility] PLUS [place]) MISC [id number] Yes Use to check blood glucose once a day (Dx: type 2 DM E11.65) Zola Button, [place]ling [place], DO Taking Active   losartan (COZAAR) 50 MG tablet [id number] Yes Take 1 tablet (50 mg total) by mouth daily. Donato Schultz, DO Taking Active   meclizine (ANTIVERT) 25 MG tablet [id number] No Take 1 tablet (25 mg total) by mouth 3 (three) times daily as needed for dizziness.  Patient not taking: Reported on 06/29/2023   Wanda Plump, MD Not Taking Active   metFORMIN (GLUCOPHAGE-XR) 500 MG 24 hr tablet [id number] Yes Take 1 tablet (500 mg total) by mouth daily. Donato Schultz, DO Taking Active   Multiple Vitamin  (MULTIVITAMIN WITH MINERALS) TABS tablet [id number] Yes Take 1 tablet by mouth in the morning. [provider] Taking Active Self  Semaglutide (RYBELSUS) 7 MG TABS [id number] Yes Take 1 tablet (7 mg total) by mouth daily. Seabron Spates R, DO Taking Active   silver sulfADIAZINE (SILVADENE) 1 % cream [id number] Yes Apply 1 Application topically daily. Candelaria Stagers, DPM Taking Active             SDOH Interventions Today    Flowsheet Row Most Recent Value  SDOH Interventions   Physical Activity Interventions Intervention Not Indicated      Medication Assistance: Patient has qualified for Medicaid for 2024. Most medications $0   Assessment/Plan:   Diabetes: Not at A1c goal of < 7.0% - Reviewed goal A1c, goal fasting, and goal 2 hour post prandial glucose - continue metformin ER  500mg  daily-  - Increase Rybelsus to 14mg  once a day each morning. Reminded to take Rybelsus 30 minutes prior to breakfast prior to any medications, food or drink. He can take with water but no more than 4 ounces.    - Continue to check blood glucose once daily - varying time of day. Provided education about how to get a good blood sample for check blood glucose at home.  - Reviewed home blood glucose goals  Fasting blood glucose goal (before meals) = 80 to 130 Blood glucose goal after a meal = less than 180   Hypertension:  Continue losartan - called [facility] at patient request to ask for refill.   Asthma:  Education provided regarding using Wixela maintenance inhaler  - 1 puff twice a day (use every day). Requested refill from [facility] and verified cost is $0 Continue to use albuterol as needed.    Follow up 1 months to check blood glucose and med adherence.   Henrene Pastor, PharmD Clinical Pharmacist [facility]

## 2023-07-05 DIAGNOSIS — E119 Type 2 diabetes mellitus without complications: Secondary | ICD-10-CM | POA: Diagnosis not present

## 2023-07-07 ENCOUNTER — Encounter: Payer: Self-pay | Admitting: Family Medicine

## 2023-07-07 LAB — HM DIABETES EYE EXAM

## 2023-07-20 DIAGNOSIS — Z0100 Encounter for examination of eyes and vision without abnormal findings: Secondary | ICD-10-CM | POA: Diagnosis not present

## 2023-07-27 ENCOUNTER — Ambulatory Visit: Payer: Medicare HMO | Admitting: Pharmacist

## 2023-07-27 DIAGNOSIS — E1165 Type 2 diabetes mellitus with hyperglycemia: Secondary | ICD-10-CM

## 2023-07-27 DIAGNOSIS — K219 Gastro-esophageal reflux disease without esophagitis: Secondary | ICD-10-CM

## 2023-07-27 DIAGNOSIS — M170 Bilateral primary osteoarthritis of knee: Secondary | ICD-10-CM

## 2023-07-27 MED ORDER — METFORMIN HCL ER 500 MG PO TB24
1000.0000 mg | ORAL_TABLET | Freq: Every day | ORAL | 1 refills | Status: DC
Start: 2023-07-27 — End: 2023-09-07

## 2023-07-27 MED ORDER — FLUTICASONE-SALMETEROL 250-50 MCG/ACT IN AEPB: 1.0000 | INHALATION_SPRAY | Freq: Two times a day (BID) | RESPIRATORY_TRACT | 4 refills | Status: AC

## 2023-07-27 MED ORDER — ESOMEPRAZOLE MAGNESIUM 40 MG PO CPDR
40.0000 mg | DELAYED_RELEASE_CAPSULE | Freq: Two times a day (BID) | ORAL | 1 refills | Status: DC
Start: 2023-07-27 — End: 2024-01-18

## 2023-07-27 NOTE — Progress Notes (Signed)
Pharmacy Note  07/27/2023 Name:  Michael Pearson MRN:  [id number] DOB:  [birth year]  Chief Complaint  Patient presents with   Diabetes   Medication Management     Subjective: Michael Pearson is an 62 y.o. year old male who is a primary patient of Donato Schultz, DO.  The patient was referred to the Clinical Pharmacist Practitioner for assistance with diabetes and medication management.    Engaged with patient by telephone for follow up.   Diabetes: Current medications: Rybelsus 14mg  each morning, metformin ER 500mg  daily  Dose of Rybelsus increased to 14mg  at last visit 06/29/2023 however it looks like he did not fill new dose until 07/12/2023  A1c prior to starting Rybelsus was 8.5%; Lats A1c was 8.1%  Weight prior to starting Rybelsus was 293 lbs; Current weight = 269lbs Total weight loss 24lbs  Wt Readings from Last 3 Encounters:  06/02/23 269 lb 9.6 oz (122.3 kg)  05/25/23 271 lb 12.8 oz (123.3 kg)  04/05/23 273 lb (123.8 kg)   Current glucose readings checking 1 or 2 time per day.    Fasting: 179 to 194 Post prandial:  280 to 290 Blood glucose when BMET was checked 06/02/2023 was 138   Diet: drinking water more. Marland Kitchen He reports Rybelsus is helping with appetite. He has noticed that meal sizes are smaller.   Exercise: a little walking daily but cannot walk to far, about 15 to 20 minutes. Stationary bike about 10 to 15 minutes.  Patient denies hypoglycemic s/sx including no dizziness, shakiness, sweating.  Patient reports hyperglycemic symptoms including neuropathy and polyuria, nocturia;  Denies polyphagia, polydipsia, blurred vision Patient states he has been having more numbness in his toes and cold feet.   Hyperprolactinemia / pituitary adenoma: Had first visit with Dr Melvyn Novas on 03/30/2023 at [facility] f/u of pituitary adenoma. Several labs checked. F/U October 2024   Hypertension:  Current therapy - losartan 50mg  daily - LR was 07/12/2023 for 90  days Not checking blood pressure at home.   BP Readings from Last 3 Encounters:  06/02/23 110/80  05/25/23 115/70  04/05/23 120/78   Hyperlipidemia:  Current therapy: atorvastatin and ezetimibe daily - both last refilled 05/03/2023 for 90 DS Patient is tolerating statin well.   Mild Intermittent Asthma:  Maintenance Inhaler: generic Wixela 250/50 mcg 1 inhalation twice a day  Rescue inhaler use - about 1 or 2 times per week.   Objective:  Lab Results  Component Value Date   HGBA1C 8.1 (H) 04/05/2023    Lab Results  Component Value Date   CREATININE 0.86 06/02/2023   BUN 16 06/02/2023   NA 138 06/02/2023   K 4.3 06/02/2023   CL 101 06/02/2023   CO2 29 06/02/2023    Lab Results  Component Value Date   CHOL 108 06/02/2023   HDL 49.80 06/02/2023   LDLCALC 44 06/02/2023   TRIG 70.0 06/02/2023   CHOLHDL 2 06/02/2023    Medications Reviewed Today   Medications were not reviewed in this encounter       Medication Assistance: Patient has qualified for Medicaid for 2024. Most medications $0   Assessment/Plan:   Diabetes: Not at A1c goal of < 7.0% and home blood glucose has been elevated.  - Reviewed goal A1c, goal fasting, and goal 2 hour post prandial glucose - Increase metformin ER 500mg  to take 2 tablets = 1000mg  daily - Continue Rybelsus to 14mg  once a day each morning. Reminded to take Rybelsus  30 minutes prior to breakfast prior to any medications, food or drink. He can take with water but no more than 4 ounces.    - Continue to check blood glucose once daily - varying time of day.   - Discussed that numbness he is experiencing in his toes could be related to blood glucose. Recommended he contact PCP for evaluation and treatment. He declined appointment at this time but will call if symptoms do not improve. - Reviewed home blood glucose goals  Fasting blood glucose goal (before meals) = 80 to 130 Blood glucose goal after a meal = less than 180    Hypertension:  Continue losartan   Hyperlipidemia:  Continue atorvastatin and ezetimibe - reminded patient that next refill due soon. He requested refills from [facility]   Asthma:  Continue to use Wixela - encouraged him to use every day. Continue to use albuterol as needed.   Meds ordered this encounter  Medications   metFORMIN (GLUCOPHAGE-XR) 500 MG 24 hr tablet    Sig: Take 2 tablets (1,000 mg total) by mouth daily.    Dispense:  180 tablet    Refill:  1    Please cancel any previous prescriptions - dose has been increased. Fill / Hold based on patient's refill schedule.   fluticasone-salmeterol (WIXELA INHUB) 250-50 MCG/ACT AEPB    Sig: Inhale 1 puff into the lungs in the morning and at bedtime. Replaces Trelegy inhaler as Maintenance inhaler    Dispense:  60 each    Refill:  4   esomeprazole (NEXIUM) 40 MG capsule    Sig: Take 1 capsule (40 mg total) by mouth 2 (two) times daily before a meal.    Dispense:  180 capsule    Refill:  1     Follow up 1 months to check blood glucose and med adherence.   Henrene Pastor, PharmD Clinical Pharmacist [facility]

## 2023-08-08 ENCOUNTER — Telehealth: Payer: Self-pay

## 2023-08-08 NOTE — Patient Outreach (Signed)
Care Management   Outreach Note  08/08/2023 Name: Michael Pearson MRN: [id number] DOB: [birth year]  An unsuccessful telephone outreach was attempted today to contact the patient about Care Management needs.     Follow Up Plan:  A HIPAA compliant phone message was left for the patient providing contact information and requesting a return call.    [facility]ina [facility]  [facility], [facility] RN Care Manager Direct Dial: [phone] Website: [email or web address]

## 2023-08-15 ENCOUNTER — Telehealth: Payer: Self-pay

## 2023-08-15 NOTE — Patient Outreach (Signed)
Care Management   Outreach Note  08/15/2023 Name: Michael Pearson MRN: [id number] DOB: 1961/06/22  An unsuccessful telephone outreach was attempted today to contact the patient about Care Management needs.     Follow Up Plan:  A HIPAA compliant phone message was left for the patient providing contact information and requesting a return call.    [facility]ina [facility]  [facility], [facility] RN Care Manager Direct Dial: [phone] Website: [email or web address]

## 2023-08-24 ENCOUNTER — Other Ambulatory Visit: Payer: Medicare HMO

## 2023-08-24 DIAGNOSIS — D352 Benign neoplasm of pituitary gland: Secondary | ICD-10-CM

## 2023-08-25 LAB — PROLACTIN: Prolactin: 8.4 ng/mL (ref 2.0–18.0)

## 2023-08-29 ENCOUNTER — Telehealth: Payer: Self-pay

## 2023-08-29 NOTE — Patient Outreach (Signed)
Care Management   Outreach Note  08/29/2023 Name: Michael Pearson MRN: [id number] DOB: [birth year]  An third unsuccessful telephone outreach was attempted today to contact the patient about Care Management needs.   The team has been unable to engage and maintain contact with [name].   Follow Up Plan:  No further outreach attempts will be made. The Care Management team will gladly engage for future care management needs.   [facility]ina [facility]  [facility], [facility] RN Care Manager Direct Dial: [phone] Website: [email or web address]

## 2023-08-31 ENCOUNTER — Ambulatory Visit (INDEPENDENT_AMBULATORY_CARE_PROVIDER_SITE_OTHER): Payer: Medicare HMO | Admitting: "Endocrinology

## 2023-08-31 ENCOUNTER — Encounter: Payer: Self-pay | Admitting: "Endocrinology

## 2023-08-31 VITALS — BP 142/80 | HR 92 | Ht 74.0 in | Wt 268.6 lb

## 2023-08-31 DIAGNOSIS — D352 Benign neoplasm of pituitary gland: Secondary | ICD-10-CM | POA: Diagnosis not present

## 2023-08-31 MED ORDER — CABERGOLINE 0.5 MG PO TABS: 0.2500 mg | ORAL_TABLET | ORAL | 3 refills | Status: DC

## 2023-08-31 NOTE — Progress Notes (Signed)
Outpatient Endocrinology Note Michael Garrett, MD    Michael Pearson [birth year] [id number]  Referring Provider: Zola Pearson, Michael Pearson, * Primary Care Provider: Zola Pearson, Michael Congress, DO Reason for consultation: Subjective   Assessment & Plan  Diagnoses and all orders for this visit:  Pituitary adenoma (HCC) -     Prolactin; Future  Other orders -     cabergoline (DOSTINEX) 0.5 MG tablet; Take 0.5 tablets (0.25 mg total) by mouth 2 (two) times a week.    On Cabergoline - takes 1 tablet (0.5 mg total) by mouth 2 (two) times a week, Prolactin at 6.3 Denies any S/E On Cabergoline - takes 1/2 tablet (0.25 mg total) by mouth 2 (two) times a week, Prolactin at 8.4 09/14/2020  MRI HEAD WITHOUT AND WITH CONTRAST COMPARISON:  MRI of the brain June 14, 2019.  IMPRESSION: Stable 7 mm pituitary microadenoma to the right of midline.  Baseline 8 am pituitary labs WNL -has normal sleep cycle  Repeat prolactin in 59mo Ordered MRI brain, not done, reinforced  Followed up with eye doctor 2024  Return in about 4 months (around 12/30/2023) for visit and 8 am labs before next visit.   I have reviewed current medications, nurse's notes, allergies, vital signs, past medical and surgical history, family medical history, and social history for this encounter. Counseled patient on symptoms, examination findings, lab findings, imaging results, treatment decisions and monitoring and prognosis. The patient understood the recommendations and agrees with the treatment plan. All questions regarding treatment plan were fully answered.  Michael Bassett, MD  08/31/23   History of Present Illness HPI  Michael Pearson is a 62 y.o. year old male who returns for f/u of pituitary microprolactinoma (in 2005, pt was incidentally noted to have a pituitary adenoma; in 2017, elev. prolactin was noted; f/u MRI in 2020 showed adenoma was down to 7 mm 2021 showed no change; h/o near-syncope limits cabergoltine dosage;  other pituitary functions are normal).    Mild head aches after supper some days Has nauseous sometimes, no vomiting Had chronic lightheadedness if gets up fast-sometimes Saw eye doctor, wears glasses  Has been trying to lose weight Reports sometimes having abdominal pain   Gets tired really quick, and intermittent constipation Denies heat intolerance  Denies breast discharge  On Cabergoline - takes 1 tablet (0.5 mg total) by mouth 2 (two) times a week. Denies any S/E  09/14/2020  MRI HEAD WITHOUT AND WITH CONTRAST COMPARISON:  MRI of the brain June 14, 2019.   IMPRESSION: Stable 7 mm pituitary microadenoma to the right of midline.   Physical Exam  BP (!) 142/80   Pulse 92   Ht 6\' 2"  (1.88 m)   Wt 268 lb 9.6 oz (121.8 kg)   SpO2 95%   BMI 34.49 kg/m    Constitutional: well developed, well nourished Head: normocephalic, atraumatic Eyes: sclera anicteric, no redness Neck: supple Lungs: normal respiratory effort Neurology: alert and oriented Skin: dry, no appreciable rashes Musculoskeletal: no appreciable defects Psychiatric: normal mood and affect   Current Medications Patient's Medications  New Prescriptions   CABERGOLINE (DOSTINEX) 0.5 MG TABLET    Take 0.5 tablets (0.25 mg total) by mouth 2 (two) times a week.  Previous Medications   ALBUTEROL (VENTOLIN HFA) 108 (90 BASE) MCG/ACT INHALER    Inhale 2 puffs into the lungs every 4 (four) hours as needed for wheezing or shortness of breath. (Rescue inhaler)   ASPIRIN EC 81 MG TABLET  Take 81 mg by mouth in the morning. Swallow whole.   ATORVASTATIN (LIPITOR) 40 MG TABLET    Take 1 tablet (40 mg total) by mouth daily.   BLOOD GLUCOSE MONITORING SUPPL (ONETOUCH VERIO FLEX SYSTEM) W/DEVICE KIT    Use to check blood glucose once a day (Dx: type 2 DM E11.65)   CETIRIZINE (ZYRTEC) 10 MG TABLET    Take 10 mg by mouth daily as needed for allergies.    DICLOFENAC SODIUM (VOLTAREN) 1 % GEL    Apply 4 g topically 4 (four)  times daily.   DOCUSATE SODIUM (COLACE) 100 MG CAPSULE    Take 100 mg by mouth 2 (two) times daily as needed (constipation.).   ESOMEPRAZOLE (NEXIUM) 40 MG CAPSULE    Take 1 capsule (40 mg total) by mouth 2 (two) times daily before a meal.   EZETIMIBE (ZETIA) 10 MG TABLET    Take 1 tablet (10 mg total) by mouth daily.   FLUTICASONE-SALMETEROL (WIXELA INHUB) 250-50 MCG/ACT AEPB    Inhale 1 puff into the lungs in the morning and at bedtime. Replaces Trelegy inhaler as Maintenance inhaler   GLUCOSE BLOOD (ONETOUCH VERIO) TEST STRIP    Use to check blood glucose once a day (Dx: type 2 DM E11.65)   LANCETS (ONETOUCH DELICA PLUS LANCET33G) MISC    Use to check blood glucose once a day (Dx: type 2 DM E11.65)   LOSARTAN (COZAAR) 50 MG TABLET    Take 1 tablet (50 mg total) by mouth daily.   MECLIZINE (ANTIVERT) 25 MG TABLET    Take 1 tablet (25 mg total) by mouth 3 (three) times daily as needed for dizziness.   METFORMIN (GLUCOPHAGE-XR) 500 MG 24 HR TABLET    Take 2 tablets (1,000 mg total) by mouth daily.   MULTIPLE VITAMIN (MULTIVITAMIN WITH MINERALS) TABS TABLET    Take 1 tablet by mouth in the morning.   SEMAGLUTIDE (RYBELSUS) 14 MG TABS    Take 1 tablet (14 mg total) by mouth daily. Take each morning, 30 minutes before ingesting food, drinks, and other medications. Take with no more than 4 oz of plain water.   SILVER SULFADIAZINE (SILVADENE) 1 % CREAM    Apply 1 Application topically daily.  Modified Medications   No medications on file  Discontinued Medications   No medications on file    Allergies No Known Allergies  Past Medical History Past Medical History:  Diagnosis Date   Allergy    Arthritis    Asthma    CAD (coronary artery disease)    NSTEMI 7/12:  Cardiac cath on 7/17: pLAD occluded, Dx 30-40%, pRCA 30-40%, mRCA 30-40%.  Proximal LAD was treated with a BMS.  Echo 7/19:  EF 60-65%, mild LVH.    ETT-Myoview 6/14:  Inf thinning, no ischemia, EF 64%, normal study // Myoview 04/2020: EF  57, normal perfusion; Low Risk     Cognitive communication deficit    Colitis    Diverticulosis    Duodenitis    May 2012 with heme positive stools at this time. Endorses only rare streaking of blood now.   External hemorrhoids    GERD (gastroesophageal reflux disease)    Heart murmur    as a child per the pt   Hiatal hernia    Hx of hemorrhoids    Hyperlipidemia    Hypertension    Kidney stones    Microadenoma    MRI in 2008 demonstrating 4.6 mm area of pituitary gland  Migraines    Has been evaluated multiple times in past for chronic headache in which pt has had occasional nose bleeds and bloodshot eyes. This lasted for 4-5 months. CT of the head was negative in May 2012.   Myocardial infarction (HCC) 05/17/2011   Pituitary tumor    RBBB (right bundle branch block)    Noted on EKG in 2008   Ulcer     Past Surgical History Past Surgical History:  Procedure Laterality Date   BIOPSY  10/15/2021   Procedure: BIOPSY;  Surgeon: Beverley Fiedler, MD;  Location: [facility];  Service: Gastroenterology;;   COLONOSCOPY     egd  03/05/2011   Dr. Elnoria Howard   ESOPHAGOGASTRODUODENOSCOPY (EGD) WITH PROPOFOL N/A 10/15/2021   Procedure: ESOPHAGOGASTRODUODENOSCOPY (EGD) WITH PROPOFOL;  Surgeon: Beverley Fiedler, MD;  Location: [facility];  Service: Gastroenterology;  Laterality: N/A;  possible dilation   FLEXIBLE SIGMOIDOSCOPY  03/05/2011   Dr. Elnoria Howard   HAMMER TOE SURGERY     Heart stent     HERNIA REPAIR     In 20's   MALONEY DILATION  10/15/2021   Procedure: MALONEY DILATION;  Surgeon: Beverley Fiedler, MD;  Location: [facility];  Service: Gastroenterology;;  52cm    ORIF TIBIA PLATEAU Left 08/04/2018   Procedure: OPEN REDUCTION INTERNAL FIXATION (ORIF) TIBIAL PLATEAU;  Surgeon: Myrene Galas, MD;  Location: [facility] OR;  Service: Orthopedics;  Laterality: Left;   SIGMOIDOSCOPY     TOOTH EXTRACTION     UPPER GASTROINTESTINAL ENDOSCOPY      Family History family history includes Cancer in his  father; Colon cancer in his maternal aunt; Colon polyps in his maternal aunt and paternal grandmother; Coronary artery disease in his brother; Heart attack (age of onset: 86) in his father; Hypertension in his brother; Obesity in his brother; Skin cancer in his father; Stroke in his mother.  Social History Social History   Socioeconomic History   Marital status: Single    Spouse name: Not on file   Number of children: Not on file   Years of education: Not on file   Highest education level: Not on file  Occupational History   Occupation: TEFL teacher-- cleans 3 buildings    Comment: Fairly physical job    Employer: [employer]  Tobacco Use   Smoking status: Never   Smokeless tobacco: Never  Vaping Use   Vaping status: Never Used  Substance and Sexual Activity   Alcohol use: No   Drug use: No   Sexual activity: Not Currently  Other Topics Concern   Not on file  Social History Narrative   Lives with brother and mother   Mother is quite sick, he and his brother take turns taking care of her   No children   Social Determinants of Health   Financial Resource Strain: Low Risk  (01/25/2023)   Overall Financial Resource Strain (CARDIA)    Difficulty of Paying Living Expenses: Not very hard  Food Insecurity: No Food Insecurity (01/25/2023)   Hunger Vital Sign    Worried About Running Out of Food in the Last Year: Never true    Ran Out of Food in the Last Year: Never true  Transportation Needs: No Transportation Needs (01/25/2023)   PRAPARE - Administrator, Civil Service (Medical): No    Lack of Transportation (Non-Medical): No  Physical Activity: Sufficiently Active (06/29/2023)   Exercise Vital Sign    Days of Exercise per Week: 6 days  Minutes of Exercise per Session: 30 min  Stress: No Stress Concern Present (07/09/2021)   [employer] of Occupational Health - Occupational Stress Questionnaire    Feeling of Stress : Not at all  Social Connections:  Socially Isolated (01/25/2023)   Social Connection and Isolation Panel [NHANES]    Frequency of Communication with Friends and Family: More than three times a week    Frequency of Social Gatherings with Friends and Family: More than three times a week    Attends Religious Services: Never    Database administrator or Organizations: No    Attends Banker Meetings: Never    Marital Status: Never married  Intimate Partner Violence: Not At Risk (07/09/2021)   Humiliation, Afraid, Rape, and Kick questionnaire    Fear of Current or Ex-Partner: No    Emotionally Abused: No    Physically Abused: No    Sexually Abused: No    Lab Results  Component Value Date   CHOL 108 06/02/2023   Lab Results  Component Value Date   HDL 49.80 06/02/2023   Lab Results  Component Value Date   LDLCALC 44 06/02/2023   Lab Results  Component Value Date   TRIG 70.0 06/02/2023   Lab Results  Component Value Date   CHOLHDL 2 06/02/2023   Lab Results  Component Value Date   CREATININE 0.86 06/02/2023   Lab Results  Component Value Date   GFR 92.85 06/02/2023      Component Value Date/Time   NA 138 06/02/2023 1007   K 4.3 06/02/2023 1007   CL 101 06/02/2023 1007   CO2 29 06/02/2023 1007   GLUCOSE 138 (H) 06/02/2023 1007   BUN 16 06/02/2023 1007   CREATININE 0.86 06/02/2023 1007   CALCIUM 10.0 06/02/2023 1007   CALCIUM 8.6 (L) 08/05/2018 0514   PROT 7.1 06/02/2023 1007   ALBUMIN 4.6 06/02/2023 1007   AST 30 06/02/2023 1007   ALT 42 06/02/2023 1007   ALKPHOS 49 06/02/2023 1007   BILITOT 0.8 06/02/2023 1007   GFRNONAA >60 11/02/2022 1545   GFRAA >60 08/05/2018 0514      Latest Ref Rng & Units 06/02/2023   10:07 AM 04/05/2023    3:37 PM 03/31/2023    7:53 AM  BMP  Glucose 70 - 99 mg/dL 409  811  914   BUN 6 - 23 mg/dL 16  15  17    Creatinine 0.40 - 1.50 mg/dL 7.82  9.56  2.13   Sodium 135 - 145 mEq/L 138  141  140   Potassium 3.5 - 5.1 mEq/L 4.3  4.2  4.1   Chloride 96 - 112  mEq/L 101  102  102   CO2 19 - 32 mEq/L 29  26  27    Calcium 8.4 - 10.5 mg/dL 08.6  57.8  9.8        Component Value Date/Time   WBC 5.2 06/02/2023 1007   RBC 4.72 06/02/2023 1007   HGB 14.9 06/02/2023 1007   HCT 46.0 06/02/2023 1007   PLT 166.0 06/02/2023 1007   MCV 97.3 06/02/2023 1007   MCH 31.5 11/02/2022 1545   MCHC 32.4 06/02/2023 1007   RDW 13.9 06/02/2023 1007   LYMPHSABS 1.8 06/02/2023 1007   MONOABS 0.4 06/02/2023 1007   EOSABS 0.1 06/02/2023 1007   BASOSABS 0.0 06/02/2023 1007   Lab Results  Component Value Date   TSH 1.75 03/31/2023   TSH 0.91 12/30/2022   TSH 1.64 06/25/2022  FREET4 0.80 03/31/2023   FREET4 0.74 07/29/2021   FREET4 0.76 07/16/2020         Parts of this note may have been dictated using voice recognition software. There may be variances in spelling and vocabulary which are unintentional. Not all errors are proofread. Please notify the Thereasa Parkin if any discrepancies are noted or if the meaning of any statement is not clear.

## 2023-09-05 ENCOUNTER — Telehealth: Payer: Self-pay | Admitting: Family Medicine

## 2023-09-05 MED ORDER — RYBELSUS 14 MG PO TABS: 14.0000 mg | ORAL_TABLET | Freq: Every day | ORAL | 1 refills | Status: DC

## 2023-09-05 NOTE — Telephone Encounter (Signed)
Refill sent.

## 2023-09-05 NOTE — Telephone Encounter (Signed)
Medication: Semaglutide (RYBELSUS) 14 MG TABS  Has the patient contacted their pharmacy? Yes.    Preferred Pharmacy:  [id number] [place], [place] - [address] [place] [address] Phone: [phone]  Fax: [phone]

## 2023-09-07 ENCOUNTER — Ambulatory Visit: Payer: Medicare HMO | Admitting: Pharmacist

## 2023-09-07 VITALS — BP 128/75 | HR 73

## 2023-09-07 DIAGNOSIS — E1165 Type 2 diabetes mellitus with hyperglycemia: Secondary | ICD-10-CM

## 2023-09-07 DIAGNOSIS — E785 Hyperlipidemia, unspecified: Secondary | ICD-10-CM

## 2023-09-07 DIAGNOSIS — J4520 Mild intermittent asthma, uncomplicated: Secondary | ICD-10-CM

## 2023-09-07 DIAGNOSIS — I10 Essential (primary) hypertension: Secondary | ICD-10-CM

## 2023-09-07 MED ORDER — METFORMIN HCL ER 500 MG PO TB24: 1000.0000 mg | ORAL_TABLET | Freq: Two times a day (BID) | ORAL | 1 refills | Status: DC

## 2023-09-07 NOTE — Progress Notes (Signed)
Pharmacy Note  09/07/2023 Name:  TAIYO KOZMA MRN:  [id number] DOB:  1961-03-31  Chief Complaint  Patient presents with   Diabetes   Hypertension     Subjective: Michael Pearson is an 62 y.o. year old male who is a primary patient of Donato Schultz, DO.  The patient was referred to the Clinical Pharmacist Practitioner for assistance with diabetes and medication management.    Engaged with patient by telephone for follow up.   Diabetes: Current medications: Rybelsus 14mg  each morning, metformin ER 500mg  twice a day  Dose of Rybelsus increased to 14mg  on 06/29/2023 however it looks like he did not fill new dose until 07/12/2023 Dose of metformin was increase at visit in September 2024  A1c prior to starting Rybelsus was 8.5%; Lats A1c was 8.1%  Weight prior to starting Rybelsus was 293 lbs; Current weight = 268lbs Total weight loss 25lbs  Wt Readings from Last 3 Encounters:  08/31/23 268 lb 9.6 oz (121.8 kg)  06/02/23 269 lb 9.6 oz (122.3 kg)  05/25/23 271 lb 12.8 oz (123.3 kg)   Current glucose readings checking 1 or 2 time per day.    Fasting: 179 to 194 Post prandial:  200 to 250  Diet:  drinking water more. He has been using garlic to season his foods. Trying to increase intake of vegetables.  He reports Rybelsus is helping with appetite. He has noticed that meal sizes are smaller.   Exercise: a little walking daily but cannot walk to far, about 15 to 20 minutes. Stationary bike about 10 to 15 minutes.  Patient denies hypoglycemic s/sx including no dizziness, shakiness, sweating.  Patient reports hyperglycemic symptoms including neuropathy and polyuria, nocturia;  Denies polyphagia, polydipsia, blurred vision  Eye Exam - was completed 07/06/2023 - no retinopathy noted. However still noted in health maintenance that eye exam has never been completed. There is documentation of eye exam in Media tab   Hyperprolactinemia / pituitary adenoma: Last visit with Dr  Roosevelt Locks was 08/31/2023 - does of cabergoline was continued at 0.25mg  twice per week  Hypertension:  Current therapy - losartan 50mg  daily - LR was 07/12/2023 for 90 days He has automatic blood pressure cuff at home. Today blood pressure was 128/75 and HR 73  BP Readings from Last 3 Encounters:  09/07/23 128/75  08/31/23 (!) 142/80  06/02/23 110/80   Hyperlipidemia:  Current therapy: atorvastatin and ezetimibe daily - both last refilled 07/26/2023 for 90 DS Patient is tolerating statin well.   Mild Intermittent Asthma:  Maintenance Inhaler: generic Wixela 250/50 mcg 1 inhalation twice a day  Rescue inhaler use - about 1 or 2 times per week.   Objective:  Lab Results  Component Value Date   HGBA1C 8.1 (H) 04/05/2023    Lab Results  Component Value Date   CREATININE 0.86 06/02/2023   BUN 16 06/02/2023   NA 138 06/02/2023   K 4.3 06/02/2023   CL 101 06/02/2023   CO2 29 06/02/2023    Lab Results  Component Value Date   CHOL 108 06/02/2023   HDL 49.80 06/02/2023   LDLCALC 44 06/02/2023   TRIG 70.0 06/02/2023   CHOLHDL 2 06/02/2023    Medications Reviewed Today     Reviewed by Henrene Pastor, RPH-CPP (Pharmacist) on 09/07/23 at 1306  Med List Status: <None>   Medication Order Taking? Sig Documenting Provider Last Dose Status Informant  albuterol (VENTOLIN HFA) 108 (90 Base) MCG/ACT inhaler [id number] Yes Inhale 2 puffs  into the lungs every 4 (four) hours as needed for wheezing or shortness of breath. (Rescue inhaler) Donato Schultz, DO Taking Active   aspirin EC 81 MG tablet [id number] Yes Take 81 mg by mouth in the morning. Swallow whole. [provider] Taking Active Self  atorvastatin (LIPITOR) 40 MG tablet [id number] Yes Take 1 tablet (40 mg total) by mouth daily. Donato Schultz, DO Taking Active   Blood Glucose Monitoring Suppl (ONETOUCH VERIO FLEX SYSTEM) w/Device KIT [id number]  Use to check blood glucose once a day (Dx: type 2 DM E11.65) Zola Button, Grayling Congress, DO  Active   cabergoline (DOSTINEX) 0.5 MG tablet [id number] Yes Take 0.5 tablets (0.25 mg total) by mouth 2 (two) times a week. Altamese [place], MD Taking Active   cetirizine (ZYRTEC) 10 MG tablet [id number]  Take 10 mg by mouth daily as needed for allergies.  [provider]  Active Self  diclofenac Sodium (VOLTAREN) 1 % GEL [id number]  Apply 4 g topically 4 (four) times daily. Seabron Spates R, DO  Active   docusate sodium (COLACE) 100 MG capsule [id number] Yes Take 100 mg by mouth 2 (two) times daily as needed (constipation.). [provider] Taking Active Self  esomeprazole (NEXIUM) 40 MG capsule [id number] Yes Take 1 capsule (40 mg total) by mouth 2 (two) times daily before a meal. Zola Button, Grayling [place], DO Taking Active   ezetimibe (ZETIA) 10 MG tablet [id number] Yes Take 1 tablet (10 mg total) by mouth daily. Zola Button, Myrene Buddy R, DO Taking Active   fluticasone-salmeterol [facility] INHUB) 250-50 MCG/ACT AEPB [id number] Yes Inhale 1 puff into the lungs in the morning and at bedtime. Replaces Trelegy inhaler as Maintenance inhaler Donato Schultz, DO Taking Active   glucose blood (ONETOUCH VERIO) test strip [id number] Yes Use to check blood glucose once a day (Dx: type 2 DM E11.65) Donato Schultz, DO Taking Active   Lancets [facility] [place] PLUS [place]) MISC [id number]  Use to check blood glucose once a day (Dx: type 2 DM E11.65) Zola Button, Grayling [place], DO  Active   losartan (COZAAR) 50 MG tablet [id number] Yes Take 1 tablet (50 mg total) by mouth daily. Donato Schultz, DO Taking Active   meclizine (ANTIVERT) 25 MG tablet [id number]  Take 1 tablet (25 mg total) by mouth 3 (three) times daily as needed for dizziness.  Patient not taking: Reported on 06/29/2023   Wanda Plump, MD  Active   metFORMIN (GLUCOPHAGE-XR) 500 MG 24 hr tablet [id number] Yes Take 2 tablets (1,000 mg total) by mouth daily.  Patient taking differently: Take 500 mg by mouth 2  (two) times daily with a meal.   Wanda Plump, MD Taking Active   Multiple Vitamin (MULTIVITAMIN WITH MINERALS) TABS tablet [id number] Yes Take 1 tablet by mouth in the morning. [provider] Taking Active Self  Semaglutide (RYBELSUS) 14 MG TABS [id number] Yes Take 1 tablet (14 mg total) by mouth daily. Take each morning, 30 minutes before ingesting food, drinks, and other medications. Take with no more than 4 oz of plain water. Seabron Spates R, DO Taking Active   silver sulfADIAZINE (SILVADENE) 1 % cream [id number]  Apply 1 Application topically daily. Candelaria Stagers, DPM  Active               Medication Assistance: Patient has qualified for Medicaid for 2024. Most medications $0   Assessment/Plan:   Diabetes: Not at A1c  goal of < 7.0% and home blood glucose has been elevated.  - Reviewed goal A1c, goal fasting, and goal 2 hour post prandial glucose - Increase metformin ER 500mg  to take 2 tablets = 1000mg  twice a day - updated Rx sent to pharmacy - Continue Rybelsus to 14mg  once a day each morning. Reminded to take Rybelsus 30 minutes prior to breakfast prior to any medications, food or drink. He can take with water but no more than 4 ounces.  - Forwarded request to update HM regarding eye exam to Weldon Picking, RN   - Continue to check blood glucose once daily - varying time of day.  . - Reviewed home blood glucose goals  Fasting blood glucose goal (before meals) = 80 to 130 Blood glucose goal after a meal = less than 180   Hypertension - home blood pressure was at goal today but last blood pressure at endo office was slightly elevated.  Continue losartan  Continue to check blood pressure at home 2 or 3 times per week Reviewed blood pressure goal - < 130/80  Hyperlipidemia:  Continue atorvastatin and ezetimibe   Asthma:  Continue to use Wixela - encouraged him to use every day. Continue to use albuterol as needed.   No orders of the defined types were placed in  this encounter.    Follow up 1 months to check blood glucose and med adherence.  Patient to see PCP in Feb 2025  Henrene Pastor, PharmD Clinical Pharmacist [facility]

## 2023-09-14 ENCOUNTER — Ambulatory Visit (HOSPITAL_COMMUNITY)
Admission: RE | Admit: 2023-09-14 | Discharge: 2023-09-14 | Disposition: A | Discharge status: Home / Self Care | Payer: Medicare HMO | Source: Ambulatory Visit | Attending: "Endocrinology | Admitting: "Endocrinology

## 2023-09-14 DIAGNOSIS — D332 Benign neoplasm of brain, unspecified: Secondary | ICD-10-CM | POA: Diagnosis not present

## 2023-09-14 DIAGNOSIS — D352 Benign neoplasm of pituitary gland: Secondary | ICD-10-CM | POA: Insufficient documentation

## 2023-09-14 MED ORDER — GADOBUTROL 1 MMOL/ML IV SOLN
10.0000 mL | Freq: Once | INTRAVENOUS | Status: AC | PRN
  Administered 2023-09-14: 10 mL via INTRAVENOUS

## 2023-09-15 DIAGNOSIS — Z008 Encounter for other general examination: Secondary | ICD-10-CM | POA: Diagnosis not present

## 2023-09-19 ENCOUNTER — Encounter: Payer: Self-pay | Admitting: Internal Medicine

## 2023-09-27 NOTE — Telephone Encounter (Signed)
[employer] notes are in Media

## 2023-10-12 ENCOUNTER — Ambulatory Visit (INDEPENDENT_AMBULATORY_CARE_PROVIDER_SITE_OTHER): Payer: Medicare HMO | Admitting: Pharmacist

## 2023-10-12 DIAGNOSIS — J4520 Mild intermittent asthma, uncomplicated: Secondary | ICD-10-CM

## 2023-10-12 DIAGNOSIS — I10 Essential (primary) hypertension: Secondary | ICD-10-CM

## 2023-10-12 DIAGNOSIS — E1165 Type 2 diabetes mellitus with hyperglycemia: Secondary | ICD-10-CM

## 2023-10-12 DIAGNOSIS — E785 Hyperlipidemia, unspecified: Secondary | ICD-10-CM

## 2023-10-12 NOTE — Progress Notes (Signed)
Pharmacy Note  10/12/2023 Name:  Michael Pearson MRN:  [id number] DOB:  1961-07-27  Chief Complaint  Patient presents with   Diabetes     Subjective: Michael Pearson is an 62 y.o. year old male who is a primary patient of Donato Schultz, DO.  The patient was referred to the Clinical Pharmacist Practitioner for assistance with diabetes and medication management.    Engaged with patient by telephone for follow up.   Diabetes: Current medications: Rybelsus 14mg  each morning, metformin ER 500mg  twice a day  Dose of Rybelsus increased to 14mg  on 06/29/2023. Patient endorses today he has been taking Rybelsus according to recommendations - on an empty stomach with a sip of water each morning.  Dose of metformin was increase at visit in September 2024  A1c prior to starting Rybelsus was 8.5%; Lats A1c was 8.1%  Weight prior to starting Rybelsus was 293 lbs; Current weight = 268lbs Total weight loss 25lbs  Wt Readings from Last 3 Encounters:  08/31/23 268 lb 9.6 oz (121.8 kg)  06/02/23 269 lb 9.6 oz (122.3 kg)  05/25/23 271 lb 12.8 oz (123.3 kg)   Current glucose readings checking 1 or 2 times per day.    Fasting: 91 to 150 Post prandial:  140 to 199  Diet:  drinking water more. He has been using garlic to season his foods. Trying to increase intake of vegetables.  He reports Rybelsus is helping with appetite.  Exercise: a little walking daily but cannot walk to far, about 15 to 20 minutes. Stationary bike about 10 to 15 minutes.  Patient denies hypoglycemic s/sx including no dizziness, shakiness, sweating.  Patient reports hyperglycemic symptoms including neuropathy and polyuria, nocturia;  Denies polyphagia, polydipsia, blurred vision  Eye Exam - was completed 07/06/2023 - no retinopathy noted.  Influenza vaccine was given 09/20/2023 per [facility]   Hyperprolactinemia / pituitary adenoma: Last visit with Dr Roosevelt Locks was 08/31/2023 - dose of cabergoline was continued at  0.25mg  twice per week  Hypertension:  Current therapy - losartan 50mg  daily - LR was 07/12/2023 for 90 days -  patient reports he has one week of losartan remaining.  He has automatic blood pressure cuff at home. Today blood pressure was 120/72 and HR 69  BP Readings from Last 3 Encounters:  09/07/23 128/75  08/31/23 (!) 142/80  06/02/23 110/80   Hyperlipidemia:  Current therapy: atorvastatin and ezetimibe daily - both last refilled 07/26/2023 for 90 DS Patient is tolerating statin well.   Mild Intermittent Asthma:  Maintenance Inhaler: generic Wixela 250/50 mcg 1 inhalation twice a day  Rescue inhaler use - albuterol - hasn't needed recently  Objective:  Lab Results  Component Value Date   HGBA1C 8.1 (H) 04/05/2023    Lab Results  Component Value Date   CREATININE 0.86 06/02/2023   BUN 16 06/02/2023   NA 138 06/02/2023   K 4.3 06/02/2023   CL 101 06/02/2023   CO2 29 06/02/2023    Lab Results  Component Value Date   CHOL 108 06/02/2023   HDL 49.80 06/02/2023   LDLCALC 44 06/02/2023   TRIG 70.0 06/02/2023   CHOLHDL 2 06/02/2023    Medications Reviewed Today     Reviewed by Henrene Pastor, RPH-CPP (Pharmacist) on 10/12/23 at 1152  Med List Status: <None>   Medication Order Taking? Sig Documenting Provider Last Dose Status Informant  albuterol (VENTOLIN HFA) 108 (90 Base) MCG/ACT inhaler [id number] Yes Inhale 2 puffs into the lungs every 4 (  four) hours as needed for wheezing or shortness of breath. (Rescue inhaler) Donato Schultz, DO Taking Active   aspirin EC 81 MG tablet [id number] Yes Take 81 mg by mouth in the morning. Swallow whole. [provider] Taking Active Self  atorvastatin (LIPITOR) 40 MG tablet [id number] Yes Take 1 tablet (40 mg total) by mouth daily. Seabron Spates R, DO Taking Active   Blood Glucose Monitoring Suppl (ONETOUCH VERIO FLEX SYSTEM) w/Device KIT [id number] Yes Use to check blood glucose once a day (Dx: type 2 DM E11.65)  Zola Button, Grayling [place], DO Taking Active   cabergoline (DOSTINEX) 0.5 MG tablet [id number] Yes Take 0.5 tablets (0.25 mg total) by mouth 2 (two) times a week. Altamese [place], MD Taking Active   cetirizine (ZYRTEC) 10 MG tablet [id number] Yes Take 10 mg by mouth daily as needed for allergies.  [provider] Taking Active Self  diclofenac Sodium (VOLTAREN) 1 % GEL [id number]  Apply 4 g topically 4 (four) times daily. Seabron Spates R, DO  Active   docusate sodium (COLACE) 100 MG capsule [id number]  Take 100 mg by mouth 2 (two) times daily as needed (constipation.). [provider]  Active Self  esomeprazole (NEXIUM) 40 MG capsule [id number] Yes Take 1 capsule (40 mg total) by mouth 2 (two) times daily before a meal. Zola Button, Grayling [place], DO Taking Active   ezetimibe (ZETIA) 10 MG tablet [id number] Yes Take 1 tablet (10 mg total) by mouth daily. Zola Button, Myrene Buddy R, DO Taking Active   fluticasone-salmeterol [facility] INHUB) 250-50 MCG/ACT AEPB [id number] Yes Inhale 1 puff into the lungs in the morning and at bedtime. Replaces Trelegy inhaler as Maintenance inhaler Donato Schultz, DO Taking Active   glucose blood (ONETOUCH VERIO) test strip [id number] Yes Use to check blood glucose once a day (Dx: type 2 DM E11.65) Donato Schultz, DO Taking Active   Lancets [facility] [place] PLUS [place]) MISC [id number] Yes Use to check blood glucose once a day (Dx: type 2 DM E11.65) Zola Button, [place], DO Taking Active   losartan (COZAAR) 50 MG tablet [id number] Yes Take 1 tablet (50 mg total) by mouth daily. Donato Schultz, DO Taking Active   meclizine (ANTIVERT) 25 MG tablet [id number]  Take 1 tablet (25 mg total) by mouth 3 (three) times daily as needed for dizziness.  Patient not taking: Reported on 06/29/2023   Wanda Plump, MD  Active   metFORMIN (GLUCOPHAGE-XR) 500 MG 24 hr tablet [id number] Yes Take 2 tablets (1,000 mg total) by mouth 2 (two) times daily with a meal. Zola Button, Grayling [place], DO Taking Active   Multiple Vitamin (MULTIVITAMIN WITH MINERALS) TABS tablet [id number] Yes Take 1 tablet by mouth in the morning. [provider] Taking Active Self  Semaglutide (RYBELSUS) 14 MG TABS [id number] Yes Take 1 tablet (14 mg total) by mouth daily. Take each morning, 30 minutes before ingesting food, drinks, and other medications. Take with no more than 4 oz of plain water. Seabron Spates R, DO Taking Active   silver sulfADIAZINE (SILVADENE) 1 % cream [id number] Yes Apply 1 Application topically daily. Candelaria Stagers, DPM Taking Active               Medication Assistance: Patient has qualified for Medicaid for 2024. Most medications $0   Assessment/Plan:   Diabetes: Not at A1c goal of < 7.0% and home blood glucose has been elevated.  - Reviewed goal A1c,  goal fasting, and goal 2 hour post prandial glucose - Continue metformin ER 500mg  take 2 tablets = 1000mg  twice a day - Continue Rybelsus to 14mg  once a day each morning. Reminded to take Rybelsus 30 minutes prior to breakfast prior to any medications, food or drink. He can take with water but no more than 4 ounces.  - Continue to check blood glucose once daily - varying time of day.  . - Reviewed home blood glucose goals  Fasting blood glucose goal (before meals) = 80 to 130 Blood glucose goal after a meal = less than 180   Hypertension - home blood pressure was at goal today Continue losartan  Continue to check blood pressure at home 2 or 3 times per week Reviewed blood pressure goal - < 130/80  Hyperlipidemia:  Continue atorvastatin and ezetimibe   Asthma:  Continue to use Wixela - encouraged him to use every day. Continue to use albuterol as needed.   Health Maintenance:  Reviewed vaccination history. Requested vaccination update for influenza vaccine from [facility] Made 6 month follow up appt with PCP today.   Follow up: 3 to 4 months to check adherence / follow up DM  Henrene Pastor, PharmD Clinical Pharmacist [facility]

## 2023-11-09 ENCOUNTER — Other Ambulatory Visit: Payer: Self-pay | Admitting: Family Medicine

## 2023-11-10 ENCOUNTER — Telehealth: Payer: Self-pay

## 2023-11-10 NOTE — Telephone Encounter (Signed)
 PA approved. Effective 11/02/23 to 11/09/24

## 2023-11-10 NOTE — Telephone Encounter (Signed)
 PA initiated via Covermymeds; KEY [id number]. Awaiting determination

## 2023-11-28 ENCOUNTER — Other Ambulatory Visit: Payer: Self-pay

## 2023-11-28 DIAGNOSIS — D352 Benign neoplasm of pituitary gland: Secondary | ICD-10-CM

## 2023-12-02 ENCOUNTER — Other Ambulatory Visit: Payer: No Typology Code available for payment source

## 2023-12-02 LAB — PROLACTIN: Prolactin: 7.8 ng/mL (ref 2.0–18.0)

## 2023-12-11 ENCOUNTER — Encounter (HOSPITAL_COMMUNITY): Payer: Self-pay

## 2023-12-11 ENCOUNTER — Emergency Department (HOSPITAL_COMMUNITY): Payer: No Typology Code available for payment source

## 2023-12-11 ENCOUNTER — Other Ambulatory Visit: Payer: Self-pay

## 2023-12-11 ENCOUNTER — Emergency Department (HOSPITAL_COMMUNITY)
Admission: EM | Admit: 2023-12-11 | Discharge: 2023-12-11 | Disposition: A | Discharge status: Home / Self Care | Payer: No Typology Code available for payment source | Attending: Emergency Medicine | Admitting: Emergency Medicine

## 2023-12-11 DIAGNOSIS — Z7982 Long term (current) use of aspirin: Secondary | ICD-10-CM | POA: Insufficient documentation

## 2023-12-11 DIAGNOSIS — I2510 Atherosclerotic heart disease of native coronary artery without angina pectoris: Secondary | ICD-10-CM | POA: Insufficient documentation

## 2023-12-11 DIAGNOSIS — R079 Chest pain, unspecified: Secondary | ICD-10-CM | POA: Diagnosis not present

## 2023-12-11 DIAGNOSIS — I10 Essential (primary) hypertension: Secondary | ICD-10-CM | POA: Diagnosis not present

## 2023-12-11 DIAGNOSIS — R0789 Other chest pain: Secondary | ICD-10-CM | POA: Diagnosis not present

## 2023-12-11 DIAGNOSIS — Z79899 Other long term (current) drug therapy: Secondary | ICD-10-CM | POA: Insufficient documentation

## 2023-12-11 LAB — BASIC METABOLIC PANEL
Anion gap: 14 (ref 5–15)
BUN: 14 mg/dL (ref 8–23)
CO2: 21 mmol/L — ABNORMAL LOW (ref 22–32)
Calcium: 10.1 mg/dL (ref 8.9–10.3)
Chloride: 103 mmol/L (ref 98–111)
Creatinine, Ser: 0.83 mg/dL (ref 0.61–1.24)
GFR, Estimated: >60 mL/min (ref >60)
Glucose, Bld: 138 mg/dL — ABNORMAL HIGH (ref 70–99)
Potassium: 4 mmol/L (ref 3.5–5.1)
Sodium: 138 mmol/L (ref 135–145)

## 2023-12-11 LAB — URINALYSIS, ROUTINE W REFLEX MICROSCOPIC
Bacteria, UA: NONE SEEN
Bilirubin Urine: NEGATIVE
Glucose, UA: NEGATIVE mg/dL
Hgb urine dipstick: NEGATIVE
Ketones, ur: 5 mg/dL — AB
Leukocytes,Ua: NEGATIVE
Nitrite: NEGATIVE
Protein, ur: 30 mg/dL — AB
Specific Gravity, Urine: 1.025 (ref 1.005–1.030)
pH: 5 (ref 5.0–8.0)

## 2023-12-11 LAB — CBC
HCT: 47 % (ref 39.0–52.0)
Hemoglobin: 15.1 g/dL (ref 13.0–17.0)
MCH: 31.1 pg (ref 26.0–34.0)
MCHC: 32.1 g/dL (ref 30.0–36.0)
MCV: 96.9 fL (ref 80.0–100.0)
Platelets: 179 10*3/uL (ref 150–400)
RBC: 4.85 MIL/uL (ref 4.22–5.81)
RDW: 13.3 % (ref 11.5–15.5)
WBC: 10 10*3/uL (ref 4.0–10.5)
nRBC: 0 % (ref 0.0–0.2)

## 2023-12-11 LAB — TROPONIN I (HIGH SENSITIVITY)
Troponin I (High Sensitivity): 3 ng/L (ref <18)
Troponin I (High Sensitivity): <2 ng/L (ref <18)

## 2023-12-11 LAB — CBG MONITORING, ED: Glucose-Capillary: 141 mg/dL — ABNORMAL HIGH (ref 70–99)

## 2023-12-11 MED ORDER — FAMOTIDINE 20 MG PO TABS
20.0000 mg | ORAL_TABLET | Freq: Once | ORAL | Status: AC
  Administered 2023-12-11: 20 mg via ORAL
  Filled 2023-12-11: qty 1

## 2023-12-11 MED ORDER — ASPIRIN 81 MG PO CHEW
324.0000 mg | CHEWABLE_TABLET | Freq: Once | ORAL | Status: AC
  Administered 2023-12-11: 324 mg via ORAL
  Filled 2023-12-11: qty 4

## 2023-12-11 MED ORDER — LIDOCAINE VISCOUS HCL 2 % MT SOLN
15.0000 mL | Freq: Once | OROMUCOSAL | Status: AC
  Administered 2023-12-11: 15 mL via ORAL
  Filled 2023-12-11: qty 15

## 2023-12-11 MED ORDER — ALUM & MAG HYDROXIDE-SIMETH 200-200-20 MG/5ML PO SUSP
30.0000 mL | Freq: Once | ORAL | Status: AC
  Administered 2023-12-11: 30 mL via ORAL
  Filled 2023-12-11: qty 30

## 2023-12-11 NOTE — ED Triage Notes (Signed)
 Pt reports with sharp chest pain that goes from his jaw down the center of his chest. Pt reports having sharp pains in both arms.

## 2023-12-11 NOTE — ED Provider Notes (Signed)
  Physical Exam  BP (!) 148/90 (BP Location: Left Arm)   Pulse 100   Temp 98.4 F (36.9 C) (Oral)   Resp 19   Ht 6' 2 (1.88 m)   Wt 112.5 kg   SpO2 97%   BMI 31.84 kg/m   Physical Exam  Procedures  Procedures  ED Course / MDM   Clinical Course as of 12/11/23 1648  Sun Dec 11, 2023  1543 Initial troponin of 3.  Will obtain delta troponin.  No evidence of acute ischemia on EKG.  Chest x-ray looks clear.  No other significant findings on CBC or metabolic panel.  Awaiting UA.  Patient hemodynamically stable resting comfortably at this time  I, Ozell Marine DO, am transitioning care of this patient to the oncoming provider pending delta troponin, UA and disposition [MP]    Clinical Course User Index [MP] Marine Ozell LABOR, DO   Medical Decision Making Care assumed at 3 PM.  Patient is here with chest pain.  Had CAD but stents in 2012 but has been having normal stress test in 2022.  Patient is to eat some pizza and hot dog and then had chest pain.  Suspect GI in nature.  Signout pending urinalysis and repeat troponin  4:49 PM Urinalysis is normal repeat troponin is negative.  Patient is feeling better after GI cocktail and Pepcid .  He states that he can follow-up with his primary care doctor and  Problems Addressed: Chest pain, unspecified type: acute illness or injury  Amount and/or Complexity of Data Reviewed Labs: ordered. Decision-making details documented in ED Course. Radiology: ordered and independent interpretation performed. Decision-making details documented in ED Course.  Risk OTC drugs. Prescription drug management.          Patt Alm Macho, MD 12/11/23 [id number]

## 2023-12-11 NOTE — Discharge Instructions (Addendum)
 You were seen in the emerged from for chest pain Your blood work EKG and chest x-ray looked okay Your urine test did not show any evidence of urinary tract infection We do not feel the chest pain you are having is related to a heart attack at this time We are not certain what is causing your chest pain but it is important that you continue taking all previously prescribed indications and follow-up with your primary care doctor within 1 week for reevaluation Continue taking Nexium  and take Pepcid  as needed.  Please avoid spicy food or fatty foods Return to the emergency department for chest pain trouble breathing or any other concerns

## 2023-12-11 NOTE — ED Provider Notes (Signed)
 [place] EMERGENCY DEPARTMENT AT [facility] Provider Note   CSN: [id number] Arrival date & time: 12/11/23  1224     History  Chief Complaint  Patient presents with   Chest Pain    Michael Pearson is a 63 y.o. male.  With a history of NSTEMI, hyperlipidemia hypertension, CAD and GERD who presents to ED for chest pain.  Patient first experienced chest pain earlier this morning that is persistent since the onset.  Pain over the substernal region with radiation to the jaw and both upper extremities.  No nausea vomiting diaphoresis or shortness of breath.  Patient suggests the pain may be related to acid reflux as he had pizza and hot dogs last night but was worried it could be related to his heart.  No fevers chills recent illness.  He mentions secondary complaint of foul-smelling urine but no dysuria or increased urinary frequency.  No prior history of UTIs.   Chest Pain      Home Medications Prior to Admission medications   Medication Sig Start Date End Date Taking? Authorizing Provider  albuterol  (VENTOLIN  HFA) 108 (90 Base) MCG/ACT inhaler Inhale 2 puffs into the lungs every 4 (four) hours as needed for wheezing or shortness of breath. (Rescue inhaler) 01/24/23 10/12/27  Antonio Meth, [name], DO  aspirin  EC 81 MG tablet Take 81 mg by mouth in the morning. Swallow whole.    [provider]  atorvastatin  (LIPITOR) 40 MG tablet Take 1 tablet (40 mg total) by mouth daily. 01/24/23   Antonio Meth [name], DO  Blood Glucose Monitoring Suppl (ONETOUCH VERIO FLEX SYSTEM) w/Device KIT Use to check blood glucose once a day (Dx: type 2 DM E11.65) 02/23/23   Antonio Meth, [name], DO  cabergoline  (DOSTINEX ) 0.5 MG tablet Take 0.5 tablets (0.25 mg total) by mouth 2 (two) times a week. 09/01/23   Motwani, Komal, MD  cetirizine  (ZYRTEC ) 10 MG tablet Take 10 mg by mouth daily as needed for allergies.     [provider]  diclofenac  Sodium (VOLTAREN ) 1 % GEL Apply 4 g  topically 4 (four) times daily. 06/25/22   Antonio Meth [name], DO  docusate sodium  (COLACE) 100 MG capsule Take 100 mg by mouth 2 (two) times daily as needed (constipation.).    [provider]  esomeprazole  (NEXIUM ) 40 MG capsule Take 1 capsule (40 mg total) by mouth 2 (two) times daily before a meal. 07/27/23   Antonio Meth, [name], DO  ezetimibe  (ZETIA ) 10 MG tablet Take 1 tablet (10 mg total) by mouth daily. 01/24/23   Antonio Meth [name], DO  fluticasone -salmeterol (WIXELA INHUB) 250-50 MCG/ACT AEPB Inhale 1 puff into the lungs in the morning and at bedtime. Replaces Trelegy inhaler as Maintenance inhaler 07/27/23   Antonio Meth, [name], DO  glucose blood (ONETOUCH VERIO) test strip Use to check blood glucose once a day (Dx: type 2 DM E11.65) 02/23/23   Antonio Meth, [name], DO  Lancets [facility] DELICA PLUS [place]) MISC Use to check blood glucose once a day (Dx: type 2 DM E11.65) 02/23/23   Antonio Meth, [name], DO  losartan  (COZAAR ) 50 MG tablet Take 1 tablet (50 mg total) by mouth daily. 01/10/23   Antonio Meth [name], DO  meclizine  (ANTIVERT ) 25 MG tablet Take 1 tablet (25 mg total) by mouth 3 (three) times daily as needed for dizziness. Patient not taking: Reported on 06/29/2023 08/19/22   Amon Aloysius BRAVO, MD  metFORMIN  (  GLUCOPHAGE -XR) 500 MG 24 hr tablet Take 2 tablets (1,000 mg total) by mouth 2 (two) times daily with a meal. 09/07/23   Antonio Meth, [name], DO  Multiple Vitamin (MULTIVITAMIN WITH MINERALS) TABS tablet Take 1 tablet by mouth in the morning.    [provider]  RYBELSUS  14 MG TABS TAKE 1 TABLET BY MOUTH EVERY MORNING 30 MINUTES BEFORE FOOD, DRINKS, OR OTHER MEDS. TAKE WITH NO MORE THAN 4 OZ WATER 11/09/23   Antonio Meth, Jamee R, DO  silver  sulfADIAZINE  (SILVADENE ) 1 % cream Apply 1 Application topically daily. 03/18/23   Tobie Franky SQUIBB, DPM      Allergies    Patient has no known allergies.    Review of Systems   Review of Systems  Cardiovascular:   Positive for chest pain.    Physical Exam Updated Vital Signs BP (!) 148/90 (BP Location: Left Arm)   Pulse 100   Temp 98.4 F (36.9 C) (Oral)   Resp 18   Ht 6' 2 (1.88 m)   Wt 112.5 kg   SpO2 97%   BMI 31.84 kg/m  Physical Exam Vitals and nursing note reviewed.  HENT:     Head: Normocephalic and atraumatic.  Eyes:     Pupils: Pupils are equal, round, and reactive to light.  Cardiovascular:     Rate and Rhythm: Normal rate and regular rhythm.  Pulmonary:     Effort: Pulmonary effort is normal.     Breath sounds: Normal breath sounds.  Abdominal:     Palpations: Abdomen is soft.     Tenderness: There is no abdominal tenderness.  Skin:    General: Skin is warm and dry.  Neurological:     General: No focal deficit present.     Mental Status: He is alert.     Motor: No weakness.  Psychiatric:        Mood and Affect: Mood normal.     ED Results / Procedures / Treatments   Labs (all labs ordered are listed, but only abnormal results are displayed) Labs Reviewed  BASIC METABOLIC PANEL - Abnormal; Notable for the following components:      Result Value   CO2 21 (*)    Glucose, Bld 138 (*)    All other components within normal limits  URINALYSIS, ROUTINE W REFLEX MICROSCOPIC - Abnormal; Notable for the following components:   Ketones, ur 5 (*)    Protein, ur 30 (*)    All other components within normal limits  CBG MONITORING, ED - Abnormal; Notable for the following components:   Glucose-Capillary 141 (*)    All other components within normal limits  CBC  TROPONIN I (HIGH SENSITIVITY)  TROPONIN I (HIGH SENSITIVITY)    EKG EKG Interpretation Date/Time:  Sunday December 11 2023 12:54:41 EST Ventricular Rate:  102 PR Interval:  178 QRS Duration:  140 QT Interval:  361 QTC Calculation: 471 R Axis:   86  Text Interpretation: Sinus tachycardia Right bundle branch block Confirmed by Pamella Sharper [phone]) on 12/11/2023 1:57:56 PM  Radiology DG Chest 2  View Result Date: 12/11/2023 CLINICAL DATA:  Chest pain. EXAM: CHEST - 2 VIEW COMPARISON:  12/30/2022 FINDINGS: Lungs are hyperexpanded. The lungs are clear without focal pneumonia, edema, pneumothorax or pleural effusion. Stable linear densities at the lung bases compatible with chronic atelectasis or scarring. The cardiopericardial silhouette is within normal limits for size. No acute bony abnormality. Telemetry leads overlie the chest. IMPRESSION: Hyperexpansion without acute cardiopulmonary findings. Electronically  Signed   By: Camellia Candle M.D.   On: 12/11/2023 13:47    Procedures Procedures    Medications Ordered in ED Medications  alum & mag hydroxide-simeth (MAALOX/MYLANTA) 200-200-20 MG/5ML suspension 30 mL (has no administration in time range)    And  lidocaine  (XYLOCAINE ) 2 % viscous mouth solution 15 mL (has no administration in time range)  aspirin  chewable tablet 324 mg (324 mg Oral Given 12/11/23 1432)    ED Course/ Medical Decision Making/ A&P Clinical Course as of 12/11/23 1605  Sun Dec 11, 2023  1543 Initial troponin of 3.  Will obtain delta troponin.  No evidence of acute ischemia on EKG.  Chest x-ray looks clear.  No other significant findings on CBC or metabolic panel.  Awaiting UA.  Patient hemodynamically stable resting comfortably at this time  I, Ozell Marine DO, am transitioning care of this patient to the oncoming provider pending delta troponin, UA and disposition [MP]    Clinical Course User Index [MP] Marine Ozell LABOR, DO                                 Medical Decision Making 63 year old male with extensive cardiac history as above presenting for chest pain.  Pain began this morning persisted since the onset.  Afebrile slightly hypertensive here.  Benign physical exam with no abdominal tenderness or focal neurologic deficit.  Differential diagnosis includes ACS, dysrhythmia, pneumonia, acid reflux.  Will obtain cardiac workup including labs, high-sensitivity  troponin, chest x-ray and EKG and continue to monitor on cardiac monitor.  Will obtain UA to look for evidence of UTI given complaint of malodorous urine.  Will provide with 324 mg aspirin   Amount and/or Complexity of Data Reviewed Labs: ordered. Radiology: ordered.  Risk OTC drugs. Prescription drug management.           Final Clinical Impression(s) / ED Diagnoses Final diagnoses:  Chest pain, unspecified type    Rx / DC Orders ED Discharge Orders     None         Marine Ozell LABOR, DO 12/11/23 1605

## 2023-12-13 ENCOUNTER — Encounter: Payer: Self-pay | Admitting: Family Medicine

## 2023-12-13 ENCOUNTER — Ambulatory Visit: Payer: No Typology Code available for payment source | Admitting: Family Medicine

## 2023-12-13 VITALS — BP 114/82 | HR 76 | Temp 98.2°F | Resp 18 | Ht 74.0 in | Wt 257.8 lb

## 2023-12-13 DIAGNOSIS — R41841 Cognitive communication deficit: Secondary | ICD-10-CM

## 2023-12-13 DIAGNOSIS — Z7984 Long term (current) use of oral hypoglycemic drugs: Secondary | ICD-10-CM

## 2023-12-13 DIAGNOSIS — Z23 Encounter for immunization: Secondary | ICD-10-CM | POA: Diagnosis not present

## 2023-12-13 DIAGNOSIS — J4520 Mild intermittent asthma, uncomplicated: Secondary | ICD-10-CM

## 2023-12-13 DIAGNOSIS — R8290 Unspecified abnormal findings in urine: Secondary | ICD-10-CM | POA: Diagnosis not present

## 2023-12-13 DIAGNOSIS — E785 Hyperlipidemia, unspecified: Secondary | ICD-10-CM

## 2023-12-13 DIAGNOSIS — I25119 Atherosclerotic heart disease of native coronary artery with unspecified angina pectoris: Secondary | ICD-10-CM

## 2023-12-13 DIAGNOSIS — I10 Essential (primary) hypertension: Secondary | ICD-10-CM

## 2023-12-13 DIAGNOSIS — E221 Hyperprolactinemia: Secondary | ICD-10-CM

## 2023-12-13 DIAGNOSIS — E1165 Type 2 diabetes mellitus with hyperglycemia: Secondary | ICD-10-CM

## 2023-12-13 LAB — CBC WITH DIFFERENTIAL/PLATELET
Basophils Absolute: 0 10*3/uL (ref 0.0–0.1)
Basophils Relative: 0.7 % (ref 0.0–3.0)
Eosinophils Absolute: 0.1 10*3/uL (ref 0.0–0.7)
Eosinophils Relative: 2.2 % (ref 0.0–5.0)
HCT: 46 % (ref 39.0–52.0)
Hemoglobin: 15.2 g/dL (ref 13.0–17.0)
Lymphocytes Relative: 32.7 % (ref 12.0–46.0)
Lymphs Abs: 2 10*3/uL (ref 0.7–4.0)
MCHC: 33 g/dL (ref 30.0–36.0)
MCV: 97.4 fL (ref 78.0–100.0)
Monocytes Absolute: 0.5 10*3/uL (ref 0.1–1.0)
Monocytes Relative: 8.7 % (ref 3.0–12.0)
Neutro Abs: 3.5 10*3/uL (ref 1.4–7.7)
Neutrophils Relative %: 55.7 % (ref 43.0–77.0)
Platelets: 190 10*3/uL (ref 150.0–400.0)
RBC: 4.73 Mil/uL (ref 4.22–5.81)
RDW: 14.1 % (ref 11.5–15.5)
WBC: 6.2 10*3/uL (ref 4.0–10.5)

## 2023-12-13 LAB — COMPREHENSIVE METABOLIC PANEL
ALT: 33 U/L (ref 0–53)
AST: 24 U/L (ref 0–37)
Albumin: 4.6 g/dL (ref 3.5–5.2)
Alkaline Phosphatase: 49 U/L (ref 39–117)
BUN: 12 mg/dL (ref 6–23)
CO2: 32 meq/L (ref 19–32)
Calcium: 9.7 mg/dL (ref 8.4–10.5)
Chloride: 100 meq/L (ref 96–112)
Creatinine, Ser: 0.76 mg/dL (ref 0.40–1.50)
GFR: 96.03 mL/min (ref >60.00)
Glucose, Bld: 105 mg/dL — ABNORMAL HIGH (ref 70–99)
Potassium: 4.5 meq/L (ref 3.5–5.1)
Sodium: 140 meq/L (ref 135–145)
Total Bilirubin: 0.6 mg/dL (ref 0.2–1.2)
Total Protein: 7.2 g/dL (ref 6.0–8.3)

## 2023-12-13 LAB — LIPID PANEL
Cholesterol: 96 mg/dL (ref 0–200)
HDL: 50.3 mg/dL (ref >39.00)
LDL Cholesterol: 29 mg/dL (ref 0–99)
NonHDL: 45.68
Total CHOL/HDL Ratio: 2
Triglycerides: 82 mg/dL (ref 0.0–149.0)
VLDL: 16.4 mg/dL (ref 0.0–40.0)

## 2023-12-13 MED ORDER — METFORMIN HCL ER 500 MG PO TB24
1000.0000 mg | ORAL_TABLET | Freq: Two times a day (BID) | ORAL | 1 refills | Status: DC
  Filled 2024-04-25: qty 360, 90d supply, fill #0
  Filled 2024-09-11: qty 360, 90d supply, fill #1

## 2023-12-13 NOTE — Assessment & Plan Note (Signed)
Well controlled, no changes to meds. Encouraged heart healthy diet such as the DASH diet and exercise as tolerated.

## 2023-12-13 NOTE — Assessment & Plan Note (Signed)
Cont' statin Check labs

## 2023-12-13 NOTE — Assessment & Plan Note (Signed)
stable

## 2023-12-13 NOTE — Patient Instructions (Signed)
GERD in Adults: Diet Changes When you have gastroesophageal reflux disease (GERD), you may need to make changes to your diet. Choosing the right foods can help with your symptoms. Think about working with an expert in healthy eating called a dietitian. They can help you make healthy food choices. What are tips for following this plan? Reading food labels Look for foods that are low in saturated fat. Foods that may help with your symptoms include: Foods with less than 5% of daily value (DV) of fat. Foods with 0 grams of trans fat. Cooking [employer] in ways that don't use a lot of fat. These ways include: Baking. Steaming. Grilling. Broiling. To add flavor, try to use herbs that are low in spice and acidity. Avoid frying your food. Meal planning  Eat small meals often rather than eating 3 large meals each day. Eat your meals slowly in a place where you feel relaxed. If told by your health care provider, avoid: Foods that cause symptoms. Keep a food diary to keep track of foods that cause symptoms. Alcohol. Drinking a lot of liquid with meals. General instructions For 2-3 hours after you eat, avoid: Bending over. Exercise. Lying down. Chew sugar-free gum after meals. What foods should I eat? Eat a healthy diet. Try to include: Foods with high amounts of fiber. These include: Fruits and vegetables. Whole grains and beans. Low-fat dairy products. Lean meats, fish, and poultry. Egg whites. Foods that cause symptoms in someone else may not cause symptoms for you. Work with your provider to find foods that are safe for you. The items listed above may not be all the foods and drinks you can have. Talk with a dietitian to learn more. The items listed above may not be a complete list of foods and beverages you can eat and drink. Contact a dietitian for more information. What foods should I avoid? Limiting some of these foods may help with your symptoms. Each person is different.  Talk with a dietitian or your provider to help you find the exact foods to avoid. Some of the foods to avoid may include: Fruits Fruits with a lot of acid in them. These may include citrus fruits, such as oranges, grapefruit, pineapple, and lemons. Vegetables Deep-fried vegetables, such as [place] fries. Vegetables, sauces, or toppings made with added fat and vegetables with acid in them. These may include tomatoes and tomato products, chili peppers, onions, garlic, and horseradish. Grains Pastries or quick breads with added fat. Meats and other proteins High-fat meats, such as fatty beef or pork, hot dogs, ribs, ham, sausage, salami, and bacon. Fried meat or protein, such as fried fish and fried chicken. Egg yolks. Fats and oils Butter. Margarine. Shortening. Ghee. Drinks Coffee and other drinks with caffeine in them. Fizzy and sugary drinks, such as soda and energy drinks. Fruit juice made with acidic fruits, such as orange or grapefruit. Tomato juice. Sweets and desserts Chocolate and cocoa. Donuts. Seasonings and condiments Mint, such as peppermint and spearmint. Condiments, herbs, or seasonings that cause symptoms. These may include curry, hot sauce, or vinegar-based salad dressings. The items listed above may not be all the foods and drinks you should avoid. Talk with a dietitian to learn more. Questions to ask your health care provider Changes to your diet and everyday life are often the first steps taken to manage symptoms of GERD. If these changes don't help, talk with your provider about taking medicines. Where to find more information [employer]intestinal [employer]:  aboutgerd.org This information is not intended to replace advice given to you by your health care provider. Make sure you discuss any questions you have with your health care provider. Document Revised: 08/30/2023 Document Reviewed: 03/16/2023 Elsevier Patient Education  2024 [employer].

## 2023-12-13 NOTE — Progress Notes (Signed)
Established Patient Office Visit  Subjective   Patient ID: Michael Pearson, male    DOB: 09/24/1961  Age: 63 y.o. MRN: [id number]  Chief Complaint  Patient presents with   Diabetes   Hypertension   Hyperlipidemia   Follow-up    HPI Michael Pearson is a 63 year old male who presents with chest pain and acid reflux symptoms. He is accompanied by his brother.  He recently experienced severe chest pain, prompting a visit to the emergency room due to concerns of a heart attack. The pain is described as sharp, with radiation downward, and was accompanied by swelling on one side of his face. He was treated with medication for his stomach, which provided some relief, but he continued to experience sharp pains that night. He is currently taking Nexium twice a day but still experiences pain. He notes that eating a hot dog did not cause discomfort, but consuming a slice of pizza resulted in severe chest pain the following morning. He has since avoided foods like hot dogs and pizza, particularly those with tomato sauce, which he believes exacerbate his symptoms.  Regarding his diabetes management, he mentions fluctuating blood sugar levels, with recent readings of 140 mg/dL and 409 mg/dL. He has not taken Rybelsus since last Friday due to a delay in receiving the medication from the pharmacy.  He reports chronic foot pain, describing it as feeling like walking on staples. A nurse previously assessed his foot sensation, finding that he could not feel a pinprick in 98% of his foot, although the remaining part aches constantly. He is under the care of a foot doctor, who has removed a piece from his foot, which has not recurred.  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 He also shares a recent personal loss, with the death of his oldest dog, which has affected his other dog, who is grieving. Discussed the use of AI scribe software for clinical note transcription with the  patient, who gave verbal consent to proceed.  History of Present Illness   Michael Pearson is a 63 year old male who presents with chest pain and acid reflux symptoms. He is accompanied by his brother.  He recently experienced severe chest pain, prompting a visit to the emergency room due to concerns of a heart attack. The pain is described as sharp, with radiation downward, and was accompanied by swelling on one side of his face. He was treated with medication for his stomach, which provided some relief, but he continued to experience sharp pains that night. He is currently taking Nexium twice a day but still experiences pain. He notes that eating a hot dog did not cause discomfort, but consuming a slice of pizza resulted in severe chest pain the following morning. He has since avoided foods like hot dogs and pizza, particularly those with tomato sauce, which he believes exacerbate his symptoms.  Regarding his diabetes management, he mentions fluctuating blood sugar levels, with recent readings of 140 mg/dL and 811 mg/dL. He has not taken Rybelsus since last Friday due to a delay in receiving the medication from the pharmacy.  He reports chronic foot pain, describing it as feeling like walking on staples. A nurse previously assessed his foot sensation, finding that he could not feel a pinprick in 98% of his foot, although the remaining part aches constantly. He is under the care of a foot doctor, who has removed a piece from his foot,  which has not recurred.  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 He also shares a recent personal loss, with the death of his oldest dog, which has affected his other dog, who is grieving.          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) 12/30/2022   Blood clotting disorder (HCC) 12/30/2022   Primary osteoarthritis of both knees 06/25/2022   Suprapubic abdominal  pain 12/25/2021   Dysphagia    Gastritis without bleeding    Hyperglycemia 07/29/2021   OSA (obstructive sleep apnea) 05/20/2021   Chronic pain of both knees 12/09/2020   Dizziness 07/31/2020   Cognitive communication deficit    Hypertension    CAD (coronary artery disease)    Tibial fracture 08/04/2018   Closed bicondylar fracture of left tibial plateau 08/04/2018   Fracture 08/03/2018   Preventative health care 11/24/2017   Hyperprolactinemia (HCC) 07/28/2017   Pituitary adenoma (HCC) 07/28/2017   Chest pain 05/19/2016   Laceration of right index finger w/o foreign body w/o damage to nail 01/14/2016   Visit for suture removal 01/09/2016   GERD (gastroesophageal reflux disease) 12/04/2015   Headache 08/14/2015   History of diverticulitis of colon 03/30/2013   Acute diverticulitis 02/03/2013   Essential hypertension 02/03/2013   Chronic allergic rhinitis 11/03/2012   Heme positive stool 11/03/2012   Foot injury 05/15/2012   Upper airway cough syndrome p batter acid exp vs asthma  07/22/2011   Migraines 06/17/2011   Mild intermittent asthma 06/17/2011   Non-ST elevation myocardial infarction (NSTEMI) 06/07/2011   Atherosclerosis of native coronary artery of native heart with angina pectoris (HCC) 06/07/2011   Hyperlipidemia 06/07/2011   Past Medical History:  Diagnosis Date   Allergy    Arthritis    Asthma    CAD (coronary artery disease)    NSTEMI 7/12:  Cardiac cath on 7/17: pLAD occluded, Dx 30-40%, pRCA 30-40%, mRCA 30-40%.  Proximal LAD was treated with a BMS.  Echo 7/19:  EF 60-65%, mild LVH.    ETT-Myoview 6/14:  Inf thinning, no ischemia, EF 64%, normal study // Myoview 04/2020: EF 57, normal perfusion; Low Risk     Cognitive communication deficit    Colitis    Diverticulosis    Duodenitis    May 2012 with heme positive stools at this time. Endorses only rare streaking of blood now.   External hemorrhoids    GERD (gastroesophageal reflux disease)    Heart murmur     as a child per the pt   Hiatal hernia    Hx of hemorrhoids    Hyperlipidemia    Hypertension    Kidney stones    Microadenoma    MRI in 2008 demonstrating 4.6 mm area of pituitary gland    Migraines    Has been evaluated multiple times in past for chronic headache in which pt has had occasional nose bleeds and bloodshot eyes. This lasted for 4-5 months. CT of the head was negative in May 2012.   Myocardial infarction (HCC) 05/17/2011   Pituitary tumor    RBBB (right bundle branch block)    Noted on EKG in 2008   Ulcer    Past Surgical History:  Procedure Laterality Date   BIOPSY  10/15/2021   Procedure: BIOPSY;  Surgeon: Beverley Fiedler, MD;  Location: [facility];  Service: Gastroenterology;;   COLONOSCOPY     egd  03/05/2011   Dr. Elnoria Howard   ESOPHAGOGASTRODUODENOSCOPY (EGD) WITH PROPOFOL N/A 10/15/2021   Procedure: ESOPHAGOGASTRODUODENOSCOPY (EGD) WITH PROPOFOL;  Surgeon: Beverley Fiedler, MD;  Location: [facility];  Service: Gastroenterology;  Laterality: N/A;  possible dilation   FLEXIBLE SIGMOIDOSCOPY  03/05/2011   Dr. Elnoria Howard   HAMMER TOE SURGERY     Heart stent     HERNIA REPAIR     In 20's   MALONEY DILATION  10/15/2021   Procedure: MALONEY DILATION;  Surgeon: Beverley Fiedler, MD;  Location: [facility];  Service: Gastroenterology;;  52cm    ORIF TIBIA PLATEAU Left 08/04/2018   Procedure: OPEN REDUCTION INTERNAL FIXATION (ORIF) TIBIAL PLATEAU;  Surgeon: Myrene Galas, MD;  Location: [facility];  Service: Orthopedics;  Laterality: Left;   SIGMOIDOSCOPY     TOOTH EXTRACTION     UPPER GASTROINTESTINAL ENDOSCOPY     Social History   Tobacco Use   Smoking status: Never   Smokeless tobacco: Never  Vaping Use   Vaping status: Never Used  Substance Use Topics   Alcohol use: No   Drug use: No   Social History   Socioeconomic History   Marital status: Single    Spouse name: Not on file   Number of children: Not on file   Years of education: Not on file   Highest education level:  Not on file  Occupational History   Occupation: TEFL teacher-- cleans 3 buildings    Comment: Fairly physical job    Employer: [employer]  Tobacco Use   Smoking status: Never   Smokeless tobacco: Never  Vaping Use   Vaping status: Never Used  Substance and Sexual Activity   Alcohol use: No   Drug use: No   Sexual activity: Not Currently  Other Topics Concern   Not on file  Social History Narrative   Lives with brother and mother   Mother is quite sick, he and his brother take turns taking care of her   No children   Social Drivers of Corporate investment banker Strain: Low Risk  (01/25/2023)   Overall Financial Resource Strain (CARDIA)    Difficulty of Paying Living Expenses: Not very hard  Food Insecurity: No Food Insecurity (01/25/2023)   Hunger Vital Sign    Worried About Running Out of Food in the Last Year: Never true    Ran Out of Food in the Last Year: Never true  Transportation Needs: No Transportation Needs (01/25/2023)   PRAPARE - Administrator, Civil Service (Medical): No    Lack of Transportation (Non-Medical): No  Physical Activity: Sufficiently Active (06/29/2023)   Exercise Vital Sign    Days of Exercise per Week: 6 days    Minutes of Exercise per Session: 30 min  Stress: No Stress Concern Present (07/09/2021)   [employer] of Occupational Health - Occupational Stress Questionnaire    Feeling of Stress : Not at all  Social Connections: Socially Isolated (01/25/2023)   Social Connection and Isolation Panel [NHANES]    Frequency of Communication with Friends and Family: More than three times a week    Frequency of Social Gatherings with Friends and Family: More than three times a week    Attends Religious Services: Never    Database administrator or Organizations: No    Attends Banker Meetings: Never    Marital Status: Never married  Intimate Partner Violence: Not At Risk (07/09/2021)   Humiliation, Afraid, Rape, and Kick  questionnaire  Fear of Current or Ex-Partner: No    Emotionally Abused: No    Physically Abused: No    Sexually Abused: No   Family Status  Relation Name Status   Mother  Deceased       50 yoa   Father  Deceased at age 57   Brother  Alive   Sister  Deceased at age birth   Mat Aunt  Alive   PGM  (Not Specified)   Neg Hx  (Not Specified)  No partnership data on file   Family History  Problem Relation Age of Onset   Stroke Mother    Heart attack Father 25       MI   Skin cancer Father    Cancer Father        ? type "rare"   Obesity Brother    Coronary artery disease Brother        Possible, pt not sure of specifics   Hypertension Brother    Colon polyps Maternal Aunt    Colon cancer Maternal Aunt    Colon polyps Paternal Grandmother    Other Neg Hx    Esophageal cancer Neg Hx    Rectal cancer Neg Hx    Stomach cancer Neg Hx    No Known Allergies    Review of Systems  Constitutional:  Negative for fever and malaise/fatigue.  HENT:  Negative for congestion.   Eyes:  Negative for blurred vision.  Respiratory:  Negative for cough and shortness of breath.   Cardiovascular:  Negative for chest pain, palpitations and leg swelling.  Gastrointestinal:  Negative for abdominal pain, blood in stool, nausea and vomiting.  Genitourinary:  Negative for dysuria and frequency.  Musculoskeletal:  Negative for back pain and falls.  Skin:  Negative for rash.  Neurological:  Negative for dizziness, loss of consciousness and headaches.  Endo/Heme/Allergies:  Negative for environmental allergies.  Psychiatric/Behavioral:  Negative for depression. The patient is not nervous/anxious.       Objective:     BP 114/82 (BP Location: Left Arm, Patient Position: Sitting)   Pulse 76   Temp 98.2 F (36.8 C) (Oral)   Resp 18   Ht 6\' 2"  (1.88 m)   Wt 257 lb 12.8 oz (116.9 kg)   SpO2 97%   BMI 33.10 kg/m  BP Readings from Last 3 Encounters:  12/13/23 114/82  12/11/23 (!) 146/79   09/07/23 128/75   Wt Readings from Last 3 Encounters:  12/13/23 257 lb 12.8 oz (116.9 kg)  12/11/23 248 lb (112.5 kg)  08/31/23 268 lb 9.6 oz (121.8 kg)   SpO2 Readings from Last 3 Encounters:  12/13/23 97%  12/11/23 95%  08/31/23 95%      Physical Exam Vitals and nursing note reviewed.  Constitutional:      General: He is not in acute distress.    Appearance: Normal appearance. He is well-developed.  HENT:     Head: Normocephalic and atraumatic.     Right Ear: Tympanic membrane, ear canal and external ear normal. There is no impacted cerumen.     Left Ear: Tympanic membrane, ear canal and external ear normal. There is no impacted cerumen.     Nose: Nose normal.     Mouth/Throat:     Mouth: Mucous membranes are moist.     Pharynx: Oropharynx is clear. No oropharyngeal exudate or posterior oropharyngeal erythema.  Eyes:     General: No scleral icterus.       Right eye: No  discharge.        Left eye: No discharge.     Conjunctiva/sclera: Conjunctivae normal.     Pupils: Pupils are equal, round, and reactive to light.  Neck:     Thyroid: No thyromegaly.     Vascular: No JVD.  Cardiovascular:     Rate and Rhythm: Normal rate and regular rhythm.     Heart sounds: Normal heart sounds. No murmur heard. Pulmonary:     Effort: Pulmonary effort is normal. No respiratory distress.     Breath sounds: Normal breath sounds.  Abdominal:     General: Bowel sounds are normal. There is no distension.     Palpations: Abdomen is soft. There is no mass.     Tenderness: There is no abdominal tenderness. There is no guarding or rebound.  Musculoskeletal:        General: Normal range of motion.     Cervical back: Normal range of motion and neck supple.     Right lower leg: No edema.     Left lower leg: No edema.  Lymphadenopathy:     Cervical: No cervical adenopathy.  Skin:    General: Skin is warm and dry.     Findings: No erythema or rash.  Neurological:     Mental Status: He is  alert and oriented to person, place, and time.     Cranial Nerves: No cranial nerve deficit.     Motor: No abnormal muscle tone.     Deep Tendon Reflexes: Reflexes are normal and symmetric. Reflexes normal.  Psychiatric:        Mood and Affect: Mood normal.        Behavior: Behavior normal.        Thought Content: Thought content normal.        Judgment: Judgment normal.      Results for orders placed or performed in visit on 12/13/23  Lipid panel  Result Value Ref Range   Cholesterol 96 0 - 200 mg/dL   Triglycerides 81.1 0.0 - 149.0 mg/dL   HDL 91.47 >82.95 mg/dL   VLDL 62.1 0.0 - 30.8 mg/dL   LDL Cholesterol 29 0 - 99 mg/dL   Total CHOL/HDL Ratio 2    NonHDL 45.68   CBC with Differential/Platelet  Result Value Ref Range   WBC 6.2 4.0 - 10.5 K/uL   RBC 4.73 4.22 - 5.81 Mil/uL   Hemoglobin 15.2 13.0 - 17.0 g/dL   HCT 65.7 84.6 - 96.2 %   MCV 97.4 78.0 - 100.0 fl   MCHC 33.0 30.0 - 36.0 g/dL   RDW 95.2 84.1 - 32.4 %   Platelets 190.0 150.0 - 400.0 K/uL   Neutrophils Relative % 55.7 43.0 - 77.0 %   Lymphocytes Relative 32.7 12.0 - 46.0 %   Monocytes Relative 8.7 3.0 - 12.0 %   Eosinophils Relative 2.2 0.0 - 5.0 %   Basophils Relative 0.7 0.0 - 3.0 %   Neutro Abs 3.5 1.4 - 7.7 K/uL   Lymphs Abs 2.0 0.7 - 4.0 K/uL   Monocytes Absolute 0.5 0.1 - 1.0 K/uL   Eosinophils Absolute 0.1 0.0 - 0.7 K/uL   Basophils Absolute 0.0 0.0 - 0.1 K/uL  Comprehensive metabolic panel  Result Value Ref Range   Sodium 140 135 - 145 mEq/L   Potassium 4.5 3.5 - 5.1 mEq/L   Chloride 100 96 - 112 mEq/L   CO2 32 19 - 32 mEq/L   Glucose, Bld 105 (H) 70 - 99  mg/dL   BUN 12 6 - 23 mg/dL   Creatinine, Ser 1.61 0.40 - 1.50 mg/dL   Total Bilirubin 0.6 0.2 - 1.2 mg/dL   Alkaline Phosphatase 49 39 - 117 U/L   AST 24 0 - 37 U/L   ALT 33 0 - 53 U/L   Total Protein 7.2 6.0 - 8.3 g/dL   Albumin 4.6 3.5 - 5.2 g/dL   GFR 09.60 >45.40 mL/min   Calcium 9.7 8.4 - 10.5 mg/dL  Hemoglobin J8J  Result Value  Ref Range   Hgb A1c MFr Bld 6.9 (H) 4.6 - 6.5 %    Last CBC Lab Results  Component Value Date   WBC 6.2 12/13/2023   HGB 15.2 12/13/2023   HCT 46.0 12/13/2023   MCV 97.4 12/13/2023   MCH 31.1 12/11/2023   RDW 14.1 12/13/2023   PLT 190.0 12/13/2023   Last metabolic panel Lab Results  Component Value Date   GLUCOSE 105 (H) 12/13/2023   NA 140 12/13/2023   K 4.5 12/13/2023   CL 100 12/13/2023   CO2 32 12/13/2023   BUN 12 12/13/2023   CREATININE 0.76 12/13/2023   GFRNONAA >60 12/11/2023   CALCIUM 9.7 12/13/2023   PHOS 3.9 08/05/2018   PROT 7.2 12/13/2023   ALBUMIN 4.6 12/13/2023   BILITOT 0.6 12/13/2023   ALKPHOS 49 12/13/2023   AST 24 12/13/2023   ALT 33 12/13/2023   ANIONGAP 14 12/11/2023   Last lipids Lab Results  Component Value Date   CHOL 96 12/13/2023   HDL 50.30 12/13/2023   LDLCALC 29 12/13/2023   TRIG 82.0 12/13/2023   CHOLHDL 2 12/13/2023   Last hemoglobin A1c Lab Results  Component Value Date   HGBA1C 6.9 (H) 12/13/2023   Last thyroid functions Lab Results  Component Value Date   TSH 1.75 03/31/2023   Last vitamin D Lab Results  Component Value Date   VD25OH 24.9 (L) 08/05/2018   Last vitamin B12 and Folate Lab Results  Component Value Date   VITAMINB12 334 12/30/2022      The ASCVD Risk score (Arnett DK, et al., 2019) failed to calculate for the following reasons:   Risk score cannot be calculated because patient has a medical history suggesting prior/existing ASCVD    Assessment & Plan:   Problem List Items Addressed This Visit       Unprioritized   Mild intermittent asthma   Hyperprolactinemia (HCC)   Hyperlipidemia - Primary   Relevant Orders   Lipid panel (Completed)   Comprehensive metabolic panel (Completed)   Essential hypertension   Well controlled, no changes to meds. Encouraged heart healthy diet such as the DASH diet and exercise as tolerated.        Relevant Orders   Lipid panel (Completed)   CBC with  Differential/Platelet (Completed)   Comprehensive metabolic panel (Completed)   Cognitive communication deficit   stable      Atherosclerosis of native coronary artery of native heart with angina pectoris (HCC)   Con't statin  Check labs       Other Visit Diagnoses       Type 2 diabetes mellitus with hyperglycemia, without long-term current use of insulin (HCC)       Relevant Medications   metFORMIN (GLUCOPHAGE-XR) 500 MG 24 hr tablet   Other Relevant Orders   Comprehensive metabolic panel (Completed)   Hemoglobin A1c (Completed)   Microalbumin / creatinine urine ratio     Need for pneumococcal 20-valent conjugate vaccination  Relevant Orders   Pneumococcal conjugate vaccine 20-valent (Prevnar 20) (Completed)     Abnormal urine odor       Relevant Orders   POCT Urinalysis Dipstick (Automated)     Assessment and Plan    Gastroesophageal Reflux Disease (GERD) Chest pain initially raised concerns for myocardial infarction but was later attributed to GERD. Symptoms include sharp chest pain worsened by foods like pizza and hot dogs. Nexium is taken twice daily with partial relief. Dietary modifications and the risks of continued GERD symptoms were discussed. Continue Nexium twice daily, avoid trigger foods, and use Pepcid as needed for breakthrough symptoms.  Diabetes Mellitus Blood glucose levels are fluctuating, with recent readings of 140 mg/dL and 952 mg/dL. The last dose of Rybelsus was on Friday, and the pharmacy is expected to have the medication today. The importance of medication adherence and regular blood glucose monitoring was discussed. Resume Rybelsus as soon as available and continue regular blood glucose monitoring.  Peripheral Neuropathy Chronic foot pain is described as walking on staples, with a recent evaluation showing 98% insensate to pinprick. Under podiatrist care, the importance of regular follow-up to manage symptoms and prevent complications was  discussed. Continue follow-up with the podiatrist.  General Health Maintenance Due for a pneumonia vaccine, with the flu shot already received at [facility]. The importance of staying up-to-date with vaccinations was discussed. Administer the pneumonia vaccine today.  Follow-up A follow-up with the hearing doctor is scheduled for tomorrow, and a return visit is planned in six months.        Return in about 6 months (around 06/11/2024), or if symptoms worsen or fail to improve, for annual exam, fasting.    Donato Schultz, DO

## 2023-12-14 ENCOUNTER — Encounter: Payer: Self-pay | Admitting: Family Medicine

## 2023-12-14 LAB — HEMOGLOBIN A1C: Hgb A1c MFr Bld: 6.9 % — ABNORMAL HIGH (ref 4.6–6.5)

## 2023-12-15 LAB — MICROALBUMIN / CREATININE URINE RATIO
Creatinine,U: 40 mg/dL
Microalb Creat Ratio: 17.5 mg/g (ref 0.0–30.0)
Microalb, Ur: <0.7 mg/dL (ref 0.0–1.9)

## 2024-01-04 ENCOUNTER — Ambulatory Visit: Payer: No Typology Code available for payment source | Admitting: "Endocrinology

## 2024-01-16 ENCOUNTER — Other Ambulatory Visit: Payer: Self-pay | Admitting: Family Medicine

## 2024-01-16 DIAGNOSIS — I10 Essential (primary) hypertension: Secondary | ICD-10-CM

## 2024-01-18 ENCOUNTER — Encounter: Payer: Self-pay | Admitting: Pulmonary Disease

## 2024-01-18 ENCOUNTER — Ambulatory Visit: Payer: No Typology Code available for payment source | Admitting: Pulmonary Disease

## 2024-01-18 ENCOUNTER — Ambulatory Visit (INDEPENDENT_AMBULATORY_CARE_PROVIDER_SITE_OTHER): Payer: Medicare HMO | Admitting: Pharmacist

## 2024-01-18 VITALS — BP 118/64 | HR 77 | Temp 98.0°F | Ht 74.0 in | Wt 255.4 lb

## 2024-01-18 DIAGNOSIS — I2510 Atherosclerotic heart disease of native coronary artery without angina pectoris: Secondary | ICD-10-CM

## 2024-01-18 DIAGNOSIS — G4733 Obstructive sleep apnea (adult) (pediatric): Secondary | ICD-10-CM | POA: Diagnosis not present

## 2024-01-18 DIAGNOSIS — E66811 Obesity, class 1: Secondary | ICD-10-CM | POA: Diagnosis not present

## 2024-01-18 DIAGNOSIS — K219 Gastro-esophageal reflux disease without esophagitis: Secondary | ICD-10-CM

## 2024-01-18 DIAGNOSIS — E785 Hyperlipidemia, unspecified: Secondary | ICD-10-CM

## 2024-01-18 MED ORDER — ATORVASTATIN CALCIUM 40 MG PO TABS
40.0000 mg | ORAL_TABLET | Freq: Every day | ORAL | 3 refills | Status: AC
  Filled 2024-04-25: qty 90, 90d supply, fill #0
  Filled 2024-07-17: qty 90, 90d supply, fill #1
  Filled 2024-10-19: qty 90, 90d supply, fill #2

## 2024-01-18 MED ORDER — ESOMEPRAZOLE MAGNESIUM 40 MG PO CPDR: 40.0000 mg | DELAYED_RELEASE_CAPSULE | Freq: Two times a day (BID) | ORAL | 1 refills | Status: DC

## 2024-01-18 MED ORDER — EZETIMIBE 10 MG PO TABS
10.0000 mg | ORAL_TABLET | Freq: Every day | ORAL | 3 refills | Status: AC
  Filled 2024-04-25: qty 90, 90d supply, fill #0
  Filled 2024-08-16: qty 90, 90d supply, fill #1
  Filled 2024-11-29: qty 90, 90d supply, fill #2

## 2024-01-18 NOTE — Progress Notes (Signed)
 Pharmacy Note  01/18/2024 Name:  Michael Pearson MRN:  [id number] DOB:  07-Jan-1961  Chief Complaint  Patient presents with   Medication Management   Diabetes   Hypertension     Subjective: Michael Pearson is an 63 y.o. year old male who is a primary patient of Donato Schultz, DO.  The patient was referred to the Clinical Pharmacist Practitioner for assistance with diabetes and medication management.   Today patient mentioned that he was told that the cost of all his medications would increase and he is worried about the potential cost. In 2024 he was duel eligible for Medicare and Medicaid.   Engaged with patient by telephone for follow up.   Diabetes: Current medications: Rybelsus 14mg  each morning, metformin ER 500mg  twice a day  Patient endorses today he has been taking Rybelsus according to recommendations - on an empty stomach with a sip of water each morning.  Dose of metformin was increase at visit in September 2024  A1c prior to starting Rybelsus was 8.5%  Weight prior to starting Rybelsus was 293 lbs; Current weight = 248 lbs Total weight loss 45 lbs  Wt Readings from Last 3 Encounters:  12/13/23 257 lb 12.8 oz (116.9 kg)  12/11/23 248 lb (112.5 kg)  08/31/23 268 lb 9.6 oz (121.8 kg)   Current glucose readings checking 1 or 2 times per day.   Recent home blood glucose per patient 85 to 115   Diet:  drinking water more. He reports Rybelsus is helping with appetite and he has been able to eat smaller portions.   Exercise: a little walking daily but cannot walk to far, about 15 to 20 minutes. Stationary bike about 10 to 15 minutes.  Patient reports occasional hypoglycemic s/sx including weakness, dizziness, shakiness, sweating.  Usually treats with peppermint candies.  Patient reports hyperglycemic symptoms including neuropathy and polyuria, nocturia;  Denies polyphagia, polydipsia, blurred vision  Eye Exam - was completed 07/06/2023 - no retinopathy noted.   Influenza vaccine was given 09/20/2023 per [facility]   Hyperprolactinemia / pituitary adenoma: Last visit with Dr Roosevelt Locks was 08/31/2023  - patient missed follow-up 01/04/2024 - he didn't know about appointment. He plans reschedule.  Taking cabergoline 0.25mg  twice per week  Hypertension:  Current therapy - losartan 50mg  daily - LR was 01/16/2024 for 90 days  He has automatic blood pressure cuff at home. Yesterday 115/67 and HR of 68  BP Readings from Last 3 Encounters:  12/13/23 114/82  12/11/23 (!) 146/79  09/07/23 128/75   Hyperlipidemia:  Current therapy: atorvastatin and ezetimibe daily - both last refilled 10/25/2023 for 90 DS - [name] reports he needs updated prescriptions for atorvastatin and ezetimibe sent to his pharmacy.   Patient is tolerating statin well.   Mild Intermittent Asthma:  Maintenance Inhaler: generic Wixela 250/50 mcg 1 inhalation twice a day - he reports that the cost for 2025 will increase and he is worried about cost.  Rescue inhaler use - albuterol - hasn't needed recently  Objective:  Lab Results  Component Value Date   HGBA1C 6.9 (H) 12/13/2023    Lab Results  Component Value Date   CREATININE 0.76 12/13/2023   BUN 12 12/13/2023   NA 140 12/13/2023   K 4.5 12/13/2023   CL 100 12/13/2023   CO2 32 12/13/2023    Lab Results  Component Value Date   CHOL 96 12/13/2023   HDL 50.30 12/13/2023   LDLCALC 29 12/13/2023   TRIG 82.0  12/13/2023   CHOLHDL 2 12/13/2023    Medications Reviewed Today     Reviewed by Henrene Pastor, RPH-CPP (Pharmacist) on 01/18/24 at 1214  Med List Status: <None>   Medication Order Taking? Sig Documenting Provider Last Dose Status Informant  albuterol (VENTOLIN HFA) 108 (90 Base) MCG/ACT inhaler [id number] Yes Inhale 2 puffs into the lungs every 4 (four) hours as needed for wheezing or shortness of breath. (Rescue inhaler) Donato Schultz, DO Taking Active   aspirin EC 81 MG tablet [id number] Yes Take 81  mg by mouth in the morning. Swallow whole. [provider] Taking Active Self  atorvastatin (LIPITOR) 40 MG tablet [id number] Yes Take 1 tablet (40 mg total) by mouth daily. Seabron Spates R, DO Taking Active   Blood Glucose Monitoring Suppl (ONETOUCH VERIO FLEX SYSTEM) w/Device KIT [id number] Yes Use to check blood glucose once a day (Dx: type 2 DM E11.65) Zola Button, Grayling Congress, DO Taking Active   cabergoline (DOSTINEX) 0.5 MG tablet [id number] Yes Take 0.5 tablets (0.25 mg total) by mouth 2 (two) times a week. Altamese La Tour, MD Taking Active   cetirizine (ZYRTEC) 10 MG tablet [id number] Yes Take 10 mg by mouth daily as needed for allergies.  [provider] Taking Active Self  diclofenac Sodium (VOLTAREN) 1 % GEL [id number] Yes Apply 4 g topically 4 (four) times daily. Donato Schultz, DO Taking Active   docusate sodium (COLACE) 100 MG capsule [id number] Yes Take 100 mg by mouth 2 (two) times daily as needed (constipation.). [provider] Taking Active Self  esomeprazole (NEXIUM) 40 MG capsule [id number] Yes Take 1 capsule (40 mg total) by mouth 2 (two) times daily before a meal. Zola Button, Grayling Congress, DO Taking Active   ezetimibe (ZETIA) 10 MG tablet [id number] Yes Take 1 tablet (10 mg total) by mouth daily. Zola Button, Myrene Buddy R, DO Taking Active   fluticasone-salmeterol [facility] INHUB) 250-50 MCG/ACT AEPB [id number] No Inhale 1 puff into the lungs in the morning and at bedtime. Replaces Trelegy inhaler as Maintenance inhaler  Patient not taking: Reported on 01/18/2024   Donato Schultz, DO Not Taking Active   glucose blood [facility] VERIO) test strip [id number] Yes Use to check blood glucose once a day (Dx: type 2 DM E11.65) Donato Schultz, DO Taking Active   Lancets [facility] [place] PLUS [place]) MISC [id number]  Use to check blood glucose once a day (Dx: type 2 DM E11.65) Zola Button, Grayling Congress, DO  Active   losartan (COZAAR) 50 MG tablet [id number] Yes  TAKE 1 TABLET(50 MG) BY MOUTH DAILY Zola Button, Grayling Congress, DO Taking Active   meclizine (ANTIVERT) 25 MG tablet [id number] No Take 1 tablet (25 mg total) by mouth 3 (three) times daily as needed for dizziness.  Patient not taking: Reported on 06/29/2023   Wanda Plump, MD Not Taking Active   metFORMIN (GLUCOPHAGE-XR) 500 MG 24 hr tablet [id number] Yes Take 2 tablets (1,000 mg total) by mouth 2 (two) times daily with a meal. Zola Button, Grayling Congress, DO Taking Active   Multiple Vitamin (MULTIVITAMIN WITH MINERALS) TABS tablet [id number] Yes Take 1 tablet by mouth in the morning. [provider] Taking Active Self  RYBELSUS 14 MG TABS [id number] Yes TAKE 1 TABLET BY MOUTH EVERY MORNING 30 MINUTES BEFORE FOOD, DRINKS, OR OTHER MEDS. TAKE WITH NO MORE THAN 4 OZ WATER Zola Button, [place] R, [place] Taking Active   silver sulfADIAZINE (SILVADENE) 1 % cream [id number]  Apply 1 Application topically daily. Candelaria Stagers, DPM  Active               Medication Assistance: Patient has qualified for Medicaid for 2025.   Assessment/Plan:   Diabetes: At A1c goal of < 7.0%/ He has also achieved 15% weight loss over the last year.  - Reviewed goal A1c, goal fasting, and goal 2 hour post prandial glucose - Continue metformin ER 500mg  take 2 tablets = 1000mg  twice a day - Continue Rybelsus to 14mg  once a day each morning.  - Continue to check blood glucose once daily - varying time of day.  . - Reviewed home blood glucose goals  Fasting blood glucose goal (before meals) = 80 to 130 Blood glucose goal after a meal = less than 180   Hypertension - home blood pressure was at goal today Continue losartan  Continue to check blood pressure at home 2 or 3 times per week Reviewed blood pressure goal - < 130/80  Hyperlipidemia:  Continue atorvastatin and ezetimibe   Asthma:  Patient to discuss inhaler therapy with pulmonologist at appointment later today.  Continue to use albuterol as needed.   Medication  Management:  - Reviewed his 2025 insurance coverage. Patient still has Medicare and Medicaid. Brand medication cost will increase from $11.15 to $12.15 for 2025; Generic medications will be $0 to $5.40.   Meds ordered this encounter  Medications   atorvastatin (LIPITOR) 40 MG tablet    Sig: Take 1 tablet (40 mg total) by mouth daily.    Dispense:  90 tablet    Refill:  3   esomeprazole (NEXIUM) 40 MG capsule    Sig: Take 1 capsule (40 mg total) by mouth 2 (two) times daily before a meal.    Dispense:  180 capsule    Refill:  1   ezetimibe (ZETIA) 10 MG tablet    Sig: Take 1 tablet (10 mg total) by mouth daily.    Dispense:  90 tablet    Refill:  3     Follow up: 3 to 4 months to check adherence / follow up DM  Henrene Pastor, PharmD Clinical Pharmacist [facility]

## 2024-01-18 NOTE — Progress Notes (Signed)
 Michael Pearson    [id number]    10-13-1961  Primary Care Physician:Lowne Chase, Grayling Congress, DO  Referring Physician: Zola Button, Grayling Congress, DO [address] [place],  [place] [address]  Chief complaint:   Patient with severe obstructive sleep apnea  HPI:  His machine did have a smell this morning and the humidification chamber felt very hot There was an odor to it, first time it happened  Cracks in the humidification chamber  Has been using CPAP since 2022 Tolerating it well Waking up feeling like he is at a good nights rest  He does have a dry mouth, dry eyes in the morning  Since the last visit as had multiple foot surgery  His health has been otherwise stable  Denies any chest pains or chest discomfort Activity tolerance limited by his neuropathic pain, problems with his feet  Wakes up feeling like he is at a decent nights rest on most nights   Outpatient Encounter Medications as of 01/18/2024  Medication Sig   albuterol (VENTOLIN HFA) 108 (90 Base) MCG/ACT inhaler Inhale 2 puffs into the lungs every 4 (four) hours as needed for wheezing or shortness of breath. (Rescue inhaler)   aspirin EC 81 MG tablet Take 81 mg by mouth in the morning. Swallow whole.   atorvastatin (LIPITOR) 40 MG tablet Take 1 tablet (40 mg total) by mouth daily.   Blood Glucose Monitoring Suppl (ONETOUCH VERIO FLEX SYSTEM) w/Device KIT Use to check blood glucose once a day (Dx: type 2 DM E11.65)   cabergoline (DOSTINEX) 0.5 MG tablet Take 0.5 tablets (0.25 mg total) by mouth 2 (two) times a week.   cetirizine (ZYRTEC) 10 MG tablet Take 10 mg by mouth daily as needed for allergies.    diclofenac Sodium (VOLTAREN) 1 % GEL Apply 4 g topically 4 (four) times daily.   docusate sodium (COLACE) 100 MG capsule Take 100 mg by mouth 2 (two) times daily as needed (constipation.).   esomeprazole (NEXIUM) 40 MG capsule Take 1 capsule (40 mg total) by mouth 2 (two) times daily before a meal.    ezetimibe (ZETIA) 10 MG tablet Take 1 tablet (10 mg total) by mouth daily.   fluticasone-salmeterol (WIXELA INHUB) 250-50 MCG/ACT AEPB Inhale 1 puff into the lungs in the morning and at bedtime. Replaces Trelegy inhaler as Maintenance inhaler   glucose blood (ONETOUCH VERIO) test strip Use to check blood glucose once a day (Dx: type 2 DM E11.65)   Lancets (ONETOUCH DELICA PLUS LANCET33G) MISC Use to check blood glucose once a day (Dx: type 2 DM E11.65)   losartan (COZAAR) 50 MG tablet TAKE 1 TABLET(50 MG) BY MOUTH DAILY   meclizine (ANTIVERT) 25 MG tablet Take 1 tablet (25 mg total) by mouth 3 (three) times daily as needed for dizziness.   metFORMIN (GLUCOPHAGE-XR) 500 MG 24 hr tablet Take 2 tablets (1,000 mg total) by mouth 2 (two) times daily with a meal.   Multiple Vitamin (MULTIVITAMIN WITH MINERALS) TABS tablet Take 1 tablet by mouth in the morning.   RYBELSUS 14 MG TABS TAKE 1 TABLET BY MOUTH EVERY MORNING 30 MINUTES BEFORE FOOD, DRINKS, OR OTHER MEDS. TAKE WITH NO MORE THAN 4 OZ WATER   silver sulfADIAZINE (SILVADENE) 1 % cream Apply 1 Application topically daily.   No facility-administered encounter medications on file as of 01/18/2024.    Allergies as of 01/18/2024   (No Known Allergies)  Past Medical History:  Diagnosis Date   Allergy    Arthritis    Asthma    CAD (coronary artery disease)    NSTEMI 7/12:  Cardiac cath on 7/17: pLAD occluded, Dx 30-40%, pRCA 30-40%, mRCA 30-40%.  Proximal LAD was treated with a BMS.  Echo 7/19:  EF 60-65%, mild LVH.    ETT-Myoview 6/14:  Inf thinning, no ischemia, EF 64%, normal study // Myoview 04/2020: EF 57, normal perfusion; Low Risk     Cognitive communication deficit    Colitis    Diverticulosis    Duodenitis    May 2012 with heme positive stools at this time. Endorses only rare streaking of blood now.   External hemorrhoids    GERD (gastroesophageal reflux disease)    Heart murmur    as a child per the pt   Hiatal hernia    Hx  of hemorrhoids    Hyperlipidemia    Hypertension    Kidney stones    Microadenoma    MRI in 2008 demonstrating 4.6 mm area of pituitary gland    Migraines    Has been evaluated multiple times in past for chronic headache in which pt has had occasional nose bleeds and bloodshot eyes. This lasted for 4-5 months. CT of the head was negative in May 2012.   Myocardial infarction (HCC) 05/17/2011   Pituitary tumor    RBBB (right bundle branch block)    Noted on EKG in 2008   Ulcer     Past Surgical History:  Procedure Laterality Date   BIOPSY  10/15/2021   Procedure: BIOPSY;  Surgeon: Beverley Fiedler, MD;  Location: [facility];  Service: Gastroenterology;;   COLONOSCOPY     egd  03/05/2011   Dr. Elnoria Howard   ESOPHAGOGASTRODUODENOSCOPY (EGD) WITH PROPOFOL N/A 10/15/2021   Procedure: ESOPHAGOGASTRODUODENOSCOPY (EGD) WITH PROPOFOL;  Surgeon: Beverley Fiedler, MD;  Location: [facility];  Service: Gastroenterology;  Laterality: N/A;  possible dilation   FLEXIBLE SIGMOIDOSCOPY  03/05/2011   Dr. Elnoria Howard   HAMMER TOE SURGERY     Heart stent     HERNIA REPAIR     In 20's   MALONEY DILATION  10/15/2021   Procedure: MALONEY DILATION;  Surgeon: Beverley Fiedler, MD;  Location: [facility];  Service: Gastroenterology;;  52cm    ORIF TIBIA PLATEAU Left 08/04/2018   Procedure: OPEN REDUCTION INTERNAL FIXATION (ORIF) TIBIAL PLATEAU;  Surgeon: Myrene Galas, MD;  Location: [facility] OR;  Service: Orthopedics;  Laterality: Left;   SIGMOIDOSCOPY     TOOTH EXTRACTION     UPPER GASTROINTESTINAL ENDOSCOPY      Family History  Problem Relation Age of Onset   Stroke Mother    Heart attack Father 7       MI   Skin cancer Father    Cancer Father        ? type "rare"   Obesity Brother    Coronary artery disease Brother        Possible, pt not sure of specifics   Hypertension Brother    Colon polyps Maternal Aunt    Colon cancer Maternal Aunt    Colon polyps Paternal Grandmother    Other Neg Hx    Esophageal cancer  Neg Hx    Rectal cancer Neg Hx    Stomach cancer Neg Hx     Social History   Socioeconomic History   Marital status: Single    Spouse name: Not on file   Number of  children: Not on file   Years of education: Not on file   Highest education level: Not on file  Occupational History   Occupation: TEFL teacher-- cleans 3 buildings    Comment: Fairly physical job    Employer: [employer]  Tobacco Use   Smoking status: Never   Smokeless tobacco: Never  Vaping Use   Vaping status: Never Used  Substance and Sexual Activity   Alcohol use: No   Drug use: No   Sexual activity: Not Currently  Other Topics Concern   Not on file  Social History Narrative   Lives with brother and mother   Mother is quite sick, he and his brother take turns taking care of her   No children   Social Drivers of Corporate investment banker Strain: Low Risk  (01/25/2023)   Overall Financial Resource Strain (CARDIA)    Difficulty of Paying Living Expenses: Not very hard  Food Insecurity: No Food Insecurity (01/25/2023)   Hunger Vital Sign    Worried About Running Out of Food in the Last Year: Never true    Ran Out of Food in the Last Year: Never true  Transportation Needs: No Transportation Needs (01/25/2023)   PRAPARE - Administrator, Civil Service (Medical): No    Lack of Transportation (Non-Medical): No  Physical Activity: Sufficiently Active (06/29/2023)   Exercise Vital Sign    Days of Exercise per Week: 6 days    Minutes of Exercise per Session: 30 min  Stress: No Stress Concern Present (07/09/2021)   [employer] of Occupational Health - Occupational Stress Questionnaire    Feeling of Stress : Not at all  Social Connections: Socially Isolated (01/25/2023)   Social Connection and Isolation Panel [NHANES]    Frequency of Communication with Friends and Family: More than three times a week    Frequency of Social Gatherings with Friends and Family: More than three times a week     Attends Religious Services: Never    Database administrator or Organizations: No    Attends Banker Meetings: Never    Marital Status: Never married  Intimate Partner Violence: Not At Risk (07/09/2021)   Humiliation, Afraid, Rape, and Kick questionnaire    Fear of Current or Ex-Partner: No    Emotionally Abused: No    Physically Abused: No    Sexually Abused: No    Review of Systems  Constitutional:  Negative for fatigue.  Respiratory:  Positive for apnea and shortness of breath.   Psychiatric/Behavioral:  Positive for sleep disturbance.     Vitals:   01/18/24 1315  BP: 118/64  Pulse: 77  Temp: 98 F (36.7 C)  SpO2: 97%     Physical Exam Constitutional:      Appearance: He is obese.  HENT:     Right Ear: Tympanic membrane normal.     Mouth/Throat:     Mouth: Mucous membranes are moist.     Comments: Micrognathia, crowded oropharynx, Mallampati 4 Eyes:     General: No scleral icterus. Cardiovascular:     Rate and Rhythm: Normal rate and regular rhythm.     Heart sounds: No murmur heard.    No friction rub.  Pulmonary:     Effort: No respiratory distress.     Breath sounds: No stridor. No wheezing or rhonchi.  Musculoskeletal:     Cervical back: No rigidity or tenderness.  Neurological:     Mental Status: He is alert.  Cranial Nerves: No cranial nerve deficit.  Psychiatric:        Mood and Affect: Mood normal.    Data Reviewed: Sleep study from 2022 reviewed showing severe obstructive sleep apnea  Compliance data reviewed showing excellent compliance of 100% CPAP of 16 Residual AHI of 9.6 He does have some mask leaks  Assessment:  Severe obstructive sleep apnea -Compliant with CPAP use -Machine becoming dysfunctional  -Cracks in the humidification chamber -Machine overheating  Class I obesity -Working on weight loss efforts   Plan/Recommendations:  DME referral for CPAP supplies  DME referral for new machine  Patient uses  a fullface mask  Encouraged to continue weight loss efforts  Follow-up in about 3 months  Encouraged to call with any significant concerns  The importance of having his device evaluated and problems rectified as soon as possible was stressed  Virl Diamond MD [facility] 01/18/2024, 1:18 PM  CC: Donato Schultz, *

## 2024-01-18 NOTE — Patient Instructions (Signed)
 DME referral for CPAP supplies  Machine is becoming dysfunctional  New CPAP machine CPAP settings of 16  Continue weight loss efforts Graded exercises as tolerated  Follow-up in 3 months

## 2024-03-05 ENCOUNTER — Telehealth: Payer: Self-pay

## 2024-03-05 ENCOUNTER — Telehealth: Payer: Self-pay | Admitting: Family Medicine

## 2024-03-05 ENCOUNTER — Other Ambulatory Visit (HOSPITAL_COMMUNITY): Payer: Self-pay

## 2024-03-05 DIAGNOSIS — K219 Gastro-esophageal reflux disease without esophagitis: Secondary | ICD-10-CM

## 2024-03-05 NOTE — Telephone Encounter (Signed)
 Noted.

## 2024-03-05 NOTE — Telephone Encounter (Signed)
 Pharmacy Patient Advocate Encounter   Received notification from Onbase that prior authorization for Esomeprazole  Magnesium  40MG  dr capsules is required/requested.   Insurance verification completed.   The patient is insured through [facility] .   Per test claim: PA required and submitted KEY/EOC/Request #: [id number] CANCELLED by the processor we sent it to: Resubmitted through Fax to [facility]

## 2024-03-05 NOTE — Telephone Encounter (Signed)
 Copied from CRM [id number]. Topic: General - Other >> Mar 05, 2024 12:11 PM [place] E wrote: Reason for CRM: Paola Bohr from [employer] called wanting to give the office a heads-up that she will be faxing over some clinical questions regarding the patient's medication. Relayed fax number to Paola Bohr and she did not have any further questions.

## 2024-03-06 DIAGNOSIS — G4733 Obstructive sleep apnea (adult) (pediatric): Secondary | ICD-10-CM | POA: Diagnosis not present

## 2024-03-07 ENCOUNTER — Other Ambulatory Visit: Payer: Self-pay | Admitting: Family Medicine

## 2024-03-07 MED ORDER — ESOMEPRAZOLE MAGNESIUM 40 MG PO CPDR: 40.0000 mg | DELAYED_RELEASE_CAPSULE | Freq: Every day | ORAL | 11 refills | Status: DC

## 2024-03-07 NOTE — Telephone Encounter (Signed)
 Rx sent.

## 2024-03-07 NOTE — Addendum Note (Signed)
 Addended by: Ysidro Her on: 03/07/2024 02:23 PM   Modules accepted: Orders

## 2024-03-07 NOTE — Telephone Encounter (Signed)
 Pharmacy Patient Advocate Encounter  Received notification from [facility] that Prior Authorization for Esomeprazole  Magnesium  40MG  dr capsules has been DENIED.  Full denial letter will be uploaded to the media tab. See denial reason below.   PA #/Case ID/Reference #: --

## 2024-03-09 ENCOUNTER — Telehealth: Payer: Self-pay

## 2024-03-09 NOTE — Telephone Encounter (Signed)
 Copied from CRM [id number]. Topic: Clinical - Medication Prior Auth >> Mar 08, 2024  2:19 PM Caliyah H wrote: Reason for CRM: Patient called regarding PA issues for esomeprazole  and glucose test strips. While at the pharmacy, He was informed that a PA request for esomeprazole  was submitted yesterday. Regarding the glucose test strips, He was told that due to insurance denial, the prescription was filled one time at [facility], and the system does not indicate alternative pharmacies for fulfillment. Patient seeks clarification on the next steps to obtain these medications.   Pharmacy callback number: [phone]

## 2024-03-12 ENCOUNTER — Other Ambulatory Visit: Payer: Self-pay | Admitting: Family Medicine

## 2024-03-12 ENCOUNTER — Telehealth: Payer: Self-pay | Admitting: Family Medicine

## 2024-03-12 ENCOUNTER — Other Ambulatory Visit (HOSPITAL_COMMUNITY): Payer: Self-pay

## 2024-03-12 DIAGNOSIS — K219 Gastro-esophageal reflux disease without esophagitis: Secondary | ICD-10-CM

## 2024-03-12 MED ORDER — ACCU-CHEK GUIDE TEST VI STRP
ORAL_STRIP | 12 refills | Status: DC
  Filled 2024-03-12: qty 100, 90d supply, fill #0
  Filled 2024-03-12: qty 100, 100d supply, fill #0

## 2024-03-12 MED ORDER — ESOMEPRAZOLE MAGNESIUM 40 MG PO CPDR
40.0000 mg | DELAYED_RELEASE_CAPSULE | Freq: Every day | ORAL | 11 refills | Status: AC
  Filled 2024-03-12: qty 30, 30d supply, fill #0

## 2024-03-12 NOTE — Telephone Encounter (Signed)
 Copied from CRM [id number]. Topic: Clinical - Prescription Issue >> Mar 09, 2024  4:23 PM Kevelyn M wrote: Reason for CRM: Patient is calling back again in regards to glucose test strips and Esomeprazole  Magnesium  40MG . Pattie Borders acknowledged that she received his message and will call him as soon as she's available.

## 2024-03-12 NOTE — Addendum Note (Signed)
 Addended by: Ysidro Her on: 03/12/2024 03:16 PM   Modules accepted: Orders

## 2024-03-12 NOTE — Telephone Encounter (Signed)
 Spoke with pt. Pt states needing to move meds to . Rx sent

## 2024-03-13 ENCOUNTER — Telehealth: Payer: Self-pay

## 2024-03-13 NOTE — Telephone Encounter (Signed)
 Copied from CRM [id number]. Topic: Clinical - Prescription Issue >> Mar 13, 2024  8:34 AM Juleen [place] F wrote: Reason for CRM: Patient brother Royston Cornea called regarding the test strips and esomeprazole  medication that was sent to the pharmacy yesterday - says insurance will not cover it and he needs an alternative for both that will be covered. Please call Royston Cornea with an update at [phone]. Patient is out of test strips.

## 2024-03-14 ENCOUNTER — Other Ambulatory Visit (HOSPITAL_COMMUNITY): Payer: Self-pay

## 2024-03-14 ENCOUNTER — Telehealth: Payer: Self-pay

## 2024-03-14 MED ORDER — LANCET DEVICE MISC
1.0000 | Freq: Three times a day (TID) | 0 refills | Status: AC
  Filled 2024-03-14: qty 1, 30d supply, fill #0

## 2024-03-14 MED ORDER — LANCETS 33G MISC
1.0000 | Freq: Three times a day (TID) | 0 refills | Status: AC
Start: 2024-03-14 — End: 2024-04-13
  Filled 2024-03-14: qty 100, 30d supply, fill #0

## 2024-03-14 MED ORDER — BLOOD GLUCOSE TEST VI STRP
1.0000 | ORAL_STRIP | Freq: Three times a day (TID) | 0 refills | Status: DC
  Filled 2024-03-14: qty 100, 34d supply, fill #0

## 2024-03-14 MED ORDER — PANTOPRAZOLE SODIUM 20 MG PO TBEC
20.0000 mg | DELAYED_RELEASE_TABLET | Freq: Every day | ORAL | 11 refills | Status: AC
  Filled 2024-03-14: qty 30, 30d supply, fill #0
  Filled 2024-04-25: qty 30, 30d supply, fill #1
  Filled 2024-06-12: qty 30, 30d supply, fill #2
  Filled 2024-07-17: qty 30, 30d supply, fill #3
  Filled 2024-08-16: qty 30, 30d supply, fill #4
  Filled 2024-09-11: qty 30, 30d supply, fill #5
  Filled 2024-10-19: qty 30, 30d supply, fill #6
  Filled 2024-11-29: qty 30, 30d supply, fill #7
  Filled 2024-12-07: qty 30, 30d supply, fill #8

## 2024-03-14 MED ORDER — BLOOD GLUCOSE MONITOR SYSTEM W/DEVICE KIT
1.0000 | PACK | Freq: Three times a day (TID) | 0 refills | Status: AC
  Filled 2024-03-14: qty 1, 30d supply, fill #0

## 2024-03-14 NOTE — Telephone Encounter (Signed)
Rx sents

## 2024-03-14 NOTE — Addendum Note (Signed)
 Addended by: Ysidro Her on: 03/14/2024 11:06 AM   Modules accepted: Orders

## 2024-03-14 NOTE — Telephone Encounter (Signed)
 Rx sent.

## 2024-03-14 NOTE — Telephone Encounter (Signed)
 Copied from CRM [id number]. Topic: Clinical - Medication Question >> Mar 14, 2024  9:02 AM Michael Pearson wrote: Reason for CRM: patient Is calling in regards to his medication dr is suppose send in.

## 2024-03-20 ENCOUNTER — Ambulatory Visit (INDEPENDENT_AMBULATORY_CARE_PROVIDER_SITE_OTHER)

## 2024-03-20 ENCOUNTER — Other Ambulatory Visit: Payer: Self-pay | Admitting: Family Medicine

## 2024-03-20 VITALS — Ht 74.0 in | Wt 255.0 lb

## 2024-03-20 DIAGNOSIS — Z0000 Encounter for general adult medical examination without abnormal findings: Secondary | ICD-10-CM | POA: Diagnosis not present

## 2024-03-20 MED ORDER — RYBELSUS 14 MG PO TABS
1.0000 | ORAL_TABLET | Freq: Every day | ORAL | 0 refills | Status: DC
  Filled 2024-04-04: qty 30, 30d supply, fill #0

## 2024-03-20 NOTE — Telephone Encounter (Signed)
 Copied from CRM [id number]. Topic: Clinical - Medication Refill >> Mar 20, 2024 10:10 AM Jalayah J wrote: Medication: RYBELSUS  14 MG TABS  Has the patient contacted their pharmacy? Yes (Agent: If no, request that the patient contact the pharmacy for the refill. If patient does not wish to contact the pharmacy document the reason why and proceed with request.) (Agent: If yes, when and what did the pharmacy advise?)  This is the patient's preferred pharmacy:  [facility] [address], [place] - [address] AT [address] [place] [address] Phone: [phone] Fax: [phone]    Is this the correct pharmacy for this prescription? Yes If no, delete pharmacy and type the correct one.   Has the prescription been filled recently? Yes  Is the patient out of the medication? No  Has the patient been seen for an appointment in the last year OR does the patient have an upcoming appointment? Yes  Can we respond through MyChart? Yes  Agent: Please be advised that Rx refills may take up to 3 business days. We ask that you follow-up with your pharmacy.  Cans from [facility] called to request refill for pt.

## 2024-03-20 NOTE — Patient Instructions (Signed)
 [name] , Thank you for taking time out of your busy schedule to complete your Annual Wellness Visit with me. I enjoyed our conversation and look forward to speaking with you again next year. I, as well as your care team,  appreciate your ongoing commitment to your health goals. Please review the following plan we discussed and let me know if I can assist you in the future. Your Game plan/ To Do List    Follow up Visits: Next Medicare AWV with our clinical staff: In 1 year    Have you seen your provider in the last 6 months (3 months if uncontrolled diabetes)? Yes Next Office Visit with your provider: 06/14/24 @ 1:00  Clinician Recommendations:  Aim for 30 minutes of exercise or brisk walking, 6-8 glasses of water, and 5 servings of fruits and vegetables each day.       This is a list of the screening recommended for you and due dates:  Health Maintenance  Topic Date Due   Zoster (Shingles) Vaccine (1 of 2) Never done   COVID-19 Vaccine (2 - 2024-25 season) 07/03/2023   Flu Shot  06/01/2024   Hemoglobin A1C  06/11/2024   Eye exam for diabetics  07/06/2024   Complete foot exam   11/21/2024   Yearly kidney function blood test for diabetes  12/12/2024   Yearly kidney health urinalysis for diabetes  12/12/2024   Medicare Annual Wellness Visit  03/20/2025   DTaP/Tdap/Td vaccine (3 - Td or Tdap) 12/27/2025   Colon Cancer Screening  06/26/2028   Pneumococcal Vaccination  Completed   Hepatitis C Screening  Completed   HIV Screening  Completed   HPV Vaccine  Aged Out   Meningitis B Vaccine  Aged Out    Advanced directives: (ACP Link)Information on Advanced Care Planning can be found at [employer] Advance Health Care Directives Advance Health Care Directives. [email or web address]   Advance Care Planning is important because it:  [x]  Makes sure you receive the medical care that is consistent with your values, goals, and preferences  [x]  It provides guidance to your family and  loved ones and reduces their decisional burden about whether or not they are making the right decisions based on your wishes.  Follow the link provided in your after visit summary or read over the paperwork we have mailed to you to help you started getting your Advance Directives in place. If you need assistance in completing these, please reach out to us  so that we can help you!  See attachments for Preventive Care and Fall Prevention Tips.

## 2024-03-20 NOTE — Progress Notes (Signed)
 Subjective:   Michael Pearson is a 63 y.o. who presents for a Medicare Wellness preventive visit.  As a reminder, Annual Wellness Visits don't include a physical exam, and some assessments may be limited, especially if this visit is performed virtually. We may recommend an in-person follow-up visit with your provider if needed.  Visit Complete: Virtual I connected with  Michael Pearson on 03/20/24 by a audio enabled telemedicine application and verified that I am speaking with the correct person using two identifiers.  Patient Location: Home  Provider Location: Home Office  I discussed the limitations of evaluation and management by telemedicine. The patient expressed understanding and agreed to proceed.  Vital Signs: Because this visit was a virtual/telehealth visit, some criteria may be missing or patient reported. Any vitals not documented were not able to be obtained and vitals that have been documented are patient reported.  VideoDeclined- This patient declined Librarian, academic. Therefore the visit was completed with audio only.  Persons Participating in Visit: Patient.  AWV Questionnaire: No: Patient Medicare AWV questionnaire was not completed prior to this visit.  Cardiac Risk Factors include: advanced age (>32men, >20 women);diabetes mellitus;dyslipidemia;hypertension;male gender     Objective:     Today's Vitals   03/20/24 1418  Weight: 255 lb (115.7 kg)  Height: 6\' 2"  (1.88 m)   Body mass index is 32.74 kg/m.     03/20/2024    2:51 PM 12/11/2023   12:58 PM 11/02/2022   12:41 PM 07/14/2022    2:13 PM 10/15/2021    8:49 AM 07/09/2021    3:47 PM 05/20/2021    8:53 PM  Advanced Directives  Does Patient Have a Medical Advance Directive? No No No No No No No  Would patient like information on creating a medical advance directive? Yes (MAU/Ambulatory/Procedural Areas - Information given) No - Patient declined  No - Patient declined No - Patient  declined No - Patient declined No - Patient declined    Current Medications (verified) Outpatient Encounter Medications as of 03/20/2024  Medication Sig   albuterol  (VENTOLIN  HFA) 108 (90 Base) MCG/ACT inhaler Inhale 2 puffs into the lungs every 4 (four) hours as needed for wheezing or shortness of breath. (Rescue inhaler)   aspirin  EC 81 MG tablet Take 81 mg by mouth in the morning. Swallow whole.   atorvastatin  (LIPITOR) 40 MG tablet Take 1 tablet (40 mg total) by mouth daily.   Blood Glucose Monitoring Suppl (BLOOD GLUCOSE MONITOR SYSTEM) w/Device KIT Use to test blood sugar in the morning, at noon, and at bedtime.   Blood Glucose Monitoring Suppl (ONETOUCH VERIO FLEX SYSTEM) w/Device KIT Use to check blood glucose once a day (Dx: type 2 DM E11.65)   cabergoline  (DOSTINEX ) 0.5 MG tablet Take 0.5 tablets (0.25 mg total) by mouth 2 (two) times a week.   cetirizine  (ZYRTEC ) 10 MG tablet Take 10 mg by mouth daily as needed for allergies.    diclofenac  Sodium (VOLTAREN ) 1 % GEL Apply 4 g topically 4 (four) times daily.   docusate sodium  (COLACE) 100 MG capsule Take 100 mg by mouth 2 (two) times daily as needed (constipation.).   esomeprazole  (NEXIUM ) 40 MG capsule Take 1 capsule (40 mg total) by mouth daily at 12 noon.   ezetimibe  (ZETIA ) 10 MG tablet Take 1 tablet (10 mg total) by mouth daily.   fluticasone -salmeterol (WIXELA INHUB) 250-50 MCG/ACT AEPB Inhale 1 puff into the lungs in the morning and at bedtime. Replaces Trelegy inhaler  as Maintenance inhaler   glucose blood (ACCU-CHEK GUIDE TEST) test strip Check blood sugars once daily   Glucose Blood (BLOOD GLUCOSE TEST STRIPS) STRP Use to test blood sugar in the morning, at noon and at bedtime.   Lancet Device MISC 1 each by Does not apply route in the morning, at noon, and at bedtime. May substitute to any manufacturer covered by patient's insurance.   Lancets (ONETOUCH DELICA PLUS LANCET33G) MISC Use to check blood glucose once a day (Dx:  type 2 DM E11.65)   Lancets 33G MISC Use to test blood sugar in the morning, at noon, and at bedtime.   losartan  (COZAAR ) 50 MG tablet TAKE 1 TABLET(50 MG) BY MOUTH DAILY   meclizine  (ANTIVERT ) 25 MG tablet Take 1 tablet (25 mg total) by mouth 3 (three) times daily as needed for dizziness.   metFORMIN  (GLUCOPHAGE -XR) 500 MG 24 hr tablet Take 2 tablets (1,000 mg total) by mouth 2 (two) times daily with a meal.   Multiple Vitamin (MULTIVITAMIN WITH MINERALS) TABS tablet Take 1 tablet by mouth in the morning.   Semaglutide  (RYBELSUS ) 14 MG TABS Take 1 tablet (14 mg total) by mouth daily.   silver  sulfADIAZINE  (SILVADENE ) 1 % cream Apply 1 Application topically daily.   pantoprazole  (PROTONIX ) 20 MG tablet Take 1 tablet (20 mg total) by mouth daily. (Patient not taking: Reported on 03/20/2024)   No facility-administered encounter medications on file as of 03/20/2024.    Allergies (verified) Patient has no known allergies.   History: Past Medical History:  Diagnosis Date   Allergy    Arthritis    Asthma    CAD (coronary artery disease)    NSTEMI 7/12:  Cardiac cath on 7/17: pLAD occluded, Dx 30-40%, pRCA 30-40%, mRCA 30-40%.  Proximal LAD was treated with a BMS.  Echo 7/19:  EF 60-65%, mild LVH.    ETT-Myoview  6/14:  Inf thinning, no ischemia, EF 64%, normal study // Myoview  04/2020: EF 57, normal perfusion; Low Risk     Cognitive communication deficit    Colitis    Diverticulosis    Duodenitis    May 2012 with heme positive stools at this time. Endorses only rare streaking of blood now.   External hemorrhoids    GERD (gastroesophageal reflux disease)    Heart murmur    as a child per the pt   Hiatal hernia    Hx of hemorrhoids    Hyperlipidemia    Hypertension    Kidney stones    Microadenoma    MRI in 2008 demonstrating 4.6 mm area of pituitary gland    Migraines    Has been evaluated multiple times in past for chronic headache in which pt has had occasional nose bleeds and  bloodshot eyes. This lasted for 4-5 months. CT of the head was negative in May 2012.   Myocardial infarction (HCC) 05/17/2011   Pituitary tumor    RBBB (right bundle branch block)    Noted on EKG in 2008   Ulcer    Past Surgical History:  Procedure Laterality Date   BIOPSY  10/15/2021   Procedure: BIOPSY;  Surgeon: Nannette Babe, MD;  Location: [facility];  Service: Gastroenterology;;   COLONOSCOPY     egd  03/05/2011   Dr. Nickey Barn   ESOPHAGOGASTRODUODENOSCOPY (EGD) WITH PROPOFOL  N/A 10/15/2021   Procedure: ESOPHAGOGASTRODUODENOSCOPY (EGD) WITH PROPOFOL ;  Surgeon: Nannette Babe, MD;  Location: [facility];  Service: Gastroenterology;  Laterality: N/A;  possible dilation   FLEXIBLE  SIGMOIDOSCOPY  03/05/2011   Dr. Nickey Barn   HAMMER TOE SURGERY     Heart stent     HERNIA REPAIR     In 20's   MALONEY DILATION  10/15/2021   Procedure: MALONEY DILATION;  Surgeon: Nannette Babe, MD;  Location: [facility];  Service: Gastroenterology;;  52cm    ORIF TIBIA PLATEAU Left 08/04/2018   Procedure: OPEN REDUCTION INTERNAL FIXATION (ORIF) TIBIAL PLATEAU;  Surgeon: Hardy Lia, MD;  Location: [facility];  Service: Orthopedics;  Laterality: Left;   SIGMOIDOSCOPY     TOOTH EXTRACTION     UPPER GASTROINTESTINAL ENDOSCOPY     Family History  Problem Relation Age of Onset   Stroke Mother    Heart attack Father 66       MI   Skin cancer Father    Cancer Father        ? type "rare"   Obesity Brother    Coronary artery disease Brother        Possible, pt not sure of specifics   Hypertension Brother    Colon polyps Maternal Aunt    Colon cancer Maternal Aunt    Colon polyps Paternal Grandmother    Other Neg Hx    Esophageal cancer Neg Hx    Rectal cancer Neg Hx    Stomach cancer Neg Hx    Social History   Socioeconomic History   Marital status: Single    Spouse name: Not on file   Number of children: Not on file   Years of education: Not on file   Highest education level: Not on file   Occupational History   Occupation: TEFL teacher-- cleans 3 buildings    Comment: Fairly physical job    Employer: [employer]  Tobacco Use   Smoking status: Never   Smokeless tobacco: Never  Vaping Use   Vaping status: Never Used  Substance and Sexual Activity   Alcohol use: No   Drug use: No   Sexual activity: Not Currently  Other Topics Concern   Not on file  Social History Narrative   Lives with brother and mother   Mother is quite sick, he and his brother take turns taking care of her   No children   Social Drivers of Corporate investment banker Strain: Low Risk  (03/20/2024)   Overall Financial Resource Strain (CARDIA)    Difficulty of Paying Living Expenses: Not hard at all  Food Insecurity: No Food Insecurity (03/20/2024)   Hunger Vital Sign    Worried About Running Out of Food in the Last Year: Never true    Ran Out of Food in the Last Year: Never true  Transportation Needs: No Transportation Needs (03/20/2024)   PRAPARE - Administrator, Civil Service (Medical): No    Lack of Transportation (Non-Medical): No  Physical Activity: Sufficiently Active (03/20/2024)   Exercise Vital Sign    Days of Exercise per Week: 5 days    Minutes of Exercise per Session: 30 min  Stress: No Stress Concern Present (03/20/2024)   [employer] of Occupational Health - Occupational Stress Questionnaire    Feeling of Stress : Not at all  Social Connections: Socially Isolated (03/20/2024)   Social Connection and Isolation Panel [NHANES]    Frequency of Communication with Friends and Family: More than three times a week    Frequency of Social Gatherings with Friends and Family: More than three times a week    Attends Religious Services: Never  Active Member of Clubs or Organizations: No    Attends Banker Meetings: Never    Marital Status: Never married    Tobacco Counseling Counseling given: Not Answered    Clinical Intake:  Pre-visit  preparation completed: Yes  Pain : No/denies pain     Diabetes: Yes CBG done?: No Did pt. bring in CBG monitor from home?: No  Lab Results  Component Value Date   HGBA1C 6.9 (H) 12/13/2023   HGBA1C 8.1 (H) 04/05/2023   HGBA1C 8.1 (H) 12/30/2022     How often do you need to have someone help you when you read instructions, pamphlets, or other written materials from your doctor or pharmacy?: 1 - Never  Interpreter Needed?: No  Information entered by :: Michael Cypress LPN   Activities of Daily Living     03/20/2024    2:48 PM  In your present state of health, do you have any difficulty performing the following activities:  Hearing? 0  Vision? 0  Difficulty concentrating or making decisions? 0  Walking or climbing stairs? 0  Dressing or bathing? 0  Doing errands, shopping? 0  Preparing Food and eating ? N  Using the Toilet? N  In the past six months, have you accidently leaked urine? N  Do you have problems with loss of bowel control? N  Managing your Medications? N  Managing your Finances? N  Housekeeping or managing your Housekeeping? N    Patient Care Team: Crecencio Dodge, Candida Chalk, DO as PCP - General (Family Medicine) Arnoldo Lapping, MD as PCP - Cardiology (Cardiology) Kristopher Pheasant, MD as Consulting Physician (Cardiology) Hardy Lia, MD as Consulting Physician (Orthopedic Surgery) Dermatology, Adair Actis, Rickie Chars, MD as Consulting Physician (Pulmonary Disease)  Indicate any recent Medical Services you may have received from other than [facility] providers in the past year (date may be approximate).     Assessment:    This is a routine wellness examination for Michael Pearson.  Hearing/Vision screen Hearing Screening - Comments:: Denies hearing difficulties   Vision Screening - Comments:: Wears rx glasses - up to date with [facility] exams with [facility]    Goals Addressed             This Visit's Progress    Patient Stated   On track     Continue eating healthy foods       Depression Screen     03/20/2024    2:50 PM 01/25/2023   11:37 AM 08/19/2022   10:10 AM 07/14/2022    2:14 PM 07/09/2021    3:50 PM 06/08/2021    8:44 AM 11/24/2017    9:39 AM  PHQ 2/9 Scores  PHQ - 2 Score 0 0 0 0 0 2 0  PHQ- 9 Score      2     Fall Risk    03/20/2024    2:52 PM 01/25/2023   11:38 AM 08/19/2022   10:10 AM 07/14/2022    2:14 PM 07/09/2021    3:49 PM  Fall Risk   Falls in the past year? 0 0 0 0 0  Number falls in past yr: 0 0 0 0 0  Injury with Fall? 0  0 0 0  Risk for fall due to : No Fall Risks Medication side effect  No Fall Risks   Follow up Falls prevention discussed;Education provided;Falls evaluation completed Falls prevention discussed Falls evaluation completed Falls evaluation completed Falls prevention discussed    MEDICARE RISK  AT HOME:  Medicare Risk at Home Any stairs in or around the home?: No If so, are there any without handrails?: No Home free of loose throw rugs in walkways, pet beds, electrical cords, etc?: Yes Adequate lighting in your home to reduce risk of falls?: Yes Life alert?: No Use of a cane, walker or w/c?: No Grab bars in the bathroom?: Yes Shower chair or bench in shower?: No Elevated toilet seat or a handicapped toilet?: Yes  TIMED UP AND GO:  Was the test performed?  No  Cognitive Function: 6CIT completed        03/20/2024    2:52 PM 07/14/2022    2:20 PM  6CIT Screen  What Year? 0 points 0 points  What month? 0 points 0 points  What time? 0 points 0 points  Count back from 20 0 points 2 points  Months in reverse 2 points 4 points  Repeat phrase 4 points 6 points  Total Score 6 points 12 points    Immunizations Immunization History  Administered Date(s) Administered   Influenza Whole 09/01/2012   Influenza,inj,Quad PF,6+ Mos 09/17/2014, 08/14/2015, 11/23/2016, 08/05/2018, 08/15/2019, 07/31/2020   Influenza-Unspecified 09/16/2021, 09/20/2023   Janssen (J&J) SARS-COV-2  Vaccination 08/05/2020   PNEUMOCOCCAL CONJUGATE-20 12/13/2023   Pneumococcal Conjugate-13 09/17/2014   Tdap 05/15/2012, 12/28/2015    Screening Tests Health Maintenance  Topic Date Due   Zoster Vaccines- Shingrix (1 of 2) Never done   COVID-19 Vaccine (2 - 2024-25 season) 07/03/2023   INFLUENZA VACCINE  06/01/2024   HEMOGLOBIN A1C  06/11/2024   OPHTHALMOLOGY EXAM  07/06/2024   FOOT EXAM  11/21/2024   Diabetic kidney evaluation - eGFR measurement  12/12/2024   Diabetic kidney evaluation - Urine ACR  12/12/2024   Medicare Annual Wellness (AWV)  03/20/2025   DTaP/Tdap/Td (3 - Td or Tdap) 12/27/2025   Colonoscopy  06/26/2028   Pneumococcal Vaccine 55-9 Years old  Completed   Hepatitis C Screening  Completed   HIV Screening  Completed   HPV VACCINES  Aged Out   Meningococcal B Vaccine  Aged Out    Health Maintenance  Health Maintenance Due  Topic Date Due   Zoster Vaccines- Shingrix (1 of 2) Never done   COVID-19 Vaccine (2 - 2024-25 season) 07/03/2023   Health Maintenance Items Addressed: Information provided on Shingrix   Additional Screening:  Vision Screening: Recommended annual ophthalmology exams for early detection of glaucoma and other disorders of the eye.  Dental Screening: Recommended annual dental exams for proper oral hygiene  Community Resource Referral / Chronic Care Management: CRR required this visit?  No   CCM required this visit?  No   Plan:    I have personally reviewed and noted the following in the patient's chart:   Medical and social history Use of alcohol, tobacco or illicit drugs  Current medications and supplements including opioid prescriptions. Patient is not currently taking opioid prescriptions. Functional ability and status Nutritional status Physical activity Advanced directives List of other physicians Hospitalizations, surgeries, and ER visits in previous 12 months Vitals Screenings to include cognitive, depression, and  falls Referrals and appointments  In addition, I have reviewed and discussed with patient certain preventive protocols, quality metrics, and best practice recommendations. A written personalized care plan for preventive services as well as general preventive health recommendations were provided to patient.   Michael Pearson, [place]   1/61/0960   After Visit Summary: (MyChart) Due to this being a telephonic visit, the after visit summary  with patients personalized plan was offered to patient via MyChart   Notes: Nothing significant to report at this time.

## 2024-04-04 ENCOUNTER — Other Ambulatory Visit (HOSPITAL_COMMUNITY): Payer: Self-pay

## 2024-04-24 ENCOUNTER — Other Ambulatory Visit (HOSPITAL_COMMUNITY): Payer: Self-pay

## 2024-04-25 ENCOUNTER — Other Ambulatory Visit (HOSPITAL_COMMUNITY): Payer: Self-pay

## 2024-04-25 MED ORDER — ESOMEPRAZOLE MAGNESIUM 40 MG PO CPDR
40.0000 mg | DELAYED_RELEASE_CAPSULE | Freq: Every day | ORAL | 11 refills | Status: AC
  Filled 2024-04-25: qty 30, 30d supply, fill #0
  Filled 2024-06-12 – 2024-06-13 (×4): qty 30, 30d supply, fill #1

## 2024-04-25 MED ORDER — ACCU-CHEK GUIDE TEST VI STRP
1.0000 | ORAL_STRIP | 12 refills | Status: DC
  Filled 2024-04-25: qty 100, 90d supply, fill #0

## 2024-04-25 MED FILL — Losartan Potassium Tab 50 MG: ORAL | 90 days supply | Qty: 90 | Fill #0 | Status: AC

## 2024-05-02 ENCOUNTER — Other Ambulatory Visit: Payer: Self-pay | Admitting: "Endocrinology

## 2024-05-05 ENCOUNTER — Other Ambulatory Visit: Payer: Self-pay

## 2024-05-05 ENCOUNTER — Other Ambulatory Visit: Payer: Self-pay | Admitting: Family Medicine

## 2024-05-07 ENCOUNTER — Other Ambulatory Visit: Payer: Self-pay | Admitting: Family Medicine

## 2024-05-07 ENCOUNTER — Other Ambulatory Visit: Payer: Self-pay

## 2024-05-07 MED FILL — Semaglutide Tab 14 MG: ORAL | 30 days supply | Qty: 30 | Fill #0 | Status: CN

## 2024-05-09 ENCOUNTER — Other Ambulatory Visit: Payer: Self-pay

## 2024-05-09 ENCOUNTER — Other Ambulatory Visit (HOSPITAL_COMMUNITY): Payer: Self-pay

## 2024-05-09 MED FILL — Semaglutide Tab 14 MG: ORAL | 30 days supply | Qty: 30 | Fill #0 | Status: AC

## 2024-05-16 ENCOUNTER — Encounter: Payer: Self-pay | Admitting: Pulmonary Disease

## 2024-05-16 ENCOUNTER — Ambulatory Visit (INDEPENDENT_AMBULATORY_CARE_PROVIDER_SITE_OTHER): Admitting: Pulmonary Disease

## 2024-05-16 VITALS — BP 106/62 | HR 91 | Ht 74.0 in | Wt 216.4 lb

## 2024-05-16 DIAGNOSIS — G4733 Obstructive sleep apnea (adult) (pediatric): Secondary | ICD-10-CM | POA: Diagnosis not present

## 2024-05-16 DIAGNOSIS — Z6827 Body mass index (BMI) 27.0-27.9, adult: Secondary | ICD-10-CM | POA: Diagnosis not present

## 2024-05-16 DIAGNOSIS — E669 Obesity, unspecified: Secondary | ICD-10-CM

## 2024-05-16 NOTE — Patient Instructions (Signed)
 Continue using your CPAP on a nightly basis  Continue exercising regularly  Call us  with significant concerns  I will see you about 6 months from here

## 2024-05-16 NOTE — Progress Notes (Signed)
 Michael Pearson    [id number]    17-Feb-1961  Primary Care Physician:Lowne Chase, [name], DO  Referring Physician: Antonio Meth, [name], DO [address] [place],  [place] [address]  Chief complaint:   Patient with severe obstructive sleep apnea  HPI:  Has been doing well with his CPAP  Denies any significant complaints Sleeping and waking up feeling like he is at a good nights rest  Has been using CPAP since 2022 Tolerating it well Waking up feeling like he is at a good nights rest  He does have a dry mouth, dry eyes in the morning  Health has been stable  He does try to walk in the mornings Cannot tolerate the hot weather and humidity during the day  Denies any chest pains or chest discomfort Activity tolerance limited by his neuropathic pain, problems with his feet   Outpatient Encounter Medications as of 05/16/2024  Medication Sig   albuterol  (VENTOLIN  HFA) 108 (90 Base) MCG/ACT inhaler Inhale 2 puffs into the lungs every 4 (four) hours as needed for wheezing or shortness of breath. (Rescue inhaler)   aspirin  EC 81 MG tablet Take 81 mg by mouth in the morning. Swallow whole.   atorvastatin  (LIPITOR) 40 MG tablet Take 1 tablet (40 mg total) by mouth daily.   Blood Glucose Monitoring Suppl (BLOOD GLUCOSE MONITOR SYSTEM) w/Device KIT Use to test blood sugar in the morning, at noon, and at bedtime.   Blood Glucose Monitoring Suppl (ONETOUCH VERIO FLEX SYSTEM) w/Device KIT Use to check blood glucose once a day (Dx: type 2 DM E11.65)   cabergoline  (DOSTINEX ) 0.5 MG tablet TAKE HALF TABLET BY MOUTH 2 TIMES A WEEK   cetirizine  (ZYRTEC ) 10 MG tablet Take 10 mg by mouth daily as needed for allergies.    diclofenac  Sodium (VOLTAREN ) 1 % GEL Apply 4 g topically 4 (four) times daily.   docusate sodium  (COLACE) 100 MG capsule Take 100 mg by mouth 2 (two) times daily as needed (constipation.).   esomeprazole  (NEXIUM ) 40 MG capsule Take 1 capsule (40 mg total)  by mouth daily at 12 noon.   esomeprazole  (NEXIUM ) 40 MG capsule Take 1 capsule (40 mg total) by mouth daily at 12 noon.   ezetimibe  (ZETIA ) 10 MG tablet Take 1 tablet (10 mg total) by mouth daily.   fluticasone -salmeterol (WIXELA INHUB) 250-50 MCG/ACT AEPB Inhale 1 puff into the lungs in the morning and at bedtime. Replaces Trelegy inhaler as Maintenance inhaler   glucose blood (ACCU-CHEK GUIDE TEST) test strip Check blood sugars once daily   glucose blood (ACCU-CHEK GUIDE TEST) test strip Use 1 strip to test sugar level once a day.   Glucose Blood (BLOOD GLUCOSE TEST STRIPS) STRP Use to test blood sugar in the morning, at noon and at bedtime.   Lancets (ONETOUCH DELICA PLUS LANCET33G) MISC Use to check blood glucose once a day (Dx: type 2 DM E11.65)   losartan  (COZAAR ) 50 MG tablet Take 1 tablet (50 mg total) by mouth daily.   meclizine  (ANTIVERT ) 25 MG tablet Take 1 tablet (25 mg total) by mouth 3 (three) times daily as needed for dizziness.   metFORMIN  (GLUCOPHAGE -XR) 500 MG 24 hr tablet Take 2 tablets (1,000 mg total) by mouth 2 (two) times daily with a meal.   Multiple Vitamin (MULTIVITAMIN WITH MINERALS) TABS tablet Take 1 tablet by mouth in the morning.   pantoprazole  (PROTONIX ) 20 MG tablet Take 1  tablet (20 mg total) by mouth daily.   Semaglutide  (RYBELSUS ) 14 MG TABS Take 1 tablet (14 mg total) by mouth daily. Please schedule appointment for labwork & further evaluation.   silver  sulfADIAZINE  (SILVADENE ) 1 % cream Apply 1 Application topically daily.   No facility-administered encounter medications on file as of 05/16/2024.    Allergies as of 05/16/2024   (No Known Allergies)    Past Medical History:  Diagnosis Date   Allergy    Arthritis    Asthma    CAD (coronary artery disease)    NSTEMI 7/12:  Cardiac cath on 7/17: pLAD occluded, Dx 30-40%, pRCA 30-40%, mRCA 30-40%.  Proximal LAD was treated with a BMS.  Echo 7/19:  EF 60-65%, mild LVH.    ETT-Myoview  6/14:  Inf thinning,  no ischemia, EF 64%, normal study // Myoview  04/2020: EF 57, normal perfusion; Low Risk     Cognitive communication deficit    Colitis    Diverticulosis    Duodenitis    May 2012 with heme positive stools at this time. Endorses only rare streaking of blood now.   External hemorrhoids    GERD (gastroesophageal reflux disease)    Heart murmur    as a child per the pt   Hiatal hernia    Hx of hemorrhoids    Hyperlipidemia    Hypertension    Kidney stones    Microadenoma    MRI in 2008 demonstrating 4.6 mm area of pituitary gland    Migraines    Has been evaluated multiple times in past for chronic headache in which pt has had occasional nose bleeds and bloodshot eyes. This lasted for 4-5 months. CT of the head was negative in May 2012.   Myocardial infarction (HCC) 05/17/2011   Pituitary tumor    RBBB (right bundle branch block)    Noted on EKG in 2008   Ulcer     Past Surgical History:  Procedure Laterality Date   BIOPSY  10/15/2021   Procedure: BIOPSY;  Surgeon: Albertus Gordy HERO, MD;  Location: [facility];  Service: Gastroenterology;;   COLONOSCOPY     egd  03/05/2011   Dr. Rollin   ESOPHAGOGASTRODUODENOSCOPY (EGD) WITH PROPOFOL  N/A 10/15/2021   Procedure: ESOPHAGOGASTRODUODENOSCOPY (EGD) WITH PROPOFOL ;  Surgeon: Albertus Gordy HERO, MD;  Location: [facility];  Service: Gastroenterology;  Laterality: N/A;  possible dilation   FLEXIBLE SIGMOIDOSCOPY  03/05/2011   Dr. Rollin   HAMMER TOE SURGERY     Heart stent     HERNIA REPAIR     In 20's   MALONEY DILATION  10/15/2021   Procedure: MALONEY DILATION;  Surgeon: Albertus Gordy HERO, MD;  Location: [facility];  Service: Gastroenterology;;  52cm    ORIF TIBIA PLATEAU Left 08/04/2018   Procedure: OPEN REDUCTION INTERNAL FIXATION (ORIF) TIBIAL PLATEAU;  Surgeon: Celena Sharper, MD;  Location: [facility];  Service: Orthopedics;  Laterality: Left;   SIGMOIDOSCOPY     TOOTH EXTRACTION     UPPER GASTROINTESTINAL ENDOSCOPY      Family History  Problem  Relation Age of Onset   Stroke Mother    Heart attack Father 63       MI   Skin cancer Father    Cancer Father        ? type rare   Obesity Brother    Coronary artery disease Brother        Possible, pt not sure of specifics   Hypertension Brother    Colon polyps  Maternal Aunt    Colon cancer Maternal Aunt    Colon polyps Paternal Grandmother    Other Neg Hx    Esophageal cancer Neg Hx    Rectal cancer Neg Hx    Stomach cancer Neg Hx     Social History   Socioeconomic History   Marital status: Single    Spouse name: Not on file   Number of children: Not on file   Years of education: Not on file   Highest education level: Not on file  Occupational History   Occupation: TEFL teacher-- cleans 3 buildings    Comment: Fairly physical job    Employer: [employer]  Tobacco Use   Smoking status: Never   Smokeless tobacco: Never  Vaping Use   Vaping status: Never Used  Substance and Sexual Activity   Alcohol use: No   Drug use: No   Sexual activity: Not Currently  Other Topics Concern   Not on file  Social History Narrative   Lives with brother and mother   Mother is quite sick, he and his brother take turns taking care of her   No children   Social Drivers of Corporate investment banker Strain: Low Risk  (03/20/2024)   Overall Financial Resource Strain (CARDIA)    Difficulty of Paying Living Expenses: Not hard at all  Food Insecurity: No Food Insecurity (03/20/2024)   Hunger Vital Sign    Worried About Running Out of Food in the Last Year: Never true    Ran Out of Food in the Last Year: Never true  Transportation Needs: No Transportation Needs (03/20/2024)   PRAPARE - Administrator, Civil Service (Medical): No    Lack of Transportation (Non-Medical): No  Physical Activity: Sufficiently Active (03/20/2024)   Exercise Vital Sign    Days of Exercise per Week: 5 days    Minutes of Exercise per Session: 30 min  Stress: No Stress Concern Present  (03/20/2024)   [employer] of Occupational Health - Occupational Stress Questionnaire    Feeling of Stress : Not at all  Social Connections: Socially Isolated (03/20/2024)   Social Connection and Isolation Panel    Frequency of Communication with Friends and Family: More than three times a week    Frequency of Social Gatherings with Friends and Family: More than three times a week    Attends Religious Services: Never    Database administrator or Organizations: No    Attends Banker Meetings: Never    Marital Status: Never married  Intimate Partner Violence: Not At Risk (03/20/2024)   Humiliation, Afraid, Rape, and Kick questionnaire    Fear of Current or Ex-Partner: No    Emotionally Abused: No    Physically Abused: No    Sexually Abused: No    Review of Systems  Constitutional:  Negative for fatigue.  Respiratory:  Positive for apnea and shortness of breath.   Psychiatric/Behavioral:  Positive for sleep disturbance.     Vitals:   05/16/24 1352  BP: 106/62  Pulse: 91  SpO2: 96%     Physical Exam Constitutional:      Appearance: He is obese.  HENT:     Right Ear: Tympanic membrane normal.     Mouth/Throat:     Mouth: Mucous membranes are moist.     Comments: Micrognathia, crowded oropharynx, Mallampati 4 Eyes:     General: No scleral icterus. Cardiovascular:     Rate and Rhythm: Normal rate and  regular rhythm.     Heart sounds: No murmur heard.    No friction rub.  Pulmonary:     Effort: No respiratory distress.     Breath sounds: No stridor. No wheezing or rhonchi.  Musculoskeletal:     Cervical back: No rigidity or tenderness.  Neurological:     Mental Status: He is alert.     Cranial Nerves: No cranial nerve deficit.  Psychiatric:        Mood and Affect: Mood normal.    Data Reviewed: Sleep study from 2022 reviewed showing severe obstructive sleep apnea  CPAP compliance reviewed showing good compliance with CPAP at 98% Average use of 7  hours 39 minutes CPAP of 16 with EPR of 2 Residual AHI is still high at 9.1 but overall patient feels better  Assessment:   Severe obstructive sleep apnea Compliant with treatment - AHI still marginally elevated but patient overall is feeling better  Class I obesity - Continue to work on weight loss efforts  Plan/Recommendations:  Continue CPAP  Continue weight loss efforts  Follow-up in about 6 months  Encouraged to call with significant concerns   Jennet Epley MD [facility] 05/16/2024, 2:14 PM  CC: Antonio Cyndee Jamee JONELLE, *

## 2024-05-21 ENCOUNTER — Telehealth: Payer: Self-pay | Admitting: Family Medicine

## 2024-05-21 NOTE — Telephone Encounter (Signed)
 Pt can in needing paper work filled out by [employer]. Placed in pcps box. Please call pt when complete to pick up.

## 2024-05-21 NOTE — Telephone Encounter (Signed)
Received. Placed in folder for completion

## 2024-05-22 ENCOUNTER — Encounter: Payer: Self-pay | Admitting: Family Medicine

## 2024-05-25 NOTE — Telephone Encounter (Signed)
 Pt made aware of letter and will be coming to pick it up today

## 2024-06-12 ENCOUNTER — Other Ambulatory Visit (HOSPITAL_COMMUNITY): Payer: Self-pay

## 2024-06-12 ENCOUNTER — Other Ambulatory Visit: Payer: Self-pay | Admitting: Family Medicine

## 2024-06-12 ENCOUNTER — Telehealth (HOSPITAL_COMMUNITY): Payer: Self-pay

## 2024-06-12 ENCOUNTER — Other Ambulatory Visit (HOSPITAL_BASED_OUTPATIENT_CLINIC_OR_DEPARTMENT_OTHER): Payer: Self-pay

## 2024-06-12 MED ORDER — RYBELSUS 14 MG PO TABS
1.0000 | ORAL_TABLET | Freq: Every day | ORAL | 2 refills | Status: DC
  Filled 2024-06-12: qty 30, 30d supply, fill #0
  Filled 2024-07-17: qty 30, 30d supply, fill #1
  Filled 2024-08-16: qty 30, 30d supply, fill #2

## 2024-06-12 NOTE — Telephone Encounter (Signed)
 PA request has been Cancelled. New Encounter has been or will be created for follow up. For additional info see Pharmacy Prior Auth telephone encounter from 06/12/24.

## 2024-06-13 ENCOUNTER — Other Ambulatory Visit (HOSPITAL_COMMUNITY): Payer: Self-pay

## 2024-06-13 DIAGNOSIS — E119 Type 2 diabetes mellitus without complications: Secondary | ICD-10-CM | POA: Diagnosis not present

## 2024-06-13 LAB — HM DIABETES EYE EXAM

## 2024-06-14 ENCOUNTER — Other Ambulatory Visit (HOSPITAL_COMMUNITY): Payer: Self-pay

## 2024-06-14 ENCOUNTER — Ambulatory Visit (INDEPENDENT_AMBULATORY_CARE_PROVIDER_SITE_OTHER): Payer: No Typology Code available for payment source | Admitting: Family Medicine

## 2024-06-14 ENCOUNTER — Encounter: Payer: Self-pay | Admitting: Family Medicine

## 2024-06-14 VITALS — BP 110/78 | HR 94 | Temp 98.2°F | Resp 18 | Ht 74.0 in | Wt 258.4 lb

## 2024-06-14 DIAGNOSIS — J4520 Mild intermittent asthma, uncomplicated: Secondary | ICD-10-CM

## 2024-06-14 DIAGNOSIS — R41841 Cognitive communication deficit: Secondary | ICD-10-CM | POA: Diagnosis not present

## 2024-06-14 DIAGNOSIS — I25119 Atherosclerotic heart disease of native coronary artery with unspecified angina pectoris: Secondary | ICD-10-CM

## 2024-06-14 DIAGNOSIS — Z7984 Long term (current) use of oral hypoglycemic drugs: Secondary | ICD-10-CM | POA: Diagnosis not present

## 2024-06-14 DIAGNOSIS — I2510 Atherosclerotic heart disease of native coronary artery without angina pectoris: Secondary | ICD-10-CM | POA: Diagnosis not present

## 2024-06-14 DIAGNOSIS — I10 Essential (primary) hypertension: Secondary | ICD-10-CM | POA: Diagnosis not present

## 2024-06-14 DIAGNOSIS — K219 Gastro-esophageal reflux disease without esophagitis: Secondary | ICD-10-CM

## 2024-06-14 DIAGNOSIS — E1165 Type 2 diabetes mellitus with hyperglycemia: Secondary | ICD-10-CM | POA: Diagnosis not present

## 2024-06-14 DIAGNOSIS — E221 Hyperprolactinemia: Secondary | ICD-10-CM

## 2024-06-14 DIAGNOSIS — E785 Hyperlipidemia, unspecified: Secondary | ICD-10-CM

## 2024-06-14 DIAGNOSIS — D352 Benign neoplasm of pituitary gland: Secondary | ICD-10-CM | POA: Diagnosis not present

## 2024-06-14 LAB — GLUCOSE, POCT (MANUAL RESULT ENTRY): POC Glucose: 152 mg/dL — AB (ref 70–99)

## 2024-06-14 LAB — MICROALBUMIN / CREATININE URINE RATIO
Creatinine,U: 163.4 mg/dL
Microalb Creat Ratio: 15.5 mg/g (ref 0.0–30.0)
Microalb, Ur: 2.5 mg/dL — ABNORMAL HIGH (ref 0.0–1.9)

## 2024-06-14 MED ORDER — BLOOD GLUCOSE TEST VI STRP
1.0000 | ORAL_STRIP | Freq: Three times a day (TID) | 0 refills | Status: DC
  Filled 2024-06-14: qty 100, 30d supply, fill #0

## 2024-06-14 MED ORDER — ONETOUCH DELICA PLUS LANCET33G MISC
5 refills | Status: AC
Start: 2024-06-14 — End: ?
  Filled 2024-06-14: qty 100, 90d supply, fill #0
  Filled 2024-10-19: qty 100, 90d supply, fill #1

## 2024-06-14 NOTE — Assessment & Plan Note (Signed)
 Check labs today Glucose in office 156

## 2024-06-14 NOTE — Patient Instructions (Signed)

## 2024-06-14 NOTE — Assessment & Plan Note (Signed)
 stable

## 2024-06-14 NOTE — Assessment & Plan Note (Signed)
 Per endo

## 2024-06-14 NOTE — Assessment & Plan Note (Signed)
 Well controlled, no changes to meds. Encouraged heart healthy diet such as the DASH diet and exercise as tolerated.

## 2024-06-14 NOTE — Progress Notes (Signed)
 +   Subjective:    Patient ID: Michael Pearson, male    DOB: [birth year], 64 y.o.   MRN: [id number]  Chief Complaint  Patient presents with   Diabetes   Hyperlipidemia   Hypertension    HPI Patient is in today for f/u dm, chol and htn.  Discussed the use of AI scribe software for clinical note transcription with the patient, who gave verbal consent to proceed.  History of Present Illness Michael Pearson is a 63 year old male with diabetes who presents with elevated blood sugar levels and foot pain.  He has been experiencing foot pain, described as a boring ache on one side, accompanied by numbness. He attributes some of the discomfort to weather changes and arthritis, and is currently under the care of a podiatrist for this issue.  Over the past couple of weeks, he has noticed intermittent sharp abdominal pain. Additionally, he reports a foul smell, like 'rotten fish', when urinating.  His blood sugar levels have been higher than usual, with readings over 300 mg/dL on a couple of nights. He is taking Rybelsus  and metformin , although one of his medications was recently changed due to insurance coverage issues. He has cut out sweets, fried foods, and grease from his diet, and has been drinking 7-8 bottles of water daily. He also drinks unsweetened tea and is trying to find a specific green tea that he believes helps reduce sugar levels.  He has been busy with multiple medical appointments this week, including visits to a hearing doctor and an eye doctor. He mentions that he has moved his prescriptions to a different pharmacy for convenience.  He recently lost a pet dog, which has been emotionally challenging for him.    Past Medical History:  Diagnosis Date   Allergy    Arthritis    Asthma    CAD (coronary artery disease)    NSTEMI 7/12:  Cardiac cath on 7/17: pLAD occluded, Dx 30-40%, pRCA 30-40%, mRCA 30-40%.  Proximal LAD was treated with a BMS.  Echo 7/19:  EF 60-65%, mild LVH.     ETT-Myoview  6/14:  Inf thinning, no ischemia, EF 64%, normal study // Myoview  04/2020: EF 57, normal perfusion; Low Risk     Cognitive communication deficit    Colitis    Diverticulosis    Duodenitis    May 2012 with heme positive stools at this time. Endorses only rare streaking of blood now.   External hemorrhoids    GERD (gastroesophageal reflux disease)    Heart murmur    as a child per the pt   Hiatal hernia    Hx of hemorrhoids    Hyperlipidemia    Hypertension    Kidney stones    Microadenoma    MRI in 2008 demonstrating 4.6 mm area of pituitary gland    Migraines    Has been evaluated multiple times in past for chronic headache in which pt has had occasional nose bleeds and bloodshot eyes. This lasted for 4-5 months. CT of the head was negative in May 2012.   Myocardial infarction (HCC) 05/17/2011   Pituitary tumor    RBBB (right bundle branch block)    Noted on EKG in 2008   Ulcer     Past Surgical History:  Procedure Laterality Date   BIOPSY  10/15/2021   Procedure: BIOPSY;  Surgeon: Albertus Gordy HERO, MD;  Location: [facility];  Service: Gastroenterology;;   COLONOSCOPY     egd  03/05/2011  Dr. Rollin   ESOPHAGOGASTRODUODENOSCOPY (EGD) WITH PROPOFOL  N/A 10/15/2021   Procedure: ESOPHAGOGASTRODUODENOSCOPY (EGD) WITH PROPOFOL ;  Surgeon: Albertus Gordy HERO, MD;  Location: [facility];  Service: Gastroenterology;  Laterality: N/A;  possible dilation   FLEXIBLE SIGMOIDOSCOPY  03/05/2011   Dr. Rollin   HAMMER TOE SURGERY     Heart stent     HERNIA REPAIR     In 20's   MALONEY DILATION  10/15/2021   Procedure: MALONEY DILATION;  Surgeon: Albertus Gordy HERO, MD;  Location: [facility];  Service: Gastroenterology;;  52cm    ORIF TIBIA PLATEAU Left 08/04/2018   Procedure: OPEN REDUCTION INTERNAL FIXATION (ORIF) TIBIAL PLATEAU;  Surgeon: Celena Sharper, MD;  Location: [facility];  Service: Orthopedics;  Laterality: Left;   SIGMOIDOSCOPY     TOOTH EXTRACTION     UPPER GASTROINTESTINAL ENDOSCOPY       Family History  Problem Relation Age of Onset   Stroke Mother    Heart attack Father 61       MI   Skin cancer Father    Cancer Father        ? type rare   Obesity Brother    Coronary artery disease Brother        Possible, pt not sure of specifics   Hypertension Brother    Colon polyps Maternal Aunt    Colon cancer Maternal Aunt    Colon polyps Paternal Grandmother    Other Neg Hx    Esophageal cancer Neg Hx    Rectal cancer Neg Hx    Stomach cancer Neg Hx     Social History   Socioeconomic History   Marital status: Single    Spouse name: Not on file   Number of children: Not on file   Years of education: Not on file   Highest education level: Not on file  Occupational History   Occupation: TEFL teacher-- cleans 3 buildings    Comment: Fairly physical job    Employer: [employer]  Tobacco Use   Smoking status: Never   Smokeless tobacco: Never  Vaping Use   Vaping status: Never Used  Substance and Sexual Activity   Alcohol use: No   Drug use: No   Sexual activity: Not Currently  Other Topics Concern   Not on file  Social History Narrative   Lives with brother and mother   Mother is quite sick, he and his brother take turns taking care of her   No children   Social Drivers of Corporate investment banker Strain: Low Risk  (03/20/2024)   Overall Financial Resource Strain (CARDIA)    Difficulty of Paying Living Expenses: Not hard at all  Food Insecurity: No Food Insecurity (03/20/2024)   Hunger Vital Sign    Worried About Running Out of Food in the Last Year: Never true    Ran Out of Food in the Last Year: Never true  Transportation Needs: No Transportation Needs (03/20/2024)   PRAPARE - Administrator, Civil Service (Medical): No    Lack of Transportation (Non-Medical): No  Physical Activity: Sufficiently Active (03/20/2024)   Exercise Vital Sign    Days of Exercise per Week: 5 days    Minutes of Exercise per Session: 30 min   Stress: No Stress Concern Present (03/20/2024)   [employer] of Occupational Health - Occupational Stress Questionnaire    Feeling of Stress : Not at all  Social Connections: Socially Isolated (03/20/2024)   Social Connection and Isolation  Panel    Frequency of Communication with Friends and Family: More than three times a week    Frequency of Social Gatherings with Friends and Family: More than three times a week    Attends Religious Services: Never    Database administrator or Organizations: No    Attends Banker Meetings: Never    Marital Status: Never married  Intimate Partner Violence: Not At Risk (03/20/2024)   Humiliation, Afraid, Rape, and Kick questionnaire    Fear of Current or Ex-Partner: No    Emotionally Abused: No    Physically Abused: No    Sexually Abused: No    Outpatient Medications Prior to Visit  Medication Sig Dispense Refill   albuterol  (VENTOLIN  HFA) 108 (90 Base) MCG/ACT inhaler Inhale 2 puffs into the lungs every 4 (four) hours as needed for wheezing or shortness of breath. (Rescue inhaler) 1 each 2   aspirin  EC 81 MG tablet Take 81 mg by mouth in the morning. Swallow whole.     atorvastatin  (LIPITOR) 40 MG tablet Take 1 tablet (40 mg total) by mouth daily. 90 tablet 3   Blood Glucose Monitoring Suppl (BLOOD GLUCOSE MONITOR SYSTEM) w/Device KIT Use to test blood sugar in the morning, at noon, and at bedtime. 1 kit 0   Blood Glucose Monitoring Suppl (ONETOUCH VERIO FLEX SYSTEM) w/Device KIT Use to check blood glucose once a day (Dx: type 2 DM E11.65) 1 kit 0   cabergoline  (DOSTINEX ) 0.5 MG tablet TAKE HALF TABLET BY MOUTH 2 TIMES A WEEK 8 tablet 2   cetirizine  (ZYRTEC ) 10 MG tablet Take 10 mg by mouth daily as needed for allergies.      diclofenac  Sodium (VOLTAREN ) 1 % GEL Apply 4 g topically 4 (four) times daily. 100 g 2   docusate sodium  (COLACE) 100 MG capsule Take 100 mg by mouth 2 (two) times daily as needed (constipation.).      esomeprazole  (NEXIUM ) 40 MG capsule Take 1 capsule (40 mg total) by mouth daily at 12 noon. 30 capsule 11   esomeprazole  (NEXIUM ) 40 MG capsule Take 1 capsule (40 mg total) by mouth daily at 12 noon. 30 capsule 11   ezetimibe  (ZETIA ) 10 MG tablet Take 1 tablet (10 mg total) by mouth daily. 90 tablet 3   fluticasone -salmeterol (WIXELA INHUB) 250-50 MCG/ACT AEPB Inhale 1 puff into the lungs in the morning and at bedtime. Replaces Trelegy inhaler as Maintenance inhaler 60 each 4   glucose blood (ACCU-CHEK GUIDE TEST) test strip Check blood sugars once daily 100 strip 12   glucose blood (ACCU-CHEK GUIDE TEST) test strip Use 1 strip to test sugar level once a day. 100 strip 12   losartan  (COZAAR ) 50 MG tablet Take 1 tablet (50 mg total) by mouth daily. 90 tablet 3   meclizine  (ANTIVERT ) 25 MG tablet Take 1 tablet (25 mg total) by mouth 3 (three) times daily as needed for dizziness. 21 tablet 0   metFORMIN  (GLUCOPHAGE -XR) 500 MG 24 hr tablet Take 2 tablets (1,000 mg total) by mouth 2 (two) times daily with a meal. 360 tablet 1   Multiple Vitamin (MULTIVITAMIN WITH MINERALS) TABS tablet Take 1 tablet by mouth in the morning.     pantoprazole  (PROTONIX ) 20 MG tablet Take 1 tablet (20 mg total) by mouth daily. 30 tablet 11   Semaglutide  (RYBELSUS ) 14 MG TABS Take 1 tablet (14 mg total) by mouth daily. Please schedule appointment for labwork & further evaluation. 30 tablet 2  silver  sulfADIAZINE  (SILVADENE ) 1 % cream Apply 1 Application topically daily. 50 g 0   Glucose Blood (BLOOD GLUCOSE TEST STRIPS) STRP Use to test blood sugar in the morning, at noon and at bedtime. 100 strip 0   Lancets (ONETOUCH DELICA PLUS LANCET33G) MISC Use to check blood glucose once a day (Dx: type 2 DM E11.65) 100 each 5   No facility-administered medications prior to visit.    No Known Allergies  Review of Systems  Constitutional:  Negative for chills, fever and malaise/fatigue.  HENT:  Negative for congestion and hearing  loss.   Eyes:  Negative for blurred vision and discharge.  Respiratory:  Negative for cough, sputum production and shortness of breath.   Cardiovascular:  Negative for chest pain, palpitations and leg swelling.  Gastrointestinal:  Negative for abdominal pain, blood in stool, constipation, diarrhea, heartburn, nausea and vomiting.  Genitourinary:  Negative for dysuria, frequency, hematuria and urgency.  Musculoskeletal:  Negative for back pain, falls and myalgias.  Skin:  Negative for rash.  Neurological:  Negative for dizziness, sensory change, loss of consciousness, weakness and headaches.  Endo/Heme/Allergies:  Negative for environmental allergies. Does not bruise/bleed easily.  Psychiatric/Behavioral:  Negative for depression and suicidal ideas. The patient is not nervous/anxious and does not have insomnia.        Objective:    Physical Exam Vitals and nursing note reviewed.  Constitutional:      General: He is not in acute distress.    Appearance: Normal appearance. He is well-developed.  HENT:     Head: Normocephalic and atraumatic.     Right Ear: Tympanic membrane, ear canal and external ear normal. There is no impacted cerumen.     Left Ear: Tympanic membrane, ear canal and external ear normal. There is no impacted cerumen.     Nose: Nose normal.     Mouth/Throat:     Mouth: Mucous membranes are moist.     Pharynx: Oropharynx is clear. No oropharyngeal exudate or posterior oropharyngeal erythema.  Eyes:     General: No scleral icterus.       Right eye: No discharge.        Left eye: No discharge.     Conjunctiva/sclera: Conjunctivae normal.     Pupils: Pupils are equal, round, and reactive to light.  Neck:     Thyroid : No thyromegaly.     Vascular: No JVD.  Cardiovascular:     Rate and Rhythm: Normal rate and regular rhythm.     Heart sounds: Normal heart sounds. No murmur heard. Pulmonary:     Effort: Pulmonary effort is normal. No respiratory distress.     Breath  sounds: Normal breath sounds.  Abdominal:     General: Bowel sounds are normal. There is no distension.     Palpations: Abdomen is soft. There is no mass.     Tenderness: There is no abdominal tenderness. There is no guarding or rebound.  Musculoskeletal:        General: Normal range of motion.     Cervical back: Normal range of motion and neck supple.     Right lower leg: No edema.     Left lower leg: No edema.  Lymphadenopathy:     Cervical: No cervical adenopathy.  Skin:    General: Skin is warm and dry.     Findings: No erythema or rash.  Neurological:     Mental Status: He is alert and oriented to person, place, and time.  Cranial Nerves: No cranial nerve deficit.     Motor: No abnormal muscle tone.     Deep Tendon Reflexes: Reflexes are normal and symmetric. Reflexes normal.  Psychiatric:        Mood and Affect: Mood normal.        Behavior: Behavior normal.        Thought Content: Thought content normal.        Judgment: Judgment normal.     BP 110/78 (BP Location: Left Arm, Patient Position: Sitting, Cuff Size: Large)   Pulse 94   Temp 98.2 F (36.8 C) (Oral)   Resp 18   Ht 6' 2 (1.88 m)   Wt 258 lb 6.4 oz (117.2 kg)   SpO2 94%   BMI 33.18 kg/m  Wt Readings from Last 3 Encounters:  06/14/24 258 lb 6.4 oz (117.2 kg)  05/16/24 216 lb 6.4 oz (98.2 kg)  03/20/24 255 lb (115.7 kg)    Diabetic Foot Exam - Simple   No data filed    Lab Results  Component Value Date   WBC 6.2 12/13/2023   HGB 15.2 12/13/2023   HCT 46.0 12/13/2023   PLT 190.0 12/13/2023   GLUCOSE 105 (H) 12/13/2023   CHOL 96 12/13/2023   TRIG 82.0 12/13/2023   HDL 50.30 12/13/2023   LDLCALC 29 12/13/2023   ALT 33 12/13/2023   AST 24 12/13/2023   NA 140 12/13/2023   K 4.5 12/13/2023   CL 100 12/13/2023   CREATININE 0.76 12/13/2023   BUN 12 12/13/2023   CO2 32 12/13/2023   TSH 1.75 03/31/2023   PSA 0.73 06/25/2022   INR 0.97 05/17/2011   HGBA1C 6.9 (H) 12/13/2023   MICROALBUR  <0.7 12/13/2023    Lab Results  Component Value Date   TSH 1.75 03/31/2023   Lab Results  Component Value Date   WBC 6.2 12/13/2023   HGB 15.2 12/13/2023   HCT 46.0 12/13/2023   MCV 97.4 12/13/2023   PLT 190.0 12/13/2023   Lab Results  Component Value Date   NA 140 12/13/2023   K 4.5 12/13/2023   CO2 32 12/13/2023   GLUCOSE 105 (H) 12/13/2023   BUN 12 12/13/2023   CREATININE 0.76 12/13/2023   BILITOT 0.6 12/13/2023   ALKPHOS 49 12/13/2023   AST 24 12/13/2023   ALT 33 12/13/2023   PROT 7.2 12/13/2023   ALBUMIN 4.6 12/13/2023   CALCIUM  9.7 12/13/2023   ANIONGAP 14 12/11/2023   GFR 96.03 12/13/2023   Lab Results  Component Value Date   CHOL 96 12/13/2023   Lab Results  Component Value Date   HDL 50.30 12/13/2023   Lab Results  Component Value Date   LDLCALC 29 12/13/2023   Lab Results  Component Value Date   TRIG 82.0 12/13/2023   Lab Results  Component Value Date   CHOLHDL 2 12/13/2023   Lab Results  Component Value Date   HGBA1C 6.9 (H) 12/13/2023       Assessment & Plan:  Gastroesophageal reflux disease, unspecified whether esophagitis present Assessment & Plan: stable   Hyperlipidemia, unspecified hyperlipidemia type Assessment & Plan: Encourage heart healthy diet such as MIND or DASH diet, increase exercise, avoid trans fats, simple carbohydrates and processed foods, consider a krill or fish or flaxseed oil cap daily.    Orders: -     Lipid panel -     Comprehensive metabolic panel with GFR  Coronary artery disease involving native coronary artery of native heart without angina  pectoris -     Lipid panel -     CBC with Differential/Platelet -     Comprehensive metabolic panel with GFR  Type 2 diabetes mellitus with hyperglycemia, without long-term current use of insulin  (HCC) Assessment & Plan: Check labs today Glucose in office 156   Orders: -     Lipid panel -     CBC with Differential/Platelet -     Comprehensive metabolic  panel with GFR -     Hemoglobin A1c -     Microalbumin / creatinine urine ratio -     Blood Glucose Test; Use to test blood sugar in the morning, at noon and at bedtime.  Dispense: 100 strip; Refill: 0 -     OneTouch Delica Plus Lancet33G; Use to check blood glucose once a day (Dx: type 2 DM E11.65)  Dispense: 100 each; Refill: 5 -     POCT glucose (manual entry)  Mild intermittent asthma without complication Assessment & Plan: stable   Essential hypertension Assessment & Plan: Well controlled, no changes to meds. Encouraged heart healthy diet such as the DASH diet and exercise as tolerated.    Orders: -     Lipid panel -     CBC with Differential/Platelet -     Comprehensive metabolic panel with GFR  Hyperprolactinemia (HCC) -     Prolactin  Atherosclerosis of native coronary artery of native heart with angina pectoris (HCC)  Cognitive communication deficit  Pituitary adenoma [facility]) Assessment & Plan: Per endo   Assessment and Plan Assessment & Plan Type 2 diabetes mellitus with poor glycemic control   Blood sugar levels exceed 300 mg/dL despite adherence to Rybelsus  and metformin . Recent medication changes due to insurance may affect control. He reports dietary modifications and increased water intake. He reports a rotten fish smell in urine. Check blood glucose level in office. Ensure availability of test strips and lancets. Encourage continued dietary modifications and hydration.  Foot pain and numbness   Chronic foot pain and numbness may be due to diabetic neuropathy or arthritis, with symptoms influenced by weather changes. He is under podiatric care.    Lanard Arguijo R Lowne Chase, DO

## 2024-06-14 NOTE — Assessment & Plan Note (Signed)
 Encourage heart healthy diet such as MIND or DASH diet, increase exercise, avoid trans fats, simple carbohydrates and processed foods, consider a krill or fish or flaxseed oil cap daily.

## 2024-06-15 LAB — CBC WITH DIFFERENTIAL/PLATELET
Basophils Absolute: 0 K/uL (ref 0.0–0.1)
Basophils Relative: 0.7 % (ref 0.0–3.0)
Eosinophils Absolute: 0.1 K/uL (ref 0.0–0.7)
Eosinophils Relative: 2 % (ref 0.0–5.0)
HCT: 44.3 % (ref 39.0–52.0)
Hemoglobin: 14.6 g/dL (ref 13.0–17.0)
Lymphocytes Relative: 31.3 % (ref 12.0–46.0)
Lymphs Abs: 2 K/uL (ref 0.7–4.0)
MCHC: 33 g/dL (ref 30.0–36.0)
MCV: 96.1 fl (ref 78.0–100.0)
Monocytes Absolute: 0.5 K/uL (ref 0.1–1.0)
Monocytes Relative: 7.4 % (ref 3.0–12.0)
Neutro Abs: 3.7 K/uL (ref 1.4–7.7)
Neutrophils Relative %: 58.6 % (ref 43.0–77.0)
Platelets: 200 K/uL (ref 150.0–400.0)
RBC: 4.61 Mil/uL (ref 4.22–5.81)
RDW: 14.3 % (ref 11.5–15.5)
WBC: 6.4 K/uL (ref 4.0–10.5)

## 2024-06-15 LAB — COMPREHENSIVE METABOLIC PANEL WITH GFR
ALT: 44 U/L (ref 0–53)
AST: 34 U/L (ref 0–37)
Albumin: 4.6 g/dL (ref 3.5–5.2)
Alkaline Phosphatase: 39 U/L (ref 39–117)
BUN: 16 mg/dL (ref 6–23)
CO2: 29 meq/L (ref 19–32)
Calcium: 10 mg/dL (ref 8.4–10.5)
Chloride: 101 meq/L (ref 96–112)
Creatinine, Ser: 0.9 mg/dL (ref 0.40–1.50)
GFR: 90.92 mL/min (ref >60.00)
Glucose, Bld: 109 mg/dL — ABNORMAL HIGH (ref 70–99)
Potassium: 4.7 meq/L (ref 3.5–5.1)
Sodium: 141 meq/L (ref 135–145)
Total Bilirubin: 0.4 mg/dL (ref 0.2–1.2)
Total Protein: 6.8 g/dL (ref 6.0–8.3)

## 2024-06-15 LAB — HEMOGLOBIN A1C: Hgb A1c MFr Bld: 7.2 % — ABNORMAL HIGH (ref 4.6–6.5)

## 2024-06-15 LAB — LIPID PANEL
Cholesterol: 89 mg/dL (ref 0–200)
HDL: 43.3 mg/dL (ref >39.00)
LDL Cholesterol: 29 mg/dL (ref 0–99)
NonHDL: 45.83
Total CHOL/HDL Ratio: 2
Triglycerides: 84 mg/dL (ref 0.0–149.0)
VLDL: 16.8 mg/dL (ref 0.0–40.0)

## 2024-06-15 LAB — PROLACTIN: Prolactin: 12.9 ng/mL (ref 2.0–18.0)

## 2024-06-19 ENCOUNTER — Ambulatory Visit: Payer: Self-pay | Admitting: Family Medicine

## 2024-06-19 ENCOUNTER — Other Ambulatory Visit: Payer: Self-pay | Admitting: Family Medicine

## 2024-06-19 DIAGNOSIS — E785 Hyperlipidemia, unspecified: Secondary | ICD-10-CM

## 2024-06-19 DIAGNOSIS — E1165 Type 2 diabetes mellitus with hyperglycemia: Secondary | ICD-10-CM

## 2024-06-19 DIAGNOSIS — I10 Essential (primary) hypertension: Secondary | ICD-10-CM

## 2024-06-19 DIAGNOSIS — I2510 Atherosclerotic heart disease of native coronary artery without angina pectoris: Secondary | ICD-10-CM

## 2024-06-22 ENCOUNTER — Other Ambulatory Visit (HOSPITAL_COMMUNITY): Payer: Self-pay

## 2024-06-22 MED ORDER — EMPAGLIFLOZIN 10 MG PO TABS
10.0000 mg | ORAL_TABLET | Freq: Every day | ORAL | 2 refills | Status: DC
  Filled 2024-06-22 – 2024-06-23 (×4): qty 30, 30d supply, fill #0
  Filled 2024-07-17: qty 30, 30d supply, fill #1
  Filled 2024-08-16: qty 30, 30d supply, fill #2

## 2024-06-23 ENCOUNTER — Other Ambulatory Visit (HOSPITAL_COMMUNITY): Payer: Self-pay

## 2024-06-25 ENCOUNTER — Other Ambulatory Visit: Payer: Self-pay

## 2024-07-04 ENCOUNTER — Other Ambulatory Visit (HOSPITAL_COMMUNITY): Payer: Self-pay

## 2024-07-17 ENCOUNTER — Other Ambulatory Visit: Payer: Self-pay

## 2024-07-17 ENCOUNTER — Other Ambulatory Visit (HOSPITAL_COMMUNITY): Payer: Self-pay

## 2024-07-17 MED FILL — Losartan Potassium Tab 50 MG: ORAL | 90 days supply | Qty: 90 | Fill #1 | Status: AC

## 2024-07-25 ENCOUNTER — Other Ambulatory Visit (INDEPENDENT_AMBULATORY_CARE_PROVIDER_SITE_OTHER)

## 2024-07-25 DIAGNOSIS — I10 Essential (primary) hypertension: Secondary | ICD-10-CM | POA: Diagnosis not present

## 2024-07-25 DIAGNOSIS — E1165 Type 2 diabetes mellitus with hyperglycemia: Secondary | ICD-10-CM

## 2024-07-25 DIAGNOSIS — E785 Hyperlipidemia, unspecified: Secondary | ICD-10-CM | POA: Diagnosis not present

## 2024-07-25 DIAGNOSIS — I2510 Atherosclerotic heart disease of native coronary artery without angina pectoris: Secondary | ICD-10-CM

## 2024-07-25 LAB — CBC WITH DIFFERENTIAL/PLATELET
Basophils Absolute: 0 K/uL (ref 0.0–0.1)
Basophils Relative: 0.5 % (ref 0.0–3.0)
Eosinophils Absolute: 0.1 K/uL (ref 0.0–0.7)
Eosinophils Relative: 2.1 % (ref 0.0–5.0)
HCT: 45.1 % (ref 39.0–52.0)
Hemoglobin: 14.8 g/dL (ref 13.0–17.0)
Lymphocytes Relative: 33.7 % (ref 12.0–46.0)
Lymphs Abs: 1.7 K/uL (ref 0.7–4.0)
MCHC: 32.7 g/dL (ref 30.0–36.0)
MCV: 96 fl (ref 78.0–100.0)
Monocytes Absolute: 0.4 K/uL (ref 0.1–1.0)
Monocytes Relative: 7.3 % (ref 3.0–12.0)
Neutro Abs: 2.9 K/uL (ref 1.4–7.7)
Neutrophils Relative %: 56.4 % (ref 43.0–77.0)
Platelets: 182 K/uL (ref 150.0–400.0)
RBC: 4.7 Mil/uL (ref 4.22–5.81)
RDW: 14.3 % (ref 11.5–15.5)
WBC: 5.1 K/uL (ref 4.0–10.5)

## 2024-07-25 LAB — COMPREHENSIVE METABOLIC PANEL WITH GFR
ALT: 32 U/L (ref 0–53)
AST: 23 U/L (ref 0–37)
Albumin: 4.7 g/dL (ref 3.5–5.2)
Alkaline Phosphatase: 36 U/L — ABNORMAL LOW (ref 39–117)
BUN: 15 mg/dL (ref 6–23)
CO2: 31 meq/L (ref 19–32)
Calcium: 10.2 mg/dL (ref 8.4–10.5)
Chloride: 100 meq/L (ref 96–112)
Creatinine, Ser: 0.74 mg/dL (ref 0.40–1.50)
GFR: 96.39 mL/min (ref >60.00)
Glucose, Bld: 91 mg/dL (ref 70–99)
Potassium: 4.7 meq/L (ref 3.5–5.1)
Sodium: 140 meq/L (ref 135–145)
Total Bilirubin: 0.8 mg/dL (ref 0.2–1.2)
Total Protein: 6.7 g/dL (ref 6.0–8.3)

## 2024-07-25 LAB — LIPID PANEL
Cholesterol: 88 mg/dL (ref 0–200)
HDL: 42.9 mg/dL (ref >39.00)
LDL Cholesterol: 32 mg/dL (ref 0–99)
NonHDL: 44.62
Total CHOL/HDL Ratio: 2
Triglycerides: 63 mg/dL (ref 0.0–149.0)
VLDL: 12.6 mg/dL (ref 0.0–40.0)

## 2024-07-25 LAB — MICROALBUMIN / CREATININE URINE RATIO
Creatinine,U: 60.9 mg/dL
Microalb Creat Ratio: 16.9 mg/g (ref 0.0–30.0)
Microalb, Ur: 1 mg/dL (ref 0.0–1.9)

## 2024-07-29 ENCOUNTER — Ambulatory Visit: Payer: Self-pay | Admitting: Family Medicine

## 2024-08-16 ENCOUNTER — Other Ambulatory Visit (HOSPITAL_COMMUNITY): Payer: Self-pay

## 2024-09-10 ENCOUNTER — Other Ambulatory Visit: Payer: Self-pay

## 2024-09-10 ENCOUNTER — Emergency Department (HOSPITAL_BASED_OUTPATIENT_CLINIC_OR_DEPARTMENT_OTHER)
Admission: EM | Admit: 2024-09-10 | Discharge: 2024-09-10 | Disposition: A | Discharge status: Home / Self Care | Attending: Emergency Medicine | Admitting: Emergency Medicine

## 2024-09-10 ENCOUNTER — Emergency Department (HOSPITAL_BASED_OUTPATIENT_CLINIC_OR_DEPARTMENT_OTHER)

## 2024-09-10 ENCOUNTER — Encounter (HOSPITAL_BASED_OUTPATIENT_CLINIC_OR_DEPARTMENT_OTHER): Payer: Self-pay

## 2024-09-10 ENCOUNTER — Ambulatory Visit: Payer: Self-pay | Admitting: Family Medicine

## 2024-09-10 DIAGNOSIS — E119 Type 2 diabetes mellitus without complications: Secondary | ICD-10-CM | POA: Diagnosis not present

## 2024-09-10 DIAGNOSIS — S62635A Displaced fracture of distal phalanx of left ring finger, initial encounter for closed fracture: Secondary | ICD-10-CM | POA: Insufficient documentation

## 2024-09-10 DIAGNOSIS — S60142A Contusion of left ring finger with damage to nail, initial encounter: Secondary | ICD-10-CM | POA: Insufficient documentation

## 2024-09-10 DIAGNOSIS — I10 Essential (primary) hypertension: Secondary | ICD-10-CM | POA: Insufficient documentation

## 2024-09-10 DIAGNOSIS — W231XXA Caught, crushed, jammed, or pinched between stationary objects, initial encounter: Secondary | ICD-10-CM | POA: Insufficient documentation

## 2024-09-10 DIAGNOSIS — S60945A Unspecified superficial injury of left ring finger, initial encounter: Secondary | ICD-10-CM | POA: Diagnosis present

## 2024-09-10 DIAGNOSIS — Z7984 Long term (current) use of oral hypoglycemic drugs: Secondary | ICD-10-CM | POA: Diagnosis not present

## 2024-09-10 MED ORDER — OXYCODONE-ACETAMINOPHEN 5-325 MG PO TABS
1.0000 | ORAL_TABLET | Freq: Once | ORAL | Status: AC
  Administered 2024-09-10: 1 via ORAL
  Filled 2024-09-10: qty 1

## 2024-09-10 MED ORDER — OXYCODONE HCL 5 MG PO TABS
5.0000 mg | ORAL_TABLET | Freq: Four times a day (QID) | ORAL | 0 refills | Status: AC | PRN
  Filled 2024-09-10: qty 15, 4d supply, fill #0

## 2024-09-10 MED ORDER — ACETAMINOPHEN 500 MG PO TABS
500.0000 mg | ORAL_TABLET | Freq: Once | ORAL | Status: AC
  Administered 2024-09-10: 500 mg via ORAL
  Filled 2024-09-10: qty 1

## 2024-09-10 NOTE — ED Provider Notes (Signed)
 [place] [facility] Provider Note   CSN: [id number] Arrival date & time: 09/10/24  1843     Patient presents with: Finger Injury   Michael Pearson is a 63 y.o. male with history of hypertension, type 2 diabetes, presents with concern for shutting his left ring finger in his car door earlier today.  Reports immediate pain to the finger.  Reports some bleeding in his finger.  Denies any numbness in the finger.   HPI     Prior to Admission medications   Medication Sig Start Date End Date Taking? Authorizing Provider  oxyCODONE  (ROXICODONE ) 5 MG immediate release tablet Take 1 tablet (5 mg total) by mouth every 6 (six) hours as needed for severe pain (pain score 7-10) or breakthrough pain (Pain not controlled with Tylenol  and Ibuprofen ). 09/10/24  Yes Veta Palma, PA-C  albuterol  (VENTOLIN  HFA) 108 (90 Base) MCG/ACT inhaler Inhale 2 puffs into the lungs every 4 (four) hours as needed for wheezing or shortness of breath. (Rescue inhaler) 01/24/23 10/12/27  Antonio Meth, [name], DO  aspirin  EC 81 MG tablet Take 81 mg by mouth in the morning. Swallow whole.    [provider]  atorvastatin  (LIPITOR) 40 MG tablet Take 1 tablet (40 mg total) by mouth daily. 01/18/24   Lowne Chase, Yvonne R, DO  Blood Glucose Monitoring Suppl (BLOOD GLUCOSE MONITOR SYSTEM) w/Device KIT Use to test blood sugar in the morning, at noon, and at bedtime. 03/14/24   Lowne Chase, Yvonne R, DO  Blood Glucose Monitoring Suppl (ONETOUCH VERIO FLEX SYSTEM) w/Device KIT Use to check blood glucose once a day (Dx: type 2 DM E11.65) 02/23/23   Antonio Meth, Yvonne R, DO  cabergoline  (DOSTINEX ) 0.5 MG tablet TAKE HALF TABLET BY MOUTH 2 TIMES A WEEK 05/03/24   Motwani, Obadiah, MD  cetirizine  (ZYRTEC ) 10 MG tablet Take 10 mg by mouth daily as needed for allergies.     [provider]  diclofenac  Sodium (VOLTAREN ) 1 % GEL Apply 4 g topically 4 (four) times daily. 06/25/22   Antonio Meth [name], DO  docusate sodium  (COLACE) 100 MG capsule Take 100 mg by mouth 2 (two) times daily as needed (constipation.).    [provider]  empagliflozin  (JARDIANCE ) 10 MG TABS tablet Take 1 tablet (10 mg total) by mouth daily before breakfast. 06/22/24   Antonio Meth, [name], DO  esomeprazole  (NEXIUM ) 40 MG capsule Take 1 capsule (40 mg total) by mouth daily at 12 noon. 03/12/24   Antonio Meth [name], DO  esomeprazole  (NEXIUM ) 40 MG capsule Take 1 capsule (40 mg total) by mouth daily at 12 noon. 03/07/24   Antonio Meth [name], DO  ezetimibe  (ZETIA ) 10 MG tablet Take 1 tablet (10 mg total) by mouth daily. 01/18/24   Antonio Meth [name], DO  fluticasone -salmeterol (WIXELA INHUB) 250-50 MCG/ACT AEPB Inhale 1 puff into the lungs in the morning and at bedtime. Replaces Trelegy inhaler as Maintenance inhaler 07/27/23   Antonio Meth, Yvonne R, DO  glucose blood (ACCU-CHEK GUIDE TEST) test strip Check blood sugars once daily 03/12/24   Antonio Meth, Yvonne R, DO  glucose blood (ACCU-CHEK GUIDE TEST) test strip Use 1 strip to test sugar level once a day. 03/07/24   Lowne Chase, Yvonne R, DO  Glucose Blood (BLOOD GLUCOSE TEST STRIPS) STRP Use to test blood sugar in the morning, at noon and at bedtime. 06/14/24   Lowne Chase, Yvonne R, DO  Lancets (  ONETOUCH DELICA PLUS LANCET33G) MISC Use to check blood glucose once a day (Dx: type 2 DM E11.65) 06/14/24   Antonio Meth, [name], DO  losartan  (COZAAR ) 50 MG tablet Take 1 tablet (50 mg total) by mouth daily. 01/16/24   Antonio Meth [name], DO  meclizine  (ANTIVERT ) 25 MG tablet Take 1 tablet (25 mg total) by mouth 3 (three) times daily as needed for dizziness. 08/19/22   Paz, Jose E, MD  metFORMIN  (GLUCOPHAGE -XR) 500 MG 24 hr tablet Take 2 tablets (1,000 mg total) by mouth 2 (two) times daily with a meal. 12/13/23   Antonio Meth, Yvonne R, DO  Multiple Vitamin (MULTIVITAMIN WITH MINERALS) TABS tablet Take 1 tablet by mouth in the morning.    [provider]  pantoprazole  (PROTONIX ) 20 MG tablet Take 1 tablet (20 mg total) by mouth daily. 03/14/24   Antonio Meth Jamee R, DO  Semaglutide  (RYBELSUS ) 14 MG TABS Take 1 tablet (14 mg total) by mouth daily. Please schedule appointment for labwork & further evaluation. 06/12/24   Antonio Meth [name], DO  silver  sulfADIAZINE  (SILVADENE ) 1 % cream Apply 1 Application topically daily. 03/18/23   Tobie Franky SQUIBB, DPM    Allergies: Patient has no known allergies.    Review of Systems  Skin:  Positive for wound.    Updated Vital Signs BP (!) 153/96   Pulse 72   Temp (!) 97.5 F (36.4 C) (Oral)   Resp 18   SpO2 98%   Physical Exam Vitals and nursing note reviewed.  Constitutional:      Appearance: Normal appearance.  HENT:     Head: Atraumatic.  Pulmonary:     Effort: Pulmonary effort is normal.  Musculoskeletal:     Comments: Left upper extremity:  General Edema and ecchymosis to the distal aspect of the left ring finger.  Nailbed of the left ring finger about 50% filled with blood.  No open wounds or lacerations.  Palpation Tender over the distal phalanx of the left ring finger.  Nontender over the 1st through 5th metacarpals, 1st, 2nd, 3rd, and 5th phalanges  ROM Full flexion extension at the wrist Full flexion extension at the 1st through 5th MCPs, PIPs, DIPs  Sensation: Sensation intact throughout the 1st-5th digits   Neurological:     General: No focal deficit present.     Mental Status: He is alert.  Psychiatric:        Mood and Affect: Mood normal.        Behavior: Behavior normal.     (all labs ordered are listed, but only abnormal results are displayed) Labs Reviewed - No data to display  EKG: None  Radiology: DG Hand Complete Left Result Date: 09/10/2024 CLINICAL DATA:  Fourth digit injury. EXAM: LEFT HAND - COMPLETE 3+ VIEW COMPARISON:  None Available. FINDINGS: There is a comminuted nondisplaced fracture of the distal tuft of the fourth finger.  There some overlying soft tissue swelling. There is no dislocation. IMPRESSION: Comminuted nondisplaced fracture of the distal tuft of the fourth finger. Electronically Signed   By: Greig Pique M.D.   On: 09/10/2024 19:19     Nail Removal  Date/Time: 09/10/2024 8:06 PM  Performed by: Veta Palma, PA-C Authorized by: Veta Palma, PA-C   Consent:    Consent obtained:  Verbal   Consent given by:  Patient   Risks, benefits, and alternatives were discussed: yes     Risks discussed:  Infection, pain, bleeding and permanent nail deformity  Alternatives discussed:  No treatment Universal protocol:    Procedure explained and questions answered to patient or proxy's satisfaction: yes     Imaging studies available: yes     Patient identity confirmed:  Verbally with patient Pre-procedure details:    Skin preparation:  Chlorhexidine   Preparation: Patient was prepped and draped in the usual sterile fashion   Procedure details:    Location:  Hand   Hand location:  L ring finger Anesthesia:    Anesthesia method:  None Trephination:    Subungual hematoma drained: yes     Trephination instrument:  Cautery (Bovie) Post-procedure details:    Dressing:  Antibiotic ointment, 4x4 sterile gauze and splint   Procedure completion:  Tolerated well, no immediate complications Comments:     Only trephination performed    Medications Ordered in the ED  oxyCODONE -acetaminophen  (PERCOCET/ROXICET) 5-325 MG per tablet 1 tablet (1 tablet Oral Given 09/10/24 1936)  acetaminophen  (TYLENOL ) tablet 500 mg (500 mg Oral Given 09/10/24 1936)                                    Medical Decision Making Amount and/or Complexity of Data Reviewed Radiology: ordered.  Risk OTC drugs. Prescription drug management.     Differential diagnosis includes but is not limited to fracture, dislocation, nerve injury, tendon injury, vascular injury, subungual hematoma  ED Course:  Upon initial  evaluation, patient is well-appearing.  Reporting pain to the left ring finger after shutting it in his car door just prior to arrival.  On exam, he does have some edema and ecchymoses around the distal aspect of the left ring finger.  He has a subungual hematoma that comprises about 50% of the left ring finger nailbed.  No overlying lacerations or abrasions.  He is able to fully flex and extend at the 1st through 5th MCPs, PIP, DIPs of the left hand.  No concern for tendon injury at this time.  Reports intact sensation of the left index finger, no concern for nerve injury.   Imaging Studies ordered: I ordered imaging studies including x-ray left hand I independently visualized the imaging with scope of interpretation limited to determining acute life threatening conditions related to emergency care. Imaging showed  IMPRESSION:  Comminuted nondisplaced fracture of the distal tuft of the fourth  finger.   I agree with the radiologist interpretation    Medications Given: Percocet Tylenol   Upon re-evaluation, patient is well-appearing.  Pain controlled with the Percocet given here.  Given the subungual hematoma, trephination of the nailbed of the left ring finger was performed.  Patient tolerated this without difficulty.  All blood under the nailbed was expelled.  It did not appear that patient had any active bleeding under the nailbed, low concern for underlying nailbed injury.  His x-ray did show a distal tuft fracture of the left ring finger.  Patient's finger was wrapped with gauze and Coban, as well as a splint for the fracture.  Patient stable and appropriate for discharge home.    Impression: Closed nondisplaced tuft fracture of the distal phalanx of the left fourth finger Subungual hematoma  Disposition:  The patient was discharged home with instructions to call to schedule follow-up appointment with Dr. Alyse within the next week for further management of his finger fracture.   Tylenol  and ibuprofen  as needed for pain. Oxycodone  for breakthrough pain.  He understands oxycodone  may make him drowsy  and do not drink alcohol or drive after taking this medication.  Wear finger splint provided at all times until cleared by orthopedics. Return precautions given and patient verbalized understanding.      This chart was dictated using voice recognition software, Dragon. Despite the best efforts of this provider to proofread and correct errors, errors may still occur which can change documentation meaning.       Final diagnoses:  Subungual hematoma of left ring finger  Closed fracture of tuft of distal phalanx of left ring finger    ED Discharge Orders          Ordered    oxyCODONE  (ROXICODONE ) 5 MG immediate release tablet  Every 6 hours PRN        09/10/24 2003               Veta Palma, DEVONNA 09/10/24 2009    Darra Fonda MATSU, MD 09/10/24 2124

## 2024-09-10 NOTE — ED Notes (Signed)
 Discharge instructions reviewed with patient. Patient questions answered and opportunity for education reviewed. Patient voices understanding of discharge instructions with no further questions. Patient ambulatory with steady gait to lobby.

## 2024-09-10 NOTE — Telephone Encounter (Signed)
 FYI Only or Action Required?: FYI only for provider: ED advised.  Patient was last seen in primary care on 06/14/2024 by Antonio Meth, [name], DO.  Called Nurse Triage reporting Hand Injury.  Symptoms began today.  Triage Disposition: Go to ED Now (Notify PCP)  Patient/caregiver understands and will follow disposition?: Yes         Copied from CRM [id number]. Topic: Clinical - Red Word Triage >> Sep 10, 2024  6:01 PM Ashley R wrote: [facility] that prompted transfer to Nurse Triage: hand closed in door. Turning Purple. diabetic Reason for Disposition  [1] Bleeding AND [2] won't stop after 10 minutes of direct pressure (using correct technique)  Answer Assessment - Initial Assessment Questions This RN recommends pt goes to ED and has another adult drive pt there. Pt agreeable.  Closed left ring finger in door 1.5 hour ago Bleeding just a little bit, fingernail is loose from finger  Pt is a diabetic   SEVERITY: Can you use your hand normally? Can you bend your fingers into a ball and then fully open them?     Can bend it but is sore  PAIN: How bad is the pain? (Scale 0-10; or none, mild, moderate, severe)     9/10 pain level  OTHER SYMPTOMS: Do you have any other symptoms?      Purple in color, last two joints  Protocols used: Hand Injury-A-AH

## 2024-09-10 NOTE — ED Triage Notes (Signed)
 Reports shutting L hand in car door. Nail bed lifted on L ring finger. Wrapped in triage

## 2024-09-10 NOTE — Discharge Instructions (Addendum)
 Your x-rays today shows that you have a fracture of your left ring finger bone.  You also had some trapped blood under your nailbed which was released by making a small hole in the nailbed.  Please allow the nail to grow out.  Do not submerge your finger in water (baths or swimming) until he open part of your nailbed has fully grown out. You can shower and wash your hands.   You have been placed into a splint to hold the fracture in place.  Please keep this splint clean and dry.  Wrap the splint with a garbage bag or Saran wrap to keep it dry while showering or bathing.  Do not take off the splint.   You may take up to 1000mg  of tylenol  every 6 hours as needed for pain. Do not take more then 4g per day.  You were given your first dose of Tylenol  here today.  Your next dose can be no sooner than 1:30 AM.   You may use up to 600mg  ibuprofen  every 6 hours as needed for pain.  Do not exceed 2.4g of ibuprofen  per day.  You may alternate these medications for complete coverage. See instructions below: Take 600mg  ibuprofen , then 3 hours later take 1000mg  tylenol , then 3 hours later 600mg  ibuprofen , then 3 hours later 1000mg  tylenol , so on and so forth for pain control.    You have been prescribed Oxycodone -this is a narcotic/controlled substance medication that has potential addicting qualities.  You may take 1 tablet every 6 hours as needed for severe pain not controlled with tylenol  and ibuprofen .  You were given your first dose here today.  Do not drive or operate heavy machinery when taking this medicine as it can be sedating. Do not drink alcohol or take other sedating medications when taking this medicine for safety reasons.  Keep this out of reach of small children.     Call the orthopedics office listed below (Dr. Alyse) to schedule a follow-up appointment within the next week  Return to the ER for any uncontrolled pain, numbness or tingling, discoloration, any other new or concerning  symptoms.

## 2024-09-11 ENCOUNTER — Other Ambulatory Visit (HOSPITAL_COMMUNITY): Payer: Self-pay

## 2024-09-11 ENCOUNTER — Other Ambulatory Visit: Payer: Self-pay | Admitting: Family Medicine

## 2024-09-11 DIAGNOSIS — E1165 Type 2 diabetes mellitus with hyperglycemia: Secondary | ICD-10-CM

## 2024-09-11 MED ORDER — RYBELSUS 14 MG PO TABS
1.0000 | ORAL_TABLET | Freq: Every day | ORAL | 1 refills | Status: AC
  Filled 2024-09-11: qty 30, 30d supply, fill #0
  Filled 2024-10-19: qty 30, 30d supply, fill #1
  Filled 2024-11-20: qty 30, 30d supply, fill #2

## 2024-09-11 MED ORDER — EMPAGLIFLOZIN 10 MG PO TABS
10.0000 mg | ORAL_TABLET | Freq: Every day | ORAL | 1 refills | Status: AC
  Filled 2024-09-11: qty 90, 90d supply, fill #0
  Filled 2024-11-29: qty 90, 90d supply, fill #1

## 2024-09-11 MED ORDER — BLOOD GLUCOSE TEST VI STRP
ORAL_STRIP | 12 refills | Status: AC
  Filled 2024-09-11: qty 200, 30d supply, fill #0

## 2024-09-11 NOTE — Telephone Encounter (Signed)
 Pt seen at ED

## 2024-10-19 ENCOUNTER — Other Ambulatory Visit (HOSPITAL_COMMUNITY): Payer: Self-pay

## 2024-10-19 MED FILL — Losartan Potassium Tab 50 MG: ORAL | 90 days supply | Qty: 90 | Fill #2 | Status: AC

## 2024-11-20 ENCOUNTER — Other Ambulatory Visit (HOSPITAL_COMMUNITY): Payer: Self-pay

## 2024-11-29 ENCOUNTER — Other Ambulatory Visit (HOSPITAL_COMMUNITY): Payer: Self-pay

## 2024-12-04 ENCOUNTER — Other Ambulatory Visit: Payer: Self-pay | Admitting: Family Medicine

## 2024-12-04 ENCOUNTER — Other Ambulatory Visit (HOSPITAL_COMMUNITY): Payer: Self-pay

## 2024-12-04 DIAGNOSIS — E1165 Type 2 diabetes mellitus with hyperglycemia: Secondary | ICD-10-CM

## 2024-12-04 MED ORDER — METFORMIN HCL ER 500 MG PO TB24
1000.0000 mg | ORAL_TABLET | Freq: Two times a day (BID) | ORAL | 1 refills | Status: AC
  Filled 2024-12-07: qty 360, 90d supply, fill #0

## 2024-12-07 ENCOUNTER — Other Ambulatory Visit (HOSPITAL_COMMUNITY): Payer: Self-pay

## 2024-12-20 ENCOUNTER — Encounter: Admitting: Family Medicine

## 2025-03-26 ENCOUNTER — Ambulatory Visit

## 2025-03-27 ENCOUNTER — Ambulatory Visit
